# Patient Record
Sex: Male | Born: 1965 | Race: Black or African American | Hispanic: No | Marital: Married | Smoking: Current some day smoker
Health system: Southern US, Community
[De-identification: ages and names within clinical notes are randomized; demographics above are authoritative.]

## PROBLEM LIST (undated history)

## (undated) DIAGNOSIS — Z720 Tobacco use: Secondary | ICD-10-CM

## (undated) DIAGNOSIS — Z7901 Long term (current) use of anticoagulants: Secondary | ICD-10-CM

## (undated) DIAGNOSIS — I472 Ventricular tachycardia, unspecified: Secondary | ICD-10-CM

## (undated) DIAGNOSIS — Z95810 Presence of automatic (implantable) cardiac defibrillator: Secondary | ICD-10-CM

## (undated) DIAGNOSIS — F1010 Alcohol abuse, uncomplicated: Secondary | ICD-10-CM

## (undated) DIAGNOSIS — M109 Gout, unspecified: Secondary | ICD-10-CM

## (undated) DIAGNOSIS — I10 Essential (primary) hypertension: Secondary | ICD-10-CM

## (undated) DIAGNOSIS — I509 Heart failure, unspecified: Secondary | ICD-10-CM

## (undated) HISTORY — DX: Essential (primary) hypertension: I10

## (undated) HISTORY — DX: Ventricular tachycardia: I47.2

## (undated) HISTORY — DX: Ventricular tachycardia, unspecified: I47.20

## (undated) HISTORY — DX: Tobacco use: Z72.0

## (undated) HISTORY — DX: Presence of automatic (implantable) cardiac defibrillator: Z95.810

## (undated) HISTORY — DX: Alcohol abuse, uncomplicated: F10.10

---

## 1998-12-14 ENCOUNTER — Emergency Department (HOSPITAL_COMMUNITY): Admission: EM | Admit: 1998-12-14 | Discharge: 1998-12-14 | Payer: Self-pay | Admitting: Emergency Medicine

## 1998-12-26 ENCOUNTER — Emergency Department (HOSPITAL_COMMUNITY): Admission: EM | Admit: 1998-12-26 | Discharge: 1998-12-26 | Payer: Self-pay | Admitting: Emergency Medicine

## 2007-08-18 ENCOUNTER — Inpatient Hospital Stay (HOSPITAL_COMMUNITY): Admission: EM | Admit: 2007-08-18 | Discharge: 2007-08-28 | Payer: Self-pay | Admitting: Emergency Medicine

## 2007-08-18 ENCOUNTER — Ambulatory Visit: Payer: Self-pay | Admitting: Internal Medicine

## 2007-08-18 ENCOUNTER — Encounter (INDEPENDENT_AMBULATORY_CARE_PROVIDER_SITE_OTHER): Payer: Self-pay | Admitting: Emergency Medicine

## 2007-08-21 ENCOUNTER — Encounter: Payer: Self-pay | Admitting: Internal Medicine

## 2007-08-28 ENCOUNTER — Encounter: Payer: Self-pay | Admitting: Internal Medicine

## 2007-09-17 ENCOUNTER — Encounter: Payer: Self-pay | Admitting: Internal Medicine

## 2008-11-26 HISTORY — PX: CARDIAC DEFIBRILLATOR PLACEMENT: SHX171

## 2008-12-23 IMAGING — CT CT ANGIO CHEST
3 of 4 series · 19 of 29 positions shown · IV contrast (100 ML OMNI 300)
Comparison: none

CLINICAL DATA: Shortness of breath.  Elevated d-dimer.  
CT ANGIOGRAPHY OF CHEST:
TECHNIQUE: Multidetector CT imaging of the chest was performed during bolus injection of intravenous contrast.  Multiplanar CT angiographic image reconstructions were generated to evaluate the vascular anatomy.
Contrast:  100 cc Omnipaque 300

[Series 4: recon 2: pe · axial · 0.70mm/px · z∈[-230,-58]mm · 6 of 105 slices shown]
[im 18/105  lung]
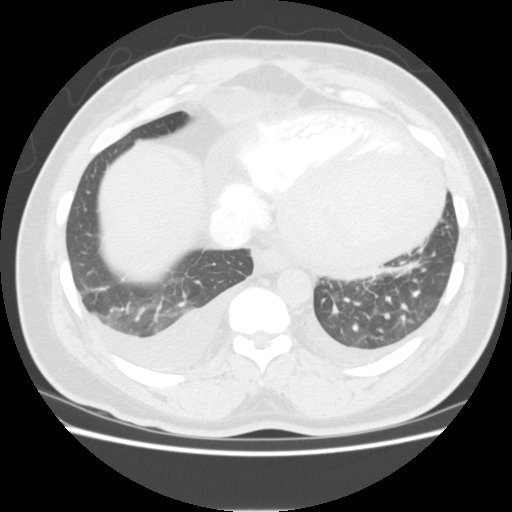
[im 35/105  mediastinal]
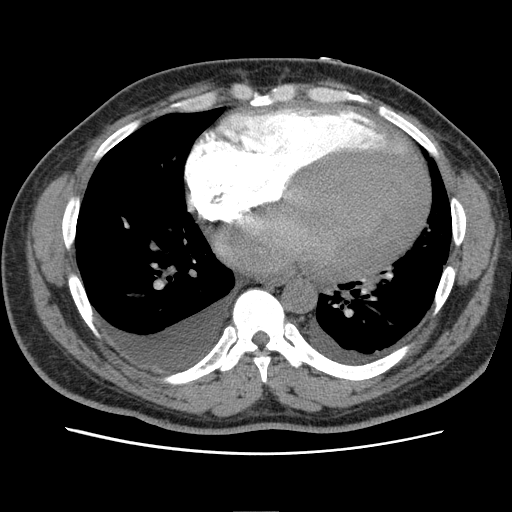
[im 53/105  lung]
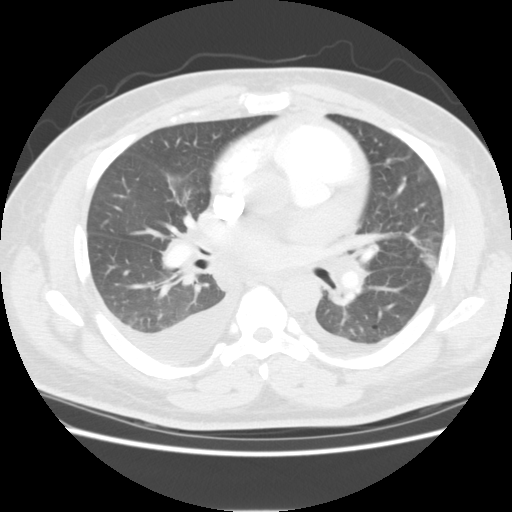
[im 54/105  mediastinal]
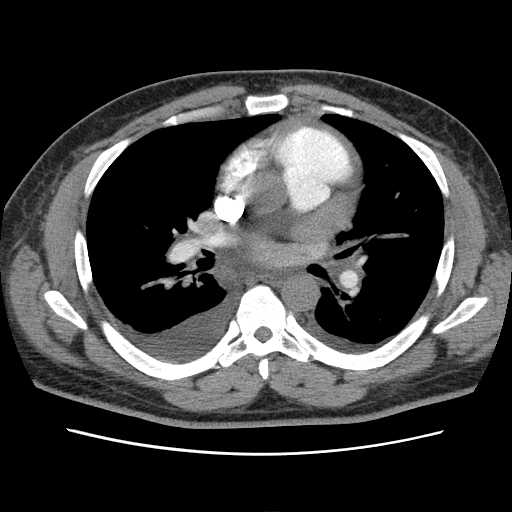
[im 70/105  lung]
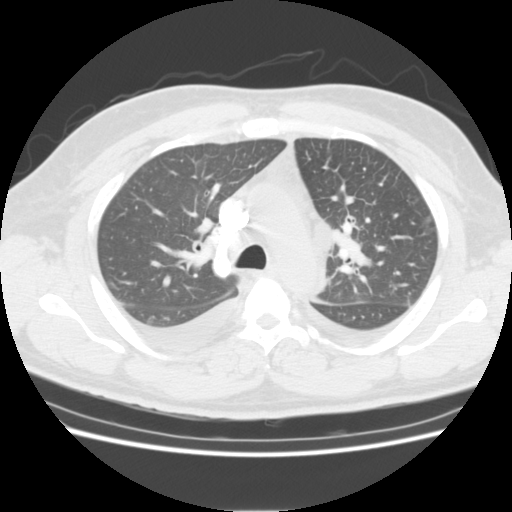
[im 87/105  mediastinal]
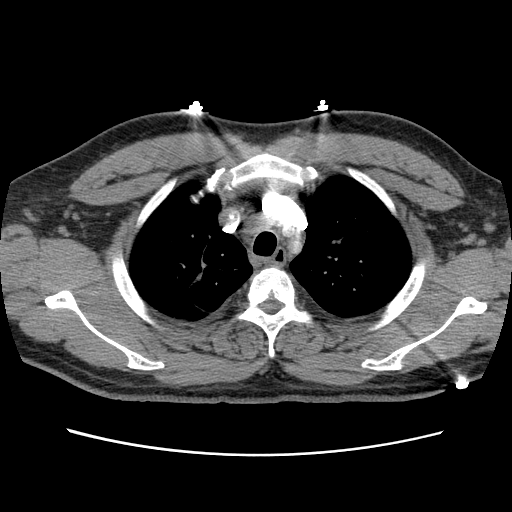

[Series 300: sag chest · sagittal · 0.66mm/px · 8 of 150 slices shown]
[im 15/150  lung]
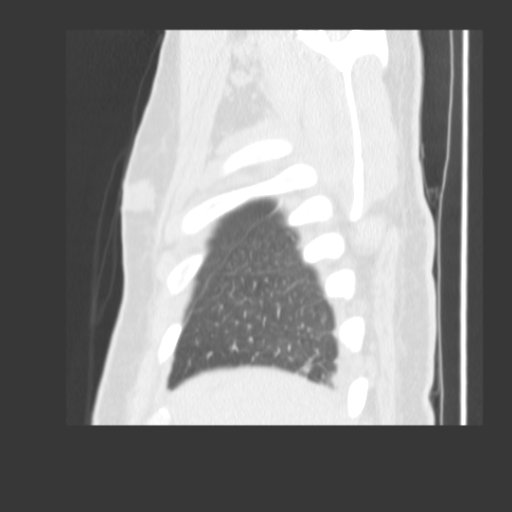
[im 30/150  lung]
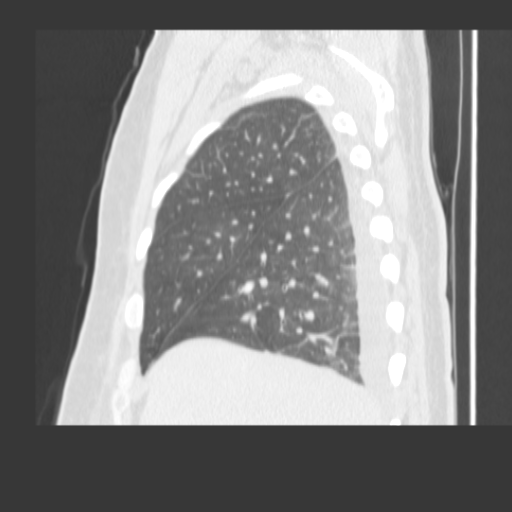
[im 45/150  lung]
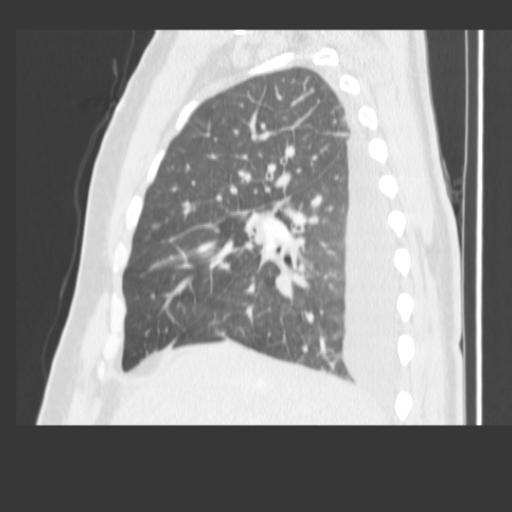
[im 60/150  lung]
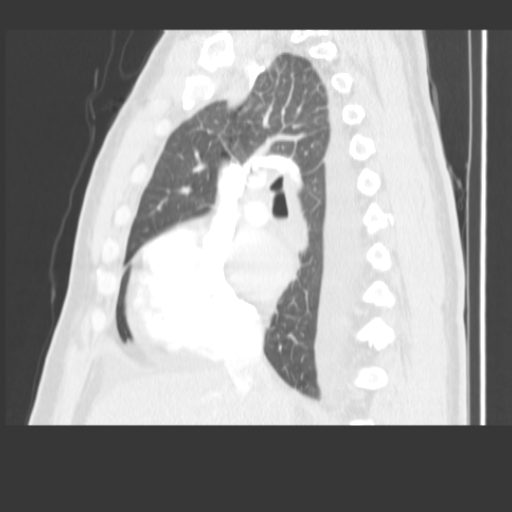
[im 90/150  lung]
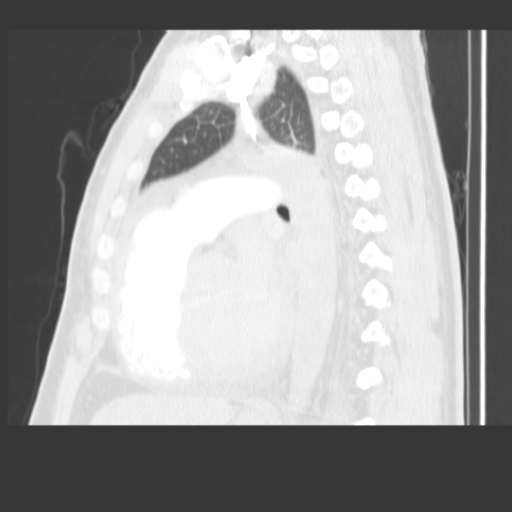
[im 105/150  lung]
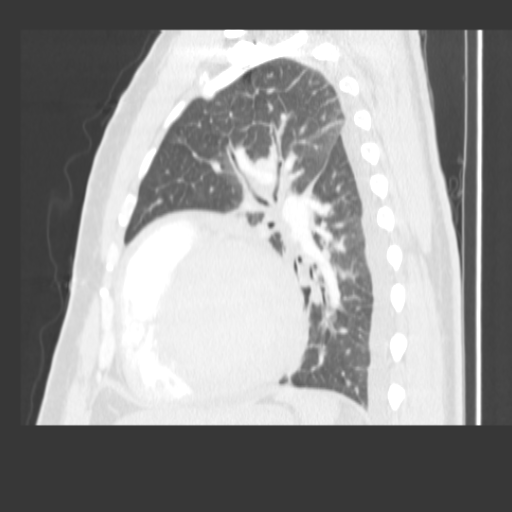
[im 120/150  lung]
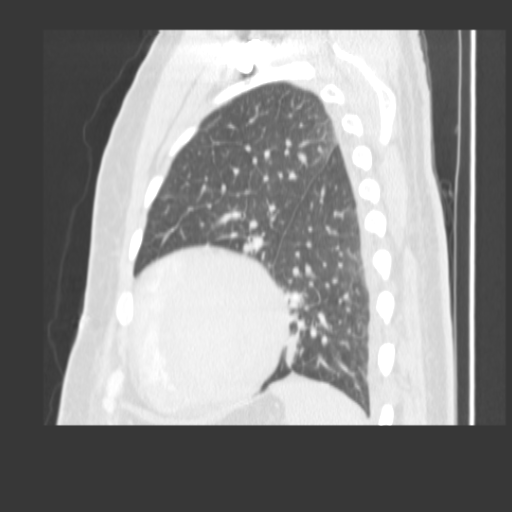
[im 135/150  lung]
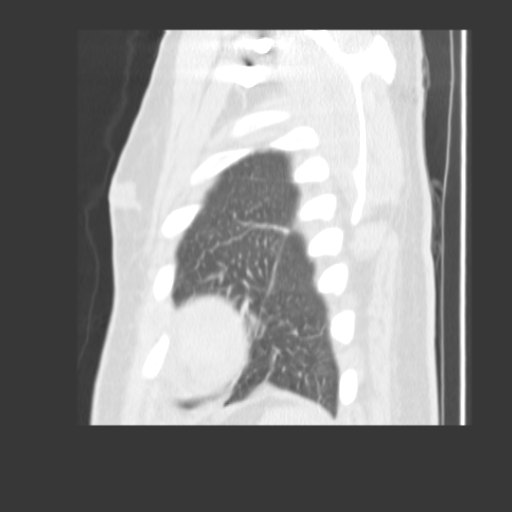

[Series 301: cor chestt · coronal · 0.66mm/px · 5 of 119 slices shown]
[im 15/119  lung]
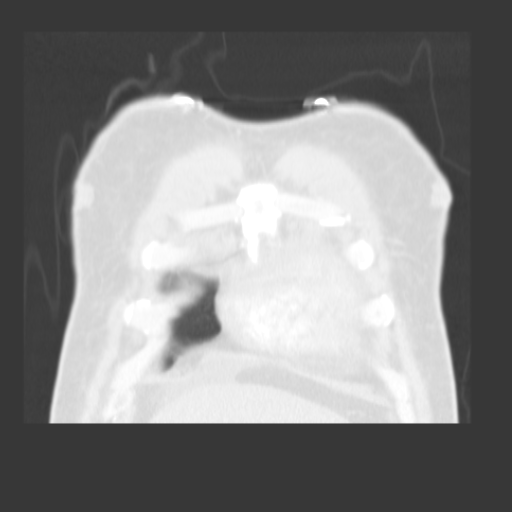
[im 30/119  lung]
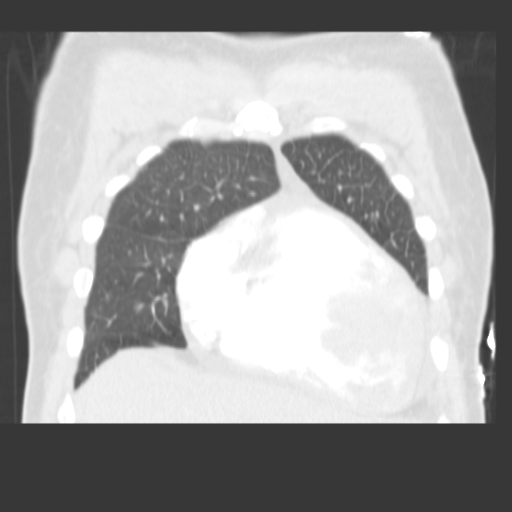
[im 45/119  lung]
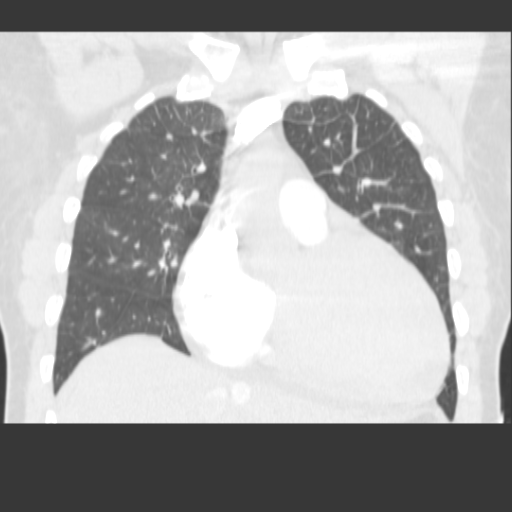
[im 60/119  lung]
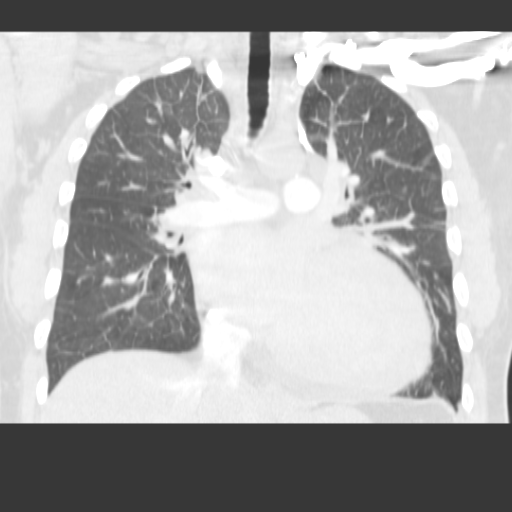
[im 74/119  lung]
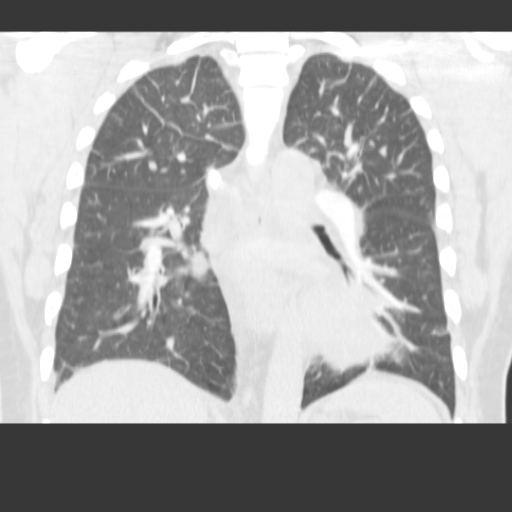

[19 of 29 positions shown; findings below may reference images not displayed]

FINDINGS: There is no CT evidence of pulmonary embolus.  The patient has small to moderate bilateral pleural effusions right greater than left.  No pericardial effusion is identified.  Marked cardiomegaly is noted.  There is gynecomastia.  The patient has some small mediastinal lymph nodes including a prevascular node on image 37 measuring 1.1 by approximately 2.0 cm.  There is also a precarinal lymph node on image 39 measuring 2.1 x 1.4 cm.  Right hilar lymph node measures 2.3 x 2.2 cm.  Lungs demonstrate patchy ground-glass attenuation with intralobular septal thickening consistent with interstitial pulmonary edema. No pulmonary nodule or mass is identified.  Infiltration of the subcutaneous fat diffusely is compatible with volume overload.  
Incidentally imaged upper abdomen demonstrates a small amount of perihepatic fluid.   Limited visualization of the upper abdomen is otherwise unremarkable.  Some contrast material is seen refluxing into the inferior vena cava suggesting elevated right heart pressures.  There is no focal bony abnormality.
IMPRESSION: 1.  Negative for pulmonary embolus.  
2.  Cardiomegaly, bilateral pleural effusions and pulmonary edema.  
3.  Small amount of perihepatic ascites cannot be definitively characterized.  
4.  Mediastinal and hilar lymph nodes are noted.   These may be reactive in nature but cannot be definitively characterized.  Question sarcoid or lymphoproliferative process.  
5.  Small amount of perihepatic ascites.  
6.  Gynecomastia.

## 2009-06-22 ENCOUNTER — Encounter: Payer: Self-pay | Admitting: Internal Medicine

## 2009-06-24 ENCOUNTER — Encounter: Payer: Self-pay | Admitting: Internal Medicine

## 2009-07-04 ENCOUNTER — Encounter: Payer: Self-pay | Admitting: Internal Medicine

## 2009-07-29 DIAGNOSIS — I10 Essential (primary) hypertension: Secondary | ICD-10-CM | POA: Insufficient documentation

## 2009-07-29 DIAGNOSIS — I472 Ventricular tachycardia: Secondary | ICD-10-CM

## 2009-07-29 DIAGNOSIS — F1021 Alcohol dependence, in remission: Secondary | ICD-10-CM | POA: Insufficient documentation

## 2009-07-29 DIAGNOSIS — I429 Cardiomyopathy, unspecified: Secondary | ICD-10-CM | POA: Insufficient documentation

## 2009-07-29 DIAGNOSIS — F17200 Nicotine dependence, unspecified, uncomplicated: Secondary | ICD-10-CM

## 2009-07-29 DIAGNOSIS — I509 Heart failure, unspecified: Secondary | ICD-10-CM | POA: Insufficient documentation

## 2009-07-29 DIAGNOSIS — I4891 Unspecified atrial fibrillation: Secondary | ICD-10-CM | POA: Insufficient documentation

## 2009-08-02 ENCOUNTER — Ambulatory Visit: Payer: Self-pay | Admitting: Internal Medicine

## 2009-08-11 ENCOUNTER — Ambulatory Visit: Payer: Self-pay | Admitting: Internal Medicine

## 2009-08-12 LAB — CONVERTED CEMR LAB
Basophils Absolute: 0.1 10*3/uL (ref 0.0–0.1)
Chloride: 108 meq/L (ref 96–112)
Creatinine, Ser: 1.2 mg/dL (ref 0.4–1.5)
Eosinophils Absolute: 0.2 10*3/uL (ref 0.0–0.7)
Glucose, Bld: 117 mg/dL — ABNORMAL HIGH (ref 70–99)
Hemoglobin: 14.3 g/dL (ref 13.0–17.0)
INR: 1.4 — ABNORMAL HIGH (ref 0.8–1.0)
Lymphocytes Relative: 29.6 % (ref 12.0–46.0)
Lymphs Abs: 1.8 10*3/uL (ref 0.7–4.0)
MCHC: 34.3 g/dL (ref 30.0–36.0)
Neutro Abs: 3.6 10*3/uL (ref 1.4–7.7)
Prothrombin Time: 14.7 s — ABNORMAL HIGH (ref 9.1–11.7)
RBC: 4.6 M/uL (ref 4.22–5.81)
RDW: 14.3 % (ref 11.5–14.6)
Sodium: 144 meq/L (ref 135–145)
WBC: 6.1 10*3/uL (ref 4.5–10.5)

## 2009-08-18 ENCOUNTER — Observation Stay (HOSPITAL_COMMUNITY): Admission: RE | Admit: 2009-08-18 | Discharge: 2009-08-19 | Payer: Self-pay | Admitting: Internal Medicine

## 2009-08-18 ENCOUNTER — Ambulatory Visit: Payer: Self-pay | Admitting: Internal Medicine

## 2009-08-19 ENCOUNTER — Encounter: Payer: Self-pay | Admitting: Internal Medicine

## 2009-08-25 ENCOUNTER — Encounter: Payer: Self-pay | Admitting: Internal Medicine

## 2009-08-31 ENCOUNTER — Ambulatory Visit: Payer: Self-pay

## 2009-08-31 ENCOUNTER — Ambulatory Visit: Payer: Self-pay | Admitting: Internal Medicine

## 2009-09-02 ENCOUNTER — Telehealth (INDEPENDENT_AMBULATORY_CARE_PROVIDER_SITE_OTHER): Payer: Self-pay | Admitting: *Deleted

## 2009-10-06 ENCOUNTER — Encounter (INDEPENDENT_AMBULATORY_CARE_PROVIDER_SITE_OTHER): Payer: Self-pay | Admitting: *Deleted

## 2009-10-11 ENCOUNTER — Encounter: Payer: Self-pay | Admitting: Internal Medicine

## 2009-11-15 ENCOUNTER — Inpatient Hospital Stay (HOSPITAL_COMMUNITY): Admission: EM | Admit: 2009-11-15 | Discharge: 2009-11-18 | Payer: Self-pay | Admitting: Emergency Medicine

## 2009-12-01 ENCOUNTER — Ambulatory Visit: Payer: Self-pay | Admitting: Internal Medicine

## 2009-12-01 ENCOUNTER — Encounter: Payer: Self-pay | Admitting: Internal Medicine

## 2009-12-13 ENCOUNTER — Telehealth: Payer: Self-pay | Admitting: Internal Medicine

## 2010-03-09 ENCOUNTER — Ambulatory Visit: Payer: Self-pay | Admitting: Internal Medicine

## 2010-07-11 ENCOUNTER — Inpatient Hospital Stay (HOSPITAL_COMMUNITY): Admission: EM | Admit: 2010-07-11 | Discharge: 2010-07-17 | Payer: Self-pay | Admitting: Emergency Medicine

## 2010-07-12 ENCOUNTER — Encounter (INDEPENDENT_AMBULATORY_CARE_PROVIDER_SITE_OTHER): Payer: Self-pay | Admitting: Cardiology

## 2010-07-17 ENCOUNTER — Encounter: Payer: Self-pay | Admitting: Internal Medicine

## 2010-07-21 ENCOUNTER — Encounter (INDEPENDENT_AMBULATORY_CARE_PROVIDER_SITE_OTHER): Payer: Self-pay | Admitting: *Deleted

## 2010-08-13 ENCOUNTER — Inpatient Hospital Stay (HOSPITAL_COMMUNITY): Admission: EM | Admit: 2010-08-13 | Discharge: 2010-08-18 | Payer: Self-pay | Admitting: Emergency Medicine

## 2010-09-14 ENCOUNTER — Ambulatory Visit: Payer: Self-pay | Admitting: Internal Medicine

## 2010-11-22 ENCOUNTER — Inpatient Hospital Stay (HOSPITAL_COMMUNITY)
Admission: EM | Admit: 2010-11-22 | Discharge: 2010-11-24 | Payer: Self-pay | Source: Home / Self Care | Attending: Cardiology | Admitting: Cardiology

## 2010-12-25 IMAGING — CR DG CHEST 2V
2 series · 2 of 2 positions shown · non-contrast
Comparison: 08/18/2007

CLINICAL DATA: Post ICD

CHEST - 2 VIEW

[w chest pa]
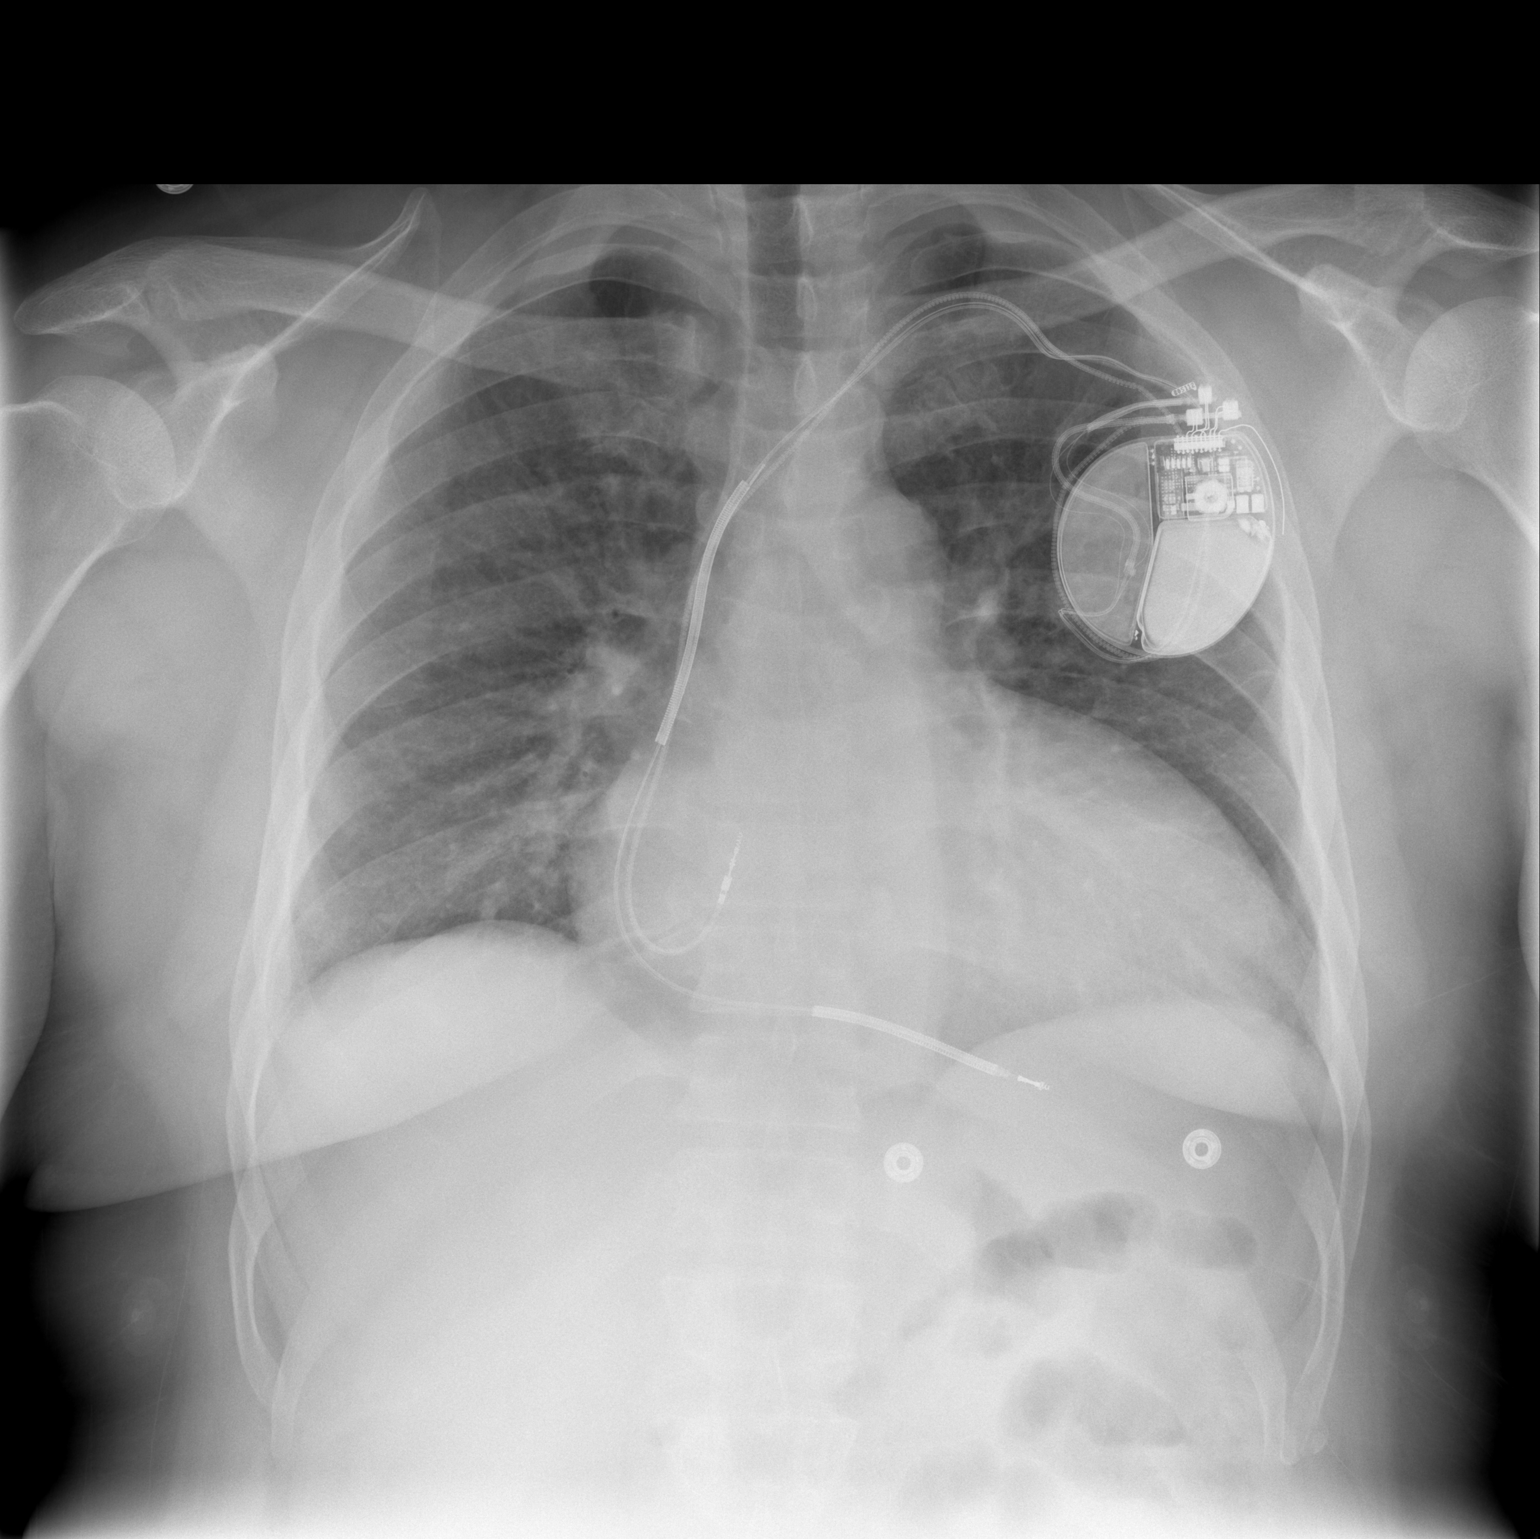

[w chest lat]
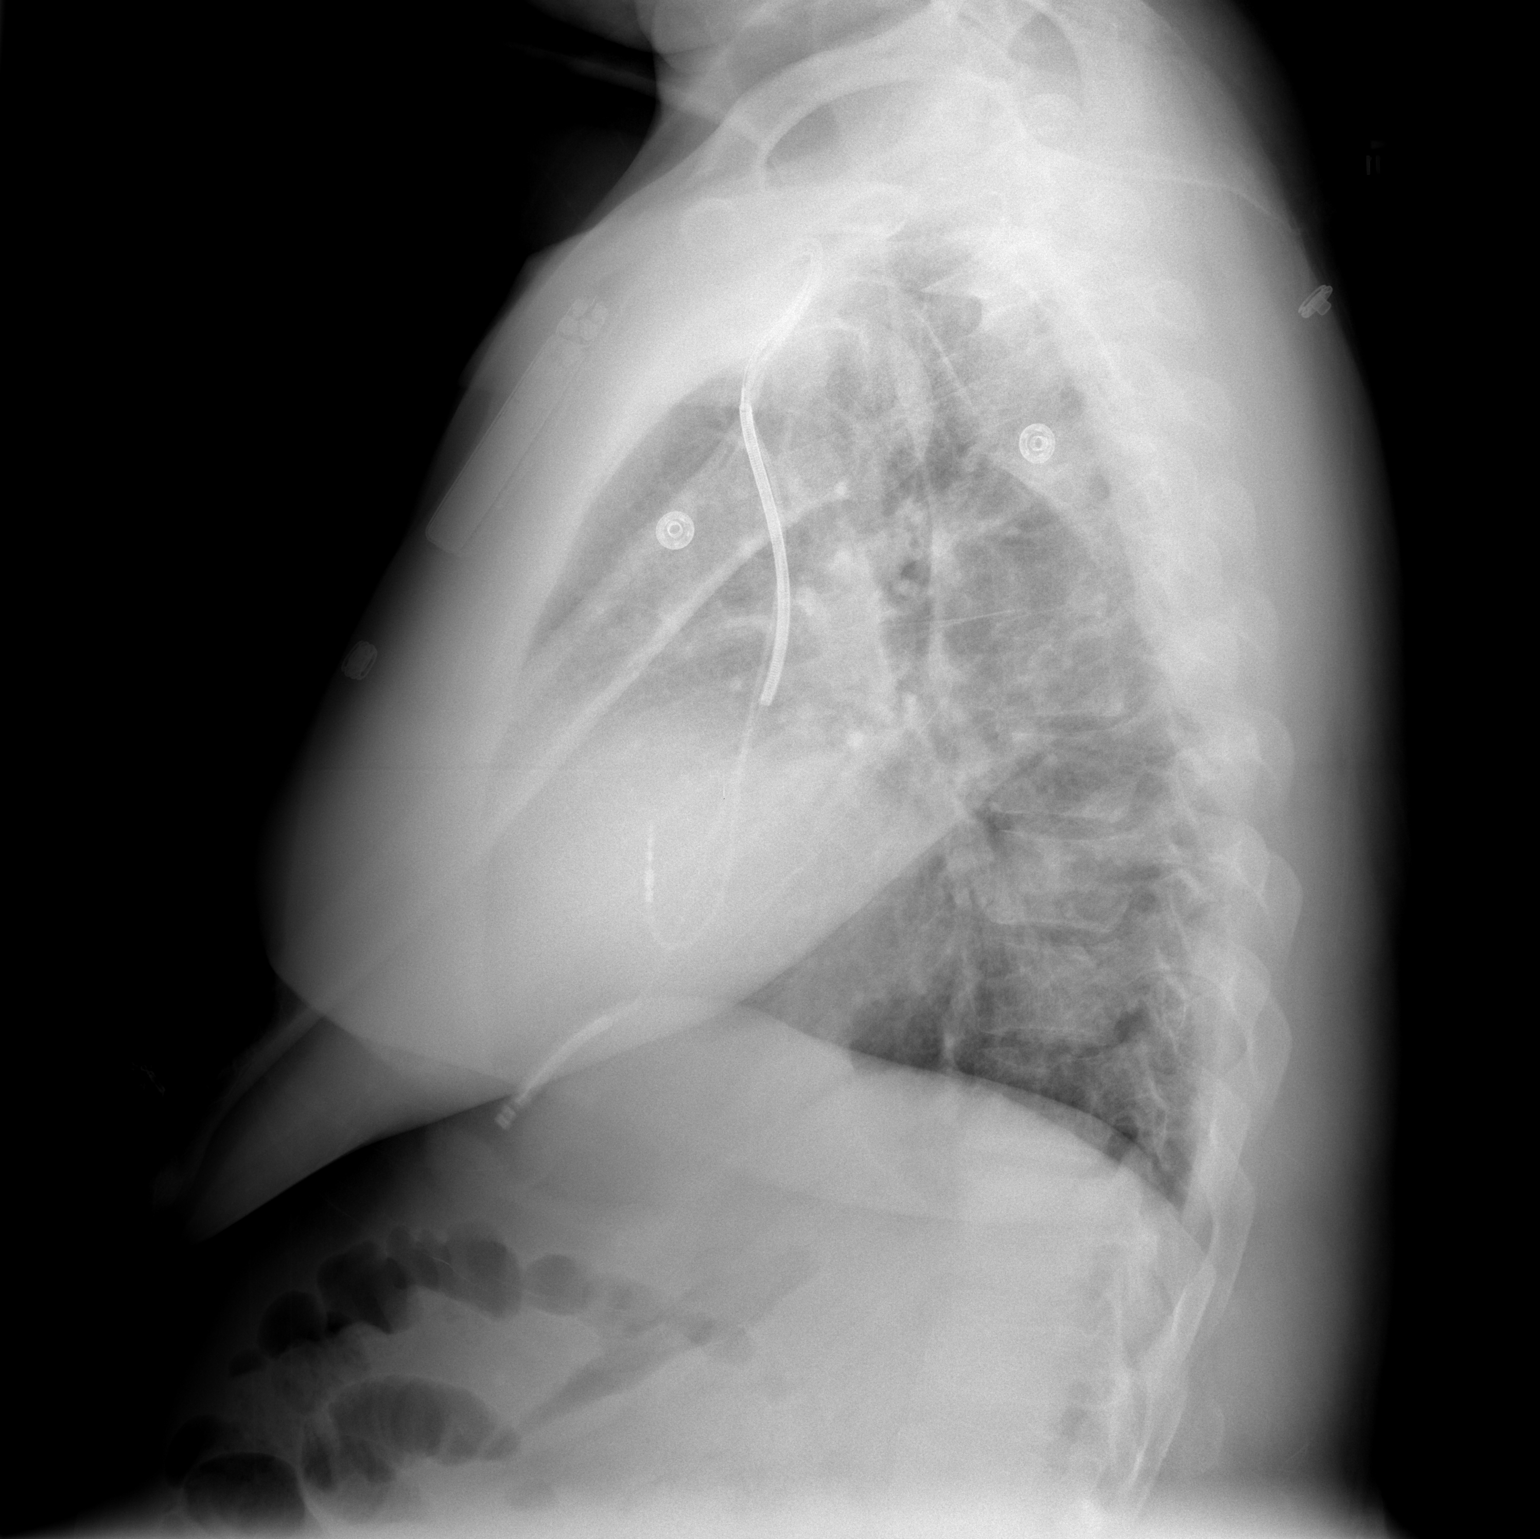

[2 of 2 positions shown; findings below may reference images not displayed]

FINDINGS: Cardiomegaly again noted.  There is a dual lead cardiac
pacemaker with left subclavian approach with leads in the right
atrium and right ventricle.  No diagnostic pneumothorax. No acute
infiltrate or edema.
IMPRESSION: No active disease.  Dual lead cardiac pacemaker in place.  No
diagnostic pneumothorax.

## 2010-12-26 NOTE — Letter (Signed)
Summary: [facility]   Imported By: Roderic Ovens 07/28/2010 15:58:16  _____________________________________________________________________  External Attachment:    Type:   Image     Comment:   External Document

## 2010-12-26 NOTE — Procedures (Signed)
Summary: [facility]   Allergies: No Known Drug Allergies   ICD Specifications Following MD:  Sherryl Manges, MD     ICD Vendor:  [facility] [employer]     ICD Model Number:  E1100     ICD Serial Number:  [id number] ICD DOI:  08/18/2009     ICD Implanting MD:  Sherryl Manges, MD  Lead 1:    Location: RA     DOI: 08/18/2009     Model #: [id number]     Serial #: [id number]     Status: active Lead 2:    Location: RV     DOI: 08/18/2009     Model #: [id number]     Serial #: [id number]     Status: active  Indications::  NICM   ICD Follow Up Remote Check?  No Charge Time:  8.4 seconds     Battery Est. Longevity:  8.5 years   ICD Device Measurements Atrium:  Amplitude: 6.0 mV, Impedance: 615 ohms, Threshold: 0.5 V at 0.4 msec Right Ventricle:  Amplitude: 25 mV, Impedance: 535 ohms, Threshold: 0.7 V at 0.4 msec Shock Impedance: 54 ohms   Episodes MS Episodes:  0     Percent Mode Switch:  0     Shock:  0     ATP:  1     Nonsustained:  15     Atrial Pacing:  0%     Ventricular Pacing:  0%  Brady Parameters Mode DDD     Lower Rate Limit:  40     Upper Rate Limit 120 PAV 250     Sensed AV Delay:  250  Tachy Zones VF:  200     VT:  170     Tech Comments:  No parameter changes.  Research visit.  Checked by [employer]. Altha Harm, LPN  March 09, 2010 4:12 PM

## 2010-12-26 NOTE — Letter (Signed)
**Note De-Identified  Obfuscation** Summary: Appointment - Missed  [facility], Main Office  [address]   [place], [place] [address]   Phone: [phone]  Fax: [phone]     July 21, 2010 MRN: [id number]   Parker Johnson [address] [place], [place]  [address]   Dear Parker Johnson,  Our records indicate you missed your appointment on  August 23rd, 2011, with Dr. Graciela Husbands. It is very important that we reach you to reschedule this appointment. We look forward to participating in your health care needs. Please contact us at the number listed above at your earliest convenience to reschedule this appointment.     Sincerely,    Glass blower/designer

## 2010-12-26 NOTE — Cardiovascular Report (Signed)
Summary: ICD Industry Check  ICD Industry Check   Imported By: Roderic Ovens 12/15/2009 15:39:15  _____________________________________________________________________  External Attachment:    Type:   Image     Comment:   External Document

## 2010-12-26 NOTE — Assessment & Plan Note (Signed)
Summary: rov/ gd   Visit Type:  rov Referring Provider:  Harwani  CC:  shortness of breath.  History of Present Illness: Parker Johnson is seen  in followup for ICD implantation  for  congestive heart failure and nonischemic cardiomyopathy.  He is part of the MADIT-R. IT trial     catheterization 2 years ago demonstrating no obstructive disease; recent echo cardiogram demonstrated ejection fraction of 15%. He has a narrow QRS also received a dual-chamber device without left ventricular resynchronization  The patient denies SOB, chest pain, edema or palpitations.  he was recently hospitalized for congestive heart failure his diuretic dose was adjusted.  He is on triple anticoagulant therapy for reasons that are not clear to me and not clear to the patient   Current Medications (verified): 1)  Coumadin 10 Mg Tabs (Warfarin Sodium) .... One Tab Once Taily 2)  Lisinopril 20 Mg Tabs (Lisinopril) .... One Tab Every Day 3)  Carvedilol 6.25 Mg Tabs (Carvedilol) .... One Tab Every 12 Hours 4)  Spironolactone 25 Mg Tabs (Spironolactone) .... One Tablet Two Times A Day 5)  Furosemide 40 Mg Tabs (Furosemide) .... Two Times A Day 6)  Plavix 75 Mg Tabs (Clopidogrel Bisulfate) .... Take One Tablet By Mouth Daily 7)  Aspirin Ec 325 Mg Tbec (Aspirin) .... Take One Tablet By Mouth Daily  Allergies (verified): No Known Drug Allergies  Past History:  Past Medical History: Last updated: 11/30/2009 VENTRICULAR TACHYCARDIA (ICD-427.1)  AICD PLACEMENT- [employer] TELIGEN 100 TOBACCO ABUSE (ICD-305.1) ALCOHOL ABUSE, HX OF (ICD-V11.3) CARDIOMYOPATHY, SECONDARY (ICD-425.9) HYPERTENSION, UNCONTROLLED (ICD-401.9)    Past Surgical History: Last updated: 11/30/2009 AICD Placement/2010-- [employer] TELIGEN 100  Family History: Last updated: 07/29/2009  His family history is notable for his father having a cardiomyopathy, on  a defibrillator. We do not know about his other siblings, including  two  brothers and a sister.      Social History: Last updated: 08/02/2009 His related medical history is notable for polysubstance abuse, with   heavy alcohol use even following his catheterization last summer, with a  significant downturn in frequency about 6 months ago.  There is also a  history of cocaine use that is not very recent.     Disabled  Single  did not finish the 12th grade but is interested in pursuing a GED  Vital Signs:  Patient profile:   45 year old male Height:      67 inches Weight:      193 pounds BMI:     30.34 Pulse rate:   93 / minute BP sitting:   102 / 79  (right arm) Cuff size:   large  Vitals Entered By: Caralee Ates CMA (September 14, 2010 3:24 PM)  Physical Exam  General:  The patient was alert and oriented in no acute distress. HEENT Normal.  Neck veins were flat, carotids were brisk.  Lungs were clear.  Heart sounds were regular without murmurs or gallops.  Abdomen was soft with active bowel sounds. There is no clubbing cyanosis or edema. Skin Warm and dry     ICD Specifications Following MD:  Sherryl Manges, MD     ICD Vendor:  [facility] [employer]     ICD Model Number:  E1100     ICD Serial Number:  [id number] ICD DOI:  08/18/2009     ICD Implanting MD:  Sherryl Manges, MD  Lead 1:    Location: RA     DOI: 08/18/2009  Model #: [id number]     Serial #: [id number]     Status: active Lead 2:    Location: RV     DOI: 08/18/2009     Model #: 2952     Serial #: [id number]     Status: active  Indications::  NICM   ICD Follow Up Battery Voltage:  BOL V     Charge Time:  8.5 seconds     Battery Est. Longevity:  8.5 YRS Underlying rhythm:  SR @ 90   ICD Device Measurements Atrium:  Amplitude: 4.7 mV, Impedance: 604 ohms, Threshold: 0.4 V at 0.4 msec Right Ventricle:  Amplitude: 25.0 mV, Impedance: 540 ohms, Threshold: 0.4 V at 0.4 msec Shock Impedance: 56 ohms   Episodes MS Episodes:  0     Shock:  0     ATP:  4     Nonsustained:  180     Atrial Pacing:   0     Ventricular Pacing:  0  Brady Parameters Mode DDD     Lower Rate Limit:  40     Upper Rate Limit 120 PAV 350     Sensed AV Delay:  350  Tachy Zones VF:  200     VT:  170     Next Cardiology Appt Due:  11/27/2010 Tech Comments:  MADIT RIT PT. CHECKED BY INDUSTRY.  4 EPISODES SUCCESSFULLY TERMINATED W/ATP THERAPY.  NORMAL DEVICE FUNCTION. NO CHANGES MADE. ROV IN 3 MTHS. Vella Kohler  September 14, 2010 3:23 PM  Impression & Recommendations:  Problem # 1:  IMPLANTATION OF DEFIBRILLATOR, BS TELIGEN 100 (ICD-V45.02) Device parameters and data were reviewed and no changes were made  Problem # 2:  VENTRICULAR TACHYCARDIA (ICD-427.1) the patient continues to have treated episodes of ventricular tachycardia successfully terminated with ATP His updated medication list for this problem includes:    Coumadin 10 Mg Tabs (Warfarin sodium) ..... One tab once taily    Lisinopril 20 Mg Tabs (Lisinopril) ..... One tab every day    Carvedilol 6.25 Mg Tabs (Carvedilol) ..... One tab every 12 hours    Plavix 75 Mg Tabs (Clopidogrel bisulfate) .Marland Kitchen... Take one tablet by mouth daily    Aspirin Ec 325 Mg Tbec (Aspirin) .Marland Kitchen... Take one tablet by mouth daily  Problem # 3:  PAROXYSMAL ATRIAL FIBRILLATION (ICD-427.31) the patient carries a diagnosis of atrial fibrillation which would explain The Coumadin. We don't have evidence of intercurrent episodes identified by his ICD His updated medication list for this problem includes:    Coumadin 10 Mg Tabs (Warfarin sodium) ..... One tab once taily    Carvedilol 6.25 Mg Tabs (Carvedilol) ..... One tab every 12 hours    Plavix 75 Mg Tabs (Clopidogrel bisulfate) .Marland Kitchen... Take one tablet by mouth daily    Aspirin Ec 325 Mg Tbec (Aspirin) .Marland Kitchen... Take one tablet by mouth daily  Problem # 4:  CARDIOMYOPATHY, SECONDARY (ICD-425.9) on appropriate therapy. The issue I understand is the aspirin and Plavix and Coumadin is understood in the context of the above His updated  medication list for this problem includes:    Coumadin 10 Mg Tabs (Warfarin sodium) ..... One tab once taily    Lisinopril 20 Mg Tabs (Lisinopril) ..... One tab every day    Carvedilol 6.25 Mg Tabs (Carvedilol) ..... One tab every 12 hours    Spironolactone 25 Mg Tabs (Spironolactone) ..... One tablet two times a day    Furosemide 40 Mg Tabs (Furosemide) .Marland Kitchen..Marland Kitchen Two times a  day    Plavix 75 Mg Tabs (Clopidogrel bisulfate) .Marland Kitchen... Take one tablet by mouth daily    Aspirin Ec 325 Mg Tbec (Aspirin) .Marland Kitchen... Take one tablet by mouth daily

## 2010-12-26 NOTE — Progress Notes (Signed)
Summary:  QUESTION ABOUT MEDICATIONS  Phone Note Call from Patient Call back at [phone]   Summary of Call: PT HAVE QUESTION ABOUT MEDICATION Initial call taken by: Judie Grieve,  December 13, 2009 1:31 PM  Follow-up for Phone Call        Pt wants Cialis or Viagra, pt aware Dr. Graciela Husbands is out of the office until tomorrow.  per pt calling back to get response on medication [phone] Lorne Skeens  December 14, 2009 2:12 PM  Follow-up by: Duncan Dull, RN, BSN,  December 13, 2009 2:11 PM  Additional Follow-up for Phone Call Additional follow up Details #1::        Per Dr. Graciela Husbands, he does not prescribe either of these and pt will need to s/w PCP. Additional Follow-up by: Duncan Dull, RN, BSN,  December 14, 2009 7:10 PM     Appended Document: **SK-Cialis/Viagra** QUESTION ABOUT MEDICATIONS Pt aware.

## 2010-12-26 NOTE — Assessment & Plan Note (Signed)
Summary: PC2/MB   Referring Provider:  Sharyn Lull  CC:  pc2/  .  History of Present Illness: Mr. Parker Johnson is seen  in followup for ICD implantation in a state of congestive heart failure and nonischemic cardiomyopathy.  He is part of the MADIT-R. IT trial     catheterization 2 years ago demonstrating no obstructive disease; recent echo cardiogram demonstrated ejection fraction of 15%. He has a narrow QRS also received a dual-chamber device without left ventricular resynchronization  The patient denies SOB, chest pain, edema or palpitations   Current Medications (verified): 1)  Coumadin 10 Mg Tabs (Warfarin Sodium) .... One Tab Once Taily 2)  Lisinopril 20 Mg Tabs (Lisinopril) .... One Tab Every Day 3)  Carvedilol 6.25 Mg Tabs (Carvedilol) .... One Tab Every 12 Hours 4)  Spironolactone 25 Mg Tabs (Spironolactone) .... One Tablet Two Times A Day 5)  Furosemide 40 Mg Tabs (Furosemide) .... One Tab Every Day  Allergies (verified): No Known Drug Allergies  Past History:  Past Medical History: Last updated: 11/30/2009 VENTRICULAR TACHYCARDIA (ICD-427.1)  AICD PLACEMENT- [employer] TELIGEN 100 TOBACCO ABUSE (ICD-305.1) ALCOHOL ABUSE, HX OF (ICD-V11.3) CARDIOMYOPATHY, SECONDARY (ICD-425.9) HYPERTENSION, UNCONTROLLED (ICD-401.9)    Past Surgical History: Last updated: 11/30/2009 AICD Placement/2010-- [employer] TELIGEN 100  Family History: Last updated: 07/29/2009  His family history is notable for his father having a cardiomyopathy, on  a defibrillator. We do not know about his other siblings, including two  brothers and a sister.      Social History: Last updated: 08/02/2009 His related medical history is notable for polysubstance abuse, with   heavy alcohol use even following his catheterization last summer, with a  significant downturn in frequency about 6 months ago.  There is also a  history of cocaine use that is not very recent.     Disabled  Single  did  not finish the 12th grade but is interested in pursuing a GED  Vital Signs:  Patient profile:   45 year old male Height:      67 inches Weight:      224 pounds BMI:     35.21 Pulse rate:   80 / minute Pulse rhythm:   regular BP sitting:   102 / 73  (right arm) Cuff size:   large  Vitals Entered By: Judithe Modest CMA (December 01, 2009 10:59 AM)  Physical Exam  General:  The patient was alert and oriented in no acute distress. HEENT Normal.  Neck veins were flat, carotids were brisk.  Lungs were clear.  Heart sounds were regular without murmurs or gallops.  Abdomen was soft with active bowel sounds. There is no clubbing cyanosis or edema. Skin Warm and dry     ICD Specifications Following MD:  Sherryl Manges, MD     ICD Vendor:  [facility] [employer]     ICD Model Number:  E1100     ICD Serial Number:  [id number] ICD DOI:  08/18/2009     ICD Implanting MD:  Sherryl Manges, MD  Lead 1:    Location: RA     DOI: 08/18/2009     Model #: [id number]     Serial #: [id number]     Status: active Lead 2:    Location: RV     DOI: 08/18/2009     Model #: 2440     Serial #: [id number]     Status: active  Indications::  NICM   Huston Foley Parameters Mode DDD  Lower Rate Limit:  40     Upper Rate Limit 120 PAV 250     Sensed AV Delay:  250  Tachy Zones VF:  200     VT:  170     Impression & Recommendations:  Problem # 1:  IMPLANTATION OF DEFIBRILLATOR, BS TELIGEN 100 (ICD-V45.02) s/p ICD implantation with enrollment in MADIT RIT;  there are repeated issues related to inappropriate atrial oversensing with mode switches and inappropriate therapy for sinus/atrial tachycardia.  We are reprogramming the device to avoid these issues  Problem # 2:  VENTRICULAR TACHYCARDIA (ICD-427.1) One nonsustained episode of  VT His updated medication list for this problem includes:    Coumadin 10 Mg Tabs (Warfarin sodium) ..... One tab once taily    Lisinopril 20 Mg Tabs (Lisinopril) ..... One tab every day    Carvedilol  6.25 Mg Tabs (Carvedilol) ..... One tab every 12 hours  Problem # 3:  PAROXYSMAL ATRIAL FIBRILLATION (ICD-427.31) NOne detected His updated medication list for this problem includes:    Coumadin 10 Mg Tabs (Warfarin sodium) ..... One tab once taily    Carvedilol 6.25 Mg Tabs (Carvedilol) ..... One tab every 12 hours  Problem # 4:  CARDIOMYOPATHY, SECONDARY (ICD-425.9) stable on excellent meds His updated medication list for this problem includes:    Coumadin 10 Mg Tabs (Warfarin sodium) ..... One tab once taily    Lisinopril 20 Mg Tabs (Lisinopril) ..... One tab every day    Carvedilol 6.25 Mg Tabs (Carvedilol) ..... One tab every 12 hours    Spironolactone 25 Mg Tabs (Spironolactone) ..... One tablet two times a day    Furosemide 40 Mg Tabs (Furosemide) ..... One tab every day  Patient Instructions: 1)  Your physician recommends that you schedule a follow-up appointment as determined by Research, Paula Compton

## 2011-01-03 NOTE — Discharge Summary (Signed)
  NAME:  Parker Johnson, MCISAAC NO.:  [id number]  MEDICAL RECORD NO.:  [id number]          PATIENT TYPE:  INP  LOCATION:  3709                         FACILITY:  [facility]  PHYSICIAN:  Osvaldo Shipper. Vincient Vanaman, M.D.DATE OF BIRTH:  1966-08-04  DATE OF ADMISSION:  11/22/2010 DATE OF DISCHARGE:  11/24/2010                              DISCHARGE SUMMARY   DISCHARGE DIAGNOSES: 1. Decompensated cardiomyopathy. 2. Congestive heart failure. 3. Acute bronchitis.  Mr. Parker Johnson is a 45 year old patient who was seen initially in the Emergency Department at the [facility] on November 22, 2010, with a complaint of shortness of breath.  The patient stated at that time that her problem had started approximately a week ago and had been gradually getting progressively worse.  He now has problems with increasing dyspnea and difficulty carrying out his activities of daily living.  It is of note that this patient has a history of congestive heart failure.  He has a history of hypertension and a history of coronary artery disease.  He has required an AICD implantation.  In the emergency department, the cardiac markers were negative.  The chest x-ray revealed stable cardiomegaly with chronic vascular congestion.  The electrolytes were within normal limits.  The BNP however was extremely elevated at 2986.  The patient was subsequently admitted for aggressive treatment of this particular problem.  The patient was admitted to the medical service.  He was placed on telemetry.  He was placed on fluid restrictions, strict intake and output.  The patient was treated with IV Lasix.  He was also seen by pharmacy for Lovenox protocol for prevention of DVT.  The dyspnea improved.  The patient was seen by pharmacy also for heart failure education program as well as the Art therapist.  On November 24, 2010, the dyspnea was improved.  The patient was clinically improved.  It was the opinion that  the patient had received maximum benefit from this hospitalization and could be discharged home.  MEDICATIONS AT DISCHARGE: 1. Cipro 250 mg twice daily. 2. Furosemide 40 mg 2 tablets twice daily. 3. Aspirin 81 mg daily. 4. Carvedilol 6.25 mg twice a day. 5. Coumadin 10 mg daily. 6. Lisinopril 20 mg daily. 7. Plavix 75 mg one daily. 8. Spironolactone 25 mg one daily.  The patient is to notify the physician immediately if any changes, problems, or concerns.     Ivery Quale, P.A.   ______________________________ Osvaldo Shipper. Marget Outten, M.D.    HB/MEDQ  D:  12/20/2010  T:  12/20/2010  Job:  [id number]  cc:   Osvaldo Shipper. Alexandru Moorer, M.D.  Electronically Signed by Thereasa Parkin. on 12/21/2010 08:00:43 AM Electronically Signed by Donia Guiles M.D. on 01/03/2011 07:24:39 PM

## 2011-01-15 ENCOUNTER — Encounter: Payer: Self-pay | Admitting: Internal Medicine

## 2011-01-15 ENCOUNTER — Other Ambulatory Visit: Payer: Self-pay | Admitting: Internal Medicine

## 2011-01-15 ENCOUNTER — Encounter (INDEPENDENT_AMBULATORY_CARE_PROVIDER_SITE_OTHER): Payer: Medicaid Other | Admitting: Internal Medicine

## 2011-01-15 ENCOUNTER — Encounter (INDEPENDENT_AMBULATORY_CARE_PROVIDER_SITE_OTHER): Payer: Medicaid Other

## 2011-01-15 DIAGNOSIS — I428 Other cardiomyopathies: Secondary | ICD-10-CM

## 2011-01-15 DIAGNOSIS — Z95810 Presence of automatic (implantable) cardiac defibrillator: Secondary | ICD-10-CM

## 2011-01-15 DIAGNOSIS — R0989 Other specified symptoms and signs involving the circulatory and respiratory systems: Secondary | ICD-10-CM

## 2011-01-15 DIAGNOSIS — I472 Ventricular tachycardia: Secondary | ICD-10-CM

## 2011-01-15 DIAGNOSIS — I4891 Unspecified atrial fibrillation: Secondary | ICD-10-CM

## 2011-01-15 DIAGNOSIS — I509 Heart failure, unspecified: Secondary | ICD-10-CM

## 2011-01-16 LAB — BASIC METABOLIC PANEL
Calcium: 9.2 mg/dL (ref 8.4–10.5)
GFR: 73.13 mL/min (ref >60.00)

## 2011-01-16 LAB — MAGNESIUM: Magnesium: 2 mg/dL (ref 1.5–2.5)

## 2011-01-23 NOTE — Assessment & Plan Note (Signed)
Summary: DF2/[employer]/PER PT CALL/LG   Referring Provider:  Sharyn Lull   History of Present Illness: Mr. Barnette is seen  in followup for ICD implantation  for  congestive heart failure and nonischemic cardiomyopathy.  He is part of the MADIT-R. IT trial     catheterization 2 years ago demonstrating no obstructive disease; recent echo cardiogram demonstrated ejection fraction of 15%. He has a narrow QRS also received a dual-chamber device without left ventricular resynchronization  He was recently hospitalized for congestive heart failure. His records were reviewed.  He is doing relatively well. He denies palpitations. His bleeding status is poor but stable.  Current Medications (verified): 1)  Coumadin 10 Mg Tabs (Warfarin Sodium) .... One Tab Once Daily 2)  Lisinopril 20 Mg Tabs (Lisinopril) .... One Tab Every Day 3)  Carvedilol 6.25 Mg Tabs (Carvedilol) .... One Tab Every 12 Hours 4)  Spironolactone 25 Mg Tabs (Spironolactone) .... One Tablet Two Times A Day 5)  Furosemide 40 Mg Tabs (Furosemide) .... Two Times A Day 6)  Plavix 75 Mg Tabs (Clopidogrel Bisulfate) .... Two Times A Day  Allergies (verified): No Known Drug Allergies  Past History:  Past Medical History: Last updated: 11/30/2009 VENTRICULAR TACHYCARDIA (ICD-427.1)  AICD PLACEMENT- [employer] TELIGEN 100 TOBACCO ABUSE (ICD-305.1) ALCOHOL ABUSE, HX OF (ICD-V11.3) CARDIOMYOPATHY, SECONDARY (ICD-425.9) HYPERTENSION, UNCONTROLLED (ICD-401.9)    Past Surgical History: Last updated: 11/30/2009 AICD Placement/2010-- [employer] TELIGEN 100  Family History: Last updated: 07/29/2009  His family history is notable for his father having a cardiomyopathy, on  a defibrillator. We do not know about his other siblings, including two  brothers and a sister.      Social History: Last updated: 08/02/2009 His related medical history is notable for polysubstance abuse, with   heavy alcohol use even  following his catheterization last summer, with a  significant downturn in frequency about 6 months ago.  There is also a  history of cocaine use that is not very recent.     Disabled  Single  did not finish the 12th grade but is interested in pursuing a GED  Vital Signs:  Patient profile:   45 year old male Height:      67 inches Weight:      186 pounds BMI:     29.24 Pulse rate:   100 / minute Pulse rhythm:   regular BP sitting:   100 / 78  (left arm) Cuff size:   regular  Vitals Entered By: Judithe Modest CMA (January 15, 2011 3:34 PM)  Physical Exam  General:  The patient was alert and oriented in no acute distress. HEENT Normal.  Neck veins were 7-8, carotids were brisk.  Lungs were clear.  Heart sounds were regular with 2/6 murmur and a displaced PMI Abdomen was soft with active bowel sounds. There is no clubbing cyanosis or edema. Skin Warm and dry     ICD Specifications Following MD:  Sherryl Manges, MD     ICD Vendor:  [facility] [employer]     ICD Model Number:  E1100     ICD Serial Number:  [id number] ICD DOI:  08/18/2009     ICD Implanting MD:  Sherryl Manges, MD  Lead 1:    Location: RA     DOI: 08/18/2009     Model #: [id number]     Serial #: [id number]     Status: active Lead 2:    Location: RV     DOI: 08/18/2009     Model #:  [id number]     Serial #: [id number]     Status: active  Indications::  NICM   Brady Parameters Mode DDD     Lower Rate Limit:  40     Upper Rate Limit 120 PAV 350     Sensed AV Delay:  350  Tachy Zones VF:  200     VT:  170     Impression & Recommendations:  Problem # 1:  VENTRICULAR TACHYCARDIA (ICD-427.1) The patient has had recurrent episodes of ventricular tachycardia; each is the terminated by ATP. There are 3 different morphologies ranging a cycle length from 330-250 ms We will check his metabolic profile as well as a BNP today; also his magnesium  Problem # 2:  IMPLANTATION OF DEFIBRILLATOR, BS TELIGEN 100 (ICD-V45.02) Device parameters and  data were reviewed and no changes were made;  he is part of the MADIT R. IT trial  Problem # 3:  PAROXYSMAL ATRIAL FIBRILLATION (ICD-427.31) he is on Coumadin. His aspirin has been discontinued. I've asked him to call Dr. Sharyn Lull is why he is on Plavix. Should probably be stopped The following medications were removed from the medication list:    Plavix 75 Mg Tabs (Clopidogrel bisulfate) .Marland Kitchen... Take one tablet by mouth daily    Aspirin Ec 325 Mg Tbec (Aspirin) .Marland Kitchen... Take one tablet by mouth daily His updated medication list for this problem includes:    Coumadin 10 Mg Tabs (Warfarin sodium) ..... One tab once daily    Carvedilol 12.5 Mg Tabs (Carvedilol) .Marland Kitchen... 1 two times a day    Plavix 75 Mg Tabs (Clopidogrel bisulfate) .Marland Kitchen... 1 once daily  Orders: TLB-Magnesium (Mg) (83735-MG)  The following medications were removed from the medication list:    Plavix 75 Mg Tabs (Clopidogrel bisulfate) .Marland Kitchen... Take one tablet by mouth daily    Aspirin Ec 325 Mg Tbec (Aspirin) .Marland Kitchen... Take one tablet by mouth daily His updated medication list for this problem includes:    Coumadin 10 Mg Tabs (Warfarin sodium) ..... One tab once daily    Carvedilol 6.25 Mg Tabs (Carvedilol) ..... One tab every 12 hours    Plavix 75 Mg Tabs (Clopidogrel bisulfate) .Marland Kitchen... 1 once daily  Problem # 4:  CARDIOMYOPATHY, SECONDARY (ICD-425.9)  on appropriate medications; we will decrease his spironolactone and double his carvedilol to help with  heart failure  The following medications were removed from the medication list:    Plavix 75 Mg Tabs (Clopidogrel bisulfate) .Marland Kitchen... Take one tablet by mouth daily    Aspirin Ec 325 Mg Tbec (Aspirin) .Marland Kitchen... Take one tablet by mouth daily His updated medication list for this problem includes:    Coumadin 10 Mg Tabs (Warfarin sodium) ..... One tab once daily    Lisinopril 20 Mg Tabs (Lisinopril) ..... One tab every day    Carvedilol 12.5 Mg Tabs (Carvedilol) .Marland Kitchen... 1 two times a day     Spironolactone 25 Mg Tabs (Spironolactone) .Marland Kitchen... 1 once daily    Furosemide 40 Mg Tabs (Furosemide) .Marland Kitchen..Marland Kitchen Two times a day    Plavix 75 Mg Tabs (Clopidogrel bisulfate) .Marland Kitchen... 1 once daily  Other Orders: TLB-BMP (Basic Metabolic Panel-BMET) (80048-METABOL) TLB-BNP (B-Natriuretic Peptide) (83880-BNPR)  Patient Instructions: 1)  Your physician recommends that you schedule a follow-up appointment in: 2 MONTHS WITH DR Graciela Husbands 2)  Your physician recommends that you return for lab work ZO:XWRU BNP MAG 428.0 427.31 3)  Your physician has recommended you make the following change in your medication: PLAVIX 75 MG 1  once daily  4)  SPIRONOLACTONE 25 MG 1 once daily  5)  CARVEDILOL 12.5 MG 1 two times a day  Prescriptions: CARVEDILOL 12.5 MG TABS (CARVEDILOL) 1 two times a day  #60 x 11   Entered by:   Scherrie Bateman, LPN   Authorized by:   Nathen May, MD, [facility]   Signed by:   Scherrie Bateman, LPN on 16/08/9603   Method used:   Electronically to        [facility] [phone]* (retail)       [address]       [place], [place]  [address]       Ph: [phone]       Fax: [phone]   RxID:   [id number]

## 2011-01-29 ENCOUNTER — Other Ambulatory Visit: Payer: Medicaid Other

## 2011-02-01 NOTE — Cardiovascular Report (Signed)
Summary: Office Visit   Office Visit   Imported By: Roderic Ovens 01/25/2011 16:09:12  _____________________________________________________________________  External Attachment:    Type:   Image     Comment:   External Document

## 2011-02-05 LAB — CARDIAC PANEL(CRET KIN+CKTOT+MB+TROPI)
CK, MB: 0.7 ng/mL (ref 0.3–4.0)
Relative Index: INVALID (ref 0.0–2.5)
Total CK: 72 U/L (ref 7–232)
Troponin I: 0.28 ng/mL — ABNORMAL HIGH (ref 0.00–0.06)
Troponin I: 0.28 ng/mL — ABNORMAL HIGH (ref 0.00–0.06)

## 2011-02-05 LAB — BASIC METABOLIC PANEL
CO2: 27 mEq/L (ref 19–32)
CO2: 29 mEq/L (ref 19–32)
CO2: 30 mEq/L (ref 19–32)
Calcium: 8.9 mg/dL (ref 8.4–10.5)
Calcium: 9 mg/dL (ref 8.4–10.5)
Calcium: 9 mg/dL (ref 8.4–10.5)
Chloride: 100 mEq/L (ref 96–112)
Chloride: 105 mEq/L (ref 96–112)
Creatinine, Ser: 1.25 mg/dL (ref 0.4–1.5)
Creatinine, Ser: 1.31 mg/dL (ref 0.4–1.5)
Creatinine, Ser: 1.37 mg/dL (ref 0.4–1.5)
GFR calc Af Amer: >60 mL/min (ref >60)
GFR calc Af Amer: >60 mL/min (ref >60)
GFR calc Af Amer: >60 mL/min (ref >60)
GFR calc non Af Amer: 56 mL/min — ABNORMAL LOW (ref >60)
GFR calc non Af Amer: 59 mL/min — ABNORMAL LOW (ref >60)
GFR calc non Af Amer: >60 mL/min (ref >60)
Glucose, Bld: 103 mg/dL — ABNORMAL HIGH (ref 70–99)
Glucose, Bld: 188 mg/dL — ABNORMAL HIGH (ref 70–99)
Potassium: 4.4 mEq/L (ref 3.5–5.1)
Sodium: 139 mEq/L (ref 135–145)
Sodium: 140 mEq/L (ref 135–145)
Sodium: 141 mEq/L (ref 135–145)
Sodium: 143 mEq/L (ref 135–145)

## 2011-02-05 LAB — POCT CARDIAC MARKERS
CKMB, poc: <1 ng/mL — ABNORMAL LOW (ref 1.0–8.0)
Troponin i, poc: <0.05 ng/mL (ref 0.00–0.09)

## 2011-02-05 LAB — CBC
HCT: 38.8 % — ABNORMAL LOW (ref 39.0–52.0)
HCT: 39.1 % (ref 39.0–52.0)
Hemoglobin: 12.6 g/dL — ABNORMAL LOW (ref 13.0–17.0)
MCH: 26.9 pg (ref 26.0–34.0)
MCHC: 32.5 g/dL (ref 30.0–36.0)
MCV: 82.9 fL (ref 78.0–100.0)
MCV: 83.5 fL (ref 78.0–100.0)
Platelets: 308 10*3/uL (ref 150–400)
RBC: 4.68 MIL/uL (ref 4.22–5.81)
WBC: 7.4 10*3/uL (ref 4.0–10.5)

## 2011-02-05 LAB — DIFFERENTIAL
Basophils Relative: 1 % (ref 0–1)
Eosinophils Relative: 1 % (ref 0–5)
Neutro Abs: 5.1 10*3/uL (ref 1.7–7.7)
Neutrophils Relative %: 68 % (ref 43–77)

## 2011-02-05 LAB — CK TOTAL AND CKMB (NOT AT ARMC)
Relative Index: 1 (ref 0.0–2.5)
Total CK: 105 U/L (ref 7–232)

## 2011-02-05 LAB — BRAIN NATRIURETIC PEPTIDE: Pro B Natriuretic peptide (BNP): 2986 pg/mL — ABNORMAL HIGH (ref 0.0–100.0)

## 2011-02-05 LAB — PROTIME-INR
INR: 1.75 — ABNORMAL HIGH (ref 0.00–1.49)
INR: 1.91 — ABNORMAL HIGH (ref 0.00–1.49)

## 2011-02-08 LAB — DIFFERENTIAL
Basophils Absolute: 0 10*3/uL (ref 0.0–0.1)
Basophils Relative: 1 % (ref 0–1)
Eosinophils Absolute: 0.1 10*3/uL (ref 0.0–0.7)
Monocytes Absolute: 0.4 10*3/uL (ref 0.1–1.0)
Monocytes Relative: 5 % (ref 3–12)
Neutro Abs: 4.3 10*3/uL (ref 1.7–7.7)
Neutrophils Relative %: 57 % (ref 43–77)

## 2011-02-08 LAB — CBC
HCT: 35.6 % — ABNORMAL LOW (ref 39.0–52.0)
HCT: 36.7 % — ABNORMAL LOW (ref 39.0–52.0)
Hemoglobin: 11.8 g/dL — ABNORMAL LOW (ref 13.0–17.0)
Hemoglobin: 12 g/dL — ABNORMAL LOW (ref 13.0–17.0)
Hemoglobin: 12.2 g/dL — ABNORMAL LOW (ref 13.0–17.0)
Hemoglobin: 13.1 g/dL (ref 13.0–17.0)
MCH: 26.5 pg (ref 26.0–34.0)
MCH: 27.5 pg (ref 26.0–34.0)
MCHC: 32.8 g/dL (ref 30.0–36.0)
MCHC: 33 g/dL (ref 30.0–36.0)
MCHC: 33.1 g/dL (ref 30.0–36.0)
MCV: 81 fL (ref 78.0–100.0)
MCV: 82.7 fL (ref 78.0–100.0)
MCV: 83.4 fL (ref 78.0–100.0)
Platelets: 282 10*3/uL (ref 150–400)
Platelets: 285 10*3/uL (ref 150–400)
Platelets: 287 10*3/uL (ref 150–400)
RBC: 4.22 MIL/uL (ref 4.22–5.81)
RBC: 4.4 MIL/uL (ref 4.22–5.81)
RBC: 4.53 MIL/uL (ref 4.22–5.81)
RBC: 4.94 MIL/uL (ref 4.22–5.81)
RDW: 17.7 % — ABNORMAL HIGH (ref 11.5–15.5)
RDW: 18.3 % — ABNORMAL HIGH (ref 11.5–15.5)
WBC: 6.5 10*3/uL (ref 4.0–10.5)
WBC: 6.7 10*3/uL (ref 4.0–10.5)
WBC: 7.3 10*3/uL (ref 4.0–10.5)

## 2011-02-08 LAB — URINALYSIS, ROUTINE W REFLEX MICROSCOPIC
Glucose, UA: NEGATIVE mg/dL
Leukocytes, UA: NEGATIVE
Protein, ur: 100 mg/dL — AB
Specific Gravity, Urine: 1.017 (ref 1.005–1.030)
pH: 5 (ref 5.0–8.0)

## 2011-02-08 LAB — HEPARIN LEVEL (UNFRACTIONATED)
Heparin Unfractionated: 0.1 IU/mL — ABNORMAL LOW (ref 0.30–0.70)
Heparin Unfractionated: 0.32 IU/mL (ref 0.30–0.70)
Heparin Unfractionated: 0.5 IU/mL (ref 0.30–0.70)
Heparin Unfractionated: 0.59 IU/mL (ref 0.30–0.70)

## 2011-02-08 LAB — BASIC METABOLIC PANEL
BUN: 14 mg/dL (ref 6–23)
BUN: 15 mg/dL (ref 6–23)
BUN: 15 mg/dL (ref 6–23)
CO2: 24 mEq/L (ref 19–32)
CO2: 25 mEq/L (ref 19–32)
Calcium: 8 mg/dL — ABNORMAL LOW (ref 8.4–10.5)
Calcium: 8.7 mg/dL (ref 8.4–10.5)
Chloride: 102 mEq/L (ref 96–112)
Chloride: 104 mEq/L (ref 96–112)
Chloride: 105 mEq/L (ref 96–112)
Creatinine, Ser: 1.21 mg/dL (ref 0.4–1.5)
Creatinine, Ser: 1.27 mg/dL (ref 0.4–1.5)
Creatinine, Ser: 1.39 mg/dL (ref 0.4–1.5)
GFR calc Af Amer: >60 mL/min (ref >60)
GFR calc Af Amer: >60 mL/min (ref >60)
GFR calc non Af Amer: 56 mL/min — ABNORMAL LOW (ref >60)
GFR calc non Af Amer: >60 mL/min (ref >60)
Glucose, Bld: 106 mg/dL — ABNORMAL HIGH (ref 70–99)
Glucose, Bld: 149 mg/dL — ABNORMAL HIGH (ref 70–99)
Glucose, Bld: 157 mg/dL — ABNORMAL HIGH (ref 70–99)
Potassium: 3.8 mEq/L (ref 3.5–5.1)
Sodium: 134 mEq/L — ABNORMAL LOW (ref 135–145)
Sodium: 138 mEq/L (ref 135–145)
Sodium: 140 mEq/L (ref 135–145)
Sodium: 140 mEq/L (ref 135–145)

## 2011-02-08 LAB — POCT I-STAT, CHEM 8
Calcium, Ion: 1.05 mmol/L — ABNORMAL LOW (ref 1.12–1.32)
Glucose, Bld: 105 mg/dL — ABNORMAL HIGH (ref 70–99)
HCT: 40 % (ref 39.0–52.0)
Hemoglobin: 13.6 g/dL (ref 13.0–17.0)

## 2011-02-08 LAB — PROTIME-INR
INR: 1.39 (ref 0.00–1.49)
INR: 1.62 — ABNORMAL HIGH (ref 0.00–1.49)
INR: 1.88 — ABNORMAL HIGH (ref 0.00–1.49)

## 2011-02-08 LAB — CARDIAC PANEL(CRET KIN+CKTOT+MB+TROPI)
Relative Index: INVALID (ref 0.0–2.5)
Total CK: 73 U/L (ref 7–232)
Troponin I: 0.11 ng/mL — ABNORMAL HIGH (ref 0.00–0.06)

## 2011-02-08 LAB — MAGNESIUM: Magnesium: 2 mg/dL (ref 1.5–2.5)

## 2011-02-08 LAB — URINE MICROSCOPIC-ADD ON

## 2011-02-08 LAB — BRAIN NATRIURETIC PEPTIDE
Pro B Natriuretic peptide (BNP): 1277 pg/mL — ABNORMAL HIGH (ref 0.0–100.0)
Pro B Natriuretic peptide (BNP): 1694 pg/mL — ABNORMAL HIGH (ref 0.0–100.0)
Pro B Natriuretic peptide (BNP): 2396 pg/mL — ABNORMAL HIGH (ref 0.0–100.0)
Pro B Natriuretic peptide (BNP): 3021 pg/mL — ABNORMAL HIGH (ref 0.0–100.0)
Pro B Natriuretic peptide (BNP): 784 pg/mL — ABNORMAL HIGH (ref 0.0–100.0)

## 2011-02-08 LAB — LIPID PANEL
Cholesterol: 111 mg/dL (ref 0–200)
HDL: 22 mg/dL — ABNORMAL LOW (ref >39)
LDL Cholesterol: 74 mg/dL (ref 0–99)

## 2011-02-08 LAB — CK TOTAL AND CKMB (NOT AT ARMC): CK, MB: 0.7 ng/mL (ref 0.3–4.0)

## 2011-02-08 LAB — POCT CARDIAC MARKERS

## 2011-02-09 LAB — CARDIAC PANEL(CRET KIN+CKTOT+MB+TROPI)
CK, MB: 0.8 ng/mL (ref 0.3–4.0)
CK, MB: 3.4 ng/mL (ref 0.3–4.0)
CK, MB: 45 ng/mL (ref 0.3–4.0)
CK, MB: 96.9 ng/mL (ref 0.3–4.0)
Relative Index: 2.6 — ABNORMAL HIGH (ref 0.0–2.5)
Relative Index: INVALID (ref 0.0–2.5)
Total CK: 129 U/L (ref 7–232)
Total CK: 573 U/L — ABNORMAL HIGH (ref 7–232)
Total CK: 745 U/L — ABNORMAL HIGH (ref 7–232)
Troponin I: 1.11 ng/mL (ref 0.00–0.06)
Troponin I: 21.91 ng/mL (ref 0.00–0.06)
Troponin I: 30.67 ng/mL (ref 0.00–0.06)
Troponin I: 5.91 ng/mL (ref 0.00–0.06)

## 2011-02-09 LAB — BASIC METABOLIC PANEL
BUN: 12 mg/dL (ref 6–23)
CO2: 25 mEq/L (ref 19–32)
CO2: 25 mEq/L (ref 19–32)
Chloride: 103 mEq/L (ref 96–112)
Chloride: 105 mEq/L (ref 96–112)
Chloride: 106 mEq/L (ref 96–112)
Chloride: 106 mEq/L (ref 96–112)
Creatinine, Ser: 1.11 mg/dL (ref 0.4–1.5)
Creatinine, Ser: 1.3 mg/dL (ref 0.4–1.5)
GFR calc Af Amer: >60 mL/min (ref >60)
GFR calc Af Amer: >60 mL/min (ref >60)
GFR calc Af Amer: >60 mL/min (ref >60)
Glucose, Bld: 112 mg/dL — ABNORMAL HIGH (ref 70–99)
Potassium: 4.1 mEq/L (ref 3.5–5.1)
Potassium: 4.1 mEq/L (ref 3.5–5.1)
Potassium: 4.5 mEq/L (ref 3.5–5.1)
Sodium: 136 mEq/L (ref 135–145)
Sodium: 139 mEq/L (ref 135–145)

## 2011-02-09 LAB — COMPREHENSIVE METABOLIC PANEL
ALT: 39 U/L (ref 0–53)
AST: 123 U/L — ABNORMAL HIGH (ref 0–37)
Albumin: 3 g/dL — ABNORMAL LOW (ref 3.5–5.2)
Calcium: 8.6 mg/dL (ref 8.4–10.5)
Creatinine, Ser: 1.25 mg/dL (ref 0.4–1.5)
GFR calc Af Amer: >60 mL/min (ref >60)
GFR calc non Af Amer: >60 mL/min (ref >60)
Sodium: 139 mEq/L (ref 135–145)
Total Protein: 6.4 g/dL (ref 6.0–8.3)

## 2011-02-09 LAB — CBC
HCT: 36.5 % — ABNORMAL LOW (ref 39.0–52.0)
HCT: 37.6 % — ABNORMAL LOW (ref 39.0–52.0)
HCT: 38.5 % — ABNORMAL LOW (ref 39.0–52.0)
HCT: 42.2 % (ref 39.0–52.0)
Hemoglobin: 12.1 g/dL — ABNORMAL LOW (ref 13.0–17.0)
Hemoglobin: 13.8 g/dL (ref 13.0–17.0)
MCH: 28.8 pg (ref 26.0–34.0)
MCH: 29 pg (ref 26.0–34.0)
MCH: 29 pg (ref 26.0–34.0)
MCHC: 32.6 g/dL (ref 30.0–36.0)
MCHC: 33 g/dL (ref 30.0–36.0)
MCHC: 33.2 g/dL (ref 30.0–36.0)
MCHC: 33.3 g/dL (ref 30.0–36.0)
MCV: 87.2 fL (ref 78.0–100.0)
MCV: 87.5 fL (ref 78.0–100.0)
MCV: 87.7 fL (ref 78.0–100.0)
MCV: 87.9 fL (ref 78.0–100.0)
MCV: 88.1 fL (ref 78.0–100.0)
MCV: 88.7 fL (ref 78.0–100.0)
Platelets: 216 10*3/uL (ref 150–400)
Platelets: 229 10*3/uL (ref 150–400)
Platelets: 238 10*3/uL (ref 150–400)
Platelets: 256 10*3/uL (ref 150–400)
Platelets: 283 10*3/uL (ref 150–400)
RBC: 4.17 MIL/uL — ABNORMAL LOW (ref 4.22–5.81)
RBC: 4.21 MIL/uL — ABNORMAL LOW (ref 4.22–5.81)
RBC: 4.27 MIL/uL (ref 4.22–5.81)
RBC: 4.38 MIL/uL (ref 4.22–5.81)
RBC: 4.76 MIL/uL (ref 4.22–5.81)
RDW: 17.5 % — ABNORMAL HIGH (ref 11.5–15.5)
RDW: 17.7 % — ABNORMAL HIGH (ref 11.5–15.5)
RDW: 17.8 % — ABNORMAL HIGH (ref 11.5–15.5)
RDW: 17.9 % — ABNORMAL HIGH (ref 11.5–15.5)
WBC: 6 10*3/uL (ref 4.0–10.5)
WBC: 6.4 10*3/uL (ref 4.0–10.5)
WBC: 7.2 10*3/uL (ref 4.0–10.5)
WBC: 7.4 10*3/uL (ref 4.0–10.5)
WBC: 8.5 10*3/uL (ref 4.0–10.5)

## 2011-02-09 LAB — DIFFERENTIAL
Basophils Absolute: 0 10*3/uL (ref 0.0–0.1)
Basophils Relative: 0 % (ref 0–1)
Lymphocytes Relative: 24 % (ref 12–46)
Monocytes Absolute: 0.7 10*3/uL (ref 0.1–1.0)
Neutro Abs: 5.7 10*3/uL (ref 1.7–7.7)
Neutrophils Relative %: 67 % (ref 43–77)

## 2011-02-09 LAB — POCT CARDIAC MARKERS
Myoglobin, poc: >500 ng/mL (ref 12–200)
Troponin i, poc: 9.75 ng/mL (ref 0.00–0.09)

## 2011-02-09 LAB — HEPARIN LEVEL (UNFRACTIONATED)
Heparin Unfractionated: 0.18 IU/mL — ABNORMAL LOW (ref 0.30–0.70)
Heparin Unfractionated: 0.54 IU/mL (ref 0.30–0.70)

## 2011-02-09 LAB — CK TOTAL AND CKMB (NOT AT ARMC)
CK, MB: 151.8 ng/mL (ref 0.3–4.0)
Relative Index: 19.4 — ABNORMAL HIGH (ref 0.0–2.5)
Total CK: 782 U/L — ABNORMAL HIGH (ref 7–232)

## 2011-02-09 LAB — BRAIN NATRIURETIC PEPTIDE: Pro B Natriuretic peptide (BNP): 1850 pg/mL — ABNORMAL HIGH (ref 0.0–100.0)

## 2011-02-09 LAB — PROTIME-INR
INR: 1.34 (ref 0.00–1.49)
INR: 1.39 (ref 0.00–1.49)
INR: 1.85 — ABNORMAL HIGH (ref 0.00–1.49)
INR: 2.03 — ABNORMAL HIGH (ref 0.00–1.49)
Prothrombin Time: 23.1 seconds — ABNORMAL HIGH (ref 11.6–15.2)

## 2011-02-09 LAB — MRSA PCR SCREENING: MRSA by PCR: NEGATIVE

## 2011-02-09 LAB — LIPID PANEL: HDL: 32 mg/dL — ABNORMAL LOW (ref >39)

## 2011-02-26 LAB — COMPREHENSIVE METABOLIC PANEL
Albumin: 3.3 g/dL — ABNORMAL LOW (ref 3.5–5.2)
BUN: 8 mg/dL (ref 6–23)
Calcium: 9 mg/dL (ref 8.4–10.5)
Creatinine, Ser: 1.46 mg/dL (ref 0.4–1.5)
Potassium: 4 mEq/L (ref 3.5–5.1)
Total Protein: 6.8 g/dL (ref 6.0–8.3)

## 2011-02-26 LAB — APTT: aPTT: 28 seconds (ref 24–37)

## 2011-02-26 LAB — CBC
HCT: 41.9 % (ref 39.0–52.0)
HCT: 42.7 % (ref 39.0–52.0)
HCT: 43.5 % (ref 39.0–52.0)
Hemoglobin: 14.6 g/dL (ref 13.0–17.0)
MCHC: 33 g/dL (ref 30.0–36.0)
MCHC: 33.2 g/dL (ref 30.0–36.0)
MCHC: 33.2 g/dL (ref 30.0–36.0)
MCV: 91.6 fL (ref 78.0–100.0)
MCV: 91.6 fL (ref 78.0–100.0)
Platelets: 229 10*3/uL (ref 150–400)
Platelets: 231 10*3/uL (ref 150–400)
Platelets: 231 10*3/uL (ref 150–400)
Platelets: 233 10*3/uL (ref 150–400)
Platelets: 233 10*3/uL (ref 150–400)
RDW: 14.2 % (ref 11.5–15.5)
RDW: 14.3 % (ref 11.5–15.5)
RDW: 14.4 % (ref 11.5–15.5)
RDW: 14.4 % (ref 11.5–15.5)
RDW: 14.7 % (ref 11.5–15.5)
WBC: 7.6 10*3/uL (ref 4.0–10.5)
WBC: 8.9 10*3/uL (ref 4.0–10.5)

## 2011-02-26 LAB — POCT I-STAT, CHEM 8
Glucose, Bld: 103 mg/dL — ABNORMAL HIGH (ref 70–99)
HCT: 45 % (ref 39.0–52.0)
Hemoglobin: 15.3 g/dL (ref 13.0–17.0)
Potassium: 4.6 mEq/L (ref 3.5–5.1)
Sodium: 142 mEq/L (ref 135–145)
TCO2: 28 mmol/L (ref 0–100)

## 2011-02-26 LAB — LIPID PANEL
Cholesterol: 188 mg/dL (ref 0–200)
HDL: 31 mg/dL — ABNORMAL LOW (ref >39)
LDL Cholesterol: 123 mg/dL — ABNORMAL HIGH (ref 0–99)
Total CHOL/HDL Ratio: 6.1 RATIO
Triglycerides: 169 mg/dL — ABNORMAL HIGH (ref <150)

## 2011-02-26 LAB — HEPARIN LEVEL (UNFRACTIONATED)
Heparin Unfractionated: 0.13 IU/mL — ABNORMAL LOW (ref 0.30–0.70)
Heparin Unfractionated: 0.14 IU/mL — ABNORMAL LOW (ref 0.30–0.70)
Heparin Unfractionated: 0.2 IU/mL — ABNORMAL LOW (ref 0.30–0.70)
Heparin Unfractionated: 0.34 IU/mL (ref 0.30–0.70)
Heparin Unfractionated: 0.36 IU/mL (ref 0.30–0.70)
Heparin Unfractionated: 0.44 IU/mL (ref 0.30–0.70)

## 2011-02-26 LAB — TROPONIN I
Troponin I: 0.36 ng/mL — ABNORMAL HIGH (ref 0.00–0.06)
Troponin I: 0.4 ng/mL — ABNORMAL HIGH (ref 0.00–0.06)

## 2011-02-26 LAB — CK TOTAL AND CKMB (NOT AT ARMC)
CK, MB: 0.8 ng/mL (ref 0.3–4.0)
CK, MB: 0.9 ng/mL (ref 0.3–4.0)
Total CK: 98 U/L (ref 7–232)

## 2011-02-26 LAB — BASIC METABOLIC PANEL
BUN: 12 mg/dL (ref 6–23)
BUN: 15 mg/dL (ref 6–23)
CO2: 27 mEq/L (ref 19–32)
Calcium: 9.3 mg/dL (ref 8.4–10.5)
Chloride: 102 mEq/L (ref 96–112)
Chloride: 102 mEq/L (ref 96–112)
Creatinine, Ser: 1.34 mg/dL (ref 0.4–1.5)
Creatinine, Ser: 1.41 mg/dL (ref 0.4–1.5)
GFR calc Af Amer: >60 mL/min (ref >60)
GFR calc Af Amer: >60 mL/min (ref >60)
GFR calc non Af Amer: 55 mL/min — ABNORMAL LOW (ref >60)
Glucose, Bld: 120 mg/dL — ABNORMAL HIGH (ref 70–99)
Potassium: 3.6 mEq/L (ref 3.5–5.1)

## 2011-02-26 LAB — CARDIAC PANEL(CRET KIN+CKTOT+MB+TROPI)
CK, MB: 1 ng/mL (ref 0.3–4.0)
Relative Index: INVALID (ref 0.0–2.5)
Total CK: 73 U/L (ref 7–232)
Troponin I: 0.18 ng/mL — ABNORMAL HIGH (ref 0.00–0.06)
Troponin I: 0.27 ng/mL — ABNORMAL HIGH (ref 0.00–0.06)

## 2011-02-26 LAB — POCT CARDIAC MARKERS: CKMB, poc: <1 ng/mL — ABNORMAL LOW (ref 1.0–8.0)

## 2011-02-26 LAB — BRAIN NATRIURETIC PEPTIDE
Pro B Natriuretic peptide (BNP): 1602 pg/mL — ABNORMAL HIGH (ref 0.0–100.0)
Pro B Natriuretic peptide (BNP): 628 pg/mL — ABNORMAL HIGH (ref 0.0–100.0)

## 2011-02-26 LAB — PROTIME-INR
INR: 1.35 (ref 0.00–1.49)
INR: 1.42 (ref 0.00–1.49)
Prothrombin Time: 16.7 seconds — ABNORMAL HIGH (ref 11.6–15.2)

## 2011-03-13 ENCOUNTER — Encounter: Payer: Self-pay | Admitting: Internal Medicine

## 2011-03-13 ENCOUNTER — Ambulatory Visit (INDEPENDENT_AMBULATORY_CARE_PROVIDER_SITE_OTHER): Payer: Medicaid Other | Admitting: Internal Medicine

## 2011-03-13 DIAGNOSIS — I4891 Unspecified atrial fibrillation: Secondary | ICD-10-CM

## 2011-03-13 DIAGNOSIS — I472 Ventricular tachycardia: Secondary | ICD-10-CM

## 2011-03-13 DIAGNOSIS — I429 Cardiomyopathy, unspecified: Secondary | ICD-10-CM

## 2011-03-13 DIAGNOSIS — Z95810 Presence of automatic (implantable) cardiac defibrillator: Secondary | ICD-10-CM | POA: Insufficient documentation

## 2011-03-13 NOTE — Assessment & Plan Note (Signed)
revurrrnet VT treated via ATP on his device

## 2011-03-13 NOTE — Assessment & Plan Note (Signed)
The patient had an episode of SVT. This was terminated with antitachycardia pacing

## 2011-03-13 NOTE — Assessment & Plan Note (Signed)
Continue current meds  Will discuss with Dr Sharyn Lull about betablcokers

## 2011-03-13 NOTE — Progress Notes (Signed)
  HPI  Parker Johnson is a 45 y.o. male seen  in followup for ICD implantation  for  congestive heart failure and nonischemic cardiomyopathy.  He is part of the MADIT- R IT trial     catheterization 2 years ago demonstrating no obstructive disease; recent echo cardiogram demonstrated ejection fraction of 15%. He has a narrow QRS also received a dual-chamber device without left ventricular resynchronization  The patient denies SOB, chest pain, edema or palpitations.  he was recently hospitalized for congestive heart failure his diuretic dose was adjusted.  He iwas on triple anticoagulant therapy but now is on coumadin  No past medical history on file.  No past surgical history on file.  Current Outpatient Prescriptions  Medication Sig Dispense Refill  . furosemide (LASIX) 40 MG tablet Take 40 mg by mouth 2 (two) times daily.        Marland Kitchen lisinopril (PRINIVIL,ZESTRIL) 20 MG tablet Take 20 mg by mouth daily.        Marland Kitchen spironolactone (ALDACTONE) 25 MG tablet Take 25 mg by mouth daily.        Marland Kitchen warfarin (COUMADIN) 10 MG tablet Take 10 mg by mouth daily. UAD          No Known Allergies  Review of Systems negative except from HPI and PMH  Physical Exam Well developed and well nourished in no acute distress HENT normal E scleral and icterus clear Neck Supple JVP flat; carotids brisk and full Clear to ausculation Regular rate and rhythm, no murmurs gallops or rub Soft with active bowel sounds No clubbing cyanosis and edema Alert and oriented, grossly normal motor and sensory function Skin Warm and Dry     Assessment and  Plan

## 2011-03-13 NOTE — Patient Instructions (Signed)
Your physician recommends that you schedule a follow-up appointment in: 3 MONTHS WITH DR. KLEIN  Your physician recommends that you continue on your current medications as directed. Please refer to the Current Medication list given to you today.   

## 2011-03-13 NOTE — Assessment & Plan Note (Signed)
The patient's device was interrogated.  The information was reviewed. No changes were made in the programming.    

## 2011-03-20 ENCOUNTER — Emergency Department (HOSPITAL_COMMUNITY)
Admission: EM | Admit: 2011-03-20 | Discharge: 2011-03-20 | Disposition: A | Discharge status: Home / Self Care | Payer: Medicaid Other | Attending: Emergency Medicine | Admitting: Emergency Medicine

## 2011-03-20 DIAGNOSIS — R1033 Periumbilical pain: Secondary | ICD-10-CM | POA: Insufficient documentation

## 2011-03-20 DIAGNOSIS — R05 Cough: Secondary | ICD-10-CM | POA: Insufficient documentation

## 2011-03-20 DIAGNOSIS — K429 Umbilical hernia without obstruction or gangrene: Secondary | ICD-10-CM | POA: Insufficient documentation

## 2011-03-20 DIAGNOSIS — I2510 Atherosclerotic heart disease of native coronary artery without angina pectoris: Secondary | ICD-10-CM | POA: Insufficient documentation

## 2011-03-20 DIAGNOSIS — R1909 Other intra-abdominal and pelvic swelling, mass and lump: Secondary | ICD-10-CM | POA: Insufficient documentation

## 2011-03-20 DIAGNOSIS — I10 Essential (primary) hypertension: Secondary | ICD-10-CM | POA: Insufficient documentation

## 2011-03-20 DIAGNOSIS — R059 Cough, unspecified: Secondary | ICD-10-CM | POA: Insufficient documentation

## 2011-03-20 DIAGNOSIS — Z79899 Other long term (current) drug therapy: Secondary | ICD-10-CM | POA: Insufficient documentation

## 2011-03-20 DIAGNOSIS — I509 Heart failure, unspecified: Secondary | ICD-10-CM | POA: Insufficient documentation

## 2011-03-28 NOTE — Discharge Summary (Signed)
  NAME:  Parker Johnson, Parker Johnson NO.:  [id number]  MEDICAL RECORD NO.:  [id number]           PATIENT TYPE:  LOCATION:                                 FACILITY:  PHYSICIAN:  Osvaldo Shipper. Mystic Labo, M.D.DATE OF BIRTH:  Jul 12, 1966  DATE OF ADMISSION:  08/13/2010 DATE OF DISCHARGE:  08/18/2010                              DISCHARGE SUMMARY   DISCHARGE DIAGNOSES: 1. Decompensated nonischemic cardiomyopathy. 2. Systolic heart failure. 3. Cardiac arrhythmia. 4. Substance abuse. 5. Tobacco abuse. 6. Atrial fibrillation.  Parker Johnson is a 45 year old patient who was seen initially at the Emergency Department of the [facility] with complaint of shortness of breath.  The patient gave history of being short of breath for several days, getting gradually worse.  Upon his arrival to the emergency department, his cardiac examination revealed a regular rate and rhythm.  There were rales diffusely throughout the lung fields.  Tachypnea was noted.  There was no significant swelling appreciated.  Cardiac markers were negative for acute event.  Complete blood count was nonacute.  INR on the PT was elevated at 1.65 with a prothrombin time at 19.7 seconds.  There was noted cardiomegaly with pulmonary vascular congestion present on the x-ray.  The patient was subsequently admitted with congestive heart failure.  The initial history and physical was completed by Dr. Rinaldo Cloud. The patient was seen by pharmacy for anticoagulation protocols.  The patient was also seen for heart failure consult by the pharmacy.  The patient continued to show improvement with the IV Lasix.  He had strict intake and output monitoring as well as daily weights.  On August 16, 2011, the patient had 7-beat run of asymptomatic V- tach.  Serial markers were evaluated.  The patient was gradually able to get around in the room and hall better.  His BMP is improved.  His films improved and on August 19, 2011, it was the opinion that the patient had received maximum benefit from this hospitalization and could be discharged home.  MEDICATIONS AT DISCHARGE: 1. NicoDerm CQ 21 mg per 24 hours daily. 2. Potassium 20 mEq by mouth twice daily. 3. Coumadin 2.5 mg daily except Tuesdays, Thursdays, and Saturdays at     which time 1.25 mg is to be taken. 4. Lasix 40 mg twice daily. 5. Aspirin 81 mg daily. 6. Carvedilol 6.25 mg every 12 hours. 7. Plavix 75 mg daily with meal. 8. Coumadin 10 mg on Monday, Wednesday, Friday, and Sunday. 9. Lisinopril 20 mg daily. 10.Spironolactone 25 mg daily.  The patient is to see Dr. Sharyn Lull in 1 week, sooner if any changes, problems, or concerns.     Ivery Quale, P.A.   ______________________________ Osvaldo Shipper. Amay Mijangos, M.D.    HB/MEDQ  D:  03/15/2011  T:  03/16/2011  Job:  [id number]  Electronically Signed by Ivery Quale P.A. on 03/16/2011 12:37:58 PM Electronically Signed by Donia Guiles M.D. on 03/28/2011 12:11:17 PM

## 2011-04-10 NOTE — Consult Note (Signed)
NAME:  Parker Johnson, Parker Johnson NO.:  [id number]   MEDICAL RECORD NO.:  [id number]          PATIENT TYPE:  INP   LOCATION:  2037                         FACILITY:  [facility]   PHYSICIAN:  Duke Salvia, MD, FACCDATE OF BIRTH:  09-03-1966   DATE OF CONSULTATION:  08/21/2007  DATE OF DISCHARGE:                                 CONSULTATION   Thank you very much for asking [place] to see Mr. Parker Johnson in consultation  for nonsustained ventricular tachycardia in the setting of a nonischemic  cardiomyopathy.   Parker Johnson is a 45 year old African American gentleman who was identified  as having a cardiomyopathy in [place] about a year ago following  catheterization.  He has a history of hypertension and congestive heart  failure.  He was treated medically, but he was taking his medications  infrequently and irregularly.   He presented to the hospital on this occasion because of early satiety,  abdominal swelling, and dyspnea on exertion.  He denies peripheral  edema.  He denies palpitations or syncope.   His family history is notable for his father having a cardiomyopathy, on  a defibrillator. We do not know about his other siblings, including two  brothers and a sister.   His related medical history is notable for polysubstance abuse, with  heavy alcohol use even following his catheterization last summer, with a  significant downturn in frequency about 6 months ago.  There is also a  history of cocaine use that is not very recent.   PAST MEDICAL HISTORY:  In addition to the above is noncontributory.   REVIEW OF SYSTEMS:  Is also noncontributory and is broadly negative, as  outlined on the intake sheet from Loura Pardon, P.A.   CURRENT MEDICATIONS:  1. Coreg and 6.25, recently increased 12.5.  2. Lisinopril 20.  3. Lanoxin 0.25 daily.  4. Aldactone 25.  5. Lasix 40.  6. He is currently on intravenous heparin.  7. He was on Coumadin previously for indications that are  unclear,      although the chart says possible apical clot or atrial      fibrillation.   FAMILY HISTORY:  As noted previously.   I should also note that he has no known drug allergies.   PHYSICAL EXAMINATION:  GENERAL:  He is a healthy-appearing 45 year old  African American male in no acute distress.  VITAL SIGNS:  His blood pressure is 110/78, pulse 69.  HEENT:  Demonstrates no icterus or xanthoma.  NECK:  The neck veins were 9-10 cm.  BACK:  Without kyphosis or scoliosis.  LUNGS:  Clear.  HEART:  Sounds were regular, without murmurs or gallops.  The PMI was  displaced.  ABDOMEN:  Soft, with active bowel sounds.  There was no hepatomegaly.  No midline pulsation was appreciated.  EXTREMITIES:  Femoral pulses were 2+.  Distal pulses were intact.  There  was no clubbing, cyanosis or edema.  NEUROLOGIC:  Grossly normal.  SKIN:  Warm and dry.   Electrocardiogram was notable for a narrow QRS, with intervals of  0.13/0.08/0.36.  The axis  was 80 degrees.  There was poor R-wave  progression and T-wave inversions.   LABORATORIES:  Notable for a sodium of 139, creatinine was 1.33.   IMPRESSION:  1. Nonischemic cardiomyopathy of greater than a year's duration.  2. Nonsustained ventricular tachycardia.  3. Class II-III congestive heart failure.  4. Hypertension.  5. Medical noncompliance and polysubstance use.  6. Narrow QRS.  7. Family history of cardiomyopathy.  8. Question apical thrombus.  9. Question atrial fibrillation.  10.Coumadin therapy for #8.   DISCUSSION:  Parker Johnson has a nonischemic cardiomyopathy that  apparently is of at least a year's duration, but unfortunately he has  not taken his medications regularly, and so whatever recovery there may  be is possibly yet to be determined.  I suspected will not be a  significant one.   In terms of risk reductions of sudden cardiac death and congestive heart  failure, nonsustained ventricular tachycardia increases the  risks of  both nonselectively.  Hence, aggressive medical therapy is a most  appropriate first as you are doing, and will defer that to Dr. Sharyn Johnson.   In the event that his heart muscle fails to recover with 2-3 months of  intensive medical therapy, I would recommend implantation of an ICD for  risk reduction of sudden cardiac death.   In addition, his father has a defibrillator for weak heart muscle, and  this raises the specter of a familial cardiomyopathy identified in  African Americans here in [name] originally.   Family screening with echocardiograms would be appropriate.   RECOMMENDATIONS:  Based on the above. I would therefore:  1. Pursue aggressive medical therapy as you are doing.  2. Reassess his ejection fraction in a couple of months.  In the event      that it remains depressed, we would be glad      to see him again for ICD implantation.  3. Family screen for cardiomyopathy.   Please let us know if there is anything else we can do in the interim.      Duke Salvia, MD, [facility]  Electronically Signed     SCK/MEDQ  D:  08/21/2007  T:  08/22/2007  Job:  [id number]   cc:   Eduardo Osier. Parker Johnson, M.D.

## 2011-04-10 NOTE — Discharge Summary (Signed)
NAME:  Parker Johnson, Parker Johnson NO.:  [id number]   MEDICAL RECORD NO.:  [id number]          PATIENT TYPE:  INP   LOCATION:  2037                         FACILITY:  [facility]   PHYSICIAN:  Eduardo Osier. Sharyn Lull, M.D. DATE OF BIRTH:  10-Jun-1966   DATE OF ADMISSION:  08/18/2007  DATE OF DISCHARGE:  08/28/2007                               DISCHARGE SUMMARY   ADMITTING DIAGNOSES:  1. Decompensated congestive heart failure secondary to noncompliance      to medication.  2, Uncontrolled hypertension.  1. Severe nonischemic dilated cardiomyopathy.  2. History of alcohol abuse.  3. Tobacco abuse.  4. History of paroxysmal atrial fibrillation.   FINAL DIAGNOSES:  1. Compensated congestive heart failure.  2. Hypertension.  3. Severe nonischemic dilated cardiomyopathy status post nonsustained      ventricular tachycardia.  4. History of paroxysmal atrial fibrillation in the past.  5. History of alcohol abuse in the past.  6. Tobacco abuse in the past.   DISCHARGE HOME MEDICATIONS:  1. Lisinopril 20 mg one tablet daily.  2. Coreg 25 mg one tablet every 12 hours.  3. Aldactone 25 mg one tablet daily.  4. Lasix 40 mg one tablet daily.  5. Digoxin 0.125 mg one tablet daily.  6. Coumadin 10 mg one tablet daily.   DIET:  Low salt, low cholesterol.   ACTIVITY:  Increase activity slowly as tolerated.  The patient has been  advised to monitor weight daily and restrict fluid to 1 liter in 24  hours.  CBC and PT/INR on Monday on September 01, 2007 and follow-up with  me on September 04, 2007.   CONDITION AT DISCHARGE:  Stable.   FOLLOWUP:  Follow up with Dr. Thomasene Lot. Ladona Ridgel in 2-4 weeks.   BRIEF HISTORY AND HOSPITAL COURSE:  Mr. Parker Johnson is a 45 year old black  male with past medical history significant for nonischemic dilated  cardiomyopathy, history of congestive heart failure, hypertension,  history of paroxysmal atrial fibrillation, history of alcohol abuse,  tobacco abuse.  Complains of  progressive increasing shortness of breath  associated with feeling weak and tired and fatigued for last one week.  Denies any chest pain, nausea, vomiting, diaphoresis.  Denies  palpitation, lightheadedness or syncope.  Denies PND, orthopnea or leg  swelling.  States lately had epigastric fullness so he stopped his  medications.  States had left catheterization in [place], [place], about 21 years ago and was noted to have no blockages and was  told to have big heart.   PAST MEDICAL HISTORY:  As above.   PAST SURGICAL HISTORY:  None except for left catheterization as above.   ALLERGIES:  No known drug allergies.   MEDICATION AT HOME:  1. Coreg 6.25 mg every 12 hours.  2. Lisinopril 20 mg one tablet daily.  3. Digoxin 0.25 mg tablet daily.  4. Aldactone 25 mg p.o. daily.  5. Coumadin 5 mg p.o. daily.   SOCIAL HISTORY:  Has one daughter.  Smoked one pack per day for 22  years.  Drank 1/2 gallon of gin 2-3 times per week for many  years and  now drinks 2-3 beers per week for which he is not going to start again  as the patient states he will not drink alcohol or smoke again.   FAMILY HISTORY:  Father is alive.  He has coronary artery disease and  had stenting in the past.  Mother in good health.  Two brothers and one  sister in good health.   PHYSICAL EXAMINATION:  GENERAL:  He is alert, awake, and oriented x3 in  no acute distress.  VITAL SIGNS:  Blood pressure 142/108, pulse 80 regular.  HEENT:  Conjunctivae was pink.  NECK:  Supple, positive JVD.  LUNGS:  He has bibasilar rales and decreased breath sounds at bases.  CARDIOVASCULAR:  Normal S1, S2.  There is soft systolic murmur and S3  gallop.  ABDOMEN:  Soft.  Bowel sounds were present, obese, nontender.  EXTREMITIES:  There is no clubbing, cyanosis or edema.   LABORATORY DATA:  Chest x-rays showed marked enlargement of the  cardiopulmonary silhouette .  Pericardial effusion could not be  excluded, pulmonary  vascular congestion.  His CT of the chest was  negative for pulmonary embolism.  He had cardiomegaly with bilateral  pleural effusion and pulmonary edema, small amount of perihepatic  ascites.   OTHER LABORATORIES:  2-D echo done on September 22 which showed LV was  moderately dilated with EF of 10-15%.  There was mild mitral  regurgitation, and left atrium was moderately dilated.  There was mild  to moderate pulmonary regurgitation, mild to moderate tricuspid  regurgitation.   Admission labs:  Potassium 4.2, BUN 13, creatinine 1.3.  PT was 15.9  with INR 1.2.  D-dimer was elevated 1.25. Spiral CT of chest was  negative for PE as stated above.  His cardiac enzymes were negative.  Digoxin level was 0.06.   BRIEF HOSPITAL COURSE:  The patient was admitted to telemetry unit.  MI  was ruled out by serial enzymes and EKG.  The patient was started on IV  Lasix and continued on his beta blockers and ACE inhibitors with good  diuresis.  Beta blockers are gradually increased to maximum dose of  Coreg 25 mg every 12 hours.  The patient had occasional episodes of  nonsustained VT and bigeminy.  The patient remained asymptomatic.  EP  consultation was obtained with Dr. Ladona Ridgel and Dr. Graciela Husbands.  Their  recommendation was to continue aggressive management for now  and repeat the 2-D echo in 2-3 months.  If the EF remains below 30%,  then will consider for ICD.  The patient has been advised to take his  medications regularly to which he agrees.  The patient agrees also to  refrain from alcohol.  The patient will be discharged home on above  medications and will be followed up in my office in one week.      Eduardo Osier. Sharyn Lull, M.D.  Electronically Signed     MNH/MEDQ  D:  08/28/2007  T:  08/29/2007  Job:  [id number]

## 2011-06-14 ENCOUNTER — Encounter: Payer: Self-pay | Admitting: Internal Medicine

## 2011-06-18 ENCOUNTER — Encounter: Payer: Medicaid Other | Admitting: Internal Medicine

## 2011-06-27 ENCOUNTER — Ambulatory Visit (INDEPENDENT_AMBULATORY_CARE_PROVIDER_SITE_OTHER): Payer: Medicaid Other | Admitting: Internal Medicine

## 2011-06-27 ENCOUNTER — Encounter: Payer: Self-pay | Admitting: Internal Medicine

## 2011-06-27 DIAGNOSIS — I4891 Unspecified atrial fibrillation: Secondary | ICD-10-CM

## 2011-06-27 DIAGNOSIS — Z95810 Presence of automatic (implantable) cardiac defibrillator: Secondary | ICD-10-CM

## 2011-06-27 DIAGNOSIS — I472 Ventricular tachycardia: Secondary | ICD-10-CM

## 2011-06-27 NOTE — Assessment & Plan Note (Signed)
He has had recurrent ventricular tachycardia treated with antitachycardia pacing. There seems to be in uptick in the frequency of these events recently. This may be related to his relatively decompensated heart failure. Will reassess him in 10-12 weeks.

## 2011-06-27 NOTE — Assessment & Plan Note (Signed)
I've asked him to discontinue his Plavix. He'll continue on warfarin

## 2011-06-27 NOTE — Assessment & Plan Note (Signed)
The patient is having worsening peripheral edema. Thankfully there has not been significant shortness of breath. I've asked him to increase his diuretics from 40 b.i.d. To 80 b.i.d. I've asked him also to follow up with Dr. Sharyn Lull early next week. He'll need his blood work checked at that time

## 2011-06-27 NOTE — Progress Notes (Signed)
  HPI  Parker Johnson is a 45 y.o. male Is seen in  followup for ICD implantation  for  congestive heart failure and nonischemic cardiomyopathy.  He is part of the MADIT-R. IT trial     catheterization 2 years ago demonstrating no obstructive disease; recent echo cardiogram demonstrated ejection fraction of 15%. He has a narrow QRS also received a dual-chamber device without left ventricular resynchronization  Hospitalized for heart failure earlier this year. He's had problems over recent weeks with peripheral edema. He has tried to deal with this by increasing his diuretics which has helped only modestly. He's had only modest worsening of his shortness of breath rather surprisingly.  He has had no palpitations. (See below)  Past Medical History  Diagnosis Date  . Ventricular tachycardia   . AICD (automatic cardioverter/defibrillator) present     [employer]  . Tobacco abuse   . Alcohol abuse     hx  . Cardiomyopathy     secondary  . HTN (hypertension)     uncontrolled    Past Surgical History  Procedure Date  . Cardiac defibrillator placement 2010    [employer] 100    Current Outpatient Prescriptions  Medication Sig Dispense Refill  . carvedilol (COREG) 12.5 MG tablet Take 12.5 mg by mouth 2 (two) times daily.        . clopidogrel (PLAVIX) 75 MG tablet Take 75 mg by mouth daily.        . furosemide (LASIX) 40 MG tablet Take 40 mg by mouth 2 (two) times daily.       Marland Kitchen lisinopril (PRINIVIL,ZESTRIL) 20 MG tablet Take 20 mg by mouth daily.        Marland Kitchen spironolactone (ALDACTONE) 25 MG tablet Take 25 mg by mouth daily.        Marland Kitchen warfarin (COUMADIN) 10 MG tablet Take 10 mg by mouth daily. UAD          No Known Allergies  Review of Systems negative except from HPI and PMH  Physical Exam Well developed and well nourished in no acute distress HENT normal E scleral and icterus clear Neck Supple JVP 8-9 cm; carotids brisk and full Clear to  ausculation Regular rate and rhythm, no murmurs gallops or rub Soft with active bowel sounds No clubbing cyanosis; 2+ edema Alert and oriented, grossly normal motor and sensory function Skin Warm and Dry    Assessment and  Plan

## 2011-06-27 NOTE — Assessment & Plan Note (Signed)
No significant intercurrent atrial fibrillation 

## 2011-06-27 NOTE — Assessment & Plan Note (Signed)
The patient's device was interrogated.  The information was reviewed. No changes were made in the programming.    

## 2011-06-27 NOTE — Patient Instructions (Signed)
Your physician has recommended you make the following change in your medication:  1) Stop Plavix  2) Increase furosemide to 40mg  two tablets by mouth twice daily until you followup with Dr. Sharyn Lull.  Your physician recommends that you schedule a follow-up appointment in: 3 months.

## 2011-07-16 ENCOUNTER — Inpatient Hospital Stay (HOSPITAL_COMMUNITY): Payer: Medicaid Other

## 2011-07-16 ENCOUNTER — Inpatient Hospital Stay (HOSPITAL_COMMUNITY)
Admission: EM | Admit: 2011-07-16 | Discharge: 2011-07-24 | DRG: 292 | Disposition: A | Discharge status: Home Health | Payer: Medicaid Other | Source: Ambulatory Visit | Attending: Cardiology | Admitting: Cardiology

## 2011-07-16 ENCOUNTER — Emergency Department (HOSPITAL_COMMUNITY): Payer: Medicaid Other

## 2011-07-16 DIAGNOSIS — I252 Old myocardial infarction: Secondary | ICD-10-CM

## 2011-07-16 DIAGNOSIS — I509 Heart failure, unspecified: Secondary | ICD-10-CM | POA: Diagnosis present

## 2011-07-16 DIAGNOSIS — I428 Other cardiomyopathies: Secondary | ICD-10-CM | POA: Diagnosis present

## 2011-07-16 DIAGNOSIS — Z95810 Presence of automatic (implantable) cardiac defibrillator: Secondary | ICD-10-CM

## 2011-07-16 DIAGNOSIS — I498 Other specified cardiac arrhythmias: Secondary | ICD-10-CM | POA: Diagnosis present

## 2011-07-16 DIAGNOSIS — I10 Essential (primary) hypertension: Secondary | ICD-10-CM | POA: Diagnosis present

## 2011-07-16 DIAGNOSIS — I2510 Atherosclerotic heart disease of native coronary artery without angina pectoris: Secondary | ICD-10-CM | POA: Diagnosis present

## 2011-07-16 DIAGNOSIS — I5023 Acute on chronic systolic (congestive) heart failure: Principal | ICD-10-CM | POA: Diagnosis present

## 2011-07-16 DIAGNOSIS — Z7901 Long term (current) use of anticoagulants: Secondary | ICD-10-CM

## 2011-07-16 DIAGNOSIS — F17200 Nicotine dependence, unspecified, uncomplicated: Secondary | ICD-10-CM | POA: Diagnosis present

## 2011-07-16 LAB — COMPREHENSIVE METABOLIC PANEL
ALT: 25 U/L (ref 0–53)
AST: 37 U/L (ref 0–37)
Albumin: 3.1 g/dL — ABNORMAL LOW (ref 3.5–5.2)
Alkaline Phosphatase: 94 U/L (ref 39–117)
Chloride: 97 mEq/L (ref 96–112)
Potassium: 3.1 mEq/L — ABNORMAL LOW (ref 3.5–5.1)
Sodium: 136 mEq/L (ref 135–145)
Total Bilirubin: 4.3 mg/dL — ABNORMAL HIGH (ref 0.3–1.2)

## 2011-07-16 LAB — DIFFERENTIAL
Basophils Absolute: 0 10*3/uL (ref 0.0–0.1)
Basophils Relative: 0 % (ref 0–1)
Eosinophils Absolute: 0 10*3/uL (ref 0.0–0.7)
Monocytes Relative: 11 % (ref 3–12)
Neutro Abs: 5.6 10*3/uL (ref 1.7–7.7)
Neutrophils Relative %: 78 % — ABNORMAL HIGH (ref 43–77)

## 2011-07-16 LAB — CBC
Hemoglobin: 12.8 g/dL — ABNORMAL LOW (ref 13.0–17.0)
MCH: 26.3 pg (ref 26.0–34.0)
Platelets: 226 10*3/uL (ref 150–400)
Platelets: 206 10*3/uL (ref 150–400)
RBC: 4.86 MIL/uL (ref 4.22–5.81)
RBC: 4.99 MIL/uL (ref 4.22–5.81)
RDW: 19.2 % — ABNORMAL HIGH (ref 11.5–15.5)
WBC: 7.2 10*3/uL (ref 4.0–10.5)
WBC: 7.1 10*3/uL (ref 4.0–10.5)

## 2011-07-16 LAB — BASIC METABOLIC PANEL
CO2: 26 mEq/L (ref 19–32)
Calcium: 8.9 mg/dL (ref 8.4–10.5)
Creatinine, Ser: 1.02 mg/dL (ref 0.50–1.35)
GFR calc non Af Amer: >60 mL/min (ref >60)
Glucose, Bld: 144 mg/dL — ABNORMAL HIGH (ref 70–99)
Sodium: 135 mEq/L (ref 135–145)

## 2011-07-16 LAB — URINALYSIS, ROUTINE W REFLEX MICROSCOPIC
Glucose, UA: NEGATIVE mg/dL
Ketones, ur: NEGATIVE mg/dL
Protein, ur: >300 mg/dL — AB
pH: 5.5 (ref 5.0–8.0)

## 2011-07-16 LAB — APTT: aPTT: 34 seconds (ref 24–37)

## 2011-07-16 LAB — CARDIAC PANEL(CRET KIN+CKTOT+MB+TROPI)
CK, MB: 1.4 ng/mL (ref 0.3–4.0)
Relative Index: 0.3 (ref 0.0–2.5)
Total CK: 492 U/L — ABNORMAL HIGH (ref 7–232)
Troponin I: <0.3 ng/mL (ref <0.30)

## 2011-07-16 LAB — PROTIME-INR
INR: 1.97 — ABNORMAL HIGH (ref 0.00–1.49)
Prothrombin Time: 22.8 seconds — ABNORMAL HIGH (ref 11.6–15.2)

## 2011-07-16 LAB — URINE MICROSCOPIC-ADD ON

## 2011-07-16 LAB — POCT I-STAT TROPONIN I

## 2011-07-16 LAB — CK TOTAL AND CKMB (NOT AT ARMC): CK, MB: 1.4 ng/mL (ref 0.3–4.0)

## 2011-07-16 LAB — PRO B NATRIURETIC PEPTIDE: Pro B Natriuretic peptide (BNP): 17951 pg/mL — ABNORMAL HIGH (ref 0–125)

## 2011-07-17 LAB — LIPID PANEL
Cholesterol: 98 mg/dL (ref 0–200)
Total CHOL/HDL Ratio: 5.2 RATIO
Triglycerides: 94 mg/dL (ref <150)
VLDL: 19 mg/dL (ref 0–40)

## 2011-07-17 LAB — PROTIME-INR: INR: 2.03 — ABNORMAL HIGH (ref 0.00–1.49)

## 2011-07-17 LAB — CBC
HCT: 36.1 % — ABNORMAL LOW (ref 39.0–52.0)
MCHC: 33.5 g/dL (ref 30.0–36.0)
RDW: 19.2 % — ABNORMAL HIGH (ref 11.5–15.5)

## 2011-07-17 LAB — BASIC METABOLIC PANEL
CO2: 28 mEq/L (ref 19–32)
Chloride: 100 mEq/L (ref 96–112)
Creatinine, Ser: 1.12 mg/dL (ref 0.50–1.35)

## 2011-07-17 LAB — CARDIAC PANEL(CRET KIN+CKTOT+MB+TROPI): Relative Index: 0.3 (ref 0.0–2.5)

## 2011-07-18 LAB — BASIC METABOLIC PANEL
BUN: 24 mg/dL — ABNORMAL HIGH (ref 6–23)
Calcium: 8.5 mg/dL (ref 8.4–10.5)
GFR calc non Af Amer: >60 mL/min (ref >60)
Glucose, Bld: 86 mg/dL (ref 70–99)
Potassium: 4.5 mEq/L (ref 3.5–5.1)

## 2011-07-19 LAB — CBC
HCT: 38 % — ABNORMAL LOW (ref 39.0–52.0)
Hemoglobin: 12.5 g/dL — ABNORMAL LOW (ref 13.0–17.0)
MCV: 79.7 fL (ref 78.0–100.0)
RDW: 19.9 % — ABNORMAL HIGH (ref 11.5–15.5)
WBC: 5.9 10*3/uL (ref 4.0–10.5)

## 2011-07-19 LAB — BASIC METABOLIC PANEL
BUN: 18 mg/dL (ref 6–23)
CO2: 30 mEq/L (ref 19–32)
GFR calc non Af Amer: >60 mL/min (ref >60)
Glucose, Bld: 105 mg/dL — ABNORMAL HIGH (ref 70–99)
Potassium: 5 mEq/L (ref 3.5–5.1)

## 2011-07-20 LAB — BASIC METABOLIC PANEL
BUN: 15 mg/dL (ref 6–23)
CO2: 29 mEq/L (ref 19–32)
Chloride: 97 mEq/L (ref 96–112)
Glucose, Bld: 87 mg/dL (ref 70–99)
Potassium: 4.1 mEq/L (ref 3.5–5.1)
Sodium: 133 mEq/L — ABNORMAL LOW (ref 135–145)

## 2011-07-20 LAB — CBC
HCT: 36.2 % — ABNORMAL LOW (ref 39.0–52.0)
Hemoglobin: 11.8 g/dL — ABNORMAL LOW (ref 13.0–17.0)
MCH: 25.7 pg — ABNORMAL LOW (ref 26.0–34.0)
MCHC: 32.6 g/dL (ref 30.0–36.0)
MCV: 78.9 fL (ref 78.0–100.0)

## 2011-07-20 LAB — MAGNESIUM: Magnesium: 1.9 mg/dL (ref 1.5–2.5)

## 2011-07-21 LAB — PROTIME-INR
INR: 3.59 — ABNORMAL HIGH (ref 0.00–1.49)
Prothrombin Time: 36.4 seconds — ABNORMAL HIGH (ref 11.6–15.2)

## 2011-07-21 LAB — BASIC METABOLIC PANEL
CO2: 32 mEq/L (ref 19–32)
Glucose, Bld: 107 mg/dL — ABNORMAL HIGH (ref 70–99)
Potassium: 4 mEq/L (ref 3.5–5.1)
Sodium: 134 mEq/L — ABNORMAL LOW (ref 135–145)

## 2011-07-21 LAB — PRO B NATRIURETIC PEPTIDE: Pro B Natriuretic peptide (BNP): 7371 pg/mL — ABNORMAL HIGH (ref 0–125)

## 2011-07-22 LAB — PROTIME-INR: INR: 3.48 — ABNORMAL HIGH (ref 0.00–1.49)

## 2011-07-23 ENCOUNTER — Inpatient Hospital Stay (HOSPITAL_COMMUNITY): Payer: Medicaid Other

## 2011-07-23 LAB — CULTURE, BLOOD (ROUTINE X 2)
Culture  Setup Time: 201208210121
Culture  Setup Time: 201208210122
Culture: NO GROWTH

## 2011-07-23 LAB — CBC
HCT: 41.8 % (ref 39.0–52.0)
MCHC: 34.9 g/dL (ref 30.0–36.0)
MCV: 78.3 fL (ref 78.0–100.0)
Platelets: 249 10*3/uL (ref 150–400)
RDW: 20 % — ABNORMAL HIGH (ref 11.5–15.5)
WBC: 4.9 10*3/uL (ref 4.0–10.5)

## 2011-07-23 LAB — BASIC METABOLIC PANEL
BUN: 13 mg/dL (ref 6–23)
CO2: 35 mEq/L — ABNORMAL HIGH (ref 19–32)
Calcium: 9.1 mg/dL (ref 8.4–10.5)
Chloride: 99 mEq/L (ref 96–112)
Creatinine, Ser: 0.89 mg/dL (ref 0.50–1.35)

## 2011-07-23 LAB — PROTIME-INR: INR: 2.96 — ABNORMAL HIGH (ref 0.00–1.49)

## 2011-07-23 LAB — PRO B NATRIURETIC PEPTIDE: Pro B Natriuretic peptide (BNP): 7080 pg/mL — ABNORMAL HIGH (ref 0–125)

## 2011-07-24 LAB — PROTIME-INR: Prothrombin Time: 29.4 seconds — ABNORMAL HIGH (ref 11.6–15.2)

## 2011-08-08 NOTE — Discharge Summary (Signed)
NAME:  Parker Johnson, TRUSS NO.:  [id number]  MEDICAL RECORD NO.:  [id number]  LOCATION:  3731                         FACILITY:  [facility]  PHYSICIAN:  Eduardo Osier. Sharyn Lull, M.D. 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  DISCHARGE DIAGNOSES:  Compensated systolic heart failure, severe nonischemic dilated cardiomyopathy status post Bi-V ICD in the past, history of cardiogenic myocardial infarction in the past, hypertension, history of nonsustained ventricular tachycardia and paroxysmal atrial fibrillation in the past, history of EtOH, tobacco, and polysubstance abuse in the past, status post bronchitis.  DISCHARGE HOME MEDICATIONS: 1. Enteric-coated aspirin 81 mg one tablet daily. 2. Carvedilol 3.125 mg one tablet twice daily. 3. Digoxin 0.25 mg one tablet daily. 4. Lisinopril 5 mg one tablet twice daily. 5. Spironolactone 25 mg one tablet twice daily. 6. Warfarin 5 mg one tablet daily. 7. Furosemide 40 mg one tablet twice daily as before.  DIET:  Low-salt, low cholesterol.  The patient has been advised to monitor weight and blood pressure daily.  Heart failure instructions have been given.  The patient has been advised if he gains 3 pounds overnight or 5 pounds in 1 week or develops shortness of breath with coughing or swelling in the hand or feet or if he is using extra pillows at night, should call my office immediately.  Follow up with me in 1 week.  CONDITION AT  DISCHARGE:  Stable.  BRIEF HISTORY AND HOSPITAL COURSE:  The patient also has been advised to restrict the fluid to 1 liter per 24 hours.  The patient was discussed at length during this hospital stay and in the past regarding cardiac transplant.  The patient states he is not ready yet, but will discuss with family.  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  The patient denies any chest pain, nausea, vomiting diaphoresis.  The patient does give history of PND, orthopnea and progressive leg swelling, also complains of chills and low-grade fever. Denies any palpitation, lightheadedness or syncope.  Denies any noncompliance to medication or diet.  Denies any complaints.  PAST MEDICAL HISTORY:  As above.  PAST SURGICAL HISTORY:  Had Bi-V ICD in September 2010.  ALLERGIES:  No known drug allergies.  MEDICATION AT HOME: 1. He was on  Coreg 12.5 mg p.o. half tablet p.o. b.i.d. 2. Lisinopril 20 mg p.o. daily. 3. Lasix 40 mg p.o. b.i.d. 4. Aldactone 25 mg p.o. daily. 5. Plavix 75 mg p.o. daily which was stopped. 6. Warfarin 10 mg daily.  SOCIAL HISTORY:  He is single, has one child.  Smoked one-pack per day for 20 years.  Used to drink hard liquor.  Smoked marijuana in the past. 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  FAMILY HISTORY:  Positive for coronary artery disease.  PHYSICAL  EXAMINATION:  GENERAL:  He is alert, awake, and oriented x3 in mild respiratory distress. VITAL SIGNS:  Blood pressure was 115/91, pulse was 119, T-max was 99.5, O2 sats were 99% on 2 liters. NECK:  Supple.  Positive JVD. LUNGS:  She has decreased breath sounds at bases with bibasilar rales. CARDIOVASCULAR:  S1 and S2 was soft.  There was soft systolic murmur and S3 gallop. ABDOMEN:  Soft.  Bowel sounds were present, nontender. EXTREMITIES:  There is no clubbing, cyanosis.  There was 3+ edema.  LABS:  His hemoglobin was 12.8, hematocrit 38, white count of 7.2.  His PT was 22.8.  INR of 1.97.  Sodium was 135, potassium 3.4, BUN 16, creatinine 1.02, glucose was 144.  His pro BNP was 17,951.  Two sets of cardiac enzymes were negative.  His magnesium was 1.9.  Repeat pro BNP was 4890, which is trending down to the floor.  His PT/INR today is 29.4 with INR of 2.73.  BRIEF HOSPITAL COURSE:  The patient was admitted to telemetry unit.  MI was ruled out by serial enzymes and EKG.  The patient initially was started on IV Lasix and p.o. Aldactone with not significant diuresis. The patient was started on dobutamine initially with 2 mcg and then gradually increased to 5 mg and then to 8 mcg/kg/minute with good diuresis with improvement in his symptoms and resolution of his leg swelling.  The patient's admission weight was 86.9 kg.  Today his weight is 76.5 kg.  The patient lost approximately 23 pounds during the hospital stay.  The patient has been ambulating in hallway without any problems.  The patient had brief episodes of nonsustained VTs, remained asymptomatic. The patient did not have any ICD shocks during the hospital stay.  The patient will be discharged home on above medications and will be followed up in my office in 1 week.     Eduardo Osier. Sharyn Lull, M.D.     [facility]  D:  07/24/2011  T:  07/24/2011  Job:  [id number]  Electronically Signed by Rinaldo Cloud M.D. on 08/08/2011 09:40:12  PM

## 2011-09-06 LAB — BASIC METABOLIC PANEL
CO2: 28
Calcium: 8.5
Chloride: 102
Chloride: 108
Chloride: 99
Creatinine, Ser: 1.37
Creatinine, Ser: 1.33
GFR calc Af Amer: >60
GFR calc Af Amer: >60
GFR calc Af Amer: >60
GFR calc Af Amer: >60
GFR calc non Af Amer: 52 — ABNORMAL LOW
Potassium: 4.7
Potassium: 3.6
Potassium: 4.4
Sodium: 138
Sodium: 139
Sodium: 140

## 2011-09-06 LAB — APTT: aPTT: 26

## 2011-09-06 LAB — CBC
HCT: 49.9
HCT: 43.1
HCT: 44
HCT: 44.9
Hemoglobin: 15.5
Hemoglobin: 14
Hemoglobin: 14.9
Hemoglobin: 15.8
Hemoglobin: 15.9
Hemoglobin: 16.1
MCHC: 32.9
MCHC: 33
MCHC: 33.2
MCV: 88.9
MCV: 89.1
MCV: 88.3
MCV: 88.9
MCV: 88.9
MCV: 89.6
MCV: 89.8
Platelets: 234
Platelets: 236
Platelets: 262
RBC: 5.3
RBC: 5.61
RBC: 4.7
RBC: 4.76
RBC: 5.05
RBC: 5.36
RBC: 5.36
RBC: 5.4
RBC: 5.46
RDW: 14.9 — ABNORMAL HIGH
RDW: 14.7 — ABNORMAL HIGH
RDW: 15.1 — ABNORMAL HIGH
WBC: 6.9
WBC: 6.6
WBC: 6.9
WBC: 7.5
WBC: 7.6
WBC: 7.8
WBC: 7.9
WBC: 7.9
WBC: 8.1

## 2011-09-06 LAB — DIFFERENTIAL
Basophils Absolute: 0
Lymphocytes Relative: 44
Lymphs Abs: 2.9
Neutro Abs: 3

## 2011-09-06 LAB — PROTIME-INR
INR: 1.9 — ABNORMAL HIGH
INR: 2.1 — ABNORMAL HIGH
INR: 1.2
INR: 1.2
INR: 1.3
INR: 1.4
Prothrombin Time: 14.3
Prothrombin Time: 14.8
Prothrombin Time: 15.9 — ABNORMAL HIGH
Prothrombin Time: 16.6 — ABNORMAL HIGH
Prothrombin Time: 17.8 — ABNORMAL HIGH
Prothrombin Time: 19.1 — ABNORMAL HIGH

## 2011-09-06 LAB — I-STAT 8, (EC8 V) (CONVERTED LAB)
Acid-base deficit: 3 — ABNORMAL HIGH
BUN: 13
Chloride: 107
Glucose, Bld: 93
Potassium: 4.2
pCO2, Ven: 38.4 — ABNORMAL LOW
pH, Ven: 7.368 — ABNORMAL HIGH

## 2011-09-06 LAB — CK TOTAL AND CKMB (NOT AT ARMC)
CK, MB: 1.3
Relative Index: INVALID

## 2011-09-06 LAB — D-DIMER, QUANTITATIVE: D-Dimer, Quant: 1.25 — ABNORMAL HIGH

## 2011-09-06 LAB — LIPASE, BLOOD: Lipase: 19

## 2011-09-06 LAB — TROPONIN I: Troponin I: 0.14 — ABNORMAL HIGH

## 2011-09-06 LAB — HEPARIN LEVEL (UNFRACTIONATED)
Heparin Unfractionated: 0.53
Heparin Unfractionated: 0.38
Heparin Unfractionated: 0.41
Heparin Unfractionated: 0.63
Heparin Unfractionated: 0.66
Heparin Unfractionated: 0.76 — ABNORMAL HIGH
Heparin Unfractionated: 0.91 — ABNORMAL HIGH
Heparin Unfractionated: 0.96 — ABNORMAL HIGH

## 2011-09-06 LAB — COMPREHENSIVE METABOLIC PANEL
Albumin: 3 — ABNORMAL LOW
BUN: 11
Calcium: 8.4
Chloride: 104
Creatinine, Ser: 1.29
GFR calc non Af Amer: >60
Total Bilirubin: 1.9 — ABNORMAL HIGH

## 2011-09-06 LAB — POCT I-STAT CREATININE
Creatinine, Ser: 1.3
Operator id: 151321

## 2011-09-06 LAB — DIGOXIN LEVEL: Digoxin Level: 0.6 — ABNORMAL LOW

## 2011-09-06 LAB — AMYLASE: Amylase: 50

## 2011-09-06 LAB — MAGNESIUM: Magnesium: 2

## 2011-09-06 LAB — POCT CARDIAC MARKERS: Operator id: 151321

## 2011-09-06 LAB — TSH: TSH: 3.681

## 2011-09-06 LAB — LIPID PANEL: VLDL: 20

## 2011-09-06 LAB — B-NATRIURETIC PEPTIDE (CONVERTED LAB)
Pro B Natriuretic peptide (BNP): 2618 — ABNORMAL HIGH
Pro B Natriuretic peptide (BNP): 2623 — ABNORMAL HIGH

## 2011-09-27 ENCOUNTER — Encounter: Payer: Medicaid Other | Admitting: *Deleted

## 2011-10-05 ENCOUNTER — Encounter: Payer: Medicaid Other | Admitting: Internal Medicine

## 2011-12-07 ENCOUNTER — Other Ambulatory Visit: Payer: Self-pay | Admitting: Cardiology

## 2012-04-21 ENCOUNTER — Other Ambulatory Visit: Payer: Self-pay | Admitting: Cardiology

## 2012-05-06 ENCOUNTER — Ambulatory Visit (INDEPENDENT_AMBULATORY_CARE_PROVIDER_SITE_OTHER): Payer: Medicaid Other | Admitting: Internal Medicine

## 2012-05-06 ENCOUNTER — Encounter: Payer: Self-pay | Admitting: Internal Medicine

## 2012-05-06 VITALS — BP 118/77 | HR 55 | Ht 67.0 in | Wt 189.4 lb

## 2012-05-06 DIAGNOSIS — Z95810 Presence of automatic (implantable) cardiac defibrillator: Secondary | ICD-10-CM

## 2012-05-06 DIAGNOSIS — F17200 Nicotine dependence, unspecified, uncomplicated: Secondary | ICD-10-CM

## 2012-05-06 DIAGNOSIS — I4891 Unspecified atrial fibrillation: Secondary | ICD-10-CM

## 2012-05-06 DIAGNOSIS — I472 Ventricular tachycardia: Secondary | ICD-10-CM

## 2012-05-06 LAB — ICD DEVICE OBSERVATION
AL IMPEDENCE ICD: 632 Ohm
AL THRESHOLD: 0.6 V
ATRIAL PACING ICD: 1 pct
BAMS-0002: 8 ms
BAMS-0003: 70 {beats}/min
DEV-0020ICD: NEGATIVE
DEVICE MODEL ICD: 143406
RV LEAD AMPLITUDE: 12 mv
RV LEAD THRESHOLD: 0.5 V
TZAT-0001FASTVT: 2
TZAT-0001SLOWVT: 1
TZAT-0001SLOWVT: 2
TZAT-0004FASTVT: 10
TZAT-0004FASTVT: 8
TZAT-0004SLOWVT: 4
TZAT-0005FASTVT: 81 pct
TZAT-0005SLOWVT: 81 pct
TZAT-0013FASTVT: 4
TZAT-0018SLOWVT: NEGATIVE
TZAT-0019SLOWVT: 5 V
TZAT-0019SLOWVT: 5 V
TZAT-0020FASTVT: 1 ms
TZAT-0020SLOWVT: 1 ms
TZON-0004FASTVT: 10
TZON-0005FASTVT: 1
TZON-0005SLOWVT: 1
TZST-0001FASTVT: 3
TZST-0001FASTVT: 4
TZST-0001FASTVT: 6
TZST-0001SLOWVT: 5
TZST-0001SLOWVT: 7
TZST-0003FASTVT: 41 J
TZST-0003FASTVT: 41 J
TZST-0003FASTVT: 41 J
TZST-0003SLOWVT: 41 J
TZST-0003SLOWVT: 41 J
TZST-0003SLOWVT: 41 J
VENTRICULAR PACING ICD: 1 pct

## 2012-05-06 NOTE — Assessment & Plan Note (Signed)
The patient's device was interrogated.  The information was reviewed. No changes were made in the programming.    

## 2012-05-06 NOTE — Assessment & Plan Note (Signed)
No intercurrent Ventricular tachycardia  

## 2012-05-06 NOTE — Assessment & Plan Note (Signed)
No fib noted but single chmaber device

## 2012-05-06 NOTE — Patient Instructions (Signed)
Your physician wants you to follow-up in: 1 year with Dr. Klein. You will receive a reminder letter in the mail two months in advance. If you don't receive a letter, please call our office to schedule the follow-up appointment.  Your physician recommends that you continue on your current medications as directed. Please refer to the Current Medication list given to you today.  

## 2012-05-06 NOTE — Assessment & Plan Note (Signed)
staill smoking

## 2012-05-06 NOTE — Progress Notes (Signed)
  HPI  Parker Johnson is a 46 y.o. male  Is seen in followup for ICD implantation for congestive heart failure and nonischemic cardiomyopathy. He is part of the MADIT-R. IT trial    He has a narrow QRS also received a dual-chamber device without left ventricular resynchronization   He has done relatively well. He has no complaints of chest pain or shortness of breath and he is doing some work in the yard  .  Past Medical History  Diagnosis Date  . Ventricular tachycardia   . AICD (automatic cardioverter/defibrillator) present     [employer]  . Tobacco abuse   . Alcohol abuse     hx  . Cardiomyopathy     secondary  . HTN (hypertension)     uncontrolled    Past Surgical History  Procedure Date  . Cardiac defibrillator placement 2010    [employer]    Current Outpatient Prescriptions  Medication Sig Dispense Refill  . carvedilol (COREG) 12.5 MG tablet Take 12.5 mg by mouth 2 (two) times daily.        Marland Kitchen DIGOXIN PO Take by mouth daily.      . furosemide (LASIX) 40 MG tablet Take two tablets by mouth twice daily      . lisinopril (PRINIVIL,ZESTRIL) 20 MG tablet Take 20 mg by mouth daily.        Marland Kitchen spironolactone (ALDACTONE) 25 MG tablet Take 25 mg by mouth daily.        Marland Kitchen warfarin (COUMADIN) 10 MG tablet Take 10 mg by mouth daily. UAD          No Known Allergies  Review of Systems negative except from HPI and PMH  Physical Exam BP 118/77  Pulse 55  Ht 5\' 7"  (1.702 m)  Wt 189 lb 6.4 oz (85.911 kg)  BMI 29.66 kg/m2 Well developed and well nourished in no acute distress HENT normal E scleral and icterus clear Neck Supple JVP flat; carotids brisk and full Clear to ausculation Regular rate and rhythm, no murmurs gallops or rub Soft with active bowel sounds No clubbing cyanosis none Edema Alert and oriented, grossly normal motor and sensory function Skin Warm and Dry    Assessment and  Plan

## 2012-06-03 ENCOUNTER — Inpatient Hospital Stay (HOSPITAL_COMMUNITY)
Admission: EM | Admit: 2012-06-03 | Discharge: 2012-06-07 | DRG: 292 | Disposition: A | Discharge status: Home / Self Care | Payer: Medicaid Other | Attending: Cardiology | Admitting: Cardiology

## 2012-06-03 ENCOUNTER — Encounter (HOSPITAL_COMMUNITY): Payer: Self-pay

## 2012-06-03 ENCOUNTER — Emergency Department (HOSPITAL_COMMUNITY): Payer: Medicaid Other

## 2012-06-03 DIAGNOSIS — I5021 Acute systolic (congestive) heart failure: Principal | ICD-10-CM

## 2012-06-03 DIAGNOSIS — I252 Old myocardial infarction: Secondary | ICD-10-CM

## 2012-06-03 DIAGNOSIS — Z9119 Patient's noncompliance with other medical treatment and regimen: Secondary | ICD-10-CM

## 2012-06-03 DIAGNOSIS — Z87891 Personal history of nicotine dependence: Secondary | ICD-10-CM

## 2012-06-03 DIAGNOSIS — I4891 Unspecified atrial fibrillation: Secondary | ICD-10-CM | POA: Diagnosis present

## 2012-06-03 DIAGNOSIS — I428 Other cardiomyopathies: Secondary | ICD-10-CM | POA: Diagnosis present

## 2012-06-03 DIAGNOSIS — I10 Essential (primary) hypertension: Secondary | ICD-10-CM | POA: Diagnosis present

## 2012-06-03 DIAGNOSIS — I509 Heart failure, unspecified: Secondary | ICD-10-CM | POA: Diagnosis present

## 2012-06-03 DIAGNOSIS — F1010 Alcohol abuse, uncomplicated: Secondary | ICD-10-CM | POA: Diagnosis present

## 2012-06-03 DIAGNOSIS — Z91199 Patient's noncompliance with other medical treatment and regimen due to unspecified reason: Secondary | ICD-10-CM

## 2012-06-03 DIAGNOSIS — Z95810 Presence of automatic (implantable) cardiac defibrillator: Secondary | ICD-10-CM

## 2012-06-03 HISTORY — DX: Heart failure, unspecified: I50.9

## 2012-06-03 LAB — COMPREHENSIVE METABOLIC PANEL
ALT: 13 U/L (ref 0–53)
AST: 18 U/L (ref 0–37)
Alkaline Phosphatase: 92 U/L (ref 39–117)
CO2: 29 mEq/L (ref 19–32)
Calcium: 9.8 mg/dL (ref 8.4–10.5)
GFR calc non Af Amer: >90 mL/min (ref >90)
Potassium: 4 mEq/L (ref 3.5–5.1)
Sodium: 138 mEq/L (ref 135–145)

## 2012-06-03 LAB — CARDIAC PANEL(CRET KIN+CKTOT+MB+TROPI)
CK, MB: 2.3 ng/mL (ref 0.3–4.0)
Total CK: 126 U/L (ref 7–232)
Total CK: 136 U/L (ref 7–232)
Troponin I: <0.3 ng/mL (ref <0.30)
Troponin I: <0.3 ng/mL (ref <0.30)

## 2012-06-03 LAB — DIFFERENTIAL
Basophils Absolute: 0.1 10*3/uL (ref 0.0–0.1)
Basophils Absolute: 0.1 10*3/uL (ref 0.0–0.1)
Basophils Relative: 1 % (ref 0–1)
Eosinophils Relative: 14 % — ABNORMAL HIGH (ref 0–5)
Lymphocytes Relative: 27 % (ref 12–46)
Lymphs Abs: 1.8 10*3/uL (ref 0.7–4.0)
Monocytes Absolute: 0.4 10*3/uL (ref 0.1–1.0)
Neutro Abs: 2.9 10*3/uL (ref 1.7–7.7)
Neutro Abs: 3.2 10*3/uL (ref 1.7–7.7)
Neutrophils Relative %: 49 % (ref 43–77)

## 2012-06-03 LAB — BASIC METABOLIC PANEL
BUN: 12 mg/dL (ref 6–23)
Creatinine, Ser: 1.05 mg/dL (ref 0.50–1.35)
GFR calc Af Amer: >90 mL/min (ref >90)
GFR calc non Af Amer: 84 mL/min — ABNORMAL LOW (ref >90)

## 2012-06-03 LAB — CBC
HCT: 47.1 % (ref 39.0–52.0)
MCHC: 32.3 g/dL (ref 30.0–36.0)
MCV: 95.1 fL (ref 78.0–100.0)
Platelets: 157 10*3/uL (ref 150–400)
RBC: 5.06 MIL/uL (ref 4.22–5.81)
RDW: 14.7 % (ref 11.5–15.5)
RDW: 14.7 % (ref 11.5–15.5)
WBC: 6.6 10*3/uL (ref 4.0–10.5)

## 2012-06-03 LAB — MAGNESIUM: Magnesium: 1.8 mg/dL (ref 1.5–2.5)

## 2012-06-03 LAB — HEPARIN LEVEL (UNFRACTIONATED): Heparin Unfractionated: >2 IU/mL — ABNORMAL HIGH (ref 0.30–0.70)

## 2012-06-03 LAB — PROTIME-INR: INR: 1.29 (ref 0.00–1.49)

## 2012-06-03 MED ORDER — ALBUTEROL SULFATE (5 MG/ML) 0.5% IN NEBU
5.0000 mg | INHALATION_SOLUTION | Freq: Once | RESPIRATORY_TRACT | Status: AC
  Administered 2012-06-03: 5 mg via RESPIRATORY_TRACT
  Filled 2012-06-03 (×2): qty 0.5

## 2012-06-03 MED ORDER — HEPARIN SODIUM (PORCINE) 5000 UNIT/ML IJ SOLN: 5000.0000 [IU] | Freq: Three times a day (TID) | INTRAMUSCULAR | Status: DC

## 2012-06-03 MED ORDER — FUROSEMIDE 10 MG/ML IJ SOLN
40.0000 mg | Freq: Once | INTRAMUSCULAR | Status: AC
  Administered 2012-06-03: 40 mg via INTRAVENOUS
  Filled 2012-06-03: qty 4

## 2012-06-03 MED ORDER — DIGOXIN 125 MCG PO TABS
125.0000 ug | ORAL_TABLET | Freq: Every day | ORAL | Status: DC
  Administered 2012-06-03 – 2012-06-07 (×5): 125 ug via ORAL
  Filled 2012-06-03 (×6): qty 1

## 2012-06-03 MED ORDER — SODIUM CHLORIDE 0.9 % IV SOLN: 250.0000 mL | INTRAVENOUS | Status: DC | PRN

## 2012-06-03 MED ORDER — LISINOPRIL 5 MG PO TABS
5.0000 mg | ORAL_TABLET | Freq: Every day | ORAL | Status: DC
  Administered 2012-06-03 – 2012-06-07 (×5): 5 mg via ORAL
  Filled 2012-06-03 (×5): qty 1

## 2012-06-03 MED ORDER — WARFARIN - PHARMACIST DOSING INPATIENT: Freq: Every day | Status: DC

## 2012-06-03 MED ORDER — WARFARIN SODIUM 2.5 MG PO TABS
12.5000 mg | ORAL_TABLET | Freq: Once | ORAL | Status: AC
  Administered 2012-06-03: 12.5 mg via ORAL
  Filled 2012-06-03: qty 1

## 2012-06-03 MED ORDER — HEPARIN (PORCINE) IN NACL 100-0.45 UNIT/ML-% IJ SOLN
1250.0000 [IU]/h | INTRAMUSCULAR | Status: DC
  Filled 2012-06-03 (×2): qty 250

## 2012-06-03 MED ORDER — PNEUMOCOCCAL VAC POLYVALENT 25 MCG/0.5ML IJ INJ
0.5000 mL | INJECTION | Freq: Once | INTRAMUSCULAR | Status: AC
  Administered 2012-06-04: 0.5 mL via INTRAMUSCULAR
  Filled 2012-06-03 (×2): qty 0.5

## 2012-06-03 MED ORDER — SODIUM CHLORIDE 0.9 % IJ SOLN: 3.0000 mL | INTRAMUSCULAR | Status: DC | PRN

## 2012-06-03 MED ORDER — ONDANSETRON HCL 4 MG/2ML IJ SOLN: 4.0000 mg | Freq: Four times a day (QID) | INTRAMUSCULAR | Status: DC | PRN

## 2012-06-03 MED ORDER — SPIRONOLACTONE 25 MG PO TABS
25.0000 mg | ORAL_TABLET | Freq: Every day | ORAL | Status: DC
  Administered 2012-06-03 – 2012-06-07 (×5): 25 mg via ORAL
  Filled 2012-06-03 (×6): qty 1

## 2012-06-03 MED ORDER — CARVEDILOL 3.125 MG PO TABS
3.1250 mg | ORAL_TABLET | Freq: Two times a day (BID) | ORAL | Status: DC
  Administered 2012-06-03 – 2012-06-07 (×8): 3.125 mg via ORAL
  Filled 2012-06-03 (×10): qty 1

## 2012-06-03 MED ORDER — ASPIRIN EC 81 MG PO TBEC
81.0000 mg | DELAYED_RELEASE_TABLET | Freq: Every day | ORAL | Status: DC
  Administered 2012-06-03 – 2012-06-07 (×5): 81 mg via ORAL
  Filled 2012-06-03 (×6): qty 1

## 2012-06-03 MED ORDER — LEVALBUTEROL HCL 1.25 MG/0.5ML IN NEBU
1.2500 mg | INHALATION_SOLUTION | Freq: Four times a day (QID) | RESPIRATORY_TRACT | Status: DC | PRN
  Filled 2012-06-03 (×2): qty 0.5

## 2012-06-03 MED ORDER — HEPARIN (PORCINE) IN NACL 100-0.45 UNIT/ML-% IJ SOLN
1250.0000 [IU]/h | INTRAMUSCULAR | Status: DC
  Administered 2012-06-03: 1250 [IU]/h via INTRAVENOUS
  Filled 2012-06-03 (×2): qty 250

## 2012-06-03 MED ORDER — ACETAMINOPHEN 325 MG PO TABS
650.0000 mg | ORAL_TABLET | ORAL | Status: DC | PRN
  Administered 2012-06-06 – 2012-06-07 (×2): 650 mg via ORAL
  Filled 2012-06-03 (×2): qty 2

## 2012-06-03 MED ORDER — FUROSEMIDE 10 MG/ML IJ SOLN
40.0000 mg | Freq: Two times a day (BID) | INTRAMUSCULAR | Status: DC
  Administered 2012-06-03 – 2012-06-05 (×4): 40 mg via INTRAVENOUS
  Filled 2012-06-03 (×6): qty 4

## 2012-06-03 MED ORDER — SODIUM CHLORIDE 0.9 % IJ SOLN
3.0000 mL | Freq: Two times a day (BID) | INTRAMUSCULAR | Status: DC
  Administered 2012-06-03 – 2012-06-06 (×3): 3 mL via INTRAVENOUS

## 2012-06-03 MED ORDER — ALBUTEROL SULFATE (5 MG/ML) 0.5% IN NEBU
INHALATION_SOLUTION | RESPIRATORY_TRACT | Status: AC
  Filled 2012-06-03: qty 1

## 2012-06-03 MED ORDER — ALBUTEROL SULFATE (5 MG/ML) 0.5% IN NEBU
5.0000 mg | INHALATION_SOLUTION | Freq: Once | RESPIRATORY_TRACT | Status: AC
  Administered 2012-06-03: 5 mg via RESPIRATORY_TRACT

## 2012-06-03 NOTE — Progress Notes (Signed)
ANTICOAGULATION CONSULT NOTE - Initial Consult  Pharmacy Consult for heparin and coumadin Indication: hx of cardiogenic non-Q--wave MI and hx of afib  No Known Allergies  Patient Measurements: Weight: 189 lb 6 oz (85.9 kg) Heparin Dosing Weight: 83.6kg   Vital Signs: Temp: 98 F (36.7 C) (07/09 1033) Temp src: Oral (07/09 1033) BP: 139/94 mmHg (07/09 1124) Pulse Rate: 85  (07/09 1124)  Labs:  Basename 06/03/12 0457 06/03/12 0456  HGB 15.2 --  HCT 47.1 --  PLT 170 --  APTT -- --  LABPROT 16.9* --  INR 1.35 --  HEPARINUNFRC -- --  CREATININE 1.05 --  CKTOTAL -- 122  CKMB -- 2.3  TROPONINI -- <0.30    The CrCl is unknown because both a height and weight (above a minimum accepted value) are required for this calculation.   Medical History: Past Medical History  Diagnosis Date  . Ventricular tachycardia   . AICD (automatic cardioverter/defibrillator) present     [employer]  . Tobacco abuse   . Alcohol abuse     hx  . Cardiomyopathy     secondary  . HTN (hypertension)     uncontrolled  . CHF (congestive heart failure)     Medications:  Scheduled:    . albuterol  5 mg Nebulization Once  . albuterol  5 mg Nebulization Once  . furosemide  40 mg Intravenous Once   Infusions:    Assessment: 46 yo male with hx of cardiogenic non-Q-wave MI and hx of afib will be continuing on coumadin while bridging with heparin.  Home coumadin dose was 10mg  po qday (last taken on 07/08). INR today was 1.35.  CBC ok  Goal of Therapy:  Heparin level 0.3-0.7 units/ml Monitor platelets by anticoagulation protocol: Yes INR 2-3   Plan:  1) Coumadin 12.5mg  po x1 tonight 2) Daily PT/INR 3) Start heparin at 1250 units/hr. No bolus. 4) Check a heparin level 6 hours after drip is started. 5) Daily heparin level and CBC  Emelyn Roen, Tsz-Yin 06/03/2012,12:36 PM

## 2012-06-03 NOTE — H&P (Signed)
Parker Johnson is an 46 y.o. male.   Chief Complaint: Progressive increasing shortness of breath HPI: Patient is 46 year old male with past medical history significant for multiple medical problems i.e. 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 fib, history of alcohol abuse polysubstance abuse and tobacco abuse in the past, history of recurrent decompensated systolic heart failure, EF approximately 15% came to the ER complaining of progressive increasing shortness of breath for last 2-3 days and last night her he could not sleep he did patient also complains of cough and cold but denies any fever or chills denies any sore throat. Patient does give history of PND orthopnea but denies any leg swelling. Denies chest pain nausea vomiting diaphoresis. Denies palpitation lightheadedness or syncope patient states he has been eating salty food for last few days. Patient was noted in acute pulmonary edema requiring BiPAP and IV Lasix with improvement in his symptoms. Patient states he recently had intubation of his ICD and denies any ICD discharges.  Past Medical History  Diagnosis Date  . Ventricular tachycardia   . AICD (automatic cardioverter/defibrillator) present     [employer]  . Tobacco abuse   . Alcohol abuse     hx  . Cardiomyopathy     secondary  . HTN (hypertension)     uncontrolled  . CHF (congestive heart failure)     Past Surgical History  Procedure Date  . Cardiac defibrillator placement 2010    [employer] 100    Family History  Problem Relation Age of Onset  . Cardiomyopathy Father     on a defib   Social History:  reports that he has been smoking Cigarettes.  He has a 7.5 pack-year smoking history. He has never used smokeless tobacco. He reports that he drinks alcohol. He reports that he uses illicit drugs.  Allergies: No  Known Allergies   (Not in a hospital admission)  Results for orders placed during the hospital encounter of 06/03/12 (from the past 48 hour(s))  PRO B NATRIURETIC PEPTIDE     Status: Abnormal   Collection Time   06/03/12  4:56 AM      Component Value Range Comment   Pro B Natriuretic peptide (BNP) 6857.0 (*) 0 - 125 pg/mL   CARDIAC PANEL(CRET KIN+CKTOT+MB+TROPI)     Status: Normal   Collection Time   06/03/12  4:56 AM      Component Value Range Comment   Total CK 122  7 - 232 U/L    CK, MB 2.3  0.3 - 4.0 ng/mL    Troponin I <0.30  <0.30 ng/mL    Relative Index 1.9  0.0 - 2.5   BASIC METABOLIC PANEL     Status: Abnormal   Collection Time   06/03/12  4:57 AM      Component Value Range Comment   Sodium 138  135 - 145 mEq/L    Potassium 4.0  3.5 - 5.1 mEq/L    Chloride 101  96 - 112 mEq/L    CO2 25  19 - 32 mEq/L    Glucose, Bld 127 (*) 70 - 99 mg/dL    BUN 12  6 - 23 mg/dL    Creatinine, Ser 1.61  0.50 - 1.35 mg/dL    Calcium 8.9  8.4 - 09.6 mg/dL    GFR calc non Af Amer 84 (*) >90 mL/min    GFR calc Af  Amer >90  >90 mL/min   CBC     Status: Normal   Collection Time   06/03/12  4:57 AM      Component Value Range Comment   WBC 6.4  4.0 - 10.5 K/uL    RBC 4.95  4.22 - 5.81 MIL/uL    Hemoglobin 15.2  13.0 - 17.0 g/dL    HCT 82.9  56.2 - 13.0 %    MCV 95.2  78.0 - 100.0 fL    MCH 30.7  26.0 - 34.0 pg    MCHC 32.3  30.0 - 36.0 g/dL    RDW 86.5  78.4 - 69.6 %    Platelets 170  150 - 400 K/uL   DIFFERENTIAL     Status: Abnormal   Collection Time   06/03/12  4:57 AM      Component Value Range Comment   Neutrophils Relative 45  43 - 77 %    Neutro Abs 2.9  1.7 - 7.7 K/uL    Lymphocytes Relative 30  12 - 46 %    Lymphs Abs 1.9  0.7 - 4.0 K/uL    Monocytes Relative 6  3 - 12 %    Monocytes Absolute 0.4  0.1 - 1.0 K/uL    Eosinophils Relative 18 (*) 0 - 5 %    Eosinophils Absolute 1.2 (*) 0.0 - 0.7 K/uL    Basophils Relative 1  0 - 1 %    Basophils Absolute 0.1  0.0 - 0.1 K/uL     PROTIME-INR     Status: Abnormal   Collection Time   06/03/12  4:57 AM      Component Value Range Comment   Prothrombin Time 16.9 (*) 11.6 - 15.2 seconds    INR 1.35  0.00 - 1.49    Dg Chest 2 View  06/03/2012  *RADIOLOGY REPORT*  Clinical Data: Chest pain, shortness of breath and cough.  CHEST - 2 VIEW  Comparison: Chest radiograph performed 07/23/2011  Findings: The lungs are well-aerated.  Vascular congestion is noted, with mildly increased interstitial markings, raising question for minimal interstitial edema.  There is no evidence of pleural effusion or pneumothorax.  The heart is enlarged; a pacemaker/AICD is noted at the left chest wall, with leads ending at the right atrium and right ventricle. No acute osseous abnormalities are seen.  IMPRESSION: Vascular congestion and cardiomegaly, with mildly increased interstitial markings, raising question for minimal interstitial edema.  Original Report Authenticated By: Tonia Ghent, M.D.    Review of Systems  Constitutional: Negative for fever and chills.  HENT: Negative for hearing loss.   Respiratory: Positive for cough and shortness of breath. Negative for hemoptysis.   Cardiovascular: Positive for orthopnea and PND. Negative for chest pain, palpitations, claudication and leg swelling.  Gastrointestinal: Negative for heartburn, nausea, vomiting and abdominal pain.  Genitourinary: Negative for dysuria.  Neurological: Negative for dizziness and headaches.    Blood pressure 124/78, pulse 76, temperature 98.1 F (36.7 C), temperature source Oral, resp. rate 26, SpO2 96.00%. Physical Exam  Constitutional: He is oriented to person, place, and time. He appears well-developed and well-nourished.  HENT:  Head: Atraumatic.  Eyes: Conjunctivae and EOM are normal. Left eye exhibits discharge. No scleral icterus.  Neck: Neck supple. JVD present. No thyromegaly present.  Cardiovascular:       Regular rate and rhythm S1 and S2 soft there is soft  systolic murmur and S3 gallop  Respiratory:       Decreased breath  sounds at bases with bibasilar Rales  GI: Soft. Bowel sounds are normal. He exhibits no distension. There is no tenderness. There is no rebound.  Musculoskeletal: He exhibits no edema and no tenderness.  Lymphadenopathy:    He has no cervical adenopathy.  Neurological: He is alert and oriented to person, place, and time.     Assessment/Plan Acute systolic congestive heart failure secondary to noncompliance to medication Severe nonischemic dilated cardiomyopathy History of cardiogenic non-Q-wave myocardial infarction Hypertension History of nonsustained VT History of paroxysmal A. fib History of polysubstance abuse History of alcohol abuse History of tobacco abuse Plan As per orders  Alya Smaltz N 06/03/2012, 9:32 AM

## 2012-06-03 NOTE — ED Notes (Signed)
Meal tray ordered 

## 2012-06-03 NOTE — ED Notes (Signed)
Called [phone] to give report. "RN will call back".

## 2012-06-03 NOTE — ED Notes (Signed)
Called [phone] to give report 

## 2012-06-03 NOTE — ED Notes (Signed)
Resp in with pt °

## 2012-06-03 NOTE — ED Notes (Signed)
Family at bedside. 

## 2012-06-03 NOTE — ED Notes (Addendum)
PT reports having a cold; hx of CHF; feels like it is in lungs and chest; feels SOB, coughing.

## 2012-06-03 NOTE — ED Notes (Signed)
Patient transferred via w/c to 3709 at baseline. Cardiac monitor on.

## 2012-06-03 NOTE — ED Provider Notes (Signed)
History     CSN: [id number]  Arrival date & time 06/03/12  4098   First MD Initiated Contact with Patient 06/03/12 0444      Chief Complaint  Patient presents with  . Shortness of Breath    (Consider location/radiation/quality/duration/timing/severity/associated sxs/prior treatment) HPI 46 yo male presents to the ER with complaint of shortness of breath.  Pt with h/o nonischemic cardiomyopathy and  CHF. Patient reports he's had 2 days of a cold with increased congestion, cough chills and feeling fatigued. Patient woke up this morning acutely short of breath. Patient denies missing any doses of his medications. He admits to not sticking closely to his low salt diet. He denies any alcohol use. He denies any chest pain. No prior history of COPD or asthma. No prior intubations for CHF. Past Medical History  Diagnosis Date  . Ventricular tachycardia   . AICD (automatic cardioverter/defibrillator) present     [employer] 100  . Tobacco abuse   . Alcohol abuse     hx  . Cardiomyopathy     secondary  . HTN (hypertension)     uncontrolled    Past Surgical History  Procedure Date  . Cardiac defibrillator placement 2010    [employer] 100    Family History  Problem Relation Age of Onset  . Cardiomyopathy Father     on a defib    History  Substance Use Topics  . Smoking status: Current Everyday Smoker -- 0.3 packs/day for 25 years    Types: Cigarettes  . Smokeless tobacco: Never Used  . Alcohol Use: Yes     rarely      Review of Systems  All other systems reviewed and are negative.    Allergies  Review of patient's allergies indicates no known allergies.  Home Medications   Current Outpatient Rx  Name Route Sig Dispense Refill  . CARVEDILOL 12.5 MG PO TABS Oral Take 12.5 mg by mouth 2 (two) times daily.      Marland Kitchen DIGOXIN 0.125 MG PO TABS Oral Take 125 mcg by mouth daily.    . FUROSEMIDE 40 MG PO TABS  Take two tablets by mouth twice  daily    . LISINOPRIL 20 MG PO TABS Oral Take 20 mg by mouth daily.      Marland Kitchen SPIRONOLACTONE 25 MG PO TABS Oral Take 25 mg by mouth daily.      . WARFARIN SODIUM 10 MG PO TABS Oral Take 10 mg by mouth daily.       BP 155/96  Pulse 87  Temp 98.1 F (36.7 C) (Oral)  Resp 31  SpO2 98%  Physical Exam  Nursing note and vitals reviewed. Constitutional: He is oriented to person, place, and time. He appears well-developed and well-nourished. He appears distressed (short of breath, coughing unable to speak more than 3 words at a time).  HENT:  Head: Normocephalic and atraumatic.  Nose: Nose normal.  Mouth/Throat: Oropharynx is clear and moist.  Eyes: Conjunctivae and EOM are normal. Pupils are equal, round, and reactive to light.  Neck: Normal range of motion. Neck supple. No JVD present. No tracheal deviation present. No thyromegaly present.  Cardiovascular: Normal rate, regular rhythm, normal heart sounds and intact distal pulses.  Exam reveals no gallop and no friction rub.   No murmur heard. Pulmonary/Chest: No stridor. He is in respiratory distress. He has wheezes. He has rales. He exhibits no tenderness.  Abdominal: Soft. Bowel sounds are normal. He exhibits no distension  and no mass. There is no tenderness. There is no rebound and no guarding.  Musculoskeletal: Normal range of motion. He exhibits edema (trace edema to legs bilaterally). He exhibits no tenderness.  Lymphadenopathy:    He has no cervical adenopathy.  Neurological: He is oriented to person, place, and time. He exhibits normal muscle tone. Coordination normal.  Skin: Skin is dry. No rash noted. No erythema. No pallor.  Psychiatric: He has a normal mood and affect. His behavior is normal. Judgment and thought content normal.    ED Course  Procedures (including critical care time)  Labs Reviewed  PRO B NATRIURETIC PEPTIDE - Abnormal; Notable for the following:    Pro B Natriuretic peptide (BNP) 6857.0 (*)     All other  components within normal limits  BASIC METABOLIC PANEL - Abnormal; Notable for the following:    Glucose, Bld 127 (*)     GFR calc non Af Amer 84 (*)     All other components within normal limits  DIFFERENTIAL - Abnormal; Notable for the following:    Eosinophils Relative 18 (*)     Eosinophils Absolute 1.2 (*)     All other components within normal limits  CBC  CARDIAC PANEL(CRET KIN+CKTOT+MB+TROPI)   Dg Chest 2 View  06/03/2012  *RADIOLOGY REPORT*  Clinical Data: Chest pain, shortness of breath and cough.  CHEST - 2 VIEW  Comparison: Chest radiograph performed 07/23/2011  Findings: The lungs are well-aerated.  Vascular congestion is noted, with mildly increased interstitial markings, raising question for minimal interstitial edema.  There is no evidence of pleural effusion or pneumothorax.  The heart is enlarged; a pacemaker/AICD is noted at the left chest wall, with leads ending at the right atrium and right ventricle. No acute osseous abnormalities are seen.  IMPRESSION: Vascular congestion and cardiomegaly, with mildly increased interstitial markings, raising question for minimal interstitial edema.  Original Report Authenticated By: Tonia Ghent, M.D.    Date: 06/03/2012  Rate: 96  Rhythm: normal sinus rhythm  QRS Axis: right  Intervals: normal  ST/T Wave abnormalities: normal  Conduction Disutrbances:none  Narrative Interpretation: biatrial enlargement  Old EKG Reviewed: unchanged  CRITICAL CARE Performed by: Olivia Mackie   Total critical care time: 75  Critical care time was exclusive of separately billable procedures and treating other patients.  Critical care was necessary to treat or prevent imminent or life-threatening deterioration.  Critical care was time spent personally by me on the following activities: development of treatment plan with patient and/or surrogate as well as nursing, discussions with consultants, evaluation of patient's response to treatment,  examination of patient, obtaining history from patient or surrogate, ordering and performing treatments and interventions, ordering and review of laboratory studies, ordering and review of radiographic studies, pulse oximetry and re-evaluation of patient's condition.  1. CHF exacerbation       MDM  46 year old male with acute CHF exacerbation. Patient with extreme dyspnea requiring BiPAP. Patient with almost immediate improvement after being placed on BiPAP. He has had good diuresis after IV Lasix. Discuss case with his cardiologist, Dr. Sharyn Lull. Dr. Sharyn Lull requests trial of BiPAP to see if he can be discharged home to followup in the clinic with Lasix increase in the meantime.        Olivia Mackie, MD 06/03/12 [phone]

## 2012-06-03 NOTE — ED Notes (Signed)
Pt transported to xray 

## 2012-06-03 NOTE — ED Notes (Signed)
Received care of patient and report. Patient resting in bed. Assessment shows no change in baseline. Expiratory wheezes noted bilaterally. RT called.

## 2012-06-03 NOTE — ED Notes (Signed)
Pt to be taken off of BiPap to assess his response; RT paged to take pt off of BiPap.

## 2012-06-04 LAB — HEPARIN LEVEL (UNFRACTIONATED): Heparin Unfractionated: >2 IU/mL — ABNORMAL HIGH (ref 0.30–0.70)

## 2012-06-04 LAB — PROTIME-INR
INR: 1.34 (ref 0.00–1.49)
Prothrombin Time: 16.8 seconds — ABNORMAL HIGH (ref 11.6–15.2)

## 2012-06-04 LAB — CARDIAC PANEL(CRET KIN+CKTOT+MB+TROPI)
Relative Index: 1.8 (ref 0.0–2.5)
Total CK: 107 U/L (ref 7–232)
Troponin I: <0.3 ng/mL (ref <0.30)

## 2012-06-04 LAB — BASIC METABOLIC PANEL
Calcium: 9.2 mg/dL (ref 8.4–10.5)
Creatinine, Ser: 1.06 mg/dL (ref 0.50–1.35)
GFR calc Af Amer: >90 mL/min (ref >90)
GFR calc non Af Amer: 83 mL/min — ABNORMAL LOW (ref >90)
Sodium: 141 mEq/L (ref 135–145)

## 2012-06-04 LAB — CBC
Platelets: 157 10*3/uL (ref 150–400)
RDW: 14.6 % (ref 11.5–15.5)
WBC: 7.9 10*3/uL (ref 4.0–10.5)

## 2012-06-04 LAB — MAGNESIUM: Magnesium: 1.8 mg/dL (ref 1.5–2.5)

## 2012-06-04 MED ORDER — WARFARIN SODIUM 2.5 MG PO TABS
12.5000 mg | ORAL_TABLET | Freq: Once | ORAL | Status: AC
  Administered 2012-06-04: 12.5 mg via ORAL
  Filled 2012-06-04: qty 1

## 2012-06-04 MED ORDER — HEPARIN (PORCINE) IN NACL 100-0.45 UNIT/ML-% IJ SOLN
1650.0000 [IU]/h | INTRAMUSCULAR | Status: DC
  Administered 2012-06-05: 1650 [IU]/h via INTRAVENOUS
  Filled 2012-06-04 (×3): qty 250

## 2012-06-04 MED ORDER — HEPARIN (PORCINE) IN NACL 100-0.45 UNIT/ML-% IJ SOLN
1050.0000 [IU]/h | INTRAMUSCULAR | Status: DC
  Administered 2012-06-04: 1050 [IU]/h via INTRAVENOUS
  Administered 2012-06-04: 900 [IU]/h via INTRAVENOUS
  Filled 2012-06-04 (×2): qty 250

## 2012-06-04 NOTE — Progress Notes (Signed)
ANTICOAGULATION CONSULT NOTE - Follow Up Consult  Pharmacy Consult for heparin Indication: h/o Afib/NQWMI  Labs:  Basename 06/03/12 2002 06/03/12 1951 06/03/12 1314 06/03/12 1313 06/03/12 0457 06/03/12 0456  HGB -- -- 16.1 -- 15.2 --  HCT -- -- 48.1 -- 47.1 --  PLT -- -- 157 -- 170 --  APTT -- -- 24 -- -- --  LABPROT -- -- 16.3* -- 16.9* --  INR -- -- 1.29 -- 1.35 --  HEPARINUNFRC >2.00* >2.00* -- -- -- --  CREATININE -- -- 0.98 -- 1.05 --  CKTOTAL 136 -- -- 126 -- 122  CKMB 1.9 -- -- 2.3 -- 2.3  TROPONINI <0.30 -- -- <0.30 -- <0.30    Assessment: 46yo male supratherapeutic on heparin with initial dosing for subtherapeutic INR; of note, initial lab that reported was drawn from same arm as heparin was infusing, next lab was drawn appropriately per RN and remains >2.  Goal of Therapy:  Heparin level 0.3-0.7 units/ml   Plan:  Will hold heparin gtt x2hr then resume heparin at rate 4 units/kg/hr less, 900 units/hr, and check level 6hr after resumed.  Colleen Can PharmD BCPS 06/04/2012,12:50 AM

## 2012-06-04 NOTE — Progress Notes (Signed)
UR Completed Cloris Flippo Graves-Bigelow, RN,BSN [phone]  

## 2012-06-04 NOTE — Progress Notes (Signed)
ANTICOAGULATION CONSULT NOTE - Follow Up Consult  Pharmacy Consult for Heparin and warfarin Indication: hx of AFib/NQWMI  No Known Allergies  Patient Measurements: Height: 5\' 7"  (170.2 cm) Weight: 191 lb 12.8 oz (87 kg) IBW/kg (Calculated) : 66.1  Heparin Dosing Weight: 83.6kg  Vital Signs: Temp: 97.8 F (36.6 C) (07/10 0500) BP: 121/74 mmHg (07/10 0835) Pulse Rate: 74  (07/10 0835)  Labs:  Basename 06/04/12 1025 06/04/12 0500 06/04/12 0458 06/03/12 2002 06/03/12 1951 06/03/12 1314 06/03/12 1313 06/03/12 0457  HGB -- 16.0 -- -- -- 16.1 -- --  HCT -- 47.8 -- -- -- 48.1 -- 47.1  PLT -- 157 -- -- -- 157 -- 170  APTT -- -- -- -- -- 24 -- --  LABPROT 16.8* -- -- -- -- 16.3* -- 16.9*  INR 1.34 -- -- -- -- 1.29 -- 1.35  HEPARINUNFRC <0.10* -- -- >2.00* >2.00* -- -- --  CREATININE -- 1.06 -- -- -- 0.98 -- 1.05  CKTOTAL -- -- 107 136 -- -- 126 --  CKMB -- -- 1.9 1.9 -- -- 2.3 --  TROPONINI -- -- <0.30 <0.30 -- -- <0.30 --    Estimated Creatinine Clearance: 92.7 ml/min (by C-G formula based on Cr of 1.06).   Medications:  Scheduled:    . aspirin EC  81 mg Oral Daily  . carvedilol  3.125 mg Oral BID WC  . digoxin  125 mcg Oral Daily  . furosemide  40 mg Intravenous BID  . lisinopril  5 mg Oral Daily  . pneumococcal 23 valent vaccine  0.5 mL Intramuscular Once  . sodium chloride  3 mL Intravenous Q12H  . spironolactone  25 mg Oral Daily  . warfarin  12.5 mg Oral ONCE-1800  . Warfarin - Pharmacist Dosing Inpatient   Does not apply q1800  . DISCONTD: heparin  5,000 Units Subcutaneous Q8H    Assessment: 45 YOM started on heparin and warfarin for hx of AFib and non-Q wave MI. Initial dose of 1250units/hr gave supra-therapeutic level. Heparin was then held for 2 hours and restarted at a lower rate of 900units/hour. Heparin level then came back at <0.1. Confirmed with RN that heparin had been running appropriately and there were no issues with the line. INR was 1.3 after 12.5mg   dose of warfarin last night. CBC ok.  Goal of Therapy:  INR 2-3 Heparin level 0.3-0.7 units/ml Monitor platelets by anticoagulation protocol: Yes   Plan:  Increase heparin dose to 1050units/hr. Check heparin level in 6 hours Daily heparin level and CBC Warfarin 12.5mg  X1 tonight Daily PT/INR  Galya Dunnigan D. Aniketh Huberty, PharmD Clinical Pharmacist Pager: [phone] Phone: [phone] 06/04/2012 1:13 PM

## 2012-06-04 NOTE — Progress Notes (Signed)
Subjective:  Patient denies any chest pain states breathing has improved  Objective:  Vital Signs in the last 24 hours: Temp:  [97.8 F (36.6 C)-98.9 F (37.2 C)] 97.8 F (36.6 C) (07/10 0500) Pulse Rate:  [74-116] 74  (07/10 0835) Resp:  [20] 20  (07/10 0500) BP: (121-145)/(74-90) 121/74 mmHg (07/10 0835) SpO2:  [92 %-96 %] 96 % (07/10 0500) Weight:  [86.864 kg (191 lb 8 oz)-87 kg (191 lb 12.8 oz)] 87 kg (191 lb 12.8 oz) (07/10 0500)  Intake/Output from previous day: 07/09 0701 - 07/10 0700 In: -  Out: 2300 [Urine:2300] Intake/Output from this shift: Total I/O In: -  Out: 1000 [Urine:1000]  Physical Exam: Neck: no adenopathy, no carotid bruit, no JVD and supple, symmetrical, trachea midline Lungs: Decreased breath sound at bases with bibasilar Rales Heart: regular rate and rhythm, S1, S2 normal and Soft systolic murmur and S3 gallop noted Abdomen: soft, non-tender; bowel sounds normal; no masses,  no organomegaly Extremities: extremities normal, atraumatic, no cyanosis or edema  Lab Results:  Basename 06/04/12 0500 06/03/12 1314  WBC 7.9 6.6  HGB 16.0 16.1  PLT 157 157    Basename 06/04/12 0500 06/03/12 1314  NA 141 138  K 3.8 4.0  CL 99 99  CO2 30 29  GLUCOSE 105* 109*  BUN 13 12  CREATININE 1.06 0.98    Basename 06/04/12 0458 06/03/12 2002  TROPONINI <0.30 <0.30   Hepatic Function Panel  Basename 06/03/12 1314  PROT 7.8  ALBUMIN 3.5  AST 18  ALT 13  ALKPHOS 92  BILITOT 1.5*  BILIDIR --  IBILI --   No results found for this basename: CHOL in the last 72 hours No results found for this basename: PROTIME in the last 72 hours  Imaging: Imaging results have been reviewed and Dg Chest 2 View  06/03/2012  *RADIOLOGY REPORT*  Clinical Data: Chest pain, shortness of breath and cough.  CHEST - 2 VIEW  Comparison: Chest radiograph performed 07/23/2011  Findings: The lungs are well-aerated.  Vascular congestion is noted, with mildly increased interstitial  markings, raising question for minimal interstitial edema.  There is no evidence of pleural effusion or pneumothorax.  The heart is enlarged; a pacemaker/AICD is noted at the left chest wall, with leads ending at the right atrium and right ventricle. No acute osseous abnormalities are seen.  IMPRESSION: Vascular congestion and cardiomegaly, with mildly increased interstitial markings, raising question for minimal interstitial edema.  Original Report Authenticated By: Tonia Ghent, M.D.    Cardiac Studies:  Assessment/Plan:  Resolving Acute systolic congestive heart failure secondary to noncompliance to medication  Severe nonischemic dilated cardiomyopathy  History of cardiogenic non-Q-wave myocardial infarction  Hypertension  History of nonsustained VT  History of paroxysmal A. fib  History of polysubstance abuse  History of alcohol abuse  History of tobacco abuse Plan Continue present management Check labs in a.m.  LOS: 1 day    Marixa Mellott N 06/04/2012, 12:02 PM

## 2012-06-04 NOTE — Progress Notes (Addendum)
ANTICOAGULATION CONSULT NOTE - Follow Up Consult  Pharmacy Consult for Heparin and warfarin Indication: hx of AFib/NQWMI  No Known Allergies  Patient Measurements: Height: 5\' 7"  (170.2 cm) Weight: 191 lb 12.8 oz (87 kg) IBW/kg (Calculated) : 66.1  Heparin Dosing Weight: 83.6kg  Vital Signs: Temp: 98 F (36.7 C) (07/10 1400) BP: 133/89 mmHg (07/10 1727) Pulse Rate: 75  (07/10 1727)  Labs:  Basename 06/04/12 1949 06/04/12 1025 06/04/12 0500 06/04/12 0458 06/03/12 2002 06/03/12 1314 06/03/12 1313 06/03/12 0457  HGB -- -- 16.0 -- -- 16.1 -- --  HCT -- -- 47.8 -- -- 48.1 -- 47.1  PLT -- -- 157 -- -- 157 -- 170  APTT -- -- -- -- -- 24 -- --  LABPROT -- 16.8* -- -- -- 16.3* -- 16.9*  INR -- 1.34 -- -- -- 1.29 -- 1.35  HEPARINUNFRC <0.10* <0.10* -- -- >2.00* -- -- --  CREATININE -- -- 1.06 -- -- 0.98 -- 1.05  CKTOTAL -- -- -- 107 136 -- 126 --  CKMB -- -- -- 1.9 1.9 -- 2.3 --  TROPONINI -- -- -- <0.30 <0.30 -- <0.30 --    Estimated Creatinine Clearance: 92.7 ml/min (by C-G formula based on Cr of 1.06).   Medications:  Scheduled:     . aspirin EC  81 mg Oral Daily  . carvedilol  3.125 mg Oral BID WC  . digoxin  125 mcg Oral Daily  . furosemide  40 mg Intravenous BID  . lisinopril  5 mg Oral Daily  . pneumococcal 23 valent vaccine  0.5 mL Intramuscular Once  . sodium chloride  3 mL Intravenous Q12H  . spironolactone  25 mg Oral Daily  . warfarin  12.5 mg Oral ONCE-1800  . Warfarin - Pharmacist Dosing Inpatient   Does not apply q1800    Assessment: 45 YOM started on heparin and warfarin for hx of AFib and non-Q wave MI. Initial dose of 1250units/hr gave supra-therapeutic level. Heparin was then held for 2 hours and restarted at a lower rate of 900units/hour. Heparin level then came back at <0.1. Confirmed with RN that heparin had been running appropriately and there were no issues with the line.  Repeat level still < 0.1 despite earlier rate increase.  No bleeding noted  per chart notes.  Per RN, no issues with IV site or infusion today.  Goal of Therapy:  INR 2-3 Heparin level 0.3-0.7 units/ml Monitor platelets by anticoagulation protocol: Yes   Plan:  1. Increase IV heparin to 1250 units/hr. 2. Check heparin level in 6 hrs. 3. Continue daily CBC.  Reece Leader, Pharm D 06/04/2012 9:14 PM

## 2012-06-04 NOTE — Progress Notes (Signed)
I have reviewed the information, assessment, and plan by Brigid Re, PharmD and agreed with the plan.

## 2012-06-04 NOTE — Care Management Note (Signed)
    Page 1 of 1   06/04/2012     10:22:58 AM   CARE MANAGEMENT NOTE 06/04/2012  Patient:  Parker Johnson, Parker Johnson   Account Number:  [id number]  Date Initiated:  06/04/2012  Documentation initiated by:  GRAVES-BIGELOW,Shayley Medlin  Subjective/Objective Assessment:   Pt admitted with Shortness of Breath. Pt has family support. Pt states he has a scale at home and weighs daily. He stats he has had a HHRN in the past but will not need one at this time. He states that he tries to monitor NA/fluid  intake.     Action/Plan:   Pt has zone tool. Pt states he is ble to afford meds and takes them appropriate. He is not  homebound at this time. No further needs for CM at this time.   Anticipated DC Date:  06/06/2012   Anticipated DC Plan:  HOME/SELF CARE      DC Planning Services  CM consult      Choice offered to / List presented to:             Status of service:  Completed, signed off Medicare Important Message given?   (If response is "NO", the following Medicare IM given date fields will be blank) Date Medicare IM given:   Date Additional Medicare IM given:    Discharge Disposition:  HOME/SELF CARE  Per UR Regulation:  Reviewed for med. necessity/level of care/duration of stay  If discussed at Long Length of Stay Meetings, dates discussed:    Comments:

## 2012-06-04 NOTE — Progress Notes (Signed)
Pt had 7 bts of v tach x 2 since this AM. Both times asymptomatic. Dr. Sharyn Lull aware. New order received for Mg level. Ancil Linsey Rn

## 2012-06-05 LAB — BASIC METABOLIC PANEL
BUN: 16 mg/dL (ref 6–23)
Creatinine, Ser: 1.01 mg/dL (ref 0.50–1.35)
GFR calc Af Amer: >90 mL/min (ref >90)
GFR calc non Af Amer: 88 mL/min — ABNORMAL LOW (ref >90)
Potassium: 4.2 mEq/L (ref 3.5–5.1)

## 2012-06-05 LAB — HEPARIN LEVEL (UNFRACTIONATED): Heparin Unfractionated: 0.17 IU/mL — ABNORMAL LOW (ref 0.30–0.70)

## 2012-06-05 LAB — PROTIME-INR: Prothrombin Time: 17.8 seconds — ABNORMAL HIGH (ref 11.6–15.2)

## 2012-06-05 LAB — PRO B NATRIURETIC PEPTIDE: Pro B Natriuretic peptide (BNP): 5969 pg/mL — ABNORMAL HIGH (ref 0–125)

## 2012-06-05 LAB — CBC
MCHC: 35.2 g/dL (ref 30.0–36.0)
RDW: 14.5 % (ref 11.5–15.5)

## 2012-06-05 MED ORDER — WARFARIN SODIUM 7.5 MG PO TABS
15.0000 mg | ORAL_TABLET | Freq: Once | ORAL | Status: AC
  Administered 2012-06-05: 15 mg via ORAL
  Filled 2012-06-05: qty 2

## 2012-06-05 MED ORDER — HEPARIN BOLUS VIA INFUSION
1000.0000 [IU] | Freq: Once | INTRAVENOUS | Status: AC
  Administered 2012-06-05: 1000 [IU] via INTRAVENOUS
  Filled 2012-06-05: qty 1000

## 2012-06-05 MED ORDER — FUROSEMIDE 10 MG/ML IJ SOLN
40.0000 mg | Freq: Three times a day (TID) | INTRAMUSCULAR | Status: DC
  Administered 2012-06-05 – 2012-06-07 (×6): 40 mg via INTRAVENOUS
  Filled 2012-06-05 (×9): qty 4

## 2012-06-05 MED ORDER — HEPARIN (PORCINE) IN NACL 100-0.45 UNIT/ML-% IJ SOLN
1600.0000 [IU]/h | INTRAMUSCULAR | Status: DC
  Administered 2012-06-05 – 2012-06-07 (×2): 1600 [IU]/h via INTRAVENOUS
  Filled 2012-06-05 (×4): qty 250

## 2012-06-05 NOTE — Progress Notes (Signed)
Subjective:  Patient denies any chest pain states breathing is improved Objective:  Vital Signs in the last 24 hours: Temp:  [98 F (36.7 C)-98.5 F (36.9 C)] 98.4 F (36.9 C) (07/11 0545) Pulse Rate:  [75-93] 81  (07/11 0545) Resp:  [17-20] 17  (07/11 0545) BP: (121-133)/(76-89) 124/84 mmHg (07/11 0545) SpO2:  [94 %-96 %] 94 % (07/11 0545) Weight:  [85.5 kg (188 lb 7.9 oz)] 85.5 kg (188 lb 7.9 oz) (07/11 0545)  Intake/Output from previous day: 07/10 0701 - 07/11 0700 In: 358 [I.V.:358] Out: 2900 [Urine:2900] Intake/Output from this shift: Total I/O In: -  Out: 700 [Urine:700]  Physical Exam: Neck: no adenopathy, no carotid bruit and supple, symmetrical, trachea midline Lungs: Decreased breath sound at bases with bibasilar rales Heart: regular rate and rhythm, S1, S2 normal and Soft systolic murmur and S3 gallop noted Abdomen: soft, non-tender; bowel sounds normal; no masses,  no organomegaly Extremities: extremities normal, atraumatic, no cyanosis or edema  Lab Results:  Basename 06/05/12 0256 06/04/12 0500  WBC 10.1 7.9  HGB 17.4* 16.0  PLT 186 157    Basename 06/05/12 0256 06/04/12 0500  NA 135 141  K 4.2 3.8  CL 93* 99  CO2 30 30  GLUCOSE 124* 105*  BUN 16 13  CREATININE 1.01 1.06    Basename 06/04/12 0458 06/03/12 2002  TROPONINI <0.30 <0.30   Hepatic Function Panel  Basename 06/03/12 1314  PROT 7.8  ALBUMIN 3.5  AST 18  ALT 13  ALKPHOS 92  BILITOT 1.5*  BILIDIR --  IBILI --   No results found for this basename: CHOL in the last 72 hours No results found for this basename: PROTIME in the last 72 hours  Imaging: Imaging results have been reviewed and No results found.  Cardiac Studies:  Assessment/Plan:  Resolving Acute systolic congestive heart failure secondary to noncompliance to medication  Severe nonischemic dilated cardiomyopathy  History of cardiogenic non-Q-wave myocardial infarction  Hypertension  History of nonsustained VT    History of paroxysmal A. fib  History of polysubstance abuse  History of alcohol abuse  History of tobacco abuse  Plan Increase Lasix per orders Check labs in a.m.  LOS: 2 days    Nyrie Sigal N 06/05/2012, 11:14 AM

## 2012-06-05 NOTE — Progress Notes (Signed)
ANTICOAGULATION CONSULT NOTE - Follow Up Consult  Pharmacy Consult for Heparin Indication: hx of AFib/NQWMI  No Known Allergies  Patient Measurements: Heparin Dosing Weight: 83.6kg  Labs:  Basename 06/05/12 2016 06/05/12 1015 06/05/12 0256 06/04/12 1025 06/04/12 0500 06/04/12 0458 06/03/12 2002 06/03/12 1314 06/03/12 1313  HGB -- -- 17.4* -- 16.0 -- -- -- --  HCT -- -- 49.4 -- 47.8 -- -- 48.1 --  PLT -- -- 186 -- 157 -- -- 157 --  APTT -- -- -- -- -- -- -- 24 --  LABPROT -- -- 17.8* 16.8* -- -- -- 16.3* --  INR -- -- 1.44 1.34 -- -- -- 1.29 --  HEPARINUNFRC 0.74* 0.24* 0.17* -- -- -- -- -- --  CREATININE -- -- 1.01 -- 1.06 -- -- 0.98 --  CKTOTAL -- -- -- -- -- 107 136 -- 126  CKMB -- -- -- -- -- 1.9 1.9 -- 2.3  TROPONINI -- -- -- -- -- <0.30 <0.30 -- <0.30    Estimated Creatinine Clearance: 96.5 ml/min (by C-G formula based on Cr of 1.01).  Assessment: 46 yo male admitted 06/03/12 with increased SOB lasting 2-3 days. Hx of HF, afib, and non-Q wave MI.  Currently bridging with heparin and Coumadin. Suspected lab error in initial heparin level >2.  Held heparin and level <0.1. Repeat was sub-therapeutic and dose increased to 1650 units/hr. Now heparin level is 0.74- just slightly supra-therapeutic. No bleeding noted per RN.   Goal of Therapy:  Heparin level 0.3 to 0.7   Plan:  1. Decrease to 1600 units/hr.  2. Follow-up AM level.   Link Snuffer, PharmD, BCPS Clinical Pharmacist [phone] 06/05/2012 8:54 PM

## 2012-06-05 NOTE — Progress Notes (Signed)
ANTICOAGULATION CONSULT NOTE - Follow Up Consult  Pharmacy Consult for heparin Indication: h/o Afib/NQWMI  Labs:  Basename 06/05/12 0256 06/04/12 1949 06/04/12 1025 06/04/12 0500 06/04/12 0458 06/03/12 2002 06/03/12 1314 06/03/12 1313 06/03/12 0457  HGB 17.4* -- -- 16.0 -- -- -- -- --  HCT 49.4 -- -- 47.8 -- -- 48.1 -- --  PLT 186 -- -- 157 -- -- 157 -- --  APTT -- -- -- -- -- -- 24 -- --  LABPROT 17.8* -- 16.8* -- -- -- 16.3* -- --  INR 1.44 -- 1.34 -- -- -- 1.29 -- --  HEPARINUNFRC 0.17* <0.10* <0.10* -- -- -- -- -- --  CREATININE -- -- -- 1.06 -- -- 0.98 -- 1.05  CKTOTAL -- -- -- -- 107 136 -- 126 --  CKMB -- -- -- -- 1.9 1.9 -- 2.3 --  TROPONINI -- -- -- -- <0.30 <0.30 -- <0.30 --    Assessment: 46yo male remains subtherapeutic on heparin after rates increases; initial two heparin levels of >2 were likely inaccurate.  Goal of Therapy:  Heparin level 0.3-0.7 units/ml   Plan:  Will increase heparin gtt by 3 units/kg/hr to 1500 units/hr and check level in 6hr.  Colleen Can PharmD BCPS 06/05/2012,3:58 AM

## 2012-06-05 NOTE — Progress Notes (Signed)
ANTICOAGULATION CONSULT NOTE - Follow Up Consult  Pharmacy Consult for Heparin and warfarin Indication: hx of AFib/NQWMI  No Known Allergies  Patient Measurements: Height: 5\' 7"  (170.2 cm) Weight: 188 lb 7.9 oz (85.5 kg) IBW/kg (Calculated) : 66.1  Heparin Dosing Weight: 83.6kg  Vital Signs: Temp: 98.4 F (36.9 C) (07/11 0545) Temp src: Oral (07/11 0545) BP: 124/84 mmHg (07/11 0545) Pulse Rate: 81  (07/11 0545)  Labs:  Basename 06/05/12 1015 06/05/12 0256 06/04/12 1949 06/04/12 1025 06/04/12 0500 06/04/12 0458 06/03/12 2002 06/03/12 1314 06/03/12 1313  HGB -- 17.4* -- -- 16.0 -- -- -- --  HCT -- 49.4 -- -- 47.8 -- -- 48.1 --  PLT -- 186 -- -- 157 -- -- 157 --  APTT -- -- -- -- -- -- -- 24 --  LABPROT -- 17.8* -- 16.8* -- -- -- 16.3* --  INR -- 1.44 -- 1.34 -- -- -- 1.29 --  HEPARINUNFRC 0.24* 0.17* <0.10* -- -- -- -- -- --  CREATININE -- 1.01 -- -- 1.06 -- -- 0.98 --  CKTOTAL -- -- -- -- -- 107 136 -- 126  CKMB -- -- -- -- -- 1.9 1.9 -- 2.3  TROPONINI -- -- -- -- -- <0.30 <0.30 -- <0.30    Estimated Creatinine Clearance: 96.5 ml/min (by C-G formula based on Cr of 1.01).   Medications:  Scheduled:     . aspirin EC  81 mg Oral Daily  . carvedilol  3.125 mg Oral BID WC  . digoxin  125 mcg Oral Daily  . furosemide  40 mg Intravenous Q8H  . lisinopril  5 mg Oral Daily  . pneumococcal 23 valent vaccine  0.5 mL Intramuscular Once  . sodium chloride  3 mL Intravenous Q12H  . spironolactone  25 mg Oral Daily  . warfarin  12.5 mg Oral ONCE-1800  . Warfarin - Pharmacist Dosing Inpatient   Does not apply q1800  . DISCONTD: furosemide  40 mg Intravenous BID    Assessment: 46 yo male admitted 06/03/12 with increased SOB lasting 2-3 days. Hx of HF, afib, and non-Q wave MI. States has had been eating salty food before admission. On Coumadin PTA, subtherapeutic on admission. Currently bridging with heparin. INR slowly increasing. Suspected lab error in initial heparin level >2.   Held heparin and level <0.1. Heparin level 6 hours after adjustment to 1500 units/hr, still subtherapeutic.    Goal of Therapy:  INR 2-3 Heparin level 0.3-0.7 units/ml Monitor platelets by anticoagulation protocol: Yes   Plan:  1. Bolus heparin 1000 units. 2. Increase IV heparin to 1650 units/hr. 3. Check heparin level in 6 hrs. 4. Continue daily heparin level, INR,CBC.    Doris Cheadle, PharmD Clinical Pharmacist Pager: [phone] Phone: [phone] 06/05/2012 1:22 PM

## 2012-06-06 LAB — CBC
MCH: 31.8 pg (ref 26.0–34.0)
MCV: 92.1 fL (ref 78.0–100.0)
Platelets: 169 10*3/uL (ref 150–400)
RDW: 14.5 % (ref 11.5–15.5)

## 2012-06-06 LAB — BASIC METABOLIC PANEL
BUN: 13 mg/dL (ref 6–23)
CO2: 28 mEq/L (ref 19–32)
Calcium: 9.1 mg/dL (ref 8.4–10.5)
Creatinine, Ser: 0.96 mg/dL (ref 0.50–1.35)
Glucose, Bld: 108 mg/dL — ABNORMAL HIGH (ref 70–99)

## 2012-06-06 LAB — PRO B NATRIURETIC PEPTIDE: Pro B Natriuretic peptide (BNP): 2750 pg/mL — ABNORMAL HIGH (ref 0–125)

## 2012-06-06 MED ORDER — WARFARIN SODIUM 2.5 MG PO TABS
12.5000 mg | ORAL_TABLET | Freq: Once | ORAL | Status: AC
  Administered 2012-06-06: 12.5 mg via ORAL
  Filled 2012-06-06: qty 1

## 2012-06-06 NOTE — Progress Notes (Signed)
ANTICOAGULATION CONSULT NOTE - Follow Up Consult  Pharmacy Consult for Heparin and Warfarin Indication: hx of AFib/NQWMI  No Known Allergies  Patient Measurements: Height: 5\' 7"  (170.2 cm) Weight: 188 lb 7.9 oz (85.5 kg) IBW/kg (Calculated) : 66.1  Heparin Dosing Weight: 83.6kg  Vital Signs: Temp: 97.9 F (36.6 C) (07/12 0500) BP: 119/80 mmHg (07/12 0500) Pulse Rate: 72  (07/12 0500)  Labs:  Basename 06/06/12 0600 06/05/12 2016 06/05/12 1015 06/05/12 0256 06/04/12 1025 06/04/12 0500 06/04/12 0458 06/03/12 2002 06/03/12 1314 06/03/12 1313  HGB 17.4* -- -- 17.4* -- -- -- -- -- --  HCT 50.4 -- -- 49.4 -- 47.8 -- -- -- --  PLT 169 -- -- 186 -- 157 -- -- -- --  APTT -- -- -- -- -- -- -- -- 24 --  LABPROT 21.3* -- -- 17.8* 16.8* -- -- -- -- --  INR 1.81* -- -- 1.44 1.34 -- -- -- -- --  HEPARINUNFRC 0.61 0.74* 0.24* -- -- -- -- -- -- --  CREATININE 0.96 -- -- 1.01 -- 1.06 -- -- -- --  CKTOTAL -- -- -- -- -- -- 107 136 -- 126  CKMB -- -- -- -- -- -- 1.9 1.9 -- 2.3  TROPONINI -- -- -- -- -- -- <0.30 <0.30 -- <0.30    Estimated Creatinine Clearance: 101.6 ml/min (by C-G formula based on Cr of 0.96).   Medications:  Scheduled:     . aspirin EC  81 mg Oral Daily  . carvedilol  3.125 mg Oral BID WC  . digoxin  125 mcg Oral Daily  . furosemide  40 mg Intravenous Q8H  . heparin  1,000 Units Intravenous Once  . lisinopril  5 mg Oral Daily  . sodium chloride  3 mL Intravenous Q12H  . spironolactone  25 mg Oral Daily  . warfarin  15 mg Oral ONCE-1800  . Warfarin - Pharmacist Dosing Inpatient   Does not apply q1800  . DISCONTD: furosemide  40 mg Intravenous BID    Assessment: 46 yo male admitted 06/03/12 with increased SOB lasting 2-3 days. Hx of HF, afib, and non-Q wave MI. States has had been eating salty food before admission.   Anticoag: On Coumadin PTA (home 10mg /d) with INR 1.29, subtherapeutic on admission. Currently bridging with heparin. Heparin level 0.61 in goal. INR  up to 1.81. CBC stable.  Cardiovascular- NICM, cardiogenic NQWMI, HTN, nonsustained VTach, paroxysmal AFib, decompensated systolic HF. EF ~15%. Does have ICD. No statin??  Meds: carvediolol, digoxin, ASA 81mg , furosemide, lisinopril and sprionolactone.  Endocrinology-none  Gastrointestinal / Nutrition- none  Neurology- none  Nephrology- SCr 0.96; CrCl > 101mL/min  Pulmonary- acute pulmonary edema upon admission; on lasix with improvement of symptoms.  Hematology / Oncology- none  PTA Medication Issues- none  Best Practices- heparin and warfarin   Goal of Therapy:  INR 2-3 Heparin level 0.3-0.7 units/ml Monitor platelets by anticoagulation protocol: Yes   Plan:  Continue heparin at 1600 units/hr Coumadin 12.5mg  po x 1 tonight.   Fender Herder S. Merilynn Finland, PharmD, [facility] Clinical Staff Pharmacist Pager [phone]  06/06/2012 8:29 AM

## 2012-06-06 NOTE — Progress Notes (Signed)
Subjective:  Patient denies any chest pain states breathing has improved. Tolerating Coumadin without problems INR still remains subtherapeutic Objective:  Vital Signs in the last 24 hours: Temp:  [97.5 F (36.4 C)-98.1 F (36.7 C)] 97.5 F (36.4 C) (07/12 1430) Pulse Rate:  [72-80] 75  (07/12 1712) Resp:  [17-18] 18  (07/12 1430) BP: (106-126)/(74-80) 126/78 mmHg (07/12 1712) SpO2:  [97 %-98 %] 98 % (07/12 1430)  Intake/Output from previous day: 07/11 0701 - 07/12 0700 In: 120 [P.O.:120] Out: 2050 [Urine:2050] Intake/Output from this shift: Total I/O In: 240 [P.O.:240] Out: 1425 [Urine:1425]  Physical Exam: Neck: no adenopathy, no carotid bruit, no JVD and supple, symmetrical, trachea midline Lungs: Decreased breath sound at bases with bibasilar Rales Heart: regular rate and rhythm, S1, S2 normal and Soft systolic murmur and S3 gallop noted Abdomen: soft, non-tender; bowel sounds normal; no masses,  no organomegaly Extremities: extremities normal, atraumatic, no cyanosis or edema  Lab Results:  Basename 06/06/12 0600 06/05/12 0256  WBC 8.9 10.1  HGB 17.4* 17.4*  PLT 169 186    Basename 06/06/12 0600 06/05/12 0256  NA 136 135  K 3.9 4.2  CL 96 93*  CO2 28 30  GLUCOSE 108* 124*  BUN 13 16  CREATININE 0.96 1.01    Basename 06/04/12 0458 06/03/12 2002  TROPONINI <0.30 <0.30   Hepatic Function Panel No results found for this basename: PROT,ALBUMIN,AST,ALT,ALKPHOS,BILITOT,BILIDIR,IBILI in the last 72 hours No results found for this basename: CHOL in the last 72 hours No results found for this basename: PROTIME in the last 72 hours  Imaging: Imaging results have been reviewed and No results found.  Cardiac Studies:  Assessment/Plan:  Resolving Acute systolic congestive heart failure secondary to noncompliance to medication  Severe nonischemic dilated cardiomyopathy  History of cardiogenic non-Q-wave myocardial infarction  Hypertension  History of  nonsustained VT  History of paroxysmal A. fib  History of polysubstance abuse  History of alcohol abuse  History of tobacco abuse  Plan Check labs in a.m. Check chest x-ray in a.m.  LOS: 3 days    Karcyn Menn N 06/06/2012, 5:22 PM

## 2012-06-07 ENCOUNTER — Inpatient Hospital Stay (HOSPITAL_COMMUNITY): Payer: Medicaid Other

## 2012-06-07 LAB — CBC
HCT: 52.1 % — ABNORMAL HIGH (ref 39.0–52.0)
Hemoglobin: 18 g/dL — ABNORMAL HIGH (ref 13.0–17.0)
MCHC: 34.5 g/dL (ref 30.0–36.0)

## 2012-06-07 LAB — BASIC METABOLIC PANEL
BUN: 19 mg/dL (ref 6–23)
Chloride: 94 mEq/L — ABNORMAL LOW (ref 96–112)
GFR calc Af Amer: >90 mL/min (ref >90)
Potassium: 4 mEq/L (ref 3.5–5.1)

## 2012-06-07 LAB — PROTIME-INR: INR: 2.36 — ABNORMAL HIGH (ref 0.00–1.49)

## 2012-06-07 MED ORDER — WARFARIN SODIUM 10 MG PO TABS
10.0000 mg | ORAL_TABLET | Freq: Once | ORAL | Status: DC
  Filled 2012-06-07: qty 1

## 2012-06-07 MED ORDER — ASPIRIN 81 MG PO TBEC: 81.0000 mg | DELAYED_RELEASE_TABLET | Freq: Every day | ORAL | Status: DC

## 2012-06-07 NOTE — Discharge Summary (Signed)
NAME:  Parker Johnson, Parker Johnson NO.:  [id number]  MEDICAL RECORD NO.:  [id number]  LOCATION:  3709                         FACILITY:  [facility]  PHYSICIAN:  Eduardo Osier. Sharyn Lull, M.D. DATE OF BIRTH:  1966/08/21  DATE OF ADMISSION:  06/03/2012 DATE OF DISCHARGE:  06/07/2012                              DISCHARGE SUMMARY   ADMITTING DIAGNOSES: 1. Acute systolic congestive heart failure secondary to noncompliance     to medication. 2. Severe nonischemic dilated cardiomyopathy. 3. History of cardiogenic non-Q-wave myocardial infarction. 4. Hypertension. 5. History of nonsustained ventricular tachycardia. 6. History of paroxysmal atrial fibrillation. 7. History of polysubstance abuse in the past. 8. History of alcohol abuse in the past. 9. History of tobacco abuse.  DISCHARGE DIAGNOSES: 1. Compensated systolic heart failure. 2. Severe nonischemic dilated cardiomyopathy. 3. Status post nonsustained ventricular tachycardia, asymptomatic. 4. History of cardiogenic non-Q-wave myocardial infarction in the     past. 5. Hypertension. 6. History of paroxysmal atrial fibrillation in the past. 7. History of polysubstance abuse. 8. History of alcohol abuse. 9. History of tobacco abuse.  DISCHARGE HOME MEDICATIONS: 1. Enteric-coated aspirin 81 mg 1 tablet daily. 2. Carvedilol 12.5 mg 1 tablet twice daily. 3. Digoxin 0.125 mg 1 tablet daily. 4. Furosemide 40 mg two tablets twice daily. 5. Lisinopril 20 mg daily. 6. Spironolactone 25 mg daily. 7. Warfarin 10 mg daily.  DIET:  Low-salt, low-cholesterol.  The patient has been advised to restrict his fluid to 1 liter per 24 hours and monitor weight daily.  Heart failure instructions have been given.  The patient has been advised to check PT/INR in 1 week.  The patient also has been advised to take his medication regularly.  Follow up with me in 1 week and EP as scheduled.  CONDITION AT DISCHARGE:  Stable.  BRIEF HISTORY AND  HOSPITAL COURSE:  Parker Johnson is 46 year old male with past medical history is significant for multiple medical problems, i.e., severe nonischemic dilated cardiomyopathy, history of cardiogenic non-Q- wave myocardial infarction, hypertension, history of nonsustained VT, history of paroxysmal AFib, history of alcohol abuse, polysubstance abuse and tobacco abuse in the past, history of recurrent compensated systolic heart failure, EF approximately 15%.  He came to the ER complaining of progressive increasing shortness of breath for the last 2- 3 days.  Last night, he could not sleep.  The patient also complaining of cough and could, but denies any fever.  Also, denies any sore throat. The patient does give history of PND, orthopnea, but denies any leg swelling.  Denies any chest pain, nausea, vomiting, diaphoresis.  Denies palpitation, lightheadedness or syncope.  The patient states he has been eating salty food for the last few days and has missed Coumadin.  The patient was noted to be in acute pulmonary edema, requiring BiPAP and IV Lasix with improvement in his symptoms.  The patient states he recently had interrogation of his ICD and denies any ICD discharges.  PAST MEDICAL HISTORY:  As above.  PAST SURGICAL HISTORY:  He had ICD placed in 2010.  The patient denies any drugs, tobacco, or alcohol abuse recently.  PHYSICAL EXAMINATION:  VITAL SIGNS:  His blood pressure was 124/78,  pulse was 76.  He was afebrile. HEENT:  Conjunctiva was pink. NECK:  Supple.  Positive JVD. LUNGS:  He had decreased breath sound at bases with bibasilar rales. CARDIOVASCULAR:  Regular rate and rhythm.  S1 and S2 were soft.  There was soft systolic murmur and S3 gallop. ABDOMEN:  Soft.  Bowel sounds were present.  Nontender. EXTREMITIES:  There was no clubbing, cyanosis, or edema.  LABORATORY DATA:  His sodium was 138, potassium 4.0, BUN 12, creatinine 1.05.  His three sets of cardiac enzymes were normal.   His proBNP was 6857, repeat proBNP on June 04, 2012 was 5969.  Yesterday, proBNP was 2750, today is 1904, which is trending down.  His labs today; sodium is 136, potassium 4.0, BUN 19, creatinine 1.06.  His hemoglobin is 18, hematocrit 52.1, white count of 8.0.  His PT/INR at the admission was 16.9, INR of 1.35.  Today, PT is 26.2, INR 2.36.  His admission chest x-ray showed vascular congestion with cardiomegaly with mild increased interstitial marking, question of interstitial edema.  Repeat chest x-ray done today showed stable cardiomegaly with prominent lung markings, no evidence of frank pulmonary edema.  His EKG showed normal sinus rhythm with left ventricular hypertrophy, poor R-wave progression in V1 through V4.  BRIEF HOSPITAL COURSE:  The patient was admitted to telemetry unit, was started on IV Lasix and continued on his usual medications and also was started on IV heparin and Coumadin per pharmacy protocol.  The patient did not have any episodes of chest pain during the hospital stay, had occasional brief episodes of few beats of nonsustained VT.  The patient did not have any ICD discharges.  The patient remained asymptomatic.  His INR today is in therapeutic range, his proBNP is trending down.  The patient is eager to go home and will be discharge home on above medications and will be followed up in my office in 1 week.     Eduardo Osier. Sharyn Lull, M.D.     [facility]  D:  06/07/2012  T:  06/07/2012  Job:  [id number]

## 2012-06-07 NOTE — Progress Notes (Signed)
ANTICOAGULATION CONSULT NOTE - Follow Up Consult  Pharmacy Consult for Heparin and Warfarin Indication: hx of AFib/NQWMI  No Known Allergies  Patient Measurements: Height: 5\' 7"  (170.2 cm) Weight: 188 lb 7.9 oz (85.5 kg) IBW/kg (Calculated) : 66.1  Heparin Dosing Weight: 83.6kg  Vital Signs: Temp: 97.5 F (36.4 C) (07/13 0500) Temp src: Oral (07/13 0500) BP: 115/81 mmHg (07/13 0500) Pulse Rate: 71  (07/13 0500)  Labs:  Basename 06/07/12 0645 06/06/12 0600 06/05/12 2016 06/05/12 0256  HGB 18.0* 17.4* -- --  HCT 52.1* 50.4 -- 49.4  PLT 200 169 -- 186  APTT -- -- -- --  LABPROT 26.2* 21.3* -- 17.8*  INR 2.36* 1.81* -- 1.44  HEPARINUNFRC 0.92* 0.61 0.74* --  CREATININE 1.06 0.96 -- 1.01  CKTOTAL -- -- -- --  CKMB -- -- -- --  TROPONINI -- -- -- --    Estimated Creatinine Clearance: 92 ml/min (by C-G formula based on Cr of 1.06).   Medications:  Scheduled:     . aspirin EC  81 mg Oral Daily  . carvedilol  3.125 mg Oral BID WC  . digoxin  125 mcg Oral Daily  . furosemide  40 mg Intravenous Q8H  . lisinopril  5 mg Oral Daily  . sodium chloride  3 mL Intravenous Q12H  . spironolactone  25 mg Oral Daily  . warfarin  12.5 mg Oral ONCE-1800  . Warfarin - Pharmacist Dosing Inpatient   Does not apply q1800    Assessment: 46 yo male admitted 06/03/12 with increased SOB lasting 2-3 days. Hx of HF, afib, and non-Q wave MI. States has had been eating salty food before admission.   Anticoag: On Coumadin PTA (home 10mg /d) with INR 1.29, subtherapeutic on admission. Currently bridging with heparin. CBC stable. Heparin level 0.92, supratherapeutic today. INR up to 2.36, at goal today. D/C heparin today. Resume home dose warfarin, 10 mg x1 tonight.   Cardiovascular- NICM, cardiogenic NQWMI, HTN, nonsustained VTach, paroxysmal AFib, decompensated systolic HF. EF ~15%. Does have ICD. No statin??   16 beat run v-tach overnight. Meds: carvediolol, digoxin, ASA 81mg , furosemide,  lisinopril and sprionolactone.  Endocrinology- No hx. CBGs <130.   Gastrointestinal / Nutrition- HH diet.  Neurology- none  Nephrology- SCr 1.06; CrCl > 69mL/min  Pulmonary- acute pulmonary edema upon admission; on lasix with improvement of symptoms.  Hematology / Oncology- none  PTA Medication Issues- none  Best Practices- warfarin   Goal of Therapy:  INR goal 2-3   Plan:  Discontinue heparin.  Coumadin 10 mg po x 1 tonight.  Doris Cheadle, PharmD Clinical Pharmacist Pager: [phone] Phone: [phone] 06/07/2012 8:34 AM

## 2012-06-07 NOTE — Discharge Summary (Signed)
  Discharge summary dictated on July 13 dictation number is [phone]

## 2012-06-07 NOTE — Progress Notes (Signed)
Pt with increasing frequency of ectopy most recent being 16 beat run v-tach.  Pt with AICD. BP 118/70 HR 94 Pt denies any CP, SOB, palpitations.  Pt states "I feel fine." MD notified.  Order received.  Will continue to monitor patient.

## 2012-08-07 ENCOUNTER — Encounter: Payer: Medicaid Other | Admitting: *Deleted

## 2012-08-13 ENCOUNTER — Encounter: Payer: Self-pay | Admitting: *Deleted

## 2012-12-04 ENCOUNTER — Encounter: Payer: Self-pay | Admitting: *Deleted

## 2013-01-23 ENCOUNTER — Encounter: Payer: Self-pay | Admitting: Internal Medicine

## 2013-01-23 ENCOUNTER — Ambulatory Visit (INDEPENDENT_AMBULATORY_CARE_PROVIDER_SITE_OTHER): Payer: Medicaid Other | Admitting: Cardiology

## 2013-01-23 DIAGNOSIS — I2589 Other forms of chronic ischemic heart disease: Secondary | ICD-10-CM

## 2013-01-23 DIAGNOSIS — I255 Ischemic cardiomyopathy: Secondary | ICD-10-CM

## 2013-01-23 DIAGNOSIS — Z95810 Presence of automatic (implantable) cardiac defibrillator: Secondary | ICD-10-CM

## 2013-01-23 DIAGNOSIS — I472 Ventricular tachycardia: Secondary | ICD-10-CM

## 2013-01-23 DIAGNOSIS — I5022 Chronic systolic (congestive) heart failure: Secondary | ICD-10-CM

## 2013-01-23 LAB — ICD DEVICE OBSERVATION
AL THRESHOLD: 0.6 V
ATRIAL PACING ICD: 0 pct
BAMS-0002: 8 ms
DEV-0020ICD: NEGATIVE
DEVICE MODEL ICD: 143406
HV IMPEDENCE: 56 Ohm
PACEART VT: 0
RV LEAD AMPLITUDE: 24.6 mv
TZAT-0001FASTVT: 2
TZAT-0001SLOWVT: 2
TZAT-0004SLOWVT: 4
TZAT-0005SLOWVT: 81 pct
TZAT-0012FASTVT: 220 ms
TZAT-0013FASTVT: 4
TZAT-0013FASTVT: 4
TZAT-0013SLOWVT: 4
TZAT-0018FASTVT: NEGATIVE
TZAT-0018SLOWVT: NEGATIVE
TZAT-0018SLOWVT: NEGATIVE
TZAT-0019FASTVT: 5 V
TZAT-0019SLOWVT: 5 V
TZAT-0019SLOWVT: 5 V
TZAT-0020FASTVT: 1 ms
TZAT-0020FASTVT: 1 ms
TZAT-0020SLOWVT: 1 ms
TZON-0003FASTVT: 300 ms
TZON-0003SLOWVT: 353 ms
TZON-0004FASTVT: 10
TZON-0004SLOWVT: 20
TZON-0005FASTVT: 1
TZON-0005SLOWVT: 1
TZST-0001FASTVT: 3
TZST-0001FASTVT: 5
TZST-0001FASTVT: 7
TZST-0001SLOWVT: 4
TZST-0001SLOWVT: 7
TZST-0003FASTVT: 41 J
TZST-0003FASTVT: 41 J
TZST-0003FASTVT: 41 J
TZST-0003SLOWVT: 41 J
VENTRICULAR PACING ICD: 0 pct

## 2013-01-23 NOTE — Progress Notes (Signed)
ICD check/device clinic visit. See Pace[employer] report. 

## 2013-01-23 NOTE — Patient Instructions (Addendum)
Your physician recommends that you schedule a follow-up appointment in: late May or early June with Dr Graciela Husbands

## 2013-01-25 ENCOUNTER — Emergency Department (HOSPITAL_COMMUNITY)
Admission: EM | Admit: 2013-01-25 | Discharge: 2013-01-25 | Disposition: A | Discharge status: Home / Self Care | Payer: Medicaid Other | Source: Home / Self Care | Attending: Emergency Medicine | Admitting: Emergency Medicine

## 2013-01-25 ENCOUNTER — Encounter (HOSPITAL_COMMUNITY): Payer: Self-pay | Admitting: *Deleted

## 2013-01-25 DIAGNOSIS — M109 Gout, unspecified: Secondary | ICD-10-CM

## 2013-01-25 MED ORDER — OXYCODONE-ACETAMINOPHEN 5-325 MG PO TABS: 1.0000 | ORAL_TABLET | ORAL | Status: DC | PRN

## 2013-01-25 MED ORDER — COLCHICINE 0.6 MG PO TABS: 0.6000 mg | ORAL_TABLET | Freq: Two times a day (BID) | ORAL | Status: DC

## 2013-01-25 MED ORDER — PREDNISONE 50 MG PO TABS: ORAL_TABLET | ORAL | Status: DC

## 2013-01-25 NOTE — ED Notes (Signed)
Woke up last Sunday with L foot pain. Could not get his shoe on due to swelling.  Taking Aleve with some relief.  Pain, swelling and discoloration to L great toe joint.

## 2013-01-25 NOTE — ED Provider Notes (Signed)
History     CSN: [id number]  Arrival date & time 01/25/13  1601   First MD Initiated Contact with Patient 01/25/13 1733      Chief Complaint  Patient presents with  . Foot Pain    HPI: Patient is a 47 y.o. male presenting with lower extremity pain. The history is provided by the patient.  Foot Pain This is a new problem. The current episode started more than 2 days ago. The problem occurs constantly. The problem has been gradually worsening. The symptoms are aggravated by walking. Nothing relieves the symptoms.  Pt reports onset of pain and swelling to the (L) great toe 1 week ago. He has been unable to put the foot in any shoe other than his house slipper. Over the last 2 days he has noted persistent swelling and redness to the area and worsening pain. It is pain ful to berar weight. Pt admits he had a similar episode several yrs ago that resolved after a day or two. Denies injury or fever.   Past Medical History  Diagnosis Date  . Ventricular tachycardia   . AICD (automatic cardioverter/defibrillator) present     [employer]  . Tobacco abuse   . Alcohol abuse     hx  . Cardiomyopathy     secondary  . HTN (hypertension)     uncontrolled  . CHF (congestive heart failure)     Past Surgical History  Procedure Laterality Date  . Cardiac defibrillator placement  2010    [employer]    Family History  Problem Relation Age of Onset  . Cardiomyopathy Father     on a defib  . Diabetes Father     History  Substance Use Topics  . Smoking status: Current Every Day Smoker -- 0.50 packs/day for 30 years    Types: Cigarettes  . Smokeless tobacco: Never Used     Comment: I already have information on quitting"  . Alcohol Use: 0.6 oz/week    1 Cans of beer per week      Review of Systems  All other systems reviewed and are negative.    Allergies  Review of patient's allergies indicates no known allergies.  Home Medications    Current Outpatient Rx  Name  Route  Sig  Dispense  Refill  . carvedilol (COREG) 6.25 MG tablet   Oral   Take 6.25 mg by mouth 2 (two) times daily with a meal.         . digoxin (LANOXIN) 0.25 MG tablet   Oral   Take 0.25 mg by mouth daily.         . furosemide (LASIX) 40 MG tablet   Oral   Take 80 mg by mouth 2 (two) times daily as needed.          Marland Kitchen lisinopril (PRINIVIL,ZESTRIL) 20 MG tablet   Oral   Take 20 mg by mouth daily.           Marland Kitchen spironolactone (ALDACTONE) 25 MG tablet   Oral   Take 25 mg by mouth daily.           Marland Kitchen warfarin (COUMADIN) 10 MG tablet   Oral   Take 10 mg by mouth as directed.            There were no vitals taken for this visit.  Physical Exam  Nursing note and vitals reviewed. Constitutional: He is oriented to person, place, and time. He appears well-developed  and well-nourished.  HENT:  Head: Normocephalic and atraumatic.  Eyes: Conjunctivae are normal.  Neck: Neck supple.  Cardiovascular: Normal rate.   Pulmonary/Chest: Effort normal.  Musculoskeletal:       Feet:  Swelling, erythema and warmth and tenderness to touch to (L) proximal metatarsal joint. No open wound  Neurological: He is alert and oriented to person, place, and time.  Skin: Skin is warm and dry.  Psychiatric: He has a normal mood and affect.    ED Course  Procedures (including critical care time)  Labs Reviewed - No data to display No results found.   No diagnosis found.    MDM  1 week h/o painful, red swollen (L) great toe (at the proximal metatarsal joint). Similar episode several yrs ago that seemed to resolve after 1 to 2 days. Denies injury. Exam findings c/w acute gout. Pt on chronic Coumadin. Discussed with Dr Artis Flock. Will treat w/ prednisone taper, colchicine and short course of med for pain. Pt is to f/u w/ his PCP if not improving over the next several days.         Leanne Chang, NP 01/25/13 1900

## 2013-01-26 NOTE — ED Provider Notes (Signed)
Medical screening examination/treatment/procedure(s) were performed by resident physician or non-physician practitioner and as supervising physician I was immediately available for consultation/collaboration.   KINDL,Demtrius DOUGLAS MD.   Julias D Kindl, MD 01/26/13 1638 

## 2013-03-29 ENCOUNTER — Inpatient Hospital Stay (HOSPITAL_COMMUNITY)
Admission: EM | Admit: 2013-03-29 | Discharge: 2013-04-01 | DRG: 292 | Disposition: A | Discharge status: Home / Self Care | Payer: Medicaid Other | Attending: Cardiology | Admitting: Cardiology

## 2013-03-29 ENCOUNTER — Emergency Department (HOSPITAL_COMMUNITY): Payer: Medicaid Other

## 2013-03-29 ENCOUNTER — Encounter (HOSPITAL_COMMUNITY): Payer: Self-pay | Admitting: Nurse Practitioner

## 2013-03-29 DIAGNOSIS — I4729 Other ventricular tachycardia: Secondary | ICD-10-CM | POA: Diagnosis present

## 2013-03-29 DIAGNOSIS — N179 Acute kidney failure, unspecified: Secondary | ICD-10-CM | POA: Diagnosis present

## 2013-03-29 DIAGNOSIS — I10 Essential (primary) hypertension: Secondary | ICD-10-CM | POA: Diagnosis present

## 2013-03-29 DIAGNOSIS — Z9119 Patient's noncompliance with other medical treatment and regimen: Secondary | ICD-10-CM

## 2013-03-29 DIAGNOSIS — I472 Ventricular tachycardia, unspecified: Secondary | ICD-10-CM | POA: Diagnosis present

## 2013-03-29 DIAGNOSIS — Z91199 Patient's noncompliance with other medical treatment and regimen due to unspecified reason: Secondary | ICD-10-CM

## 2013-03-29 DIAGNOSIS — M109 Gout, unspecified: Secondary | ICD-10-CM | POA: Diagnosis present

## 2013-03-29 DIAGNOSIS — I5021 Acute systolic (congestive) heart failure: Principal | ICD-10-CM | POA: Diagnosis present

## 2013-03-29 DIAGNOSIS — I252 Old myocardial infarction: Secondary | ICD-10-CM

## 2013-03-29 DIAGNOSIS — I4891 Unspecified atrial fibrillation: Secondary | ICD-10-CM | POA: Diagnosis present

## 2013-03-29 DIAGNOSIS — I509 Heart failure, unspecified: Secondary | ICD-10-CM | POA: Diagnosis present

## 2013-03-29 DIAGNOSIS — Z95810 Presence of automatic (implantable) cardiac defibrillator: Secondary | ICD-10-CM

## 2013-03-29 DIAGNOSIS — F17200 Nicotine dependence, unspecified, uncomplicated: Secondary | ICD-10-CM | POA: Diagnosis present

## 2013-03-29 DIAGNOSIS — I428 Other cardiomyopathies: Secondary | ICD-10-CM | POA: Diagnosis present

## 2013-03-29 HISTORY — DX: Gout, unspecified: M10.9

## 2013-03-29 LAB — PROTIME-INR: INR: 1.56 — ABNORMAL HIGH (ref 0.00–1.49)

## 2013-03-29 LAB — MAGNESIUM: Magnesium: 1.8 mg/dL (ref 1.5–2.5)

## 2013-03-29 LAB — CBC WITH DIFFERENTIAL/PLATELET
Eosinophils Absolute: 0.3 10*3/uL (ref 0.0–0.7)
Eosinophils Relative: 6 % — ABNORMAL HIGH (ref 0–5)
Hemoglobin: 14.5 g/dL (ref 13.0–17.0)
Lymphs Abs: 1.8 10*3/uL (ref 0.7–4.0)
MCH: 31.9 pg (ref 26.0–34.0)
MCV: 91 fL (ref 78.0–100.0)
Monocytes Absolute: 0.4 10*3/uL (ref 0.1–1.0)
Monocytes Relative: 7 % (ref 3–12)
Platelets: 188 10*3/uL (ref 150–400)
RBC: 4.55 MIL/uL (ref 4.22–5.81)

## 2013-03-29 LAB — CBC
HCT: 43.6 % (ref 39.0–52.0)
Hemoglobin: 15.4 g/dL (ref 13.0–17.0)
MCH: 32.6 pg (ref 26.0–34.0)
MCHC: 35.3 g/dL (ref 30.0–36.0)
MCV: 92.2 fL (ref 78.0–100.0)

## 2013-03-29 LAB — APTT: aPTT: 29 seconds (ref 24–37)

## 2013-03-29 LAB — COMPREHENSIVE METABOLIC PANEL
BUN: 21 mg/dL (ref 6–23)
Calcium: 9.1 mg/dL (ref 8.4–10.5)
Creatinine, Ser: 1.14 mg/dL (ref 0.50–1.35)
GFR calc Af Amer: 88 mL/min — ABNORMAL LOW (ref >90)
Glucose, Bld: 169 mg/dL — ABNORMAL HIGH (ref 70–99)
Total Protein: 6.8 g/dL (ref 6.0–8.3)

## 2013-03-29 LAB — HEPARIN LEVEL (UNFRACTIONATED): Heparin Unfractionated: 0.37 IU/mL (ref 0.30–0.70)

## 2013-03-29 LAB — TROPONIN I
Troponin I: <0.3 ng/mL (ref <0.30)
Troponin I: <0.3 ng/mL (ref <0.30)

## 2013-03-29 LAB — BASIC METABOLIC PANEL
BUN: 22 mg/dL (ref 6–23)
Calcium: 9 mg/dL (ref 8.4–10.5)
GFR calc non Af Amer: 55 mL/min — ABNORMAL LOW (ref >90)
Glucose, Bld: 113 mg/dL — ABNORMAL HIGH (ref 70–99)

## 2013-03-29 LAB — POCT I-STAT TROPONIN I

## 2013-03-29 LAB — DIGOXIN LEVEL: Digoxin Level: 0.3 ng/mL — ABNORMAL LOW (ref 0.8–2.0)

## 2013-03-29 MED ORDER — HEPARIN BOLUS VIA INFUSION
4000.0000 [IU] | Freq: Once | INTRAVENOUS | Status: AC
  Administered 2013-03-29: 4000 [IU] via INTRAVENOUS
  Filled 2013-03-29: qty 4000

## 2013-03-29 MED ORDER — HEPARIN (PORCINE) IN NACL 100-0.45 UNIT/ML-% IJ SOLN
1700.0000 [IU]/h | INTRAMUSCULAR | Status: DC
  Administered 2013-03-29 (×2): 1300 [IU]/h via INTRAVENOUS
  Administered 2013-03-30 (×2): 1700 [IU]/h via INTRAVENOUS
  Filled 2013-03-29 (×5): qty 250

## 2013-03-29 MED ORDER — WARFARIN - PHARMACIST DOSING INPATIENT: Freq: Every day | Status: DC

## 2013-03-29 MED ORDER — MILRINONE IN DEXTROSE 20 MG/100ML IV SOLN
0.2500 ug/kg/min | INTRAVENOUS | Status: DC
  Administered 2013-03-29 – 2013-03-30 (×3): 0.25 ug/kg/min via INTRAVENOUS
  Filled 2013-03-29 (×4): qty 100

## 2013-03-29 MED ORDER — MILRINONE IN DEXTROSE 20 MG/100ML IV SOLN
0.2500 ug/kg/min | INTRAVENOUS | Status: DC
  Filled 2013-03-29: qty 100

## 2013-03-29 MED ORDER — SODIUM CHLORIDE 0.9 % IV SOLN: 250.0000 mL | INTRAVENOUS | Status: DC | PRN

## 2013-03-29 MED ORDER — ACETAMINOPHEN 325 MG PO TABS: 650.0000 mg | ORAL_TABLET | ORAL | Status: DC | PRN

## 2013-03-29 MED ORDER — DIGOXIN 250 MCG PO TABS
0.2500 mg | ORAL_TABLET | Freq: Every day | ORAL | Status: DC
  Administered 2013-03-29 – 2013-04-01 (×4): 0.25 mg via ORAL
  Filled 2013-03-29 (×4): qty 1

## 2013-03-29 MED ORDER — WARFARIN SODIUM 7.5 MG PO TABS
15.0000 mg | ORAL_TABLET | Freq: Once | ORAL | Status: AC
  Administered 2013-03-29: 15 mg via ORAL
  Filled 2013-03-29: qty 2

## 2013-03-29 MED ORDER — FUROSEMIDE 10 MG/ML IJ SOLN
40.0000 mg | Freq: Two times a day (BID) | INTRAMUSCULAR | Status: DC
  Administered 2013-03-29 – 2013-04-01 (×7): 40 mg via INTRAVENOUS
  Filled 2013-03-29 (×8): qty 4

## 2013-03-29 MED ORDER — ASPIRIN EC 81 MG PO TBEC
81.0000 mg | DELAYED_RELEASE_TABLET | Freq: Every day | ORAL | Status: DC
  Administered 2013-03-29 – 2013-04-01 (×4): 81 mg via ORAL
  Filled 2013-03-29 (×4): qty 1

## 2013-03-29 MED ORDER — LISINOPRIL 5 MG PO TABS
5.0000 mg | ORAL_TABLET | Freq: Every day | ORAL | Status: DC
  Administered 2013-03-29 – 2013-04-01 (×4): 5 mg via ORAL
  Filled 2013-03-29 (×4): qty 1

## 2013-03-29 MED ORDER — ONDANSETRON HCL 4 MG/2ML IJ SOLN: 4.0000 mg | Freq: Four times a day (QID) | INTRAMUSCULAR | Status: DC | PRN

## 2013-03-29 MED ORDER — SODIUM CHLORIDE 0.9 % IJ SOLN
3.0000 mL | Freq: Two times a day (BID) | INTRAMUSCULAR | Status: DC
  Administered 2013-03-30 – 2013-04-01 (×5): 3 mL via INTRAVENOUS

## 2013-03-29 MED ORDER — FUROSEMIDE 10 MG/ML IJ SOLN
40.0000 mg | Freq: Once | INTRAMUSCULAR | Status: AC
  Administered 2013-03-29: 40 mg via INTRAVENOUS
  Filled 2013-03-29: qty 4

## 2013-03-29 MED ORDER — SODIUM CHLORIDE 0.9 % IJ SOLN: 3.0000 mL | INTRAMUSCULAR | Status: DC | PRN

## 2013-03-29 MED ORDER — CARVEDILOL 6.25 MG PO TABS
6.2500 mg | ORAL_TABLET | Freq: Two times a day (BID) | ORAL | Status: DC
  Administered 2013-03-29 – 2013-04-01 (×6): 6.25 mg via ORAL
  Filled 2013-03-29 (×9): qty 1

## 2013-03-29 NOTE — Progress Notes (Signed)
ANTICOAGULATION CONSULT NOTE - Initial Consult  Pharmacy Consult for Heparin and Coumadin, Milrinone Indication: atrial fibrillation and acute decompensated hearf failure  No Known Allergies  Patient Measurements: Height: 5\' 7"  (170.2 cm) Weight: 194 lb (87.998 kg) (scale b) IBW/kg (Calculated) : 66.1 Heparin Dosing Weight: 88 kg  Vital Signs: Temp: 97.5 F (36.4 C) (05/04 0625) Temp src: Oral (05/04 0625) BP: 121/86 mmHg (05/04 0625) Pulse Rate: 75 (05/04 0625)  Labs:  Recent Labs  03/29/13 0031 03/29/13 0300 03/29/13 0930  HGB 15.4  --  14.5  HCT 43.6  --  41.4  PLT 214  --  188  APTT  --   --  29  LABPROT  --  18.1* 18.2*  INR  --  1.55* 1.56*  CREATININE 1.48*  --  1.14  TROPONINI  --   --  <0.30    Estimated Creatinine Clearance: 85.8 ml/min (by C-G formula based on Cr of 1.14).   Medical History: Past Medical History  Diagnosis Date  . Ventricular tachycardia   . AICD (automatic cardioverter/defibrillator) present     [employer]  . Tobacco abuse   . Alcohol abuse     hx  . Cardiomyopathy     secondary  . HTN (hypertension)     uncontrolled  . CHF (congestive heart failure)   . Gout    Assessment:   Admitted overnight with progressive shortness of breath, acute decompensated systolic heart failure.  Has ICD. Rx asked to assist with Milrinone initiation as well as anticoagulation.  INR is subtherapeutic.  Takes Coumadin 10 mg daily, but missed dose on 5/3.  He relates last INR check about 6 weeks ago, dose had been consistent.    Goal of Therapy:  INR 2-3 Heparin level 0.3-0.7 units/ml Monitor platelets by anticoagulation protocol: Yes   Plan:   Heparin 4000 units IV bolus, then 1300 units/hr drip.  Heparin level ~6 hrs after drip begins.  Coumadin 15 mg x 1 today.    Daily heparin level, PT/INR and CBC.    Milrinone 0.25 mcg/kg/minute.  No bolus.   Standard monitoring per Milrinone order set. Vitals q2h x 3, then q4hrs if  stable. RN to notify MD for new arrhythmias or if SBP <80. Dosing adjustments per MD.     Daily bmet. Daily weights.    Dennie Fetters, RPh Pager: [phone] 03/29/2013,10:37 AM

## 2013-03-29 NOTE — ED Provider Notes (Signed)
History     CSN: [id number]  Arrival date & time 03/29/13  0024   First MD Initiated Contact with Patient 03/29/13 [phone]      Chief Complaint  Patient presents with  . Shortness of Breath    (Consider location/radiation/quality/duration/timing/severity/associated sxs/prior treatment) HPI Complains of shortness of breath gradual onset 2 days ago worse with exertion improved during still he admits to noncompliance with diet. He denies noncompliance with medications. He denies chest pain he does admit to slight right shoulder pain radiating to his right arm over the past 2 days. No pain at present . No other associated symptoms. Symptoms feel like congestive heart failure he's had in the past Past Medical History  Diagnosis Date  . Ventricular tachycardia   . AICD (automatic cardioverter/defibrillator) present     [employer]  . Tobacco abuse   . Alcohol abuse     hx  . Cardiomyopathy     secondary  . HTN (hypertension)     uncontrolled  . CHF (congestive heart failure)     Past Surgical History  Procedure Laterality Date  . Cardiac defibrillator placement  2010    [employer]    Family History  Problem Relation Age of Onset  . Cardiomyopathy Father     on a defib  . Diabetes Father     History  Substance Use Topics  . Smoking status: Current Every Day Smoker -- 0.50 packs/day for 30 years    Types: Cigarettes  . Smokeless tobacco: Never Used     Comment: I already have information on quitting"  . Alcohol Use: 0.6 oz/week    1 Cans of beer per week      Review of Systems  Constitutional: Negative.   HENT: Negative.   Respiratory: Positive for shortness of breath.   Cardiovascular: Negative.  Negative for chest pain.  Gastrointestinal: Negative.   Musculoskeletal: Positive for arthralgias.       Right shoulder pain radiating to right arm  Skin: Negative.   Neurological: Negative.   Psychiatric/Behavioral: Negative.      Allergies  Review of patient's allergies indicates no known allergies.  Home Medications   Current Outpatient Rx  Name  Route  Sig  Dispense  Refill  . carvedilol (COREG) 6.25 MG tablet   Oral   Take 6.25 mg by mouth 2 (two) times daily with a meal.         . colchicine 0.6 MG tablet   Oral   Take 1 tablet (0.6 mg total) by mouth 2 (two) times daily.   14 tablet   0   . digoxin (LANOXIN) 0.25 MG tablet   Oral   Take 0.25 mg by mouth daily.         . furosemide (LASIX) 40 MG tablet   Oral   Take 80 mg by mouth 2 (two) times daily as needed.          Marland Kitchen lisinopril (PRINIVIL,ZESTRIL) 20 MG tablet   Oral   Take 20 mg by mouth daily.           Marland Kitchen oxyCODONE-acetaminophen (PERCOCET/ROXICET) 5-325 MG per tablet   Oral   Take 1 tablet by mouth every 4 (four) hours as needed for pain.   10 tablet   0   . predniSONE (DELTASONE) 50 MG tablet      Take 50 mg daily x 2 then 1/2 tablet on days 3 and 4   4 tablet  0   . spironolactone (ALDACTONE) 25 MG tablet   Oral   Take 25 mg by mouth daily.           Marland Kitchen warfarin (COUMADIN) 10 MG tablet   Oral   Take 10 mg by mouth as directed.            BP 102/84  Pulse 80  Temp(Src) 97.6 F (36.4 C) (Oral)  Resp 20  SpO2 100%  Physical Exam  Nursing note and vitals reviewed. Constitutional: He appears well-developed and well-nourished. No distress.  HENT:  Head: Normocephalic and atraumatic.  Eyes: Conjunctivae are normal. Pupils are equal, round, and reactive to light.  Neck: Neck supple. JVD present. No tracheal deviation present. No thyromegaly present.  Cardiovascular: Normal rate and regular rhythm.   No murmur heard. Pulmonary/Chest: Effort normal.  Mild rales at bases no respiratory  Abdominal: Soft. Bowel sounds are normal. He exhibits no distension. There is no tenderness.  Musculoskeletal: Normal range of motion. He exhibits no edema and no tenderness.  Neurological: He is alert. Coordination  normal.  Skin: Skin is warm and dry. No rash noted.  Psychiatric: He has a normal mood and affect.    ED Course  Procedures (including critical care time)  Labs Reviewed  CBC - Abnormal; Notable for the following:    RDW 15.9 (*)    All other components within normal limits  BASIC METABOLIC PANEL - Abnormal; Notable for the following:    Glucose, Bld 113 (*)    Creatinine, Ser 1.48 (*)    GFR calc non Af Amer 55 (*)    GFR calc Af Amer 64 (*)    All other components within normal limits  PRO B NATRIURETIC PEPTIDE - Abnormal; Notable for the following:    Pro B Natriuretic peptide (BNP) 5779.0 (*)    All other components within normal limits  POCT I-STAT TROPONIN I   Dg Chest 2 View  03/29/2013  *RADIOLOGY REPORT*  Clinical Data: Shortness of breath  CHEST - 2 VIEW  Comparison: 06/07/2012  Findings: Cardiomegaly.  Central vascular congestion.  Mild perihilar increased interstitial markings.  No confluent airspace opacity.  No pleural effusion or pneumothorax.  Left chest wall battery pack with unchanged lead tip positions.  No acute osseous finding.  IMPRESSION: Cardiomegaly with central vascular congestion.  Increased interstitial markings are similar to prior.  No overt edema or focal consolidation.   Original Report Authenticated By: Jearld Lesch, M.D.      No diagnosis found.    Date: 03/29/2013  Rate: 80  Rhythm: Ectopic atrial rhythm  QRS Axis: right  Intervals: normal  ST/T Wave abnormalities: Poor progression, PVCs  Conduction Disutrbances:none  Narrative Interpretation:   Old EKG Reviewed: PVCsPrevious tracing otherwise unchanged from 06/03/2012 interpreted by me Results for orders placed during the hospital encounter of 03/29/13  CBC      Result Value Range   WBC 6.2  4.0 - 10.5 K/uL   RBC 4.73  4.22 - 5.81 MIL/uL   Hemoglobin 15.4  13.0 - 17.0 g/dL   HCT 16.1  09.6 - 04.5 %   MCV 92.2  78.0 - 100.0 fL   MCH 32.6  26.0 - 34.0 pg   MCHC 35.3  30.0 - 36.0  g/dL   RDW 40.9 (*) 81.1 - 91.4 %   Platelets 214  150 - 400 K/uL  BASIC METABOLIC PANEL      Result Value Range   Sodium 139  135 -  145 mEq/L   Potassium 4.4  3.5 - 5.1 mEq/L   Chloride 99  96 - 112 mEq/L   CO2 29  19 - 32 mEq/L   Glucose, Bld 113 (*) 70 - 99 mg/dL   BUN 22  6 - 23 mg/dL   Creatinine, Ser 1.61 (*) 0.50 - 1.35 mg/dL   Calcium 9.0  8.4 - 09.6 mg/dL   GFR calc non Af Amer 55 (*) >90 mL/min   GFR calc Af Amer 64 (*) >90 mL/min  PRO B NATRIURETIC PEPTIDE      Result Value Range   Pro B Natriuretic peptide (BNP) 5779.0 (*) 0 - 125 pg/mL  PROTIME-INR      Result Value Range   Prothrombin Time 18.1 (*) 11.6 - 15.2 seconds   INR 1.55 (*) 0.00 - 1.49  DIGOXIN LEVEL      Result Value Range   Digoxin Level 0.3 (*) 0.8 - 2.0 ng/mL  POCT I-STAT TROPONIN I      Result Value Range   Troponin i, poc 0.02  0.00 - 0.08 ng/mL   Comment 3            Dg Chest 2 View  03/29/2013  *RADIOLOGY REPORT*  Clinical Data: Shortness of breath  CHEST - 2 VIEW  Comparison: 06/07/2012  Findings: Cardiomegaly.  Central vascular congestion.  Mild perihilar increased interstitial markings.  No confluent airspace opacity.  No pleural effusion or pneumothorax.  Left chest wall battery pack with unchanged lead tip positions.  No acute osseous finding.  IMPRESSION: Cardiomegaly with central vascular congestion.  Increased interstitial markings are similar to prior.  No overt edema or focal consolidation.   Original Report Authenticated By: Jearld Lesch, M.D.   Chest xray viewed by me  At 4:45 AM patient's breathing is improved after treatment with intravenous Lasix. He has produced approximately 450 mL of urine after treatment with Lasix MDM  Spoke with Dr.Harwani Plan 23 hour observation, telemetry Diagnosis #1 congestive heart failure 2 medication noncompliance 3 renal insufficiency        Doug Sou, MD 03/29/13 0454

## 2013-03-29 NOTE — Progress Notes (Signed)
UR Completed.  Parker Johnson Jane [phone] 03/29/2013  

## 2013-03-29 NOTE — Progress Notes (Signed)
Pt. Admitted from ED.  Pt. Alert and oriented.  VSS.  Oriented to room and explained how to use the call bell.  Dr. Sharyn Lull paged to let know pt. In room.

## 2013-03-29 NOTE — Progress Notes (Signed)
ANTICOAGULATION CONSULT NOTE -   Pharmacy Consult for Heparin and Coumadin,  Indication: afib  No Known Allergies  Patient Measurements: Height: 5\' 7"  (170.2 cm) Weight: 194 lb (87.998 kg) (scale b) IBW/kg (Calculated) : 66.1 Heparin Dosing Weight: 88 kg  Vital Signs: Temp: 97.8 F (36.6 C) (05/04 1430) Temp src: Oral (05/04 1430) BP: 108/78 mmHg (05/04 1430) Pulse Rate: 76 (05/04 1430)  Labs:  Recent Labs  03/29/13 0031 03/29/13 0300 03/29/13 0930 03/29/13 1530 03/29/13 1715  HGB 15.4  --  14.5  --   --   HCT 43.6  --  41.4  --   --   PLT 214  --  188  --   --   APTT  --   --  29  --   --   LABPROT  --  18.1* 18.2*  --   --   INR  --  1.55* 1.56*  --   --   HEPARINUNFRC  --   --   --   --  0.37  CREATININE 1.48*  --  1.14  --   --   TROPONINI  --   --  <0.30 <0.30  --     Estimated Creatinine Clearance: 85.8 ml/min (by C-G formula based on Cr of 1.14).   Assessment: 47 yo male here with SOB shortness and noted Acute decompensated systolic heart failure .  Patient on coumadin for afib with INR below goal and patient started on heparin. The initial heparin level is 0.37  Goal of Therapy:  INR 2-3 Heparin level 0.3-0.7 units/ml Monitor platelets by anticoagulation protocol: Yes   Plan:   -No heparin changes needed -Heparin level in am with CBC  Harland German, Pharm D 03/29/2013 5:51 PM

## 2013-03-29 NOTE — ED Notes (Signed)
Pt states SOB, Fullness feeling in epigastric region and right shoulder and arm ache, weakness. Denies N/V, diaphoresis.

## 2013-03-29 NOTE — H&P (Signed)
Parker Johnson is an 47 y.o. male.   Chief Complaint: Progressive increasing shortness of breath HPI: Patient is 47 year old male with past medical history significant for multiple medical problems i.e. 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 States has gained approximately 27 pounds in last 1 year. Denies any chest pain nausea vomiting. Denies palpitation lightheadedness or syncope patient does give history of PND orthopnea and minimal leg swelling. Denies any cough fever chills. Denies any noncompliance to medication.  Past Medical History  Diagnosis Date  . Ventricular tachycardia   . AICD (automatic cardioverter/defibrillator) present     [employer]  . Tobacco abuse   . Alcohol abuse     hx  . Cardiomyopathy     secondary  . HTN (hypertension)     uncontrolled  . CHF (congestive heart failure)   . Gout     Past Surgical History  Procedure Laterality Date  . Cardiac defibrillator placement  2010    [employer] [facility]    Family History  Problem Relation Age of Onset  . Cardiomyopathy Father     on a defib  . Diabetes Father    Social History:  reports that he has been smoking Cigarettes.  He has a 15 pack-year smoking history. He has never used smokeless tobacco. He reports that he drinks about 0.6 ounces of alcohol per week. He reports that he does not use illicit drugs.  Allergies: No  Known Allergies  Medications Prior to Admission  Medication Sig Dispense Refill  . carvedilol (COREG) 6.25 MG tablet Take 6.25 mg by mouth 2 (two) times daily with a meal.      . digoxin (LANOXIN) 0.25 MG tablet Take 0.25 mg by mouth daily.      . furosemide (LASIX) 40 MG tablet Take 120 mg by mouth 2 (two) times daily as needed (for fluid retention).       Marland Kitchen lisinopril (PRINIVIL,ZESTRIL) 20 MG tablet Take 20 mg by mouth daily.        Marland Kitchen spironolactone (ALDACTONE) 25 MG tablet Take 25 mg by mouth daily.        Marland Kitchen warfarin (COUMADIN) 10 MG tablet Take 10 mg by mouth every evening.         Results for orders placed during the hospital encounter of 03/29/13 (from the past 48 hour(s))  CBC     Status: Abnormal   Collection Time    03/29/13 12:31 AM      Result Value Range   WBC 6.2  4.0 - 10.5 K/uL   RBC 4.73  4.22 - 5.81 MIL/uL   Hemoglobin 15.4  13.0 - 17.0 g/dL   HCT 91.4  78.2 - 95.6 %   MCV 92.2  78.0 - 100.0 fL   MCH 32.6  26.0 - 34.0 pg   MCHC 35.3  30.0 - 36.0 g/dL   RDW 21.3 (*) 08.6 - 57.8 %   Platelets 214  150 - 400 K/uL  BASIC METABOLIC PANEL  Status: Abnormal   Collection Time    03/29/13 12:31 AM      Result Value Range   Sodium 139  135 - 145 mEq/L   Potassium 4.4  3.5 - 5.1 mEq/L   Chloride 99  96 - 112 mEq/L   CO2 29  19 - 32 mEq/L   Glucose, Bld 113 (*) 70 - 99 mg/dL   BUN 22  6 - 23 mg/dL   Creatinine, Ser 1.61 (*) 0.50 - 1.35 mg/dL   Calcium 9.0  8.4 - 09.6 mg/dL   GFR calc non Af Amer 55 (*) >90 mL/min   GFR calc Af Amer 64 (*) >90 mL/min   Comment:            The eGFR has been calculated     using the CKD EPI equation.     This calculation has not been     validated in all clinical     situations.     eGFR's persistently     <90 mL/min signify     possible Chronic Kidney Disease.  PRO B NATRIURETIC PEPTIDE     Status: Abnormal   Collection Time    03/29/13 12:31 AM      Result Value Range   Pro B Natriuretic peptide (BNP) 5779.0 (*) 0 - 125  pg/mL  POCT I-STAT TROPONIN I     Status: None   Collection Time    03/29/13 12:55 AM      Result Value Range   Troponin i, poc 0.02  0.00 - 0.08 ng/mL   Comment 3            Comment: Due to the release kinetics of cTnI,     a negative result within the first hours     of the onset of symptoms does not rule out     myocardial infarction with certainty.     If myocardial infarction is still suspected,     repeat the test at appropriate intervals.  PROTIME-INR     Status: Abnormal   Collection Time    03/29/13  3:00 AM      Result Value Range   Prothrombin Time 18.1 (*) 11.6 - 15.2 seconds   INR 1.55 (*) 0.00 - 1.49  DIGOXIN LEVEL     Status: Abnormal   Collection Time    03/29/13  3:00 AM      Result Value Range   Digoxin Level 0.3 (*) 0.8 - 2.0 ng/mL   Dg Chest 2 View  03/29/2013  *RADIOLOGY REPORT*  Clinical Data: Shortness of breath  CHEST - 2 VIEW  Comparison: 06/07/2012  Findings: Cardiomegaly.  Central vascular congestion.  Mild perihilar increased interstitial markings.  No confluent airspace opacity.  No pleural effusion or pneumothorax.  Left chest wall battery pack with unchanged lead tip positions.  No acute osseous finding.  IMPRESSION: Cardiomegaly with central vascular congestion.  Increased interstitial markings are similar to prior.  No overt edema or focal consolidation.   Original Report Authenticated By: Jearld Lesch, M.D.     Review of Systems  Constitutional: Negative for fever and chills.  Eyes: Negative for blurred vision and double vision.  Respiratory: Positive for shortness of breath. Negative for cough, hemoptysis and sputum production.   Cardiovascular: Negative for chest pain, palpitations and orthopnea.  Gastrointestinal: Negative for nausea, vomiting and abdominal pain.  Neurological: Negative for dizziness and headaches.    Blood pressure 121/86, pulse 75, temperature 97.5 F (36.4 C),  temperature source Oral, resp. rate 18, height 5\' 7"  (1.702  m), weight 87.998 kg (194 lb), SpO2 97.00%. Physical Exam  Constitutional: He is oriented to person, place, and time.  HENT:  Head: Normocephalic and atraumatic.  Eyes: Conjunctivae are normal.  Neck: Normal range of motion. Neck supple. JVD present. No tracheal deviation present. No thyromegaly present.  Cardiovascular: Normal rate and regular rhythm.  Exam reveals no friction rub.   Murmur (Soft systolic murmur and S3 gallop noted) heard. Respiratory:  Bibasilar Rales noted  GI: Soft. Bowel sounds are normal. He exhibits no distension. There is no tenderness. There is no rebound.  Musculoskeletal:  No clubbing cyanosis trace edema noted  Neurological: He is alert and oriented to person, place, and time.     Assessment/Plan Acute decompensated systolic heart failure secondary to noncompliance to diet Severe nonischemic dilated cardiac myopathy  History of cardiogenic non-Q-wave myocardial infarction in the past Hypertension History of nonsustained VT History of paroxysmal A. fib History of alcohol abuse History of polysubstance abuse History of tobacco abuse Acute kidney  injury probably secondary to cardiorenal syndrome Plan As per orders  [facility] N 03/29/2013, 8:35 AM

## 2013-03-30 LAB — BASIC METABOLIC PANEL
GFR calc non Af Amer: 72 mL/min — ABNORMAL LOW (ref >90)
Glucose, Bld: 130 mg/dL — ABNORMAL HIGH (ref 70–99)
Potassium: 3.8 mEq/L (ref 3.5–5.1)
Sodium: 139 mEq/L (ref 135–145)

## 2013-03-30 LAB — PROTIME-INR
INR: 1.76 — ABNORMAL HIGH (ref 0.00–1.49)
Prothrombin Time: 19.9 seconds — ABNORMAL HIGH (ref 11.6–15.2)

## 2013-03-30 LAB — HEPARIN LEVEL (UNFRACTIONATED)
Heparin Unfractionated: 0.18 IU/mL — ABNORMAL LOW (ref 0.30–0.70)
Heparin Unfractionated: 0.24 IU/mL — ABNORMAL LOW (ref 0.30–0.70)
Heparin Unfractionated: 0.62 IU/mL (ref 0.30–0.70)

## 2013-03-30 LAB — CBC
MCV: 91.5 fL (ref 78.0–100.0)
Platelets: 222 10*3/uL (ref 150–400)
RBC: 4.68 MIL/uL (ref 4.22–5.81)
WBC: 6.1 10*3/uL (ref 4.0–10.5)

## 2013-03-30 MED ORDER — WARFARIN SODIUM 10 MG PO TABS
10.0000 mg | ORAL_TABLET | Freq: Once | ORAL | Status: AC
  Administered 2013-03-30: 10 mg via ORAL
  Filled 2013-03-30: qty 1

## 2013-03-30 NOTE — Progress Notes (Signed)
ANTICOAGULATION CONSULT NOTE - Pharmacy Consult for Heparin  Indication: afib  No Known Allergies  Patient Measurements: Height: 5\' 7"  (170.2 cm) Weight: 196 lb 1.6 oz (88.95 kg) IBW/kg (Calculated) : 66.1 Heparin Dosing Weight: 88 kg  Vital Signs: Temp: 98.2 F (36.8 C) (05/05 2049) Temp src: Oral (05/05 2049) BP: 127/84 mmHg (05/05 2049) Pulse Rate: 72 (05/05 2049)  Labs:  Recent Labs  03/29/13 0031 03/29/13 0300 03/29/13 0930 03/29/13 1530  03/29/13 2042 03/30/13 0559 03/30/13 1359 03/30/13 2252  HGB 15.4  --  14.5  --   --   --  14.8  --   --   HCT 43.6  --  41.4  --   --   --  42.8  --   --   PLT 214  --  188  --   --   --  222  --   --   APTT  --   --  29  --   --   --   --   --   --   LABPROT  --  18.1* 18.2*  --   --   --  19.9*  --   --   INR  --  1.55* 1.56*  --   --   --  1.76*  --   --   HEPARINUNFRC  --   --   --   --   < >  --  0.18* 0.24* 0.62  CREATININE 1.48*  --  1.14  --   --   --  1.19  --   --   TROPONINI  --   --  <0.30 <0.30  --  <0.30  --   --   --   < > = values in this interval not displayed.  Estimated Creatinine Clearance: 82.6 ml/min (by C-G formula based on Cr of 1.19).  Assessment: 47 yo male admitted with Afib for heparin Goal of Therapy:  Heparin level 0.3-0.7 Monitor platelets by anticoagulation protocol: Yes   Plan:   Continue Heparin at current rate  Follow-up am labs.  Geannie Risen, PharmD, BCPS  03/30/2013 11:50 PM

## 2013-03-30 NOTE — Progress Notes (Signed)
Pt had beats of bigeminy,  Asymptomatic.  BP 117/65, P80.  Dr Sharyn Lull informed at [phone].  No new order.  Will continue to monitor.  Parker Johnson, Charity fundraiser.

## 2013-03-30 NOTE — Progress Notes (Signed)
ANTICOAGULATION CONSULT NOTE -   Pharmacy Consult for Heparin and Coumadin,  Indication: h/o afib  No Known Allergies  Patient Measurements: Height: 5\' 7"  (170.2 cm) Weight: 196 lb 1.6 oz (88.95 kg) IBW/kg (Calculated) : 66.1 Heparin Dosing Weight: 88 kg  Vital Signs: Temp: 97.8 F (36.6 C) (05/05 0555) Temp src: Oral (05/05 0555) BP: 108/74 mmHg (05/05 0555) Pulse Rate: 63 (05/05 0555)  Labs:  Recent Labs  03/29/13 0031 03/29/13 0300 03/29/13 0930 03/29/13 1530 03/29/13 1715 03/29/13 2042 03/30/13 0559  HGB 15.4  --  14.5  --   --   --  14.8  HCT 43.6  --  41.4  --   --   --  42.8  PLT 214  --  188  --   --   --  222  APTT  --   --  29  --   --   --   --   LABPROT  --  18.1* 18.2*  --   --   --  19.9*  INR  --  1.55* 1.56*  --   --   --  1.76*  HEPARINUNFRC  --   --   --   --  0.37  --  0.18*  CREATININE 1.48*  --  1.14  --   --   --   --   TROPONINI  --   --  <0.30 <0.30  --  <0.30  --     Estimated Creatinine Clearance: 86.2 ml/min (by C-G formula based on Cr of 1.14).   Assessment: 47 yo male admitted with CHF, h/o afib, for anticoagulation  Goal of Therapy:  INR 2-3 Heparin level 0.3-0.7 units/ml Monitor platelets by anticoagulation protocol: Yes   Plan:   Increase Heparin 1500 units/hr Check heparin level in 8 hours. Coumadin 10 mg today  Geannie Risen, PharmD, BCPS  03/30/2013 6:48 AM

## 2013-03-30 NOTE — Plan of Care (Signed)
Problem: Consults Goal: Tobacco Cessation referral if indicated Outcome: Progressing Pt oriented about smoking cessation and handout given.

## 2013-03-30 NOTE — Progress Notes (Signed)
Subjective:  Patient denies any chest pain states breathing is improved. Tolerating milrinone. Renal function improved Objective:  Vital Signs in the last 24 hours: Temp:  [97.5 F (36.4 C)-97.8 F (36.6 C)] 97.8 F (36.6 C) (05/05 0555) Pulse Rate:  [63-80] 71 (05/05 0950) Resp:  [20] 20 (05/05 0555) BP: (99-109)/(54-78) 99/54 mmHg (05/05 0950) SpO2:  [95 %-97 %] 97 % (05/05 0555) Weight:  [88.95 kg (196 lb 1.6 oz)] 88.95 kg (196 lb 1.6 oz) (05/05 0555)  Intake/Output from previous day: 05/04 0701 - 05/05 0700 In: 720 [P.O.:720] Out: 2975 [Urine:2975] Intake/Output from this shift: Total I/O In: 360 [P.O.:360] Out: -   Physical Exam: Neck: no adenopathy, no carotid bruit, no JVD and supple, symmetrical, trachea midline Lungs: Decreased breath sound at bases Heart: regular rate and rhythm, S1, S2 normal and Soft systolic murmur and S3 gallop noted Abdomen: soft, non-tender; bowel sounds normal; no masses,  no organomegaly Extremities: extremities normal, atraumatic, no cyanosis or edema  Lab Results:  Recent Labs  03/29/13 0930 03/30/13 0559  WBC 5.9 6.1  HGB 14.5 14.8  PLT 188 222    Recent Labs  03/29/13 0930 03/30/13 0559  NA 139 139  K 3.6 3.8  CL 101 100  CO2 30 29  GLUCOSE 169* 130*  BUN 21 20  CREATININE 1.14 1.19    Recent Labs  03/29/13 1530 03/29/13 2042  TROPONINI <0.30 <0.30   Hepatic Function Panel  Recent Labs  03/29/13 0930  PROT 6.8  ALBUMIN 3.0*  AST 41*  ALT 49  ALKPHOS 101  BILITOT 1.2   No results found for this basename: CHOL,  in the last 72 hours No results found for this basename: PROTIME,  in the last 72 hours  Imaging: Imaging results have been reviewed and Dg Chest 2 View  03/29/2013  *RADIOLOGY REPORT*  Clinical Data: Shortness of breath  CHEST - 2 VIEW  Comparison: 06/07/2012  Findings: Cardiomegaly.  Central vascular congestion.  Mild perihilar increased interstitial markings.  No confluent airspace opacity.  No  pleural effusion or pneumothorax.  Left chest wall battery pack with unchanged lead tip positions.  No acute osseous finding.  IMPRESSION: Cardiomegaly with central vascular congestion.  Increased interstitial markings are similar to prior.  No overt edema or focal consolidation.   Original Report Authenticated By: Jearld Lesch, M.D.     Cardiac Studies:  Assessment/Plan:  Acute decompensated systolic heart failure secondary to noncompliance to diet  Severe nonischemic dilated cardiac myopathy  History of cardiogenic non-Q-wave myocardial infarction in the past  Hypertension  History of nonsustained VT  History of paroxysmal A. fib  History of alcohol abuse  History of polysubstance abuse  History of tobacco abuse  Acute kidney injury probably secondary to cardiorenal syndrome improved Plan Continue present management Check labs in a.m.  LOS: 1 day    Parker Johnson N 03/30/2013, 11:58 AM

## 2013-03-30 NOTE — Progress Notes (Signed)
ANTICOAGULATION CONSULT NOTE -  Follow-up  Pharmacy Consult for Heparin  Indication: afib  No Known Allergies  Patient Measurements: Height: 5\' 7"  (170.2 cm) Weight: 196 lb 1.6 oz (88.95 kg) IBW/kg (Calculated) : 66.1 Heparin Dosing Weight: 88 kg  Vital Signs: Temp: 97.4 F (36.3 C) (05/05 1427) Temp src: Oral (05/05 1427) BP: 117/65 mmHg (05/05 1427) Pulse Rate: 80 (05/05 1427)  Labs:  Recent Labs  03/29/13 0031 03/29/13 0300 03/29/13 0930 03/29/13 1530 03/29/13 1715 03/29/13 2042 03/30/13 0559 03/30/13 1359  HGB 15.4  --  14.5  --   --   --  14.8  --   HCT 43.6  --  41.4  --   --   --  42.8  --   PLT 214  --  188  --   --   --  222  --   APTT  --   --  29  --   --   --   --   --   LABPROT  --  18.1* 18.2*  --   --   --  19.9*  --   INR  --  1.55* 1.56*  --   --   --  1.76*  --   HEPARINUNFRC  --   --   --   --  0.37  --  0.18* 0.24*  CREATININE 1.48*  --  1.14  --   --   --  1.19  --   TROPONINI  --   --  <0.30 <0.30  --  <0.30  --   --     Estimated Creatinine Clearance: 82.6 ml/min (by C-G formula based on Cr of 1.19).  Assessment: 47 yo male admitted with SOB shortness and acute decompensated systolic heart failure. Pt continues on IV heparin until INR is therapeutic on coumadin. Heparin level remains low at 0.24. No bleeding noted, CBC is stable.   Goal of Therapy:  Heparin level 0.3-0.7 Monitor platelets by anticoagulation protocol: Yes   Plan:   1. Increase heparin gtt to 1700 units/hr 2. Check an 8 hour heparin level 3. Give coumadin tonight as ordered 4. Continue daily INR, heparin level and CBC  Lysle Pearl, PharmD, BCPS Pager # [phone] 03/30/2013 3:01 PM

## 2013-03-31 LAB — PROTIME-INR
INR: 2.33 — ABNORMAL HIGH (ref 0.00–1.49)
Prothrombin Time: 24.5 seconds — ABNORMAL HIGH (ref 11.6–15.2)

## 2013-03-31 LAB — BASIC METABOLIC PANEL
BUN: 15 mg/dL (ref 6–23)
Calcium: 8.9 mg/dL (ref 8.4–10.5)
Chloride: 101 mEq/L (ref 96–112)
Creatinine, Ser: 1.24 mg/dL (ref 0.50–1.35)
GFR calc Af Amer: 79 mL/min — ABNORMAL LOW (ref >90)
GFR calc non Af Amer: 68 mL/min — ABNORMAL LOW (ref >90)

## 2013-03-31 LAB — HEPARIN LEVEL (UNFRACTIONATED): Heparin Unfractionated: 0.58 IU/mL (ref 0.30–0.70)

## 2013-03-31 LAB — CBC
MCH: 31.9 pg (ref 26.0–34.0)
MCHC: 35.8 g/dL (ref 30.0–36.0)
Platelets: 212 10*3/uL (ref 150–400)
RDW: 15.6 % — ABNORMAL HIGH (ref 11.5–15.5)

## 2013-03-31 MED ORDER — MILRINONE IN DEXTROSE 20 MG/100ML IV SOLN
0.1250 ug/kg/min | INTRAVENOUS | Status: DC
  Administered 2013-03-31: 0.125 ug/kg/min via INTRAVENOUS
  Filled 2013-03-31: qty 100

## 2013-03-31 MED ORDER — ALLOPURINOL 100 MG PO TABS
100.0000 mg | ORAL_TABLET | Freq: Every day | ORAL | Status: DC
  Administered 2013-03-31 – 2013-04-01 (×2): 100 mg via ORAL
  Filled 2013-03-31 (×2): qty 1

## 2013-03-31 MED ORDER — COLCHICINE 0.6 MG PO TABS
0.6000 mg | ORAL_TABLET | Freq: Every day | ORAL | Status: DC
  Administered 2013-03-31 – 2013-04-01 (×2): 0.6 mg via ORAL
  Filled 2013-03-31 (×2): qty 1

## 2013-03-31 MED ORDER — WARFARIN SODIUM 10 MG PO TABS
10.0000 mg | ORAL_TABLET | Freq: Once | ORAL | Status: AC
  Administered 2013-03-31: 10 mg via ORAL
  Filled 2013-03-31: qty 1

## 2013-03-31 NOTE — Progress Notes (Signed)
Subjective:  Patient denies any chest pain states breathing has improved. Denies any palpitations. Complains of ankle pain. Had multiple episodes of nonsustained VT on the monitor asymptomatic while on milrinone which is being weaned off  Objective:  Vital Signs in the last 24 hours: Temp:  [97.4 F (36.3 C)-98.2 F (36.8 C)] 97.4 F (36.3 C) (05/06 0519) Pulse Rate:  [71-83] 83 (05/06 0519) Resp:  [20] 20 (05/06 0519) BP: (99-127)/(54-84) 119/72 mmHg (05/06 0519) SpO2:  [98 %-99 %] 98 % (05/06 0519) Weight:  [88.089 kg (194 lb 3.2 oz)] 88.089 kg (194 lb 3.2 oz) (05/06 0519)  Intake/Output from previous day: 05/05 0701 - 05/06 0700 In: 2333.3 [P.O.:1320; I.V.:1013.3] Out: 4075 [Urine:4075] Intake/Output from this shift: Total I/O In: 240 [P.O.:240] Out: -   Physical Exam: Neck: no adenopathy, no carotid bruit, no JVD and supple, symmetrical, trachea midline Lungs: Decreased breath sound at bases air entry improved Heart: regular rate and rhythm, S1, S2 normal and Soft systolic murmur and S3 gallop noted Abdomen: soft, non-tender; bowel sounds normal; no masses,  no organomegaly Extremities: extremities normal, atraumatic, no cyanosis or edema  Lab Results:  Recent Labs  03/30/13 0559 03/31/13 0505  WBC 6.1 7.2  HGB 14.8 14.6  PLT 222 212    Recent Labs  03/30/13 0559 03/31/13 0505  NA 139 141  K 3.8 3.7  CL 100 101  CO2 29 30  GLUCOSE 130* 114*  BUN 20 15  CREATININE 1.19 1.24    Recent Labs  03/29/13 1530 03/29/13 2042  TROPONINI <0.30 <0.30   Hepatic Function Panel  Recent Labs  03/29/13 0930  PROT 6.8  ALBUMIN 3.0*  AST 41*  ALT 49  ALKPHOS 101  BILITOT 1.2   No results found for this basename: CHOL,  in the last 72 hours No results found for this basename: PROTIME,  in the last 72 hours  Imaging: Imaging results have been reviewed and No results found.  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 fib  History of alcohol abuse  History of polysubstance abuse  History of tobacco abuse  Acute kidney injury probably secondary to cardiorenal syndrome improved  Probable gouty arthritis Plan Wean off milrinone DC heparin Check labs in a.m. Restart colchicine and allopurinol  LOS: 2 days    Parker Johnson 03/31/2013, 8:59 AM

## 2013-03-31 NOTE — Progress Notes (Signed)
ANTICOAGULATION CONSULT NOTE -  Follow-up  Pharmacy Consult for Heparin + coumadin   Indication: afib  No Known Allergies  Patient Measurements: Height: 5\' 7"  (170.2 cm) Weight: 194 lb 3.2 oz (88.089 kg) IBW/kg (Calculated) : 66.1 Heparin Dosing Weight: 88 kg  Vital Signs: Temp: 97.4 F (36.3 C) (05/06 0519) Temp src: Oral (05/06 0519) BP: 119/72 mmHg (05/06 0519) Pulse Rate: 83 (05/06 0519)  Labs:  Recent Labs  03/29/13 0930 03/29/13 1530  03/29/13 2042 03/30/13 0559 03/30/13 1359 03/30/13 2252 03/31/13 0505  HGB 14.5  --   --   --  14.8  --   --  14.6  HCT 41.4  --   --   --  42.8  --   --  40.8  PLT 188  --   --   --  222  --   --  212  APTT 29  --   --   --   --   --   --   --   LABPROT 18.2*  --   --   --  19.9*  --   --  24.5*  INR 1.56*  --   --   --  1.76*  --   --  2.33*  HEPARINUNFRC  --   --   < >  --  0.18* 0.24* 0.62 0.58  CREATININE 1.14  --   --   --  1.19  --   --  1.24  TROPONINI <0.30 <0.30  --  <0.30  --   --   --   --   < > = values in this interval not displayed.  Estimated Creatinine Clearance: 78.9 ml/min (by C-G formula based on Cr of 1.24).  Assessment: 47 yo male admitted with SOB shortness and acute decompensated systolic heart failure. Pt continues on IV heparin and coumadin. Heparin level is remains therapeutic at 0.58. INR is also therapeutic at 2.33. No bleeding noted, CBC is stable.   Goal of Therapy:  Heparin level 0.3-0.7 INR 2-3 Monitor platelets by anticoagulation protocol: Yes   Plan:   1. Continue heparin gtt at 1700 units/hr - Consider DC since INR is therapeutic 2. Coumadin 10mg  PO x 1 tonight 3. F/u AM INR, heparin level and CBC  Lysle Pearl, PharmD, BCPS Pager # [phone] 03/31/2013 8:24 AM

## 2013-04-01 LAB — BASIC METABOLIC PANEL
BUN: 15 mg/dL (ref 6–23)
Creatinine, Ser: 1.01 mg/dL (ref 0.50–1.35)
GFR calc Af Amer: >90 mL/min (ref >90)
GFR calc non Af Amer: 87 mL/min — ABNORMAL LOW (ref >90)
Glucose, Bld: 108 mg/dL — ABNORMAL HIGH (ref 70–99)

## 2013-04-01 LAB — PROTIME-INR
INR: 2.15 — ABNORMAL HIGH (ref 0.00–1.49)
Prothrombin Time: 23.1 seconds — ABNORMAL HIGH (ref 11.6–15.2)

## 2013-04-01 LAB — CBC
MCH: 32 pg (ref 26.0–34.0)
MCHC: 35.8 g/dL (ref 30.0–36.0)
RDW: 15.9 % — ABNORMAL HIGH (ref 11.5–15.5)

## 2013-04-01 LAB — URIC ACID: Uric Acid, Serum: 9.9 mg/dL — ABNORMAL HIGH (ref 4.0–7.8)

## 2013-04-01 MED ORDER — ALLOPURINOL 100 MG PO TABS: 100.0000 mg | ORAL_TABLET | Freq: Every day | ORAL | Status: DC

## 2013-04-01 MED ORDER — FUROSEMIDE 40 MG PO TABS: 40.0000 mg | ORAL_TABLET | Freq: Two times a day (BID) | ORAL | Status: DC

## 2013-04-01 MED ORDER — WARFARIN SODIUM 7.5 MG PO TABS
15.0000 mg | ORAL_TABLET | Freq: Once | ORAL | Status: DC
  Filled 2013-04-01: qty 2

## 2013-04-01 NOTE — Discharge Summary (Signed)
  Discharge summary dictated on 04/01/2013 dictation number is [phone]

## 2013-04-01 NOTE — Progress Notes (Signed)
Patient had 8 beats VTACH. Patient asymtpomatic. RN will continue to monitor. Louretta Parma, RN

## 2013-04-01 NOTE — Progress Notes (Signed)
04/01/13 1145 In to completed Heart Failure Home Health Screen.  Pt. states he obtains his medications from [place] at [facility].  Pt. states he weighs himself most everyday.  Encouraged pt. to weigh everyday.  Pt. did not want to have home health services at this time.  Pt. to dc home today. Tera Mater, RN, BSN NCM [phone]

## 2013-04-01 NOTE — Progress Notes (Signed)
ANTICOAGULATION CONSULT NOTE -  Follow-up  Pharmacy Consult for coumadin   Indication: afib  No Known Allergies  Patient Measurements: Height: 5\' 7"  (170.2 cm) Weight: 193 lb 8 oz (87.771 kg) (SCALE b) IBW/kg (Calculated) : 66.1  Vital Signs: Temp: 97.9 F (36.6 C) (05/07 0523) Temp src: Oral (05/07 0523) BP: 105/73 mmHg (05/07 0523) Pulse Rate: 73 (05/07 0523)  Labs:  Recent Labs  03/29/13 0930 03/29/13 1530  03/29/13 2042 03/30/13 0559 03/30/13 1359 03/30/13 2252 03/31/13 0505 04/01/13 0515  HGB 14.5  --   --   --  14.8  --   --  14.6 15.5  HCT 41.4  --   --   --  42.8  --   --  40.8 43.3  PLT 188  --   --   --  222  --   --  212 218  APTT 29  --   --   --   --   --   --   --   --   LABPROT 18.2*  --   --   --  19.9*  --   --  24.5* 23.1*  INR 1.56*  --   --   --  1.76*  --   --  2.33* 2.15*  HEPARINUNFRC  --   --   < >  --  0.18* 0.24* 0.62 0.58  --   CREATININE 1.14  --   --   --  1.19  --   --  1.24 1.01  TROPONINI <0.30 <0.30  --  <0.30  --   --   --   --   --   < > = values in this interval not displayed.  Estimated Creatinine Clearance: 96.7 ml/min (by C-G formula based on Cr of 1.01).  Assessment: 47 yo male admitted with SOB shortness and acute decompensated systolic heart failure. Pt continues coumadin for afib. INR remains therapeutic at 2.15 but did drop slightly. No bleeding noted, CBC is stable.   Goal of Therapy:  INR 2-3   Plan:   1. Coumadin 15mg  PO x 1 tonight - may need a new dose of alternating 10mg  and 15mg  at discharge since pt admitted subtherapeutic on 10mg  daily 2. F/u AM INR  Lysle Pearl, PharmD, BCPS Pager # [phone] 04/01/2013 8:59 AM

## 2013-04-02 NOTE — Discharge Summary (Signed)
NAME:  Parker Johnson, FERG NO.:  [id number]  MEDICAL RECORD NO.:  [id number]  LOCATION:  4736                         FACILITY:  [facility]  PHYSICIAN:  Eduardo Osier. Sharyn Lull, M.D. DATE OF BIRTH:  02-07-1966  DATE OF ADMISSION:  03/29/2013 DATE OF DISCHARGE:  04/01/2013                              DISCHARGE SUMMARY   ADMITTING DIAGNOSES: 1. Acute decompensated systolic heart failure secondary to     noncompliance to diet. 2. Severe nonischemic dilated cardiomyopathy. 3. Status post implantable cardioverter-defibrillator. 4. History of cardiogenic non-Q-wave myocardial infarction in the     past. 5. Hypertension. 6. History of nonsustained ventricular tachycardia. 7. History of paroxysmal atrial fibrillation. 8. History of polysubstance abuse in the past. 9. History of tobacco abuse in the past. 10.Acute kidney injury secondary to cardiorenal syndrome.  FINAL DIAGNOSES: 1. Compensated systolic heart failure. 2. Severe nonischemic dilated cardiomyopathy status post implantable     cardioverter-defibrillator. 3. History of cardiogenic non-Q-wave myocardial infarction in the     past. 4. Hypertension. 5. History of nonsustained ventricular tachycardia, asymptomatic. 6. History of paroxysmal atrial fibrillation in the past. 7. History of tobacco abuse. 8. History of alcohol abuse. 9. History of polysubstance abuse in the past. 10.Status post acute kidney injury, improved. 11.Gouty arthritis.  DISCHARGE HOME MEDICATIONS: 1. Allopurinol 100 mg 1 tablet daily. 2. Lasix 40 mg 1 tablet twice daily as before. 3. Carvedilol 6.25 mg twice daily. 4. Digoxin 0.25 mg 1 tablet daily. 5. Lisinopril 20 mg 1 tablet daily. 6. Aldactone 25 mg 1 tablet daily. 7. Warfarin 10 mg 1 tablet daily.  DIET:  Low salt, low cholesterol.  ACTIVITY:  Increase activity slowly as tolerated.  The patient has been advised to restrict fluid to 1 L per 24 hours and monitor weight daily. Heart  failure instructions have been given.  Follow up with me in 1 week.  CONDITION ON DISCHARGE:  Stable.  BRIEF HISTORY AND HOSPITAL COURSE:  Mr. Parker Johnson is a 47 year old male with past medical history significant for multiple medical problems i.e., severe nonischemic dilated cardiomyopathy, history of cardiogenic non-Q- wave myocardial infarction in the past, hypertension, history of nonsustained ventricular tachycardia in the past, history of paroxysmal AFib in the past, history of alcohol abuse, tobacco abuse, history of polysubstance abuse in the past, history of recurrent congestive heart failure secondary to depressed LV systolic function, EF of approximately 15%.  He came to the ER, complaining of progressive increasing shortness of breath for the last 2 to 3 days.  He states lately, he has been eating bacon and sausage and had gained a few pounds.  States has gained approximately 27 pounds in the last 1 year.  Denies any chest pain, nausea, vomiting.  Denies palpitation, lightheadedness, or syncope.  The patient does give history of PND, orthopnea, and minimal leg swelling. Denies any cough, fever, chills.  Denies any noncompliance to medication.  PAST MEDICAL HISTORY:  As above.  PAST SURGICAL HISTORY:  He had ICD placed in 2010.  PHYSICAL EXAMINATION:  GENERAL:  He was alert, awake, oriented. VITAL SIGNS:  Blood pressure was 126/86, pulse was 75.  He was afebrile. HEENT:  Conjunctivae pink. NECK:  Supple.  Positive JVD.  No bruit. LUNGS:  He has decreased breath sounds at bases with bibasilar rales. CARDIOVASCULAR:  S1, S2 was normal.  There was soft systolic murmur and S3 gallop. ABDOMEN:  Soft, obese, bowel sounds were present.  Nontender. EXTREMITIES:  There is no clubbing, cyanosis.  There was trace edema.  LABORATORY DATA:  Sodium was 139, potassium 4.4, BUN 22, creatinine 1.48.  His BNP was 5779, repeat BNP 4154, repeat BNP yesterday was 2400, today BNP is 2669.   Sodium is 137, potassium 4.1, BUN is 15.  Creatinine is back to baseline, 1.01.  Three sets of troponin I were negative. Hemoglobin today is 15.5, hematocrit 43.3, white count of 5.1.  His admission PT/INR was 18.1, INR of 1.55, today PT is 23.1, INR 2.15.  BRIEF HOSPITAL COURSE:  The patient was admitted to telemetry unit.  MI was ruled out by serial enzymes and EKG.  The patient was started on IV milrinone and IV Lasix with good diuresis.  The patient had multiple episodes of nonsustained few beats of ventricular tachycardia while on milrinone which was slowly weaned off.  The patient is off milrinone for the last 24 hours.  The patient did not receive any ICD shocks during the hospital stay.  The patient has been ambulating in room without any problems.  His leg swelling has completely resolved and his breathing has improved.  The patient has been counseled extensively regarding diet, lifestyle modification, and monitoring his weight and fluid restriction, to which he agrees.  The patient will be discharged home and will be followed up in my office in 1 week and by EP as scheduled.     Eduardo Osier. Sharyn Lull, M.D.    [facility]  D:  04/01/2013  T:  04/02/2013  Job:  [id number]

## 2013-04-14 ENCOUNTER — Ambulatory Visit (INDEPENDENT_AMBULATORY_CARE_PROVIDER_SITE_OTHER): Payer: Medicaid Other | Admitting: Internal Medicine

## 2013-04-14 ENCOUNTER — Encounter: Payer: Self-pay | Admitting: Internal Medicine

## 2013-04-14 VITALS — BP 122/78 | HR 70 | Ht 67.0 in | Wt 189.6 lb

## 2013-04-14 DIAGNOSIS — I429 Cardiomyopathy, unspecified: Secondary | ICD-10-CM

## 2013-04-14 DIAGNOSIS — I472 Ventricular tachycardia: Secondary | ICD-10-CM

## 2013-04-14 DIAGNOSIS — Z95810 Presence of automatic (implantable) cardiac defibrillator: Secondary | ICD-10-CM

## 2013-04-14 DIAGNOSIS — I4891 Unspecified atrial fibrillation: Secondary | ICD-10-CM

## 2013-04-14 LAB — ICD DEVICE OBSERVATION
AL THRESHOLD: 0.8 V
ATRIAL PACING ICD: 1 pct
CHARGE TIME: 8.9 s
DEV-0020ICD: NEGATIVE
RV LEAD AMPLITUDE: 25 mv
TZAT-0001FASTVT: 1
TZAT-0001FASTVT: 2
TZAT-0001SLOWVT: 1
TZAT-0001SLOWVT: 2
TZAT-0004FASTVT: 10
TZAT-0004SLOWVT: 4
TZAT-0013SLOWVT: 4
TZAT-0018SLOWVT: NEGATIVE
TZAT-0018SLOWVT: NEGATIVE
TZAT-0019SLOWVT: 5 V
TZAT-0019SLOWVT: 5 V
TZAT-0020FASTVT: 1 ms
TZAT-0020SLOWVT: 1 ms
TZAT-0020SLOWVT: 1 ms
TZON-0003SLOWVT: 353 ms
TZON-0004FASTVT: 10
TZON-0004SLOWVT: 20
TZON-0005FASTVT: 1
TZON-0005SLOWVT: 1
TZST-0001FASTVT: 3
TZST-0001FASTVT: 5
TZST-0001FASTVT: 6
TZST-0001SLOWVT: 3
TZST-0001SLOWVT: 5
TZST-0001SLOWVT: 7
TZST-0003FASTVT: 41 J
TZST-0003FASTVT: 41 J
TZST-0003SLOWVT: 41 J
TZST-0003SLOWVT: 41 J
VENTRICULAR PACING ICD: 1 pct

## 2013-04-14 NOTE — Assessment & Plan Note (Signed)
Stable contnue current meds \

## 2013-04-14 NOTE — Patient Instructions (Signed)
Remote monitoring is used to monitor your Pacemaker of ICD from home. This monitoring reduces the number of office visits required to check your device to one time per year. It allows [place] to keep an eye on the functioning of your device to ensure it is working properly. You are scheduled for a device check from home on 07/16/13. You may send your transmission at any time that day. If you have a wireless device, the transmission will be sent automatically. After your physician reviews your transmission, you will receive a postcard with your next transmission date.  Your physician wants you to follow-up in: 1 year with Dr. Graciela Husbands. You will receive a reminder letter in the mail two months in advance. If you don't receive a letter, please call our office to schedule the follow-up appointment.  Your physician recommends that you continue on your current medications as directed. Please refer to the Current Medication list given to you today.

## 2013-04-14 NOTE — Assessment & Plan Note (Signed)
The patient's device was interrogated.  The information was reviewed. No changes were made in the programming.    

## 2013-04-14 NOTE — Assessment & Plan Note (Signed)
No intercurrent Ventricular tachycardia  

## 2013-04-14 NOTE — Progress Notes (Signed)
Patient Care Team: Robynn Pane, MD as PCP - General (Internal Medicine)   HPI  Parker Johnson is a 47 y.o. male Is seen in followup for ICD implantation for congestive heart failure and nonischemic cardiomyopathy. He is part of the [employer]AD[employer]. IT trial  He has a narrow QRS also received a dual-chamber device without left ventricular resynchronization  He has done relatively well. He has no complaints of chest pain or shortness of breath and he is doing some work in the yard   Past Medical History  Diagnosis Date  . Ventricular tachycardia   . AICD (automatic cardioverter/defibrillator) present     [employer]  . Tobacco abuse   . Alcohol abuse     hx  . Cardiomyopathy     secondary  . HTN (hypertension)     uncontrolled  . CHF (congestive heart failure)   . Gout     Past Surgical History  Procedure Laterality Date  . Cardiac defibrillator placement  2010    [employer] 100    Current Outpatient Prescriptions  Medication Sig Dispense Refill  . allopurinol (ZYLOPRIM) 100 MG tablet Take 1 tablet (100 mg total) by mouth daily.  30 tablet  3  . carvedilol (COREG) 6.25 MG tablet Take 6.25 mg by mouth 2 (two) times daily with a meal.      . digoxin (LANOXIN) 0.25 MG tablet Take 0.25 mg by mouth daily.      . furosemide (LASIX) 40 MG tablet Take 1 tablet (40 mg total) by mouth 2 (two) times daily.  60 tablet  3  . lisinopril (PRINIVIL,ZESTRIL) 20 MG tablet Take 20 mg by mouth daily.        Marland Kitchen spironolactone (ALDACTONE) 25 MG tablet Take 25 mg by mouth daily.        Marland Kitchen warfarin (COUMADIN) 10 MG tablet Take 10 mg by mouth every evening.        No current facility-administered medications for this visit.    No Known Allergies  Review of Systems negative except from HPI and PMH  Physical Exam BP 122/78  Pulse 70  Ht 5\' 7"  (1.702 m)  Wt 189 lb 9.6 oz (86.002 kg)  BMI 29.69 kg/m2 Well developed and nourished in no acute distress HENT  normal Neck supple with JVP-flat Clear Regular rate and rhythm, no murmurs or gallops Abd-soft with active BS No Clubbing cyanosis edema Skin-warm and dry A & Oriented  Grossly normal sensory and motor function     Assessment and  Plan

## 2013-06-04 ENCOUNTER — Encounter (HOSPITAL_COMMUNITY): Payer: Self-pay | Admitting: Anesthesiology

## 2013-06-04 ENCOUNTER — Encounter (HOSPITAL_COMMUNITY)
Admission: EM | Disposition: A | Discharge status: Home / Self Care | Payer: Self-pay | Source: Home / Self Care | Attending: Surgery

## 2013-06-04 ENCOUNTER — Encounter (HOSPITAL_COMMUNITY): Payer: Self-pay | Admitting: Emergency Medicine

## 2013-06-04 ENCOUNTER — Emergency Department (HOSPITAL_COMMUNITY): Payer: Medicaid Other | Admitting: Anesthesiology

## 2013-06-04 ENCOUNTER — Inpatient Hospital Stay (HOSPITAL_COMMUNITY)
Admission: EM | Admit: 2013-06-04 | Discharge: 2013-06-06 | DRG: 253 | Disposition: A | Discharge status: Home / Self Care | Payer: Medicaid Other | Attending: Surgery | Admitting: Surgery

## 2013-06-04 DIAGNOSIS — I428 Other cardiomyopathies: Secondary | ICD-10-CM | POA: Diagnosis present

## 2013-06-04 DIAGNOSIS — I742 Embolism and thrombosis of arteries of the upper extremities: Secondary | ICD-10-CM

## 2013-06-04 DIAGNOSIS — I10 Essential (primary) hypertension: Secondary | ICD-10-CM | POA: Diagnosis present

## 2013-06-04 DIAGNOSIS — I4729 Other ventricular tachycardia: Secondary | ICD-10-CM | POA: Diagnosis present

## 2013-06-04 DIAGNOSIS — M79609 Pain in unspecified limb: Secondary | ICD-10-CM

## 2013-06-04 DIAGNOSIS — R109 Unspecified abdominal pain: Secondary | ICD-10-CM

## 2013-06-04 DIAGNOSIS — I2589 Other forms of chronic ischemic heart disease: Secondary | ICD-10-CM | POA: Diagnosis present

## 2013-06-04 DIAGNOSIS — I4891 Unspecified atrial fibrillation: Secondary | ICD-10-CM | POA: Diagnosis present

## 2013-06-04 DIAGNOSIS — Z95810 Presence of automatic (implantable) cardiac defibrillator: Secondary | ICD-10-CM

## 2013-06-04 DIAGNOSIS — Z79899 Other long term (current) drug therapy: Secondary | ICD-10-CM

## 2013-06-04 DIAGNOSIS — I472 Ventricular tachycardia, unspecified: Secondary | ICD-10-CM | POA: Diagnosis present

## 2013-06-04 DIAGNOSIS — M62221 Nontraumatic ischemic infarction of muscle, right upper arm: Secondary | ICD-10-CM

## 2013-06-04 DIAGNOSIS — Z7901 Long term (current) use of anticoagulants: Secondary | ICD-10-CM

## 2013-06-04 DIAGNOSIS — I748 Embolism and thrombosis of other arteries: Principal | ICD-10-CM | POA: Diagnosis present

## 2013-06-04 DIAGNOSIS — M109 Gout, unspecified: Secondary | ICD-10-CM | POA: Diagnosis present

## 2013-06-04 DIAGNOSIS — I509 Heart failure, unspecified: Secondary | ICD-10-CM | POA: Diagnosis present

## 2013-06-04 DIAGNOSIS — F17200 Nicotine dependence, unspecified, uncomplicated: Secondary | ICD-10-CM | POA: Diagnosis present

## 2013-06-04 HISTORY — PX: EMBOLECTOMY: SHX44

## 2013-06-04 LAB — CBC WITH DIFFERENTIAL/PLATELET
Basophils Relative: 0 % (ref 0–1)
Eosinophils Absolute: 0.2 10*3/uL (ref 0.0–0.7)
Eosinophils Relative: 4 % (ref 0–5)
Hemoglobin: 15 g/dL (ref 13.0–17.0)
Lymphs Abs: 1.3 10*3/uL (ref 0.7–4.0)
MCH: 31.6 pg (ref 26.0–34.0)
MCHC: 34.5 g/dL (ref 30.0–36.0)
MCV: 91.8 fL (ref 78.0–100.0)
Monocytes Absolute: 0.4 10*3/uL (ref 0.1–1.0)
Monocytes Relative: 8 % (ref 3–12)
Neutrophils Relative %: 65 % (ref 43–77)
RBC: 4.74 MIL/uL (ref 4.22–5.81)

## 2013-06-04 LAB — BASIC METABOLIC PANEL
BUN: 11 mg/dL (ref 6–23)
CO2: 27 mEq/L (ref 19–32)
Calcium: 9.6 mg/dL (ref 8.4–10.5)
GFR calc non Af Amer: 74 mL/min — ABNORMAL LOW (ref >90)
Glucose, Bld: 139 mg/dL — ABNORMAL HIGH (ref 70–99)
Potassium: 3.8 mEq/L (ref 3.5–5.1)
Sodium: 139 mEq/L (ref 135–145)

## 2013-06-04 LAB — MRSA PCR SCREENING: MRSA by PCR: NEGATIVE

## 2013-06-04 SURGERY — EMBOLECTOMY
Anesthesia: General | Site: Arm Upper | Laterality: Right | Wound class: Clean

## 2013-06-04 MED ORDER — OXYCODONE-ACETAMINOPHEN 5-325 MG PO TABS
1.0000 | ORAL_TABLET | ORAL | Status: DC | PRN
  Administered 2013-06-04 – 2013-06-05 (×2): 1 via ORAL
  Administered 2013-06-05 (×2): 2 via ORAL
  Administered 2013-06-05 (×2): 1 via ORAL
  Administered 2013-06-06: 2 via ORAL
  Administered 2013-06-06: 1 via ORAL
  Filled 2013-06-04 (×2): qty 2
  Filled 2013-06-04 (×4): qty 1
  Filled 2013-06-04: qty 2
  Filled 2013-06-04: qty 1

## 2013-06-04 MED ORDER — CARVEDILOL 6.25 MG PO TABS
6.2500 mg | ORAL_TABLET | Freq: Two times a day (BID) | ORAL | Status: DC
  Administered 2013-06-05 – 2013-06-06 (×2): 6.25 mg via ORAL
  Filled 2013-06-04 (×4): qty 1

## 2013-06-04 MED ORDER — EPHEDRINE SULFATE 50 MG/ML IJ SOLN
INTRAMUSCULAR | Status: DC | PRN
  Administered 2013-06-04 (×3): 10 mg via INTRAVENOUS

## 2013-06-04 MED ORDER — LIDOCAINE HCL (CARDIAC) 20 MG/ML IV SOLN
INTRAVENOUS | Status: DC | PRN
  Administered 2013-06-04: 30 mg via INTRAVENOUS

## 2013-06-04 MED ORDER — ROCURONIUM BROMIDE 100 MG/10ML IV SOLN
INTRAVENOUS | Status: DC | PRN
  Administered 2013-06-04: 30 mg via INTRAVENOUS

## 2013-06-04 MED ORDER — POTASSIUM CHLORIDE CRYS ER 20 MEQ PO TBCR: 20.0000 meq | EXTENDED_RELEASE_TABLET | Freq: Every day | ORAL | Status: DC | PRN

## 2013-06-04 MED ORDER — SODIUM CHLORIDE 0.9 % IR SOLN
Status: DC | PRN
  Administered 2013-06-04: 16:00:00

## 2013-06-04 MED ORDER — HYDROMORPHONE HCL PF 1 MG/ML IJ SOLN
INTRAMUSCULAR | Status: AC
  Administered 2013-06-04: 0.5 mg via INTRAVENOUS
  Filled 2013-06-04: qty 1

## 2013-06-04 MED ORDER — MORPHINE SULFATE 2 MG/ML IJ SOLN: 2.0000 mg | INTRAMUSCULAR | Status: DC | PRN

## 2013-06-04 MED ORDER — HYDROMORPHONE HCL PF 1 MG/ML IJ SOLN
0.2500 mg | INTRAMUSCULAR | Status: DC | PRN
  Administered 2013-06-04: 0.5 mg via INTRAVENOUS

## 2013-06-04 MED ORDER — PROPOFOL 10 MG/ML IV BOLUS
INTRAVENOUS | Status: DC | PRN
  Administered 2013-06-04: 70 mg via INTRAVENOUS

## 2013-06-04 MED ORDER — SPIRONOLACTONE 25 MG PO TABS
25.0000 mg | ORAL_TABLET | Freq: Every day | ORAL | Status: DC
  Administered 2013-06-05 – 2013-06-06 (×2): 25 mg via ORAL
  Filled 2013-06-04 (×2): qty 1

## 2013-06-04 MED ORDER — ACETAMINOPHEN 650 MG RE SUPP: 325.0000 mg | RECTAL | Status: DC | PRN

## 2013-06-04 MED ORDER — PHENYLEPHRINE HCL 10 MG/ML IJ SOLN
INTRAMUSCULAR | Status: DC | PRN
  Administered 2013-06-04 (×3): 80 ug via INTRAVENOUS

## 2013-06-04 MED ORDER — ALLOPURINOL 100 MG PO TABS
100.0000 mg | ORAL_TABLET | Freq: Every day | ORAL | Status: DC
  Administered 2013-06-05 – 2013-06-06 (×2): 100 mg via ORAL
  Filled 2013-06-04 (×2): qty 1

## 2013-06-04 MED ORDER — DIGOXIN 250 MCG PO TABS
0.2500 mg | ORAL_TABLET | Freq: Every day | ORAL | Status: DC
Start: 2013-06-05 — End: 2013-06-06
  Administered 2013-06-05 – 2013-06-06 (×2): 0.25 mg via ORAL
  Filled 2013-06-04 (×2): qty 1

## 2013-06-04 MED ORDER — LACTATED RINGERS IV SOLN
INTRAVENOUS | Status: DC | PRN
  Administered 2013-06-04: 16:00:00 via INTRAVENOUS

## 2013-06-04 MED ORDER — METOPROLOL TARTRATE 1 MG/ML IV SOLN: 2.0000 mg | INTRAVENOUS | Status: DC | PRN

## 2013-06-04 MED ORDER — LABETALOL HCL 5 MG/ML IV SOLN: 10.0000 mg | INTRAVENOUS | Status: DC | PRN

## 2013-06-04 MED ORDER — SODIUM CHLORIDE 0.9 % IV SOLN: 500.0000 mL | Freq: Once | INTRAVENOUS | Status: AC | PRN

## 2013-06-04 MED ORDER — DOCUSATE SODIUM 100 MG PO CAPS
100.0000 mg | ORAL_CAPSULE | Freq: Every day | ORAL | Status: DC
  Administered 2013-06-05 – 2013-06-06 (×2): 100 mg via ORAL
  Filled 2013-06-04 (×2): qty 1

## 2013-06-04 MED ORDER — HYDROMORPHONE HCL PF 1 MG/ML IJ SOLN: 1.0000 mg | Freq: Once | INTRAMUSCULAR | Status: DC

## 2013-06-04 MED ORDER — LISINOPRIL 20 MG PO TABS
20.0000 mg | ORAL_TABLET | Freq: Every day | ORAL | Status: DC
  Administered 2013-06-05 – 2013-06-06 (×2): 20 mg via ORAL
  Filled 2013-06-04 (×2): qty 1

## 2013-06-04 MED ORDER — HYDRALAZINE HCL 20 MG/ML IJ SOLN: 10.0000 mg | INTRAMUSCULAR | Status: DC | PRN

## 2013-06-04 MED ORDER — ONDANSETRON HCL 4 MG/2ML IJ SOLN: 4.0000 mg | Freq: Once | INTRAMUSCULAR | Status: DC | PRN

## 2013-06-04 MED ORDER — GUAIFENESIN-DM 100-10 MG/5ML PO SYRP: 15.0000 mL | ORAL_SOLUTION | ORAL | Status: DC | PRN

## 2013-06-04 MED ORDER — DEXTROSE-NACL 5-0.45 % IV SOLN
INTRAVENOUS | Status: DC
  Administered 2013-06-04 – 2013-06-05 (×2): via INTRAVENOUS

## 2013-06-04 MED ORDER — HEPARIN (PORCINE) IN NACL 100-0.45 UNIT/ML-% IJ SOLN
1450.0000 [IU]/h | INTRAMUSCULAR | Status: DC
  Administered 2013-06-04: 1250 [IU]/h via INTRAVENOUS
  Administered 2013-06-05: 1450 [IU]/h via INTRAVENOUS
  Filled 2013-06-04 (×3): qty 250

## 2013-06-04 MED ORDER — 0.9 % SODIUM CHLORIDE (POUR BTL) OPTIME
TOPICAL | Status: DC | PRN
  Administered 2013-06-04: 1000 mL

## 2013-06-04 MED ORDER — DEXTROSE 5 % IV SOLN
1.5000 g | INTRAVENOUS | Status: AC
  Administered 2013-06-04: 1.5 g via INTRAVENOUS
  Filled 2013-06-04: qty 1.5

## 2013-06-04 MED ORDER — ACETAMINOPHEN 325 MG PO TABS: 325.0000 mg | ORAL_TABLET | ORAL | Status: DC | PRN

## 2013-06-04 MED ORDER — HEPARIN SODIUM (PORCINE) 1000 UNIT/ML IJ SOLN
INTRAMUSCULAR | Status: DC | PRN
  Administered 2013-06-04: 1000 [IU] via INTRAVENOUS
  Administered 2013-06-04: 6000 [IU] via INTRAVENOUS

## 2013-06-04 MED ORDER — FUROSEMIDE 40 MG PO TABS
40.0000 mg | ORAL_TABLET | Freq: Two times a day (BID) | ORAL | Status: DC
  Administered 2013-06-05 – 2013-06-06 (×3): 40 mg via ORAL
  Filled 2013-06-04 (×5): qty 1

## 2013-06-04 MED ORDER — MIDAZOLAM HCL 5 MG/5ML IJ SOLN
INTRAMUSCULAR | Status: DC | PRN
  Administered 2013-06-04: 2 mg via INTRAVENOUS

## 2013-06-04 MED ORDER — ONDANSETRON HCL 4 MG/2ML IJ SOLN: 4.0000 mg | Freq: Four times a day (QID) | INTRAMUSCULAR | Status: DC | PRN

## 2013-06-04 MED ORDER — PHENOL 1.4 % MT LIQD: 1.0000 | OROMUCOSAL | Status: DC | PRN

## 2013-06-04 MED ORDER — FENTANYL CITRATE 0.05 MG/ML IJ SOLN
INTRAMUSCULAR | Status: DC | PRN
  Administered 2013-06-04: 50 ug via INTRAVENOUS

## 2013-06-04 MED ORDER — DEXTROSE 5 % IV SOLN
1.5000 g | Freq: Two times a day (BID) | INTRAVENOUS | Status: AC
  Administered 2013-06-05 (×2): 1.5 g via INTRAVENOUS
  Filled 2013-06-04 (×2): qty 1.5

## 2013-06-04 SURGICAL SUPPLY — 69 items
BANDAGE ELASTIC 4 VELCRO ST LF (GAUZE/BANDAGES/DRESSINGS) IMPLANT
BANDAGE ESMARK 6X9 LF (GAUZE/BANDAGES/DRESSINGS) IMPLANT
BNDG ESMARK 6X9 LF (GAUZE/BANDAGES/DRESSINGS)
CANISTER SUCTION 2500CC (MISCELLANEOUS) ×2 IMPLANT
CATH EMB 2FR 60CM (CATHETERS) ×2 IMPLANT
CATH EMB 3FR 80CM (CATHETERS) ×2 IMPLANT
CATH EMB 4FR 80CM (CATHETERS) IMPLANT
CATH EMB 5FR 80CM (CATHETERS) IMPLANT
CLIP TI MEDIUM 24 (CLIP) ×2 IMPLANT
CLIP TI WIDE RED SMALL 24 (CLIP) ×2 IMPLANT
CLOTH BEACON ORANGE TIMEOUT ST (SAFETY) ×2 IMPLANT
COVER SURGICAL LIGHT HANDLE (MISCELLANEOUS) ×2 IMPLANT
COVER TRANSDUCER ULTRASND GEL (DRAPE) ×2 IMPLANT
CUFF TOURNIQUET SINGLE 24IN (TOURNIQUET CUFF) IMPLANT
CUFF TOURNIQUET SINGLE 34IN LL (TOURNIQUET CUFF) IMPLANT
CUFF TOURNIQUET SINGLE 44IN (TOURNIQUET CUFF) IMPLANT
DERMABOND ADVANCED (GAUZE/BANDAGES/DRESSINGS) ×1
DERMABOND ADVANCED .7 DNX12 (GAUZE/BANDAGES/DRESSINGS) ×1 IMPLANT
DRAIN CHANNEL 15F RND FF W/TCR (WOUND CARE) IMPLANT
DRAPE WARM FLUID 44X44 (DRAPE) IMPLANT
DRAPE X-RAY CASS 24X20 (DRAPES) IMPLANT
DRSG COVADERM 4X10 (GAUZE/BANDAGES/DRESSINGS) IMPLANT
DRSG COVADERM 4X8 (GAUZE/BANDAGES/DRESSINGS) IMPLANT
ELECT REM PT RETURN 9FT ADLT (ELECTROSURGICAL) ×2
ELECTRODE REM PT RTRN 9FT ADLT (ELECTROSURGICAL) ×1 IMPLANT
EVACUATOR SILICONE 100CC (DRAIN) IMPLANT
GLOVE BIO SURGEON STRL SZ 6.5 (GLOVE) ×2 IMPLANT
GLOVE BIO SURGEON STRL SZ7.5 (GLOVE) ×2 IMPLANT
GLOVE BIOGEL PI IND STRL 6.5 (GLOVE) ×1 IMPLANT
GLOVE BIOGEL PI IND STRL 7.0 (GLOVE) ×1 IMPLANT
GLOVE BIOGEL PI IND STRL 7.5 (GLOVE) ×2 IMPLANT
GLOVE BIOGEL PI INDICATOR 6.5 (GLOVE) ×1
GLOVE BIOGEL PI INDICATOR 7.0 (GLOVE) ×1
GLOVE BIOGEL PI INDICATOR 7.5 (GLOVE) ×2
GLOVE SS BIOGEL STRL SZ 7 (GLOVE) ×1 IMPLANT
GLOVE SUPERSENSE BIOGEL SZ 7 (GLOVE) ×1
GLOVE SURG SS PI 7.0 STRL IVOR (GLOVE) ×2 IMPLANT
GLOVE SURG SS PI 7.5 STRL IVOR (GLOVE) ×2 IMPLANT
GOWN PREVENTION PLUS XXLARGE (GOWN DISPOSABLE) ×2 IMPLANT
GOWN STRL NON-REIN LRG LVL3 (GOWN DISPOSABLE) ×8 IMPLANT
HEMOSTAT SNOW SURGICEL 2X4 (HEMOSTASIS) IMPLANT
KIT BASIN OR (CUSTOM PROCEDURE TRAY) ×2 IMPLANT
KIT ROOM TURNOVER OR (KITS) ×2 IMPLANT
NS IRRIG 1000ML POUR BTL (IV SOLUTION) ×4 IMPLANT
PACK PERIPHERAL VASCULAR (CUSTOM PROCEDURE TRAY) ×2 IMPLANT
PAD ARMBOARD 7.5X6 YLW CONV (MISCELLANEOUS) ×4 IMPLANT
PADDING CAST COTTON 6X4 STRL (CAST SUPPLIES) IMPLANT
SET COLLECT BLD 21X3/4 12 (NEEDLE) IMPLANT
STAPLER VISISTAT 35W (STAPLE) IMPLANT
STOPCOCK 4 WAY LG BORE MALE ST (IV SETS) IMPLANT
SUT ETHILON 3 0 PS 1 (SUTURE) IMPLANT
SUT PROLENE 5 0 C 1 24 (SUTURE) IMPLANT
SUT PROLENE 6 0 BV (SUTURE) ×8 IMPLANT
SUT PROLENE 7 0 BV 1 (SUTURE) IMPLANT
SUT SILK 3 0 (SUTURE)
SUT SILK 3-0 18XBRD TIE 12 (SUTURE) IMPLANT
SUT VIC AB 2-0 CT1 27 (SUTURE) ×1
SUT VIC AB 2-0 CT1 TAPERPNT 27 (SUTURE) ×1 IMPLANT
SUT VIC AB 3-0 SH 27 (SUTURE) ×1
SUT VIC AB 3-0 SH 27X BRD (SUTURE) ×1 IMPLANT
SUT VICRYL 4-0 PS2 18IN ABS (SUTURE) ×2 IMPLANT
SYR 3ML LL SCALE MARK (SYRINGE) ×2 IMPLANT
SYR TB 1ML LUER SLIP (SYRINGE) ×2 IMPLANT
TOWEL OR 17X24 6PK STRL BLUE (TOWEL DISPOSABLE) ×4 IMPLANT
TOWEL OR 17X26 10 PK STRL BLUE (TOWEL DISPOSABLE) ×4 IMPLANT
TRAY FOLEY CATH 14FRSI W/METER (CATHETERS) IMPLANT
TUBING EXTENTION W/L.L. (IV SETS) IMPLANT
UNDERPAD 30X30 INCONTINENT (UNDERPADS AND DIAPERS) ×2 IMPLANT
WATER STERILE IRR 1000ML POUR (IV SOLUTION) ×2 IMPLANT

## 2013-06-04 NOTE — Progress Notes (Signed)
ANTICOAGULATION CONSULT NOTE - Initial Consult  Pharmacy Consult for Heparin Indication: atrial fibrillation and s/p embolectomy, right arm  No Known Allergies  Patient Measurements: Height: 5\' 7"  (170.2 cm) Weight: 195 lb 15.8 oz (88.9 kg) IBW/kg (Calculated) : 66.1 Heparin Dosing Weight: 88.9 kg  Vital Signs: Temp: 97.4 F (36.3 C) (07/10 2100) Temp src: Oral (07/10 2100) BP: 126/93 mmHg (07/10 2100) Pulse Rate: 100 (07/10 2100)  Labs:  Recent Labs  06/04/13 1253  HGB 15.0  HCT 43.5  PLT 259  LABPROT 20.4*  INR 1.80*  CREATININE 1.16    Estimated Creatinine Clearance: 84.6 ml/min (by C-G formula based on Cr of 1.16).   Medical History: Past Medical History  Diagnosis Date  . Ventricular tachycardia   . AICD (automatic cardioverter/defibrillator) present     [employer] 100  . Tobacco abuse   . Alcohol abuse     hx  . Cardiomyopathy     secondary  . HTN (hypertension)     uncontrolled  . CHF (congestive heart failure)   . Gout    Assessment:  47 yr old man, s/p urgent embolectomy for ischemic right arm.  Thrombus noted to extend from brachial artery into the radial and ulnar arteries.  Out of OR ~6pm. Received 7000 units IV heparin during procedure.  He reports taking Coumadin 10 mg on MWF and Sunday.  Last dose taken 7/9.  INR 1.8 today.  Coumadin to resume on 7/11.  Surgical site closed. No bleeding noted.  Goal of Therapy:  INR 2-3 Heparin level 0.3-0.7 units/ml Monitor platelets by anticoagulation protocol: Yes   Plan:   Will begin heparin drip at 1250 units/hr (~ 14 units/kg/hr).  Heparin level with am labs should be ~6-8 hrs after drip begins.     Daily heparin level, PT/INR and CBC.   Coumadin to begin 7/11.  Dennie Fetters, RPh Pager: [phone] 06/04/2013,10:09 PM

## 2013-06-04 NOTE — Progress Notes (Signed)
Chaplain Note: Met pt's mother in hallway. She looked exhausted and I engaged her in conversation.  She told me about pt's catheterization and her hopes that he will go home tomorrow.  I engaged in active and reflective listening as she talked about her long day, her son and her husband.  Offered words of encouragement and prayed with her.  Charlsie Quest  [phone]

## 2013-06-04 NOTE — Anesthesia Procedure Notes (Signed)
Procedure Name: Intubation Date/Time: 06/04/2013 4:26 PM Performed by: Charm Barges, Orlando Thalmann R Pre-anesthesia Checklist: Emergency Drugs available, Patient identified, Suction available, Patient being monitored and Timeout performed Patient Re-evaluated:Patient Re-evaluated prior to inductionOxygen Delivery Method: Circle system utilized Preoxygenation: Pre-oxygenation with 100% oxygen Intubation Type: IV induction Ventilation: Mask ventilation without difficulty Laryngoscope Size: Mac and 3 Grade View: Grade II Tube type: Oral Tube size: 7.0 mm Number of attempts: 1 Airway Equipment and Method: Stylet Placement Confirmation: ETT inserted through vocal cords under direct vision,  breath sounds checked- equal and bilateral and positive ETCO2 Secured at: 23 cm Tube secured with: Tape Dental Injury: Teeth and Oropharynx as per pre-operative assessment

## 2013-06-04 NOTE — ED Notes (Signed)
Pt brought to ED by EMS with complains of rt arm pain with numbness.Denies any chest pain ,SOB or headache.GCC 15

## 2013-06-04 NOTE — ED Provider Notes (Signed)
History    CSN: [id number] Arrival date & time 06/04/13  1227  First MD Initiated Contact with Patient 06/04/13 1232     Chief Complaint  Patient presents with  . Extremity Pain   (Consider location/radiation/quality/duration/timing/severity/associated sxs/prior Treatment) HPI This 47 year old male woke up this morning at 8:00 with mild right arm pain which is progressively worsened over the last 5 hours and he has also developed gradual onset gradually worsening now moderate weakness and numbness to his entire right arm. There is no trauma. He has a history of ischemic cardiomyopathy, congestive heart failure, ventricular tachycardia, atrial fibrillation, and is on Coumadin and has an ICD, he has no chest pain palpitation shortness of breath lightheadedness abdominal pain vomiting recent illnesses or other concerns. Past Medical History  Diagnosis Date  . Ventricular tachycardia   . AICD (automatic cardioverter/defibrillator) present     [employer] 100  . Tobacco abuse   . Alcohol abuse     hx  . Cardiomyopathy     secondary  . HTN (hypertension)     uncontrolled  . CHF (congestive heart failure)   . Gout    Past Surgical History  Procedure Laterality Date  . Cardiac defibrillator placement  2010    [employer] 100  . Embolectomy Right 06/04/2013    Procedure: Upper Extremity Thrombectomy;  Surgeon: Nada Libman, MD;  Location: [facility] OR;  Service: Vascular;  Laterality: Right;   Family History  Problem Relation Age of Onset  . Cardiomyopathy Father     on a defib  . Diabetes Father    History  Substance Use Topics  . Smoking status: Current Every Day Smoker -- 0.50 packs/day for 30 years    Types: Cigarettes  . Smokeless tobacco: Never Used     Comment: I already have information on quitting"  . Alcohol Use: 0.6 oz/week    1 Cans of beer per week    Review of Systems 10 Systems reviewed and are negative for acute change except as  noted in the HPI. Allergies  Review of patient's allergies indicates no known allergies.  Home Medications   Current Outpatient Rx  Name  Route  Sig  Dispense  Refill  . allopurinol (ZYLOPRIM) 100 MG tablet   Oral   Take 1 tablet (100 mg total) by mouth daily.   30 tablet   3   . carvedilol (COREG) 6.25 MG tablet   Oral   Take 6.25 mg by mouth 2 (two) times daily with a meal.         . digoxin (LANOXIN) 0.25 MG tablet   Oral   Take 0.25 mg by mouth daily.         . furosemide (LASIX) 40 MG tablet   Oral   Take 1 tablet (40 mg total) by mouth 2 (two) times daily.   60 tablet   3   . lisinopril (PRINIVIL,ZESTRIL) 20 MG tablet   Oral   Take 20 mg by mouth daily.           Marland Kitchen spironolactone (ALDACTONE) 25 MG tablet   Oral   Take 25 mg by mouth daily.           Marland Kitchen warfarin (COUMADIN) 10 MG tablet   Oral   Take 10 mg by mouth every other day.          . oxyCODONE-acetaminophen (PERCOCET/ROXICET) 5-325 MG per tablet   Oral   Take 1-2 tablets by mouth every  4 (four) hours as needed for pain.   30 tablet   0    BP 104/70  Pulse 52  Temp(Src) 97.4 F (36.3 C) (Oral)  Resp 18  Ht 5\' 7"  (1.702 m)  Wt 195 lb 15.8 oz (88.9 kg)  BMI 30.69 kg/m2  SpO2 94% Physical Exam  Nursing note and vitals reviewed. Constitutional:  Awake, alert, nontoxic appearance.  HENT:  Head: Atraumatic.  Eyes: Right eye exhibits no discharge. Left eye exhibits no discharge.  Neck: Neck supple.  Cardiovascular: Normal rate and regular rhythm.   No murmur heard. Pulmonary/Chest: Effort normal and breath sounds normal. No respiratory distress. He has no wheezes. He has no rales. He exhibits no tenderness.  Abdominal: Soft. Bowel sounds are normal. He exhibits no distension. There is no tenderness. There is no rebound and no guarding.  Musculoskeletal: He exhibits tenderness. He exhibits no edema.  nontender left arm and legs. Right arm is cool to touch with absent capillary refill  right hand absent radial pulse decreased light touch entire right arm moderate weakness entire right arm suggestive of acutely ischemic limb.  Neurological: He is alert.  Mental status and motor strength appears baseline for patient and situation.  Skin: No rash noted.  Psychiatric: He has a normal mood and affect.    ED Course  Procedures (including critical care time) ECG: sinus rhythm, ventricular rate 78, right axis deviationno significant change noted compared with [id number]  D/w Vasc Surg will see Pt in ED. Vasc Surg aware INR pending. 1257 Await OR. 1520 Patient / Family / Caregiver informed of clinical course, understand medical decision-making process, and agree with plan. Labs Reviewed  PROTIME-INR - Abnormal; Notable for the following:    Prothrombin Time 20.4 (*)    INR 1.80 (*)    All other components within normal limits  BASIC METABOLIC PANEL - Abnormal; Notable for the following:    Glucose, Bld 139 (*)    GFR calc non Af Amer 74 (*)    GFR calc Af Amer 86 (*)    All other components within normal limits  CBC WITH DIFFERENTIAL - Abnormal; Notable for the following:    RDW 16.7 (*)    All other components within normal limits  CBC - Abnormal; Notable for the following:    RDW 17.0 (*)    All other components within normal limits  BASIC METABOLIC PANEL - Abnormal; Notable for the following:    Glucose, Bld 172 (*)    GFR calc non Af Amer 83 (*)    All other components within normal limits  HEPARIN LEVEL (UNFRACTIONATED) - Abnormal; Notable for the following:    Heparin Unfractionated 0.14 (*)    All other components within normal limits  PROTIME-INR - Abnormal; Notable for the following:    Prothrombin Time 23.0 (*)    INR 2.11 (*)    All other components within normal limits  CBC - Abnormal; Notable for the following:    RDW 17.0 (*)    All other components within normal limits  PROTIME-INR - Abnormal; Notable for the following:    Prothrombin Time 27.1 (*)     INR 2.62 (*)    All other components within normal limits  MRSA PCR SCREENING  DIGOXIN LEVEL  HEPARIN LEVEL (UNFRACTIONATED)  HEPARIN LEVEL (UNFRACTIONATED)   No results found. 1. Embolism and thrombosis of brachial artery     MDM  The patient appears reasonably stabilized for admission considering the current resources, flow, and  capabilities available in the ED at this time, and I doubt any other [facility] requiring further screening and/or treatment in the ED prior to admission.  Hurman Horn, MD 06/06/13 2015

## 2013-06-04 NOTE — Op Note (Addendum)
[facility] [place]  Patient name: Parker Johnson MRN: [id number] DOB: [birth year] Sex: male  06/04/2013 Pre-operative Diagnosis: Ischemic right arm Post-operative diagnosis:  Same Surgeon:  Jorge Ny Assistants:  Narda Amber Procedure:   Thrombectomy of right brachial, radial, and ulnar artery with primary closure of the brachial artery Anesthesia:  Gen. Blood Loss:  See anesthesia record Specimens:  Thrombus from brachial artery  Findings:  Thrombus extending from the brachial artery into the radial and ulnar artery which was completely evacuated  Indications:  The patient began having symptoms early this morning consistent of pain and numbness. He came to the emergency department and was diagnosed with an ischemic right extremity. He was taken urgently to the operating room. He is chronically on Coumadin for cardiomyopathy. His INR was 1.8.  Procedure:  The patient was identified in the holding area and taken to [facility] OR ROOM 12  The patient was then placed supine on the table. general anesthesia was administered.  The patient was prepped and draped in the usual sterile fashion.  A time out was called and antibiotics were administered.  Ultrasound was used to identify the brachial artery and its bifurcation. This was identified just distal to the antecubital crease. A longitudinal incision was made at this level. Cautery was used to divide the subcutaneous tissue down to the fascia which was then opened with cautery. I exposed the brachial artery, radial artery, and ulnar artery. Several large veins had to be ligated for adequate exposure. The patient was fully heparinized. After the heparin circulated a transverse arteriotomy was made with a #11 blade. Acute thrombus was identified within the brachial artery. I passed a #3 Fogarty proximally without resistance. I performed thrombectomy and after evacuating clot, excellent inflow was established. 1 negative passes with  the Fogarty catheter was performed. I then selected a #2 Fogarty catheter and individually passed the down the radial and ulnar arteries, evacuating thrombus with each pass until no thrombus was evacuated. There was good backbleeding. I then closed the arteriotomy with 2 running 6-0 Prolene sutures. Doppler was used to interrogate the radial and ulnar artery. I was not satisfied with the signal that was acquired. I used ultrasound and map the radial artery. There appeared to be residual thrombus. I therefore elected to open the arteriotomy again and pass a #2 Fogarty catheter again down the radial and ulnar artery. I confirmed that the catheter was going across the wrist. Some thrombus was evacuated. Again there was good backbleeding. I nature there was good inflow. I closed the arteriotomy again with running 6-0 Prolene. There was a brisk Doppler signal in the radial and ulnar artery after this maneuver. I did not reverse the heparin. Once hemostasis was achieved, the fascia was reapproximated with 3-0 Vicryl. The subcutaneous tissue was closed with 3-0 Vicryl the skin was closed with 4-0 Vicryl. Dermabond was placed on the skin. There were no complications.   Disposition:  To PACU in stable condition.   Juleen China, M.D. [facility] [place] Office: [phone] Pager:  [phone]

## 2013-06-04 NOTE — Anesthesia Postprocedure Evaluation (Signed)
  Anesthesia Post-op Note  Patient: Parker Johnson  Procedure(s) Performed: Procedure(s): Upper Extremity Thrombectomy (Right)  Patient Location: PACU  Anesthesia Type:General  Level of Consciousness: awake, alert  and oriented  Airway and Oxygen Therapy: Patient Spontanous Breathing and Patient connected to nasal cannula oxygen  Post-op Pain: mild  Post-op Assessment: Post-op Vital signs reviewed, Patient's Cardiovascular Status Stable, Respiratory Function Stable, Patent Airway and Pain level controlled  Post-op Vital Signs: stable  Complications: No apparent anesthesia complications

## 2013-06-04 NOTE — Progress Notes (Signed)
VASCULAR LAB PRELIMINARY  PRELIMINARY  PRELIMINARY  PRELIMINARY  Right upper extremity arterial duplex completed.    Preliminary report:  There is thrombus noted in the axillary and in the brachial arteries of the right upper extremity.  Adel Burch, RVT 06/04/2013, 4:24 PM

## 2013-06-04 NOTE — H&P (Signed)
[facility] [place]      History and Physical  Patient name: Parker Johnson MRN: [id number] DOB: 30-May-1966 Sex: male   Reason for Admission:  Chief Complaint  Patient presents with  . Extremity Pain    HISTORY OF PRESENT ILLNESS: This is a 47 year old gentleman who awoke this morning around 8:30 and began experiencing numbness in his right arm. He went on to the barbershop and it got worse and he began having pain. This prompted him to come to the hospital. He states that it is cold and non-and hurting. It has been this way since 8:30 this morning. There are no relieving factors. The pain is constant.  The patient has a history of congestive heart failure. He has a defibrillator in place. He has been on anticoagulation for his heart failure. He is medically managed for hypertension.  Past Medical History  Diagnosis Date  . Ventricular tachycardia   . AICD (automatic cardioverter/defibrillator) present     [employer]  . Tobacco abuse   . Alcohol abuse     hx  . Cardiomyopathy     secondary  . HTN (hypertension)     uncontrolled  . CHF (congestive heart failure)   . Gout     Past Surgical History  Procedure Laterality Date  . Cardiac defibrillator placement  2010    [employer] 100    History   Social History  . Marital Status: Married    Spouse Name: N/A    Number of Children: N/A  . Years of Education: N/A   Occupational History  . Not on file.   Social History Main Topics  . Smoking status: Current Every Day Smoker -- 0.50 packs/day for 30 years    Types: Cigarettes  . Smokeless tobacco: Never Used     Comment: I already have information on quitting"  . Alcohol Use: 0.6 oz/week    1 Cans of beer per week  . Drug Use: No     Comment: history of cocaine use that is not very recent (1-2 yrs)  . Sexually Active: Yes    Birth Control/ Protection: None   Other Topics Concern  . Not on file   Social  History Narrative   Disabled. Did not finish the 12th grade but is interested in pursuing a GED.     Family History  Problem Relation Age of Onset  . Cardiomyopathy Father     on a defib  . Diabetes Father     Allergies as of 06/04/2013  . (No Known Allergies)    No current facility-administered medications on file prior to encounter.   Current Outpatient Prescriptions on File Prior to Encounter  Medication Sig Dispense Refill  . allopurinol (ZYLOPRIM) 100 MG tablet Take 1 tablet (100 mg total) by mouth daily.  30 tablet  3  . carvedilol (COREG) 6.25 MG tablet Take 6.25 mg by mouth 2 (two) times daily with a meal.      . digoxin (LANOXIN) 0.25 MG tablet Take 0.25 mg by mouth daily.      . furosemide (LASIX) 40 MG tablet Take 1 tablet (40 mg total) by mouth 2 (two) times daily.  60 tablet  3  . lisinopril (PRINIVIL,ZESTRIL) 20 MG tablet Take 20 mg by mouth daily.        Marland Kitchen spironolactone (ALDACTONE) 25 MG tablet Take 25 mg by mouth daily.        Marland Kitchen warfarin (COUMADIN) 10 MG  tablet Take 10 mg by mouth every other day.          REVIEW OF SYSTEMS: All systems negative except as mentioned in the history of present illness  PHYSICAL EXAMINATION: General: The patient appears their stated age.  Vital signs are BP 124/95  Pulse 79  Temp(Src) 97.7 F (36.5 C) (Oral)  Resp 22  SpO2 100% HEENT:  No gross abnormalities Pulmonary: Respirations are non-labored Musculoskeletal: There are no major deformities.   Neurologic: No focal weakness or paresthesias are detected, Skin: There are no ulcer or rashes noted. Psychiatric: The patient has normal affect. Cardiovascular: There is a regular rate and rhythm without significant murmur appreciated. I cannot palpate any pulses in the right arm  Diagnostic Studies: Duplex shows axillary and brachial artery occlusion thrombus   Assessment:  Ischemic right arm Plan: The patient was taken emergently to the operating room for thrombectomy of  his right arm. This is a limb threatening situation.     Jorge Ny, M.D. [facility] [place] Office: [phone] Pager:  [phone]

## 2013-06-04 NOTE — Anesthesia Preprocedure Evaluation (Addendum)
Anesthesia Evaluation  Patient identified by MRN, date of birth, ID band Patient awake    Reviewed: Allergy & Precautions, H&P , NPO status , Patient's Chart, lab work & pertinent test results, reviewed documented beta blocker date and time   Airway Mallampati: II TM Distance: >3 FB Neck ROM: Full    Dental  (+) Teeth Intact and Dental Advisory Given   Pulmonary  breath sounds clear to auscultation        Cardiovascular hypertension, Pt. on medications and Pt. on home beta blockers +CHF + dysrhythmias Ventricular Tachycardia + Cardiac Defibrillator Rhythm:Regular Rate:Normal     Neuro/Psych    GI/Hepatic   Endo/Other    Renal/GU      Musculoskeletal   Abdominal   Peds  Hematology   Anesthesia Other Findings   Reproductive/Obstetrics                          Anesthesia Physical Anesthesia Plan  ASA: III  Anesthesia Plan: General   Post-op Pain Management:    Induction: Intravenous  Airway Management Planned: Oral ETT  Additional Equipment:   Intra-op Plan:   Post-operative Plan: Extubation in OR  Informed Consent: I have reviewed the patients History and Physical, chart, labs and discussed the procedure including the risks, benefits and alternatives for the proposed anesthesia with the patient or authorized representative who has indicated his/her understanding and acceptance.   Dental advisory given  Plan Discussed with: CRNA, Anesthesiologist and Surgeon  Anesthesia Plan Comments: (Acute ischemia Right . Arm Nonischemic cardiomyopathy with AICD placed 08/18/09 has never discharged, h/o CHFwell controlled On coumadin for cardiomyopathy htn Smoker   Plan GA with art line, magnet over AICD )       Anesthesia Quick Evaluation

## 2013-06-04 NOTE — Progress Notes (Signed)
Report given to David RN.

## 2013-06-04 NOTE — Transfer of Care (Signed)
Immediate Anesthesia Transfer of Care Note  Patient: Parker Johnson  Procedure(s) Performed: Procedure(s): Upper Extremity Thrombectomy (Right)  Patient Location: PACU  Anesthesia Type:General  Level of Consciousness: awake, alert  and oriented  Airway & Oxygen Therapy: Patient Spontanous Breathing and Patient connected to nasal cannula oxygen  Post-op Assessment: Report given to PACU RN, Post -op Vital signs reviewed and stable and Patient moving all extremities  Post vital signs: Reviewed and stable  Complications: No apparent anesthesia complications

## 2013-06-05 ENCOUNTER — Encounter (HOSPITAL_COMMUNITY): Payer: Self-pay | Admitting: *Deleted

## 2013-06-05 LAB — BASIC METABOLIC PANEL
Calcium: 8.5 mg/dL (ref 8.4–10.5)
GFR calc Af Amer: >90 mL/min (ref >90)
GFR calc non Af Amer: 83 mL/min — ABNORMAL LOW (ref >90)
Sodium: 136 mEq/L (ref 135–145)

## 2013-06-05 LAB — CBC
MCH: 31.7 pg (ref 26.0–34.0)
MCHC: 33.9 g/dL (ref 30.0–36.0)
Platelets: 212 10*3/uL (ref 150–400)

## 2013-06-05 LAB — HEPARIN LEVEL (UNFRACTIONATED): Heparin Unfractionated: 0.14 IU/mL — ABNORMAL LOW (ref 0.30–0.70)

## 2013-06-05 MED ORDER — WARFARIN SODIUM 10 MG PO TABS
10.0000 mg | ORAL_TABLET | ORAL | Status: AC
  Administered 2013-06-05: 10 mg via ORAL
  Filled 2013-06-05: qty 1

## 2013-06-05 MED ORDER — WARFARIN - PHARMACIST DOSING INPATIENT: Freq: Every day | Status: DC

## 2013-06-05 MED ORDER — WARFARIN SODIUM 10 MG PO TABS
10.0000 mg | ORAL_TABLET | Freq: Once | ORAL | Status: AC
  Administered 2013-06-05: 10 mg via ORAL
  Filled 2013-06-05: qty 1

## 2013-06-05 NOTE — Evaluation (Signed)
Physical Therapy One Time Evaluation Patient Details Name: Parker Johnson MRN: [id number] DOB: [birth year] Today's Date: 06/05/2013 Time: 4098-1191 PT Time Calculation (min): 17 min  PT Assessment / Plan / Recommendation History of Present Illness  Patient is a 47 y/o male admitted with right brachial/radioulnar thrombus now s/p thrombectomy.  PMH positive for CHF with implanted defibrillator.  Clinical Impression  Patient ambulating independent no devices.  Did educate on activity level after d/c and need for smoking cessation.  No further PT needs identified.    PT Assessment  Patent does not need any further PT services    Follow Up Recommendations  No PT follow up                   Precautions / Restrictions Precautions Precautions: None   Pertinent Vitals/Pain 3/10 right UE; HR 80, SpO2 93-87 on room air      Mobility  Bed Mobility Bed Mobility: Supine to Sit;Sit to Supine Supine to Sit: 7: Independent Sit to Supine: 7: Independent Transfers Transfers: Sit to Stand;Stand to Sit Sit to Stand: From bed;7: Independent Stand to Sit: 7: Independent;To bed Ambulation/Gait Ambulation/Gait Assistance: 7: Independent Ambulation Distance (Feet): 300 Feet Assistive device: None Gait Pattern: Within Functional Limits Stairs: Yes Stairs Assistance: 6: Modified independent (Device/Increase time) Stair Management Technique: One rail Right;Forwards Number of Stairs: 3        PT Goals(Current goals can be found in the care plan section) Acute Rehab PT Goals PT Goal Formulation: No goals set, d/c therapy  Visit Information  Last PT Received On: 06/05/13 Assistance Needed: +1 History of Present Illness: Patient is a 47 y/o male admitted with right brachial/radioulnar thrombus now s/p thrombectomy.  PMH positive for CHF with implanted defibrillator.       Prior Functioning  Home Living Family/patient expects to be discharged to:: Private residence Living  Arrangements: Spouse/significant other Available Help at Discharge: Family Type of Home: House Home Access: Stairs to enter Secretary/administrator of Steps: 5-6 Home Layout: One level Home Equipment: None Prior Function Level of Independence: Independent Communication Communication: No difficulties Dominant Hand: Right    Cognition  Cognition Arousal/Alertness: Awake/alert Behavior During Therapy: WFL for tasks assessed/performed Overall Cognitive Status: Within Functional Limits for tasks assessed    Extremity/Trunk Assessment Upper Extremity Assessment Upper Extremity Assessment: Defer to OT evaluation Lower Extremity Assessment Lower Extremity Assessment: Overall WFL for tasks assessed Cervical / Trunk Assessment Cervical / Trunk Assessment: Normal   Balance Balance Balance Assessed: Yes Static Standing Balance Static Standing - Balance Support: No upper extremity supported Static Standing - Level of Assistance: 7: Independent  End of Session PT - End of Session Equipment Utilized During Treatment: Gait belt Activity Tolerance: Patient tolerated treatment well Patient left: in bed;with call bell/phone within reach  GP     [facility] 06/05/2013, 9:41 AM Sheran Lawless, PT [phone] 06/05/2013

## 2013-06-05 NOTE — Progress Notes (Signed)
ANTICOAGULATION CONSULT NOTE - Pharmacy Consult for Heparin Indication: atrial fibrillation and s/p embolectomy, right arm  No Known Allergies  Patient Measurements: Height: 5\' 7"  (170.2 cm) Weight: 195 lb 15.8 oz (88.9 kg) IBW/kg (Calculated) : 66.1 Heparin Dosing Weight: 88.9 kg  Vital Signs: Temp: 98 F (36.7 C) (07/11 1818) Temp src: Oral (07/11 1818) BP: 113/84 mmHg (07/11 1219) Pulse Rate: 74 (07/11 1219)  Labs:  Recent Labs  06/04/13 1253 06/05/13 0430 06/05/13 1741  HGB 15.0 14.4  --   HCT 43.5 42.5  --   PLT 259 212  --   LABPROT 20.4* 23.0*  --   INR 1.80* 2.11*  --   HEPARINUNFRC  --  0.14* 0.39  CREATININE 1.16 1.05  --     Estimated Creatinine Clearance: 93.5 ml/min (by C-G formula based on Cr of 1.05).   Assessment: 47 yr old male s/p urgent embolectomy 7/10, h/o Afib, transitioning continues on heparin bridge until INR ~ 2.5 per vascular team.  Heparin is therapeutic on 1450 units/hr.    Goal of Therapy:  INR 2-3 Heparin level 0.3-0.7 units/ml Monitor platelets by anticoagulation protocol: Yes   Plan:  Continue Heparin at 1450 units/hr Daily heparin level, CBC, and INR   [employer], Pharm.D., BCPS Clinical Pharmacist Pager [phone] 06/05/2013 7:25 PM

## 2013-06-05 NOTE — Progress Notes (Signed)
Pt admitted into room 3316. VSS. Incision to right forearm is CDI with minimal bruising. Right radial and ulnar pulses are palpable. CCMD and [facility] notified about transfer. Dr. Myra Gianotti is also at the bedside speaking with the pt and his family. Will continue to monitor closely.

## 2013-06-05 NOTE — Progress Notes (Signed)
Pt has had a 7 beat run of v tach and a 9 beat run of v tach. He does not c/o pain. VSS and his ICD did not fire. Dr. Myra Gianotti notified. No new orders received. Will continue to monitor.

## 2013-06-05 NOTE — Progress Notes (Signed)
ANTICOAGULATION CONSULT NOTE - Pharmacy Consult for  heparin Indication: atrial fibrillation and s/p embolectomy, right arm  No Known Allergies  Patient Measurements: Height: 5\' 7"  (170.2 cm) Weight: 195 lb 15.8 oz (88.9 kg) IBW/kg (Calculated) : 66.1 Heparin Dosing Weight: 88.9 kg  Vital Signs: Temp: 97.2 F (36.2 C) (07/11 1219) Temp src: Oral (07/11 1219) BP: 113/84 mmHg (07/11 1219) Pulse Rate: 74 (07/11 1219)  Labs:  Recent Labs  06/04/13 1253 06/05/13 0430  HGB 15.0 14.4  HCT 43.5 42.5  PLT 259 212  LABPROT 20.4* 23.0*  INR 1.80* 2.11*  HEPARINUNFRC  --  0.14*  CREATININE 1.16 1.05    Estimated Creatinine Clearance: 93.5 ml/min (by C-G formula based on Cr of 1.05).   Assessment:  47 yr old male s/p urgent embolectomy, h/o Afib, transitioning from heparin to coumadin. INR was 2.1 this morning, Vascular would like to see INR closer to 2.5 before stopping heparin bridge therapy. No bleeding issues noted, cbc fairly stable POD#1. Repeat heparin level scheduled for this afternoon.  Goal of Therapy:  INR 2-3 Heparin level 0.3-0.7 units/ml Monitor platelets by anticoagulation protocol: Yes   Plan:  Heparin 1450 units/hr Check heparin level this afternoon Warfarin 10mg  tonight Daily HL/INR  [name] PharmD., BCPS Clinical Pharmacist Pager [phone] 06/05/2013 3:57 PM

## 2013-06-05 NOTE — Progress Notes (Signed)
ANTICOAGULATION CONSULT NOTE - Pharmacy Consult for  heparin Indication: atrial fibrillation and s/p embolectomy, right arm  No Known Allergies  Patient Measurements: Height: 5\' 7"  (170.2 cm) Weight: 195 lb 15.8 oz (88.9 kg) IBW/kg (Calculated) : 66.1 Heparin Dosing Weight: 88.9 kg  Vital Signs: Temp: 97.9 F (36.6 C) (07/11 0410) Temp src: Oral (07/11 0410) BP: 123/69 mmHg (07/11 0410) Pulse Rate: 63 (07/11 0447)  Labs:  Recent Labs  06/04/13 1253 06/05/13 0430  HGB 15.0 14.4  HCT 43.5 42.5  PLT 259 212  LABPROT 20.4* 23.0*  INR 1.80* 2.11*  HEPARINUNFRC  --  0.14*  CREATININE 1.16  --     Estimated Creatinine Clearance: 84.6 ml/min (by C-G formula based on Cr of 1.16).   Assessment:  47 yr old male s/p urgent embolectomy, h/o Afib, for heparin  Goal of Therapy:  INR 2-3 Heparin level 0.3-0.7 units/ml Monitor platelets by anticoagulation protocol: Yes   Plan:  Increase Heparin 1450 units/hr Check heparin level in 8 hours.  Geannie Risen, PharmD, BCPS   06/05/2013 5:14 AM .

## 2013-06-05 NOTE — Progress Notes (Signed)
[facility] [place]  Subjective  - POD #1, status post right brachial, radial, and ulnar embolectomy  The patient reports that his preoperative symptoms of pain and numbness have resolved. His only complaint is that of discomfort around his incision.   Physical Exam:  Palpable right radial pulse. The forearm compartments are very soft. Numbness and pain are resolved.       Assessment/Plan:  POD #1  The patient will continue with IV heparin until his Coumadin levels are therapeutic. I like to see him at least with an INR of 2.5.  I will contact Dr. Sharyn Lull while he is in the hospital.  Myra Gianotti IV, V. WELLS 06/05/2013 9:50 AM --  Ceasar Mons Vitals:   06/05/13 0910  BP:   Pulse: 80  Temp:   Resp:     Intake/Output Summary (Last 24 hours) at 06/05/13 0950 Last data filed at 06/05/13 0834  Gross per 24 hour  Intake 2034.5 ml  Output    500 ml  Net 1534.5 ml     Laboratory CBC    Component Value Date/Time   WBC 6.0 06/05/2013 0430   HGB 14.4 06/05/2013 0430   HCT 42.5 06/05/2013 0430   PLT 212 06/05/2013 0430    BMET    Component Value Date/Time   NA 136 06/05/2013 0430   K 3.8 06/05/2013 0430   CL 101 06/05/2013 0430   CO2 30 06/05/2013 0430   GLUCOSE 172* 06/05/2013 0430   BUN 12 06/05/2013 0430   CREATININE 1.05 06/05/2013 0430   CALCIUM 8.5 06/05/2013 0430   GFRNONAA 83* 06/05/2013 0430   GFRAA >90 06/05/2013 0430    COAG Lab Results  Component Value Date   INR 2.11* 06/05/2013   INR 1.80* 06/04/2013   INR 2.15* 04/01/2013   No results found for this basename: PTT    Antibiotics Anti-infectives   Start     Dose/Rate Route Frequency Ordered Stop   06/05/13 0400  cefUROXime (ZINACEF) 1.5 g in dextrose 5 % 50 mL IVPB     1.5 g 100 mL/hr over 30 Minutes Intravenous Every 12 hours 06/04/13 2100 06/06/13 0359   06/04/13 1615  cefUROXime (ZINACEF) 1.5 g in dextrose 5 % 50 mL IVPB     1.5 g 100 mL/hr over 30 Minutes Intravenous To Surgery  06/04/13 1604 06/04/13 1615       V. Charlena Cross, M.D. [facility] [place] Office: [phone] Pager:  [phone]

## 2013-06-05 NOTE — Progress Notes (Signed)
Utilization review completed.  

## 2013-06-05 NOTE — Clinical Documentation Improvement (Signed)
THIS DOCUMENT IS NOT A PERMANENT PART OF THE MEDICAL RECORD  Please update your documentation with the medical record to reflect your response to this query. If you need help knowing how to do this please call [phone].  06/05/13  To [facility]  In a better effort to capture your patient's severity of illness, reflect appropriate length of stay and utilization of resources, a review of the patient medical record has revealed the following indicators.    Based on your clinical judgment, please clarify and document in a progress note and/or discharge summary the clinical condition associated with the following supporting information:  In responding to this query please exercise your independent judgment.  The fact that a query is asked, does not imply that any particular answer is desired or expected.  Medicare rules require specification as to whether an inpatient diagnosis was present at the time of admission.   Patient has history of  "systolic chf" and continues on Lasix during this admission.  Please clarify if the following diagnosis,           Chronic systolic chf was:       _ Present at the time of admission  _ NOT present at the time of inpatient admission and it developed during the inpatient stay  _ Unable to clinically determine whether the condition was present on admission.  _  Documentation insufficient to determine if condition was present at the time of inpatient admission  You may use possible, probable, or suspect with inpatient documentation. possible, probable, suspected diagnoses MUST be documented at the time of discharge  Reviewed: h/o chf dcoumented on d/c summary.per orders Clinton Gallant   Thank You,  Leonette Most Amica Harron  Clinical Documentation Specialist: [phone] Health Information Management [place]

## 2013-06-05 NOTE — Progress Notes (Signed)
ANTICOAGULATION CONSULT NOTE - follow up Consult  Pharmacy Consult for coumadin and heparin Indication: atrial fibrillation and s/p embolectomy, right arm  No Known Allergies  Patient Measurements: Height: 5\' 7"  (170.2 cm) Weight: 195 lb 15.8 oz (88.9 kg) IBW/kg (Calculated) : 66.1 Heparin Dosing Weight: 88.9 kg  Vital Signs: Temp: 97.4 F (36.3 C) (07/10 2100) Temp src: Oral (07/10 2100) BP: 126/93 mmHg (07/10 2100) Pulse Rate: 100 (07/10 2100)  Labs:  Recent Labs  06/04/13 1253  HGB 15.0  HCT 43.5  PLT 259  LABPROT 20.4*  INR 1.80*  CREATININE 1.16    Estimated Creatinine Clearance: 84.6 ml/min (by C-G formula based on Cr of 1.16).   Assessment:  47 yr old man, s/p urgent embolectomy for ischemic right arm.  Thrombus noted to extend from brachial artery into the radial and ulnar arteries.  Out of OR ~6pm. Received 7000 units IV heparin during procedure.  He reports taking Coumadin 10 mg on MWF and Sunday.  Last dose taken 7/9.  INR 1.8 today.  Coumadin to resume now.  Surgical site closed. No bleeding noted.  Goal of Therapy:  INR 2-3 Heparin level 0.3-0.7 units/ml Monitor platelets by anticoagulation protocol: Yes   Plan:   continue heparin drip at 1250 units/hr (~ 14 units/kg/hr).  Heparin level with am labs should be ~6-8 hrs after drip begins.     Daily heparin level, PT/INR and CBC.   Coumadin 10 mg now Herby Abraham, Pharm.D. [phone] 06/05/2013 12:30 AM .

## 2013-06-05 NOTE — Evaluation (Signed)
Occupational Therapy Evaluation Patient Details Name: Parker Johnson MRN: [id number] DOB: [birth year] Today's Date: 06/05/2013 Time: 8295-6213 OT Time Calculation (min): 12 min  OT Assessment / Plan / Recommendation History of present illness Patient is a 47 y/o male admitted with right brachial/radioulnar thrombus now s/p thrombectomy.  PMH positive for CHF with implanted defibrillator.   Clinical Impression   Pt c/o pain RUE but is still able to perform ADLs and functional mobility at Independent/mod Independent level.  No further acute OT needs. Will sign off.    OT Assessment  Patient does not need any further OT services    Follow Up Recommendations  No OT follow up    Barriers to Discharge      Equipment Recommendations  None recommended by OT    Recommendations for Other Services    Frequency       Precautions / Restrictions     Pertinent Vitals/Pain See vitals    ADL  Eating/Feeding: Performed;Independent Where Assessed - Eating/Feeding: Edge of bed Grooming: Performed;Wash/dry face;Wash/dry hands Where Assessed - Grooming: Unsupported standing Upper Body Bathing: Simulated;Independent Where Assessed - Upper Body Bathing: Unsupported sitting Lower Body Bathing: Simulated;Independent Where Assessed - Lower Body Bathing: Unsupported sitting Upper Body Dressing: Simulated;Modified independent Where Assessed - Upper Body Dressing: Unsupported sitting Lower Body Dressing: Performed;Modified independent Where Assessed - Lower Body Dressing: Unsupported sit to stand Toilet Transfer: Simulated;Independent Toilet Transfer Method: Sit to Barista:  (bed) Transfers/Ambulation Related to ADLs: independent ADL Comments: Slighlty increased time for dressing tasks due to RUE pain but WFL.  Pt reaching for cup with RUE for drink.  Pt performed grooming tasks at sink with bil UE.     OT Diagnosis:    OT Problem List:   OT Treatment Interventions:      OT Goals(Current goals can be found in the care plan section)    Visit Information  Last OT Received On: 06/05/13 Assistance Needed: +1 History of Present Illness: Patient is a 47 y/o male admitted with right brachial/radioulnar thrombus now s/p thrombectomy.  PMH positive for CHF with implanted defibrillator.       Prior Functioning     Home Living Family/patient expects to be discharged to:: Private residence Living Arrangements: Spouse/significant other Available Help at Discharge: Family Type of Home: House Home Access: Stairs to enter Secretary/administrator of Steps: 5-6 Home Layout: One level Home Equipment: None Prior Function Level of Independence: Independent Communication Communication: No difficulties Dominant Hand: Right         Vision/Perception     Cognition  Cognition Arousal/Alertness: Awake/alert Behavior During Therapy: WFL for tasks assessed/performed Overall Cognitive Status: Within Functional Limits for tasks assessed    Extremity/Trunk Assessment Upper Extremity Assessment Upper Extremity Assessment: RUE deficits/detail RUE Deficits / Details: AROM WFL.  C/o pain with movement but tolerable. RUE: Unable to fully assess due to pain     Mobility Bed Mobility Bed Mobility: Supine to Sit;Sitting - Scoot to Edge of Bed;Sit to Supine Supine to Sit: 7: Independent Sitting - Scoot to Edge of Bed: 7: Independent Sit to Supine: 7: Independent Transfers Transfers: Sit to Stand;Stand to Sit Sit to Stand: 7: Independent;From bed Stand to Sit: 7: Independent;To bed     Exercise Other Exercises Other Exercises: Educated pt on performing AROM several times daily wihle in hospital and continue using RUE during ADLs.   Balance     End of Session OT - End of Session Activity Tolerance: Patient tolerated treatment well  Patient left: in bed;with call bell/phone within reach  GO    06/05/2013 Cipriano Mile OTR/L Pager [phone] Office  [phone]  Cipriano Mile 06/05/2013, 2:11 PM

## 2013-06-06 LAB — CBC
HCT: 43.5 % (ref 39.0–52.0)
Hemoglobin: 14.4 g/dL (ref 13.0–17.0)
MCH: 31.2 pg (ref 26.0–34.0)
MCHC: 33.1 g/dL (ref 30.0–36.0)
MCV: 94.4 fL (ref 78.0–100.0)
RBC: 4.61 MIL/uL (ref 4.22–5.81)

## 2013-06-06 LAB — HEPARIN LEVEL (UNFRACTIONATED): Heparin Unfractionated: 0.47 IU/mL (ref 0.30–0.70)

## 2013-06-06 MED ORDER — OXYCODONE-ACETAMINOPHEN 5-325 MG PO TABS: 1.0000 | ORAL_TABLET | ORAL | Status: DC | PRN

## 2013-06-06 NOTE — Progress Notes (Signed)
Per Sena Slate to DC pt home. Discharge instructions given to pt -- verbalized understanding and taught back follow-up instructions, wound care, activity levels, home meds, s/s of complications and when to call MD/EMS. Renette Butters, Viona Gilmore

## 2013-06-06 NOTE — Progress Notes (Signed)
Discussed with Dr. Darrick Penna pt's bradycardia and short runs of V-tach--ok to give cardiac meds as long as pt is asymptomatic (which he is). Renette Butters, Viona Gilmore

## 2013-06-06 NOTE — Progress Notes (Signed)
ANTICOAGULATION CONSULT NOTE - Follow Up Consult  Pharmacy Consult for heparin>>warfarin Indication: atrial fibrillation and s/p embolectomy, right arm  No Known Allergies  Patient Measurements: Height: 5\' 7"  (170.2 cm) Weight: 195 lb 15.8 oz (88.9 kg) IBW/kg (Calculated) : 66.1 Heparin Dosing Weight: 88kg  Vital Signs: Temp: 97.4 F (36.3 C) (07/12 0835) Temp src: Oral (07/12 0835) BP: 104/70 mmHg (07/12 0835) Pulse Rate: 52 (07/12 0835)  Labs:  Recent Labs  06/04/13 1253 06/05/13 0430 06/05/13 1741 06/06/13 0915  HGB 15.0 14.4  --  14.4  HCT 43.5 42.5  --  43.5  PLT 259 212  --  207  LABPROT 20.4* 23.0*  --  27.1*  INR 1.80* 2.11*  --  2.62*  HEPARINUNFRC  --  0.14* 0.39 0.47  CREATININE 1.16 1.05  --   --     Estimated Creatinine Clearance: 93.5 ml/min (by C-G formula based on Cr of 1.05).  Assessment: 47 yr old male s/p urgent embolectomy 7/10, h/o Afib. Heparin level at goal this morning and INR is now at target (2.6). No bleeding issues noted or mentioned by patient this morning. Did verify with patient that he takes wafarin 10mg  m/w/f/sun and discussed that his INR was therapeutic and we would resume his home dose of warfarin at discharge.  Goal of Therapy:  INR 2-3 Monitor platelets by anticoagulation protocol: Yes   Plan:  Will stop heparin this morning prior to d/c Resume home dose of warfarin   [name] PharmD., BCPS Clinical Pharmacist Pager [phone] 06/06/2013 10:33 AM

## 2013-06-06 NOTE — Discharge Summary (Signed)
[facility] Discharge Summary   Patient ID:  Parker Johnson MRN: [id number] DOB/AGE: [birth year] 47 y.o.  Admit date: 06/04/2013 Discharge date: 06/06/2013 Date of Surgery: 06/04/2013 Surgeon: Surgeon(s): Nada Libman, MD  Admission Diagnosis: RT ARM PAIN NUMBNESS ischemic hand  Discharge Diagnoses:  RT ARM PAIN NUMBNESS ischemic hand  Secondary Diagnoses: Past Medical History  Diagnosis Date  . Ventricular tachycardia   . AICD (automatic cardioverter/defibrillator) present     [employer] 100  . Tobacco abuse   . Alcohol abuse     hx  . Cardiomyopathy     secondary  . HTN (hypertension)     uncontrolled  . CHF (congestive heart failure)   . Gout     Procedure(s): Upper Extremity Thrombectomy  Discharged Condition: good  HPI:  This is a 47 year old gentleman who awoke this morning around 8:30 and began experiencing numbness in his right arm. He went on to the barbershop and it got worse and he began having pain. This prompted him to come to the hospital. He states that it is cold and numb with increasing pain. It has been this way since 8:30 this morning. There are no relieving factors. The pain is constant.   The patient has a history of congestive heart failure. He has a defibrillator in place and on chronic coumadin therapy. He has been on anticoagulation for his heart failure. He is medically managed for hypertension.    He was taken to the OR 06/04/2013 by Dr. Myra Gianotti for Thrombectomy of right brachial, radial, and ulnar artery with primary closure of the brachial artery.  He has been on coumadin with heparin IV until his INR is stable at 2.5-3.0.  He has Palpable right radial pulse. The forearm compartments are very soft. Numbness and pain are resolved.       Hospital Course:  Parker Johnson is a 47 y.o. male is S/P Right Procedure(s): Upper Extremity Thrombectomy Extubated: POD # 0 Physical exam: Right arm incision healing 1+  radial pulse  Sensation intact and equal to the left upper extremity. Post-op wounds healing well Pt. Ambulating, voiding and taking PO diet without difficulty. Pt pain controlled with PO pain meds. Labs as below Complications:none  Consults:     Significant Diagnostic Studies: CBC Lab Results  Component Value Date   WBC 6.0 06/05/2013   HGB 14.4 06/05/2013   HCT 42.5 06/05/2013   MCV 93.6 06/05/2013   PLT 212 06/05/2013    BMET    Component Value Date/Time   NA 136 06/05/2013 0430   K 3.8 06/05/2013 0430   CL 101 06/05/2013 0430   CO2 30 06/05/2013 0430   GLUCOSE 172* 06/05/2013 0430   BUN 12 06/05/2013 0430   CREATININE 1.05 06/05/2013 0430   CALCIUM 8.5 06/05/2013 0430   GFRNONAA 83* 06/05/2013 0430   GFRAA >90 06/05/2013 0430   COAG Lab Results  Component Value Date   INR 2.11* 06/05/2013   INR 1.80* 06/04/2013   INR 2.15* 04/01/2013     Disposition:  Discharge to :Home Discharge Orders   Future Appointments Provider Department Dept Phone   07/23/2013 8:00 AM [facility] [place]) [phone]   Future Orders Complete By Expires     Call MD for:  redness, tenderness, or signs of infection (pain, swelling, bleeding, redness, odor or green/yellow discharge around incision site)  As directed     Call MD for:  severe or increased pain, loss or  decreased feeling  in affected limb(s)  As directed     Call MD for:  temperature >100.5  As directed     Driving Restrictions  As directed     Comments:      No driving for 2 days    Increase activity slowly  As directed     Comments:      Walk with assistance use walker or cane as needed    Lifting restrictions  As directed     Comments:      No lifting for 6 weeks    May shower   As directed     Resume previous diet  As directed         Medication List         allopurinol 100 MG tablet  Commonly known as:  ZYLOPRIM  Take 1 tablet (100 mg total) by mouth daily.     carvedilol  6.25 MG tablet  Commonly known as:  COREG  Take 6.25 mg by mouth 2 (two) times daily with a meal.     digoxin 0.25 MG tablet  Commonly known as:  LANOXIN  Take 0.25 mg by mouth daily.     furosemide 40 MG tablet  Commonly known as:  LASIX  Take 1 tablet (40 mg total) by mouth 2 (two) times daily.     lisinopril 20 MG tablet  Commonly known as:  PRINIVIL,ZESTRIL  Take 20 mg by mouth daily.     oxyCODONE-acetaminophen 5-325 MG per tablet  Commonly known as:  PERCOCET/ROXICET  Take 1-2 tablets by mouth every 4 (four) hours as needed for pain.     spironolactone 25 MG tablet  Commonly known as:  ALDACTONE  Take 25 mg by mouth daily.     warfarin 10 MG tablet  Commonly known as:  COUMADIN  Take 10 mg by mouth every other day.       Verbal and written Discharge instructions given to the patient. Wound care per Discharge AVS He will continue his normal coumadin dose of 10mg  daily and call Dr. Sharyn Lull Monday to get his INR checked.  He will see Dr. Myra Gianotti in 2 weeks.    SignedMosetta Pigeon 06/06/2013, 9:20 AM

## 2013-06-06 NOTE — Progress Notes (Addendum)
[facility] [place]  Subjective  - Right hand feels better   Objective 125/96 59 97.6 F (36.4 C) (Oral) 19 98%  Intake/Output Summary (Last 24 hours) at 06/06/13 0801 Last data filed at 06/06/13 0300  Gross per 24 hour  Intake   1076 ml  Output   1101 ml  Net    -25 ml   Right arm incision healing 1+ radial pulse  Assessment/Planning: S/p embolectomy right hand well perfused.  Waiting INR>2.5  Parker Johnson 06/06/2013 8:01 AM --  Laboratory Lab Results:  Recent Labs  06/04/13 1253 06/05/13 0430  WBC 5.8 6.0  HGB 15.0 14.4  HCT 43.5 42.5  PLT 259 212   BMET  Recent Labs  06/04/13 1253 06/05/13 0430  NA 139 136  K 3.8 3.8  CL 101 101  CO2 27 30  GLUCOSE 139* 172*  BUN 11 12  CREATININE 1.16 1.05  CALCIUM 9.6 8.5    COAG Lab Results  Component Value Date   INR 2.11* 06/05/2013   INR 1.80* 06/04/2013   INR 2.15* 04/01/2013   No results found for this basename: PTT    Addendum: INR 2.6 will d/c home today  Fabienne Bruns, MD [facility] [place] Office: [phone] Pager: [phone]

## 2013-06-07 NOTE — Discharge Summary (Signed)
I agree with the above  Wells Brabham 

## 2013-06-09 ENCOUNTER — Telehealth: Payer: Self-pay | Admitting: Surgery

## 2013-06-09 NOTE — Telephone Encounter (Signed)
Message copied by Jena Gauss on Tue Jun 09, 2013 11:32 AM ------      Message from: Melene Plan      Created: Mon Jun 08, 2013 10:49 AM                   ----- Message -----         From: Lars Mage, PA-C         Sent: 06/06/2013   9:18 AM           To: Melene Plan, RN            Right upper extremity Brachial artery emboli/thrombosis.  F/U with Dr. Myra Gianotti in 2 weeks He is on coumadin and will call his cardiologist Mon. For INR check. ------

## 2013-06-26 ENCOUNTER — Encounter: Payer: Self-pay | Admitting: Surgery

## 2013-06-29 ENCOUNTER — Ambulatory Visit: Payer: Medicaid Other | Admitting: Surgery

## 2013-07-23 ENCOUNTER — Ambulatory Visit (INDEPENDENT_AMBULATORY_CARE_PROVIDER_SITE_OTHER): Payer: Medicaid Other | Admitting: *Deleted

## 2013-07-23 DIAGNOSIS — I472 Ventricular tachycardia, unspecified: Secondary | ICD-10-CM

## 2013-07-23 DIAGNOSIS — I4729 Other ventricular tachycardia: Secondary | ICD-10-CM

## 2013-07-23 DIAGNOSIS — Z95810 Presence of automatic (implantable) cardiac defibrillator: Secondary | ICD-10-CM

## 2013-07-24 ENCOUNTER — Telehealth: Payer: Self-pay | Admitting: *Deleted

## 2013-07-24 NOTE — Telephone Encounter (Signed)
I called pt inquiring if he received his wireless adapter for home monitoring.  Mr. Tesar said he did receive it but did not attach it because he was temporarily living with his mother. He has recently moved out and states he will set up his monitor by tomorrow (07/25/13).   Transmission was scheduled on 07/23/13.

## 2013-08-14 LAB — REMOTE ICD DEVICE
AL AMPLITUDE: 3.1 mv
ATRIAL PACING ICD: 0 pct
DEV-0020ICD: NEGATIVE
DEVICE MODEL ICD: 143406
RV LEAD AMPLITUDE: 20.5 mv
RV LEAD IMPEDENCE ICD: 499 Ohm

## 2013-09-12 ENCOUNTER — Inpatient Hospital Stay (HOSPITAL_COMMUNITY)
Admission: EM | Admit: 2013-09-12 | Discharge: 2013-09-17 | DRG: 292 | Disposition: A | Discharge status: Home / Self Care | Payer: Medicaid Other | Attending: Cardiology | Admitting: Cardiology

## 2013-09-12 ENCOUNTER — Emergency Department (HOSPITAL_COMMUNITY): Payer: Medicaid Other

## 2013-09-12 ENCOUNTER — Encounter (HOSPITAL_COMMUNITY): Payer: Self-pay | Admitting: Emergency Medicine

## 2013-09-12 DIAGNOSIS — Z7901 Long term (current) use of anticoagulants: Secondary | ICD-10-CM

## 2013-09-12 DIAGNOSIS — I509 Heart failure, unspecified: Secondary | ICD-10-CM | POA: Diagnosis present

## 2013-09-12 DIAGNOSIS — I4729 Other ventricular tachycardia: Secondary | ICD-10-CM | POA: Diagnosis not present

## 2013-09-12 DIAGNOSIS — R0600 Dyspnea, unspecified: Secondary | ICD-10-CM

## 2013-09-12 DIAGNOSIS — I428 Other cardiomyopathies: Secondary | ICD-10-CM | POA: Diagnosis present

## 2013-09-12 DIAGNOSIS — Z79899 Other long term (current) drug therapy: Secondary | ICD-10-CM

## 2013-09-12 DIAGNOSIS — I252 Old myocardial infarction: Secondary | ICD-10-CM

## 2013-09-12 DIAGNOSIS — Z95810 Presence of automatic (implantable) cardiac defibrillator: Secondary | ICD-10-CM

## 2013-09-12 DIAGNOSIS — R040 Epistaxis: Secondary | ICD-10-CM | POA: Diagnosis not present

## 2013-09-12 DIAGNOSIS — I4891 Unspecified atrial fibrillation: Secondary | ICD-10-CM | POA: Diagnosis present

## 2013-09-12 DIAGNOSIS — I5023 Acute on chronic systolic (congestive) heart failure: Principal | ICD-10-CM | POA: Diagnosis present

## 2013-09-12 DIAGNOSIS — M109 Gout, unspecified: Secondary | ICD-10-CM | POA: Diagnosis present

## 2013-09-12 DIAGNOSIS — I10 Essential (primary) hypertension: Secondary | ICD-10-CM | POA: Diagnosis present

## 2013-09-12 DIAGNOSIS — I472 Ventricular tachycardia, unspecified: Secondary | ICD-10-CM | POA: Diagnosis not present

## 2013-09-12 DIAGNOSIS — F17200 Nicotine dependence, unspecified, uncomplicated: Secondary | ICD-10-CM | POA: Diagnosis present

## 2013-09-12 LAB — CBC
Hemoglobin: 15 g/dL (ref 13.0–17.0)
MCH: 31.4 pg (ref 26.0–34.0)
Platelets: 161 10*3/uL (ref 150–400)
RBC: 4.77 MIL/uL (ref 4.22–5.81)
WBC: 6.1 10*3/uL (ref 4.0–10.5)

## 2013-09-12 LAB — CBC WITH DIFFERENTIAL/PLATELET
Eosinophils Absolute: 0 10*3/uL (ref 0.0–0.7)
Eosinophils Relative: 0 % (ref 0–5)
Lymphocytes Relative: 14 % (ref 12–46)
Lymphs Abs: 0.9 10*3/uL (ref 0.7–4.0)
MCH: 31.7 pg (ref 26.0–34.0)
MCV: 88.7 fL (ref 78.0–100.0)
Monocytes Absolute: 0.5 10*3/uL (ref 0.1–1.0)
Monocytes Relative: 8 % (ref 3–12)
Platelets: 163 10*3/uL (ref 150–400)
RBC: 4.51 MIL/uL (ref 4.22–5.81)

## 2013-09-12 LAB — COMPREHENSIVE METABOLIC PANEL
ALT: 20 U/L (ref 0–53)
AST: 30 U/L (ref 0–37)
Albumin: 3.1 g/dL — ABNORMAL LOW (ref 3.5–5.2)
Alkaline Phosphatase: 122 U/L — ABNORMAL HIGH (ref 39–117)
Calcium: 8.5 mg/dL (ref 8.4–10.5)
Chloride: 97 mEq/L (ref 96–112)
Creatinine, Ser: 1.14 mg/dL (ref 0.50–1.35)
GFR calc Af Amer: 88 mL/min — ABNORMAL LOW (ref >90)
GFR calc non Af Amer: 76 mL/min — ABNORMAL LOW (ref >90)
Potassium: 4.5 mEq/L (ref 3.5–5.1)
Sodium: 136 mEq/L (ref 135–145)
Total Bilirubin: 1.7 mg/dL — ABNORMAL HIGH (ref 0.3–1.2)

## 2013-09-12 LAB — URINALYSIS, ROUTINE W REFLEX MICROSCOPIC
Bilirubin Urine: NEGATIVE
Glucose, UA: NEGATIVE mg/dL
Hgb urine dipstick: NEGATIVE
Protein, ur: NEGATIVE mg/dL
Urobilinogen, UA: 1 mg/dL (ref 0.0–1.0)
pH: 5.5 (ref 5.0–8.0)

## 2013-09-12 LAB — POCT I-STAT TROPONIN I
Troponin i, poc: 0.04 ng/mL (ref 0.00–0.08)
Troponin i, poc: 0.03 ng/mL (ref 0.00–0.08)

## 2013-09-12 LAB — PROTIME-INR: INR: 1.4 (ref 0.00–1.49)

## 2013-09-12 LAB — PRO B NATRIURETIC PEPTIDE: Pro B Natriuretic peptide (BNP): 7972 pg/mL — ABNORMAL HIGH (ref 0–125)

## 2013-09-12 MED ORDER — POTASSIUM CHLORIDE CRYS ER 20 MEQ PO TBCR
20.0000 meq | EXTENDED_RELEASE_TABLET | Freq: Two times a day (BID) | ORAL | Status: DC
  Administered 2013-09-13 – 2013-09-17 (×9): 20 meq via ORAL
  Filled 2013-09-12 (×11): qty 1

## 2013-09-12 MED ORDER — ONDANSETRON HCL 4 MG/2ML IJ SOLN: 4.0000 mg | Freq: Four times a day (QID) | INTRAMUSCULAR | Status: DC | PRN

## 2013-09-12 MED ORDER — HEPARIN BOLUS VIA INFUSION
2200.0000 [IU] | Freq: Once | INTRAVENOUS | Status: AC
  Administered 2013-09-12: 2200 [IU] via INTRAVENOUS
  Filled 2013-09-12: qty 2200

## 2013-09-12 MED ORDER — MILRINONE IN DEXTROSE 20 MG/100ML IV SOLN
0.2500 ug/kg/min | INTRAVENOUS | Status: DC
  Administered 2013-09-12 – 2013-09-16 (×6): 0.25 ug/kg/min via INTRAVENOUS
  Filled 2013-09-12 (×7): qty 100

## 2013-09-12 MED ORDER — SODIUM CHLORIDE 0.9 % IJ SOLN: 3.0000 mL | INTRAMUSCULAR | Status: DC | PRN

## 2013-09-12 MED ORDER — LEVALBUTEROL HCL 1.25 MG/0.5ML IN NEBU
1.2500 mg | INHALATION_SOLUTION | Freq: Four times a day (QID) | RESPIRATORY_TRACT | Status: DC
  Administered 2013-09-13 – 2013-09-15 (×11): 1.25 mg via RESPIRATORY_TRACT
  Filled 2013-09-12 (×14): qty 0.5

## 2013-09-12 MED ORDER — HEPARIN (PORCINE) IN NACL 100-0.45 UNIT/ML-% IJ SOLN
1400.0000 [IU]/h | INTRAMUSCULAR | Status: DC
  Administered 2013-09-12: 1100 [IU]/h via INTRAVENOUS
  Administered 2013-09-13: 1300 [IU]/h via INTRAVENOUS
  Filled 2013-09-12 (×3): qty 250

## 2013-09-12 MED ORDER — FUROSEMIDE 10 MG/ML IJ SOLN
40.0000 mg | Freq: Two times a day (BID) | INTRAMUSCULAR | Status: DC
  Administered 2013-09-13 – 2013-09-17 (×9): 40 mg via INTRAVENOUS
  Filled 2013-09-12 (×12): qty 4

## 2013-09-12 MED ORDER — SODIUM CHLORIDE 0.9 % IV SOLN
Freq: Once | INTRAVENOUS | Status: AC
  Administered 2013-09-12: 21:00:00 via INTRAVENOUS

## 2013-09-12 MED ORDER — WARFARIN SODIUM 10 MG PO TABS
10.0000 mg | ORAL_TABLET | Freq: Once | ORAL | Status: AC
  Administered 2013-09-12: 10 mg via ORAL
  Filled 2013-09-12: qty 1

## 2013-09-12 MED ORDER — SODIUM CHLORIDE 0.9 % IV SOLN: 250.0000 mL | INTRAVENOUS | Status: DC | PRN

## 2013-09-12 MED ORDER — ALLOPURINOL 100 MG PO TABS
100.0000 mg | ORAL_TABLET | Freq: Every day | ORAL | Status: DC
  Administered 2013-09-13 – 2013-09-17 (×5): 100 mg via ORAL
  Filled 2013-09-12 (×5): qty 1

## 2013-09-12 MED ORDER — SODIUM CHLORIDE 0.9 % IJ SOLN
3.0000 mL | Freq: Two times a day (BID) | INTRAMUSCULAR | Status: DC
  Administered 2013-09-12 – 2013-09-17 (×8): 3 mL via INTRAVENOUS

## 2013-09-12 MED ORDER — ASPIRIN EC 81 MG PO TBEC
81.0000 mg | DELAYED_RELEASE_TABLET | Freq: Every day | ORAL | Status: DC
  Administered 2013-09-13 – 2013-09-17 (×5): 81 mg via ORAL
  Filled 2013-09-12 (×5): qty 1

## 2013-09-12 MED ORDER — ALBUTEROL SULFATE (5 MG/ML) 0.5% IN NEBU
2.5000 mg | INHALATION_SOLUTION | Freq: Once | RESPIRATORY_TRACT | Status: AC
  Administered 2013-09-12: 2.5 mg via RESPIRATORY_TRACT
  Filled 2013-09-12: qty 0.5

## 2013-09-12 MED ORDER — DIGOXIN 250 MCG PO TABS: 0.2500 mg | ORAL_TABLET | Freq: Every day | ORAL | Status: DC

## 2013-09-12 MED ORDER — DIGOXIN 0.25 MG/ML IJ SOLN
0.1250 mg | Freq: Every day | INTRAMUSCULAR | Status: DC
  Administered 2013-09-13 – 2013-09-17 (×5): 0.125 mg via INTRAVENOUS
  Filled 2013-09-12 (×7): qty 0.5

## 2013-09-12 MED ORDER — WARFARIN - PHARMACIST DOSING INPATIENT
Freq: Every day | Status: DC
  Administered 2013-09-13: 1

## 2013-09-12 MED ORDER — FUROSEMIDE 10 MG/ML IJ SOLN
40.0000 mg | Freq: Once | INTRAMUSCULAR | Status: AC
  Administered 2013-09-12: 40 mg via INTRAVENOUS
  Filled 2013-09-12: qty 4

## 2013-09-12 MED ORDER — ACETAMINOPHEN 325 MG PO TABS
650.0000 mg | ORAL_TABLET | ORAL | Status: DC | PRN
  Administered 2013-09-13 – 2013-09-17 (×6): 650 mg via ORAL
  Filled 2013-09-12 (×6): qty 2

## 2013-09-12 NOTE — ED Notes (Signed)
Pt c/o SOB x 3 days; pt sts having URI sx and some fluid build up; pt tachyonic and hypotensive at present

## 2013-09-12 NOTE — ED Provider Notes (Signed)
CSN: [id number]     Arrival date & time 09/12/13  1740 History   First MD Initiated Contact with Patient 09/12/13 1759     Chief Complaint  Patient presents with  . Shortness of Breath   (Consider location/radiation/quality/duration/timing/severity/associated sxs/prior Treatment) Patient is a 47 y.o. male presenting with shortness of breath.  Shortness of Breath Severity:  Severe Onset quality:  Gradual Duration:  3 days Timing:  Constant Progression:  Worsening Chronicity:  Recurrent Context: URI   Relieved by:  Nothing Worsened by:  Nothing tried Ineffective treatments:  None tried Associated symptoms: cough   Associated symptoms: no abdominal pain, no chest pain, no fever, no headaches, no rash, no sore throat and no vomiting     Past Medical History  Diagnosis Date  . Ventricular tachycardia   . AICD (automatic cardioverter/defibrillator) present     [employer]  . Tobacco abuse   . Alcohol abuse     hx  . Cardiomyopathy     secondary  . HTN (hypertension)     uncontrolled  . CHF (congestive heart failure)   . Gout    Past Surgical History  Procedure Laterality Date  . Cardiac defibrillator placement  2010    [employer] 100  . Embolectomy Right 06/04/2013    Procedure: Upper Extremity Thrombectomy;  Surgeon: Nada Libman, MD;  Location: [facility] OR;  Service: Vascular;  Laterality: Right;   Family History  Problem Relation Age of Onset  . Cardiomyopathy Father     on a defib  . Diabetes Father    History  Substance Use Topics  . Smoking status: Current Every Day Smoker -- 0.50 packs/day for 30 years    Types: Cigarettes  . Smokeless tobacco: Never Used     Comment: I already have information on quitting"  . Alcohol Use: 0.6 oz/week    1 Cans of beer per week    Review of Systems  Constitutional: Negative for fever and chills.  HENT: Negative for congestion, rhinorrhea and sore throat.   Eyes: Negative for photophobia  and visual disturbance.  Respiratory: Positive for cough and shortness of breath.   Cardiovascular: Negative for chest pain and leg swelling.  Gastrointestinal: Negative for nausea, vomiting, abdominal pain, diarrhea and constipation.  Endocrine: Negative for polydipsia and polyuria.  Genitourinary: Negative for dysuria and hematuria.  Musculoskeletal: Negative for arthralgias and back pain.  Skin: Negative for color change and rash.  Neurological: Negative for dizziness, syncope, light-headedness and headaches.  Hematological: Negative for adenopathy. Does not bruise/bleed easily.  All other systems reviewed and are negative.    Allergies  Review of patient's allergies indicates no known allergies.  Home Medications   No current outpatient prescriptions on file. BP 96/54  Pulse 68  Temp(Src) 98.3 F (36.8 C) (Oral)  Resp 30  Ht 5\' 7"  (1.702 m)  Wt 189 lb 2.5 oz (85.8 kg)  BMI 29.62 kg/m2  SpO2 99% Physical Exam  Vitals reviewed. Constitutional: He is oriented to person, place, and time. He appears well-developed and well-nourished.  HENT:  Head: Normocephalic and atraumatic.  Eyes: Conjunctivae and EOM are normal.  Neck: Normal range of motion. Neck supple.  Cardiovascular: Normal rate, regular rhythm and normal heart sounds.   Pulmonary/Chest: Effort normal. No respiratory distress. He has rales in the right lower field and the left lower field.  Abdominal: He exhibits no distension. There is no tenderness. There is no rebound and no guarding.  Musculoskeletal: Normal range  of motion.       Right lower leg: He exhibits edema.       Left lower leg: He exhibits edema.  Neurological: He is alert and oriented to person, place, and time.  Skin: Skin is warm and dry.    ED Course  Procedures (including critical care time) Labs Review Labs Reviewed  CBC - Abnormal; Notable for the following:    RDW 18.0 (*)    All other components within normal limits  PRO B  NATRIURETIC PEPTIDE - Abnormal; Notable for the following:    Pro B Natriuretic peptide (BNP) 7972.0 (*)    All other components within normal limits  COMPREHENSIVE METABOLIC PANEL - Abnormal; Notable for the following:    Glucose, Bld 121 (*)    Albumin 3.1 (*)    Alkaline Phosphatase 122 (*)    Total Bilirubin 1.7 (*)    GFR calc non Af Amer 76 (*)    GFR calc Af Amer 88 (*)    All other components within normal limits  PROTIME-INR - Abnormal; Notable for the following:    Prothrombin Time 16.8 (*)    All other components within normal limits  MRSA PCR SCREENING  URINALYSIS, ROUTINE W REFLEX MICROSCOPIC  BASIC METABOLIC PANEL  CBC WITH DIFFERENTIAL  COMPREHENSIVE METABOLIC PANEL  APTT  MAGNESIUM  PRO B NATRIURETIC PEPTIDE  DIGOXIN LEVEL  TSH  TROPONIN I  TROPONIN I  TROPONIN I  HEPARIN LEVEL (UNFRACTIONATED)  CBC  PROTIME-INR  POCT I-STAT TROPONIN I  POCT I-STAT TROPONIN I   Imaging Review Dg Chest 2 View  09/12/2013   CLINICAL DATA:  Short of breath  EXAM: CHEST  2 VIEW  COMPARISON:  Chest radiograph 02/2013  FINDINGS: Left-sided pacemaker with continuous leads overlies stable enlarged cardiac silhouette. There is interval increase in perihilar diffuse airspace disease suggesting pulmonary edema. Small effusions are noted. No focal consolidation. No pneumothorax. .  IMPRESSION: Cardiomegaly and worsening pulmonary edema.   Electronically Signed   By: Genevive Bi M.D.   On: 09/12/2013 19:50    EKG Interpretation     Ventricular Rate:  74 PR Interval:  146 QRS Duration: 78 QT Interval:  376 QTC Calculation: 417 R Axis:   -92 Text Interpretation:  Normal sinus rhythm Biatrial enlargement Right superior axis deviation Anterior infarct , age undetermined Abnormal ECG Poor data quality No significant change since last tracing            MDM   1. CHF exacerbation   2. Dyspnea    47 y.o. male  with pertinent PMH of NICM, CHF, HTN, sp AICD presents with  shortness of breath x3 days consistent with prior CHF exacerbations. Patient denies known eliciting factors including medication noncompliance, however has had URI symptoms 2-3 days prior to onset of dyspnea. Physical exam as above consistent with CHF exacerbation, and labs with imaging indicate CHF exacerbation as etiology of symptoms. Doubt ACS given lack of pain and no coronary artery disease history.  Could not give patient nitroglycerin secondary to relative hypotension, however after albuterol patient had some relief and dyspnea. Unfortunately during the course of care patient became increasingly dyspneic and was placed on BiPAP. Patient given 40 mg IV Lasix and cardiology consult for admission. Patient admitted in stable condition.    Labs and imaging as above reviewed by myself and attending,Dr. Lynelle Doctor, with whom case was discussed.   1. CHF exacerbation   2. Dyspnea  Noel Gerold, MD 09/12/13 2351

## 2013-09-12 NOTE — ED Notes (Signed)
After approval from ED Resident, pt placed on 2lpm O2 via [place]

## 2013-09-12 NOTE — Progress Notes (Signed)
Notified MD that patient had 10 beat run of ventricular tachycardia, was asymptomatic, bp currently reading 95/54.  Will continue to monitor patient.

## 2013-09-12 NOTE — H&P (Signed)
Parker Johnson is an 47 y.o. male.   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 fib, history of alcohol tobacco and polysubstance abuse in the past, came to the ER complaining of progressive increasing shortness of breath for last 2-3 days associated with leg swelling associated with feeling weak tired and fatigued. Patient does give history of PND orthopnea and leg swelling. States recently recovering from cold and completed the course of antibiotics. Denies any fever at present denies any chills denies any sore throat. Denies any chest pain nausea vomiting diaphoresis. Denies any palpitation lightheadedness or syncope. Denies any ICD shocks. Denies any dietary indiscretion. Denies noncompliance to medication. In ER patient was noted to be hypotensive received 40 mg IV Lasix with minimal diuresis and was placed on BiPAP.  Past Medical History  Diagnosis Date  . Ventricular tachycardia   . AICD (automatic cardioverter/defibrillator) present     [employer]  . Tobacco abuse   . Alcohol abuse     hx  . Cardiomyopathy     secondary  . HTN (hypertension)     uncontrolled  . CHF (congestive heart failure)   . Gout     Past Surgical History  Procedure Laterality Date  . Cardiac defibrillator placement  2010    [employer]  . Embolectomy Right 06/04/2013    Procedure: Upper Extremity Thrombectomy;  Surgeon: Nada Libman, MD;  Location: [facility] OR;  Service: Vascular;  Laterality: Right;    Family History   Problem Relation Age of Onset  . Cardiomyopathy Father     on a defib  . Diabetes Father    Social History:  reports that he has been smoking Cigarettes.  He has a 15 pack-year smoking history. He has never used smokeless tobacco. He reports that he drinks about 0.6 ounces of alcohol per week. He reports that he does not use illicit drugs.  Allergies: No Known Allergies   (Not in a hospital admission)  Results for orders placed during the hospital encounter of 09/12/13 (from the past 48 hour(s))  CBC     Status: Abnormal   Collection Time    09/12/13  6:25 PM      Result Value Range   WBC 6.1  4.0 - 10.5 K/uL   RBC 4.77  4.22 - 5.81 MIL/uL   Hemoglobin 15.0  13.0 - 17.0 g/dL   HCT 16.1  09.6 - 04.5 %   MCV 89.9  78.0 - 100.0 fL   MCH 31.4  26.0 - 34.0 pg   MCHC 35.0  30.0 - 36.0 g/dL   RDW 40.9 (*) 81.1 - 91.4 %   Platelets 161  150 - 400 K/uL  PRO B NATRIURETIC PEPTIDE     Status: Abnormal   Collection Time    09/12/13  6:25 PM      Result Value Range   Pro B Natriuretic peptide (BNP) 7972.0 (*) 0 - 125 pg/mL  COMPREHENSIVE METABOLIC PANEL     Status: Abnormal   Collection Time    09/12/13  6:25 PM      Result Value Range   Sodium  136  135 - 145 mEq/L   Potassium 4.5  3.5 - 5.1 mEq/L   Chloride 97  96 - 112 mEq/L   CO2 28  19 - 32 mEq/L   Glucose, Bld 121 (*) 70 - 99 mg/dL   BUN 21  6 - 23 mg/dL   Creatinine, Ser 3.08  0.50 - 1.35 mg/dL   Calcium 8.5  8.4 - 65.7 mg/dL   Total Protein 7.3  6.0 - 8.3 g/dL   Albumin 3.1 (*) 3.5 - 5.2 g/dL   AST 30  0 - 37 U/L   ALT 20  0 - 53 U/L   Alkaline Phosphatase 122 (*) 39 - 117 U/L   Total Bilirubin 1.7 (*) 0.3 - 1.2 mg/dL   GFR calc non Af Amer 76 (*) >90 mL/min   GFR calc Af Amer 88 (*) >90 mL/min   Comment: (NOTE)     The eGFR has been calculated using the CKD EPI equation.     This calculation has not been validated in all clinical situations.     eGFR's persistently <90 mL/min signify possible Chronic Kidney      Disease.  POCT I-STAT TROPONIN I     Status: None   Collection Time    09/12/13  6:36 PM      Result Value Range   Troponin i, poc 0.04  0.00 - 0.08 ng/mL   Comment 3            Comment: Due to the release kinetics of cTnI,     a negative result within the first hours     of the onset of symptoms does not rule out     myocardial infarction with certainty.     If myocardial infarction is still suspected,     repeat the test at appropriate intervals.  URINALYSIS, ROUTINE W REFLEX MICROSCOPIC     Status: None   Collection Time    09/12/13  7:50 PM      Result Value Range   Color, Urine YELLOW  YELLOW   APPearance CLEAR  CLEAR   Specific Gravity, Urine 1.007  1.005 - 1.030   pH 5.5  5.0 - 8.0   Glucose, UA NEGATIVE  NEGATIVE mg/dL   Hgb urine dipstick NEGATIVE  NEGATIVE   Bilirubin Urine NEGATIVE  NEGATIVE   Ketones, ur NEGATIVE  NEGATIVE mg/dL   Protein, ur NEGATIVE  NEGATIVE mg/dL   Urobilinogen, UA 1.0  0.0 - 1.0 mg/dL   Nitrite NEGATIVE  NEGATIVE   Leukocytes, UA NEGATIVE  NEGATIVE   Comment: MICROSCOPIC NOT DONE ON URINES WITH NEGATIVE PROTEIN, BLOOD, LEUKOCYTES, NITRITE, OR GLUCOSE <1000 mg/dL.   Dg Chest 2 View  09/12/2013   CLINICAL DATA:  Short of breath  EXAM: CHEST  2 VIEW  COMPARISON:  Chest radiograph 02/2013  FINDINGS: Left-sided pacemaker with continuous leads overlies stable enlarged cardiac silhouette. There is interval increase in perihilar diffuse airspace disease suggesting pulmonary edema. Small effusions are noted. No focal consolidation. No pneumothorax. .  IMPRESSION: Cardiomegaly and worsening pulmonary edema.   Electronically Signed   By: Genevive Bi M.D.   On: 09/12/2013 19:50    Review of Systems  Constitutional: Negative for fever and chills.  Eyes: Negative for double vision and photophobia.  Respiratory: Positive for cough, shortness of breath and wheezing. Negative for hemoptysis and sputum production.   Cardiovascular: Positive for orthopnea,  leg swelling and PND. Negative for chest pain and palpitations.  Gastrointestinal: Negative for  nausea, vomiting, abdominal pain and diarrhea.  Genitourinary: Negative for dysuria and urgency.  Neurological: Negative for dizziness, tingling and headaches.    Blood pressure 88/47, pulse 73, temperature 98.5 F (36.9 C), temperature source Oral, resp. rate 42, height 5\' 7"  (1.702 m), weight 86.183 kg (190 lb), SpO2 100.00%. Physical Exam  Constitutional: He is oriented to person, place, and time.  Alert awake on BiPAP in mild respiratory distress  HENT:  Head: Normocephalic and atraumatic.  Eyes: Conjunctivae are normal. Left eye exhibits no discharge. No scleral icterus.  Neck: Normal range of motion. Neck supple. JVD present.  Cardiovascular: Normal rate and regular rhythm.   Murmur (Soft systolic murmur and S3 gallop noted) heard. Respiratory:  Decreased breath sound at bases with bibasilar rales and expiratory wheezing noted  GI: Soft. Bowel sounds are normal. He exhibits no distension. There is no tenderness. There is no rebound.  Musculoskeletal:  No clubbing cyanosis 1+ edema noted  Lymphadenopathy:    He has no cervical adenopathy.  Neurological: He is alert and oriented to person, place, and time.     Assessment/Plan Decompensated systolic heart failure precipitated by possible recent URI rule out ischemia Severe nonischemic dilated cardiomyopathy status post ICD History of recurrent congestive heart failure secondary to systolic dysfunction Hypertension History of nonsustained VT History of paroxysmal A. fib History of cardiogenic non-Q-wave myocardial infarction in the past History of cardiogenic emboli to right arm status post thrombectomy in July of 2014 History of alcohol abuse History of tobacco abuse  History of polysubstance abuse Plan As per orders Plan  Tane Biegler N 09/12/2013, 8:40 PM

## 2013-09-12 NOTE — Progress Notes (Addendum)
ANTICOAGULATION CONSULT NOTE - Initial Consult  Pharmacy Consult for heparin/coumadin Indication: h/o cardiogenic emboli, afib  No Known Allergies  Patient Measurements: Height: 5\' 7"  (170.2 cm) Weight: 189 lb 2.5 oz (85.8 kg) IBW/kg (Calculated) : 66.1 Heparin Dosing Weight: 73 kg  Vital Signs: Temp: 98.3 F (36.8 C) (10/18 2200) Temp src: Oral (10/18 1756) BP: 102/62 mmHg (10/18 2200) Pulse Rate: 73 (10/18 2200)  Labs:  Recent Labs  09/12/13 1825 09/12/13 2025  HGB 15.0  --   HCT 42.9  --   PLT 161  --   LABPROT  --  16.8*  INR  --  1.40  CREATININE 1.14  --     Estimated Creatinine Clearance: 84.7 ml/min (by C-G formula based on Cr of 1.14).   Medical History: Past Medical History  Diagnosis Date  . Ventricular tachycardia   . AICD (automatic cardioverter/defibrillator) present     [employer]  . Tobacco abuse   . Alcohol abuse     hx  . Cardiomyopathy     secondary  . HTN (hypertension)     uncontrolled  . CHF (congestive heart failure)   . Gout     Medications:  Prescriptions prior to admission  Medication Sig Dispense Refill  . allopurinol (ZYLOPRIM) 100 MG tablet Take 100 mg by mouth daily.      . carvedilol (COREG) 6.25 MG tablet Take 6.25 mg by mouth 2 (two) times daily with a meal.      . digoxin (LANOXIN) 0.25 MG tablet Take 0.25 mg by mouth daily.      . furosemide (LASIX) 40 MG tablet Take 40 mg by mouth 2 (two) times daily.      Marland Kitchen lisinopril (PRINIVIL,ZESTRIL) 20 MG tablet Take 20 mg by mouth daily.        Marland Kitchen spironolactone (ALDACTONE) 25 MG tablet Take 25 mg by mouth daily.        Marland Kitchen warfarin (COUMADIN) 10 MG tablet Take 10 mg by mouth See admin instructions. Takes 10mg  on Sunday, Monday, Wednesday, and Friday. Does not take at all on the other days        Assessment: 47 yo man on coumadin PTA to start heparin and continue coumadin. He has a h/o cardiogenic emboli and afib.  Admit INR 1.40 Last dose coumadin 10/17 Goal  of Therapy:  Heparin level 0.3-0.7 units/ml Monitor platelets by anticoagulation protocol: Yes   Plan:  Heparin bolus 2200 units and drip at 1100 units/hr Coumadin 10 mg po tonight Check heparin level 6 hours after start and daily while on heparin. Daily CBC and PT/INR  Kaelani Kendrick Poteet 09/12/2013,10:59 PM  Addum:  HL <0.1 units/ml  No issues with infusion per RN.  Will rebolus with 1500 units and inc drip to 1300 units/hr.  Coumadin 7.5 mg today at 18:00.  Check HL 6 hours after rate change.

## 2013-09-12 NOTE — ED Provider Notes (Signed)
Pt presents to the ED with worsening shortness of breath.    On exam rales and wheezes with accessory muscle usage.  BP remains borderline low.    Plan to start bipap, IV lasix.  Will contact Dr Sharyn Lull regarding admission for further treatment.  EKG Interpretation     Ventricular Rate:  74 PR Interval:  146 QRS Duration: 78 QT Interval:  376 QTC Calculation: 417 R Axis:   -92 Text Interpretation:  Normal sinus rhythm Biatrial enlargement Right superior axis deviation Anterior infarct , age undetermined Abnormal ECG Poor data quality No significant change since last tracing           I saw and evaluated the patient, reviewed the resident's note and I agree with the findings and plan.   Celene Kras, MD 09/12/13 2004

## 2013-09-13 ENCOUNTER — Encounter (HOSPITAL_COMMUNITY): Payer: Self-pay | Admitting: *Deleted

## 2013-09-13 LAB — BASIC METABOLIC PANEL
BUN: 21 mg/dL (ref 6–23)
CO2: 27 mEq/L (ref 19–32)
Calcium: 8.3 mg/dL — ABNORMAL LOW (ref 8.4–10.5)
GFR calc non Af Amer: 72 mL/min — ABNORMAL LOW (ref >90)
Glucose, Bld: 97 mg/dL (ref 70–99)
Sodium: 136 mEq/L (ref 135–145)

## 2013-09-13 LAB — COMPREHENSIVE METABOLIC PANEL
BUN: 22 mg/dL (ref 6–23)
Calcium: 7.9 mg/dL — ABNORMAL LOW (ref 8.4–10.5)
Creatinine, Ser: 1.17 mg/dL (ref 0.50–1.35)
GFR calc Af Amer: 85 mL/min — ABNORMAL LOW (ref >90)
Glucose, Bld: 124 mg/dL — ABNORMAL HIGH (ref 70–99)
Total Protein: 6.2 g/dL (ref 6.0–8.3)

## 2013-09-13 LAB — DIGOXIN LEVEL: Digoxin Level: 1.1 ng/mL (ref 0.8–2.0)

## 2013-09-13 LAB — PROTIME-INR: Prothrombin Time: 16.5 seconds — ABNORMAL HIGH (ref 11.6–15.2)

## 2013-09-13 LAB — TSH: TSH: 2.094 u[IU]/mL (ref 0.350–4.500)

## 2013-09-13 LAB — CBC
HCT: 41 % (ref 39.0–52.0)
Hemoglobin: 13.6 g/dL (ref 13.0–17.0)
MCH: 30.1 pg (ref 26.0–34.0)
MCHC: 33.2 g/dL (ref 30.0–36.0)
MCV: 90.7 fL (ref 78.0–100.0)
Platelets: 150 10*3/uL (ref 150–400)
RBC: 4.52 MIL/uL (ref 4.22–5.81)
RDW: 18 % — ABNORMAL HIGH (ref 11.5–15.5)

## 2013-09-13 LAB — PRO B NATRIURETIC PEPTIDE: Pro B Natriuretic peptide (BNP): 5364 pg/mL — ABNORMAL HIGH (ref 0–125)

## 2013-09-13 LAB — MAGNESIUM: Magnesium: 1.7 mg/dL (ref 1.5–2.5)

## 2013-09-13 LAB — TROPONIN I
Troponin I: <0.3 ng/mL (ref <0.30)
Troponin I: <0.3 ng/mL (ref <0.30)
Troponin I: <0.3 ng/mL (ref <0.30)

## 2013-09-13 LAB — APTT: aPTT: 31 seconds (ref 24–37)

## 2013-09-13 MED ORDER — WARFARIN SODIUM 7.5 MG PO TABS
7.5000 mg | ORAL_TABLET | Freq: Once | ORAL | Status: AC
  Administered 2013-09-13: 7.5 mg via ORAL
  Filled 2013-09-13: qty 1

## 2013-09-13 MED ORDER — HEPARIN BOLUS VIA INFUSION
1500.0000 [IU] | Freq: Once | INTRAVENOUS | Status: AC
  Administered 2013-09-13: 1500 [IU] via INTRAVENOUS
  Filled 2013-09-13: qty 1500

## 2013-09-13 MED ORDER — BIOTENE DRY MOUTH MT LIQD
15.0000 mL | Freq: Two times a day (BID) | OROMUCOSAL | Status: DC
  Administered 2013-09-13 – 2013-09-17 (×5): 15 mL via OROMUCOSAL

## 2013-09-13 NOTE — Progress Notes (Signed)
Subjective:  Patient denies any chest pain states breathing has improved of BiPAP now O2 sats above 95 on nasal cannula. Complaints of cough with whitish yellow phlegm states recently finished course of questionable antibiotic probably Z-Pak.  Objective:  Vital Signs in the last 24 hours: Temp:  [98 F (36.7 C)-101 F (38.3 C)] 98.5 F (36.9 C) (10/19 0802) Pulse Rate:  [68-87] 87 (10/19 0802) Resp:  [22-42] 24 (10/19 0802) BP: (76-111)/(47-73) 109/64 mmHg (10/19 0802) SpO2:  [93 %-100 %] 98 % (10/19 0802) FiO2 (%):  [40 %] 40 % (10/19 0425) Weight:  [84 kg (185 lb 3 oz)-86.183 kg (190 lb)] 84 kg (185 lb 3 oz) (10/19 0500)  Intake/Output from previous day: 10/18 0701 - 10/19 0700 In: 366.5 [P.O.:220; I.V.:146.5] Out: 1350 [Urine:1350] Intake/Output from this shift:    Physical Exam: Neck: JVD - 8 cm above sternal notch, no adenopathy, no carotid bruit and supple, symmetrical, trachea midline Lungs: Decreased breath sound at bases with bibasilar rales Heart: regular rate and rhythm, S1, S2 normal and Soft systolic murmur and S3 gallop noted Abdomen: soft, non-tender; bowel sounds normal; no masses,  no organomegaly Extremities: extremities normal, atraumatic, no cyanosis or edema  Lab Results:  Recent Labs  09/12/13 2330 09/13/13 0420  WBC 5.9 7.0  HGB 14.3 13.6  PLT 163 150    Recent Labs  09/12/13 2330 09/13/13 0420  NA 135 136  K 4.4 4.7  CL 97 97  CO2 28 27  GLUCOSE 124* 97  BUN 22 21  CREATININE 1.17 1.18    Recent Labs  09/12/13 2330 09/13/13 0507  TROPONINI <0.30 <0.30   Hepatic Function Panel  Recent Labs  09/12/13 2330  PROT 6.2  ALBUMIN 2.7*  AST 22  ALT 14  ALKPHOS 107  BILITOT 1.5*   No results found for this basename: CHOL,  in the last 72 hours No results found for this basename: PROTIME,  in the last 72 hours  Imaging: Imaging results have been reviewed  Cardiac Studies:  Assessment/Plan:  Decompensated systolic heart  failure precipitated by possible recent URI rule out ischemia  Severe nonischemic dilated cardiomyopathy status post ICD  History of recurrent congestive heart failure secondary to systolic dysfunction  Hypertension  History of nonsustained VT  History of paroxysmal A. 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   LOS: 1 day    Cairo Lingenfelter N 09/13/2013, 9:35 AM

## 2013-09-13 NOTE — Progress Notes (Addendum)
ANTICOAGULATION CONSULT NOTE -Follow Up Pharmacy Consult for heparin/coumadin Indication: h/o cardiogenic emboli, afib  No Known Allergies  Patient Measurements: Height: 5\' 7"  (170.2 cm) Weight: 185 lb 3 oz (84 kg) IBW/kg (Calculated) : 66.1 Heparin Dosing Weight: 73 kg  Vital Signs: Temp: 98.2 F (36.8 C) (10/19 0600) Temp src: Axillary (10/19 0600) BP: 111/64 mmHg (10/19 0440) Pulse Rate: 81 (10/19 0440)  Labs:  Recent Labs  09/12/13 1825 09/12/13 2025 09/12/13 2330 09/13/13 0420 09/13/13 0507 09/13/13 1215 09/13/13 1356  HGB 15.0  --  14.3 13.6  --   --   --   HCT 42.9  --  40.0 41.0  --   --   --   PLT 161  --  163 150  --   --   --   APTT  --   --  31  --   --   --   --   LABPROT  --  16.8*  --  16.5*  --   --   --   INR  --  1.40  --  1.37  --   --   --   HEPARINUNFRC  --   --   --  <0.10*  --   --  0.42  CREATININE 1.14  --  1.17 1.18  --   --   --   TROPONINI  --   --  <0.30  --  <0.30 <0.30  --     Estimated Creatinine Clearance: 81.1 ml/min (by C-G formula based on Cr of 1.18).   Medical History: Past Medical History  Diagnosis Date  . Ventricular tachycardia   . AICD (automatic cardioverter/defibrillator) present     [employer]  . Tobacco abuse   . Alcohol abuse     hx  . Cardiomyopathy     secondary  . HTN (hypertension)     uncontrolled  . CHF (congestive heart failure)   . Gout     Medications:  Prescriptions prior to admission  Medication Sig Dispense Refill  . allopurinol (ZYLOPRIM) 100 MG tablet Take 100 mg by mouth daily.      . carvedilol (COREG) 6.25 MG tablet Take 6.25 mg by mouth 2 (two) times daily with a meal.      . digoxin (LANOXIN) 0.25 MG tablet Take 0.25 mg by mouth daily.      . furosemide (LASIX) 40 MG tablet Take 40 mg by mouth 2 (two) times daily.      Marland Kitchen lisinopril (PRINIVIL,ZESTRIL) 20 MG tablet Take 20 mg by mouth daily.        Marland Kitchen spironolactone (ALDACTONE) 25 MG tablet Take 25 mg by mouth daily.         Marland Kitchen warfarin (COUMADIN) 10 MG tablet Take 10 mg by mouth See admin instructions. Takes 10mg  on Sunday, Monday, Wednesday, and Friday. Does not take at all on the other days        Assessment: 47 yo man admitted 09/12/2013  With progressive SOB.  Pharmacy consulted to start heparin and warfarin.    PMH:  HF EF 15-10%, CAD, Afib, cardiogenic emboli to right arm s/p thrombectomy 7/14, HTN, NSVT, ICD, alcohol and polysubstance abuse, gout  PTA Warfarin 10 mg Sun, Mon, Wed and Fri, none all other days.  Admit INR 1.40 Last dose coumadin 10/17  Coag:  Afib, cardiogenic emboli:  Heparin and warfarin, follow up levels  CV: Afib, HTN, ICD, HF exacerbation Milrinone@ 0.25, furosemide 40 IV bid,  Digoxin (  IV?), ASA Admit wt 190 lb, today's wt = 185lb  Home medications not ordered:  Coreg 6.25 BID, lisinopril 20 mg daily, spironolactone 25 mg daily  Goal of Therapy:  Heparin level 0.3-0.7 units/ml Monitor platelets by anticoagulation protocol: Yes   Plan:  Continue heparin  Current rate follow up HL this afternoon Coumadin 7.5 mg po tonight Daily HL,  CBC and PT/INR Consider transitioning digoxin to PO and restarting ACEI   Thank you for allowing pharmacy to be a part of this patients care team.  Lovenia Kim Pharm.D., BCPS Clinical Pharmacist 09/13/2013 7:53 AM Pager: [phone] Phone: [phone]   2:57 PM  Heparin level reported at goal, will continue at current rate and check in am. Arseniy Toomey Merrily Brittle

## 2013-09-14 LAB — BASIC METABOLIC PANEL
BUN: 19 mg/dL (ref 6–23)
CO2: 29 mEq/L (ref 19–32)
Calcium: 8.5 mg/dL (ref 8.4–10.5)
Creatinine, Ser: 0.91 mg/dL (ref 0.50–1.35)
GFR calc Af Amer: >90 mL/min (ref >90)
GFR calc non Af Amer: >90 mL/min (ref >90)
Glucose, Bld: 105 mg/dL — ABNORMAL HIGH (ref 70–99)
Potassium: 4.3 mEq/L (ref 3.5–5.1)

## 2013-09-14 LAB — CBC
HCT: 42 % (ref 39.0–52.0)
MCH: 29.7 pg (ref 26.0–34.0)
MCHC: 32.9 g/dL (ref 30.0–36.0)
MCV: 90.5 fL (ref 78.0–100.0)
Platelets: 149 10*3/uL — ABNORMAL LOW (ref 150–400)
RBC: 4.64 MIL/uL (ref 4.22–5.81)
RDW: 17.6 % — ABNORMAL HIGH (ref 11.5–15.5)
WBC: 5.2 10*3/uL (ref 4.0–10.5)

## 2013-09-14 LAB — HEPARIN LEVEL (UNFRACTIONATED): Heparin Unfractionated: 0.27 IU/mL — ABNORMAL LOW (ref 0.30–0.70)

## 2013-09-14 MED ORDER — HEPARIN (PORCINE) IN NACL 100-0.45 UNIT/ML-% IJ SOLN
1550.0000 [IU]/h | INTRAMUSCULAR | Status: DC
  Administered 2013-09-14 – 2013-09-15 (×3): 1550 [IU]/h via INTRAVENOUS
  Filled 2013-09-14 (×7): qty 250

## 2013-09-14 MED ORDER — WARFARIN SODIUM 10 MG PO TABS
10.0000 mg | ORAL_TABLET | Freq: Once | ORAL | Status: AC
  Administered 2013-09-14: 10 mg via ORAL
  Filled 2013-09-14: qty 1

## 2013-09-14 NOTE — Progress Notes (Signed)
Subjective:  Patient denies any chest pain states breathing is gradually improving. Complains of occasional epistaxis  . Tolerating IV milrinone  Objective:  Vital Signs in the last 24 hours: Temp:  [97.4 F (36.3 C)-98.8 F (37.1 C)] 97.9 F (36.6 C) (10/20 1100) Pulse Rate:  [68-87] 76 (10/20 1100) Resp:  [20-25] 20 (10/20 1100) BP: (97-124)/(56-77) 117/77 mmHg (10/20 1100) SpO2:  [93 %-99 %] 93 % (10/20 1100) Weight:  [84.4 kg (186 lb 1.1 oz)] 84.4 kg (186 lb 1.1 oz) (10/20 0500)  Intake/Output from previous day: 10/19 0701 - 10/20 0700 In: 929.5 [P.O.:480; I.V.:449.5] Out: 3500 [Urine:3500] Intake/Output from this shift: Total I/O In: 522.5 [P.O.:420; I.V.:102.5] Out: 900 [Urine:900]  Physical Exam: Neck: no adenopathy, no carotid bruit, no JVD and supple, symmetrical, trachea midline Lungs: Decreased breath sound at bases with faint rales air entry improved Heart: regular rate and rhythm, S1, S2 normal and Soft systolic murmur and S3 gallop noted Abdomen: soft, non-tender; bowel sounds normal; no masses,  no organomegaly Extremities: extremities normal, atraumatic, no cyanosis or edema  Lab Results:  Recent Labs  09/13/13 0420 09/14/13 0331  WBC 7.0 5.2  HGB 13.6 13.8  PLT 150 149*    Recent Labs  09/13/13 0420 09/14/13 0331  NA 136 133*  K 4.7 4.3  CL 97 95*  CO2 27 29  GLUCOSE 97 105*  BUN 21 19  CREATININE 1.18 0.91    Recent Labs  09/13/13 0507 09/13/13 1215  TROPONINI <0.30 <0.30   Hepatic Function Panel  Recent Labs  09/12/13 2330  PROT 6.2  ALBUMIN 2.7*  AST 22  ALT 14  ALKPHOS 107  BILITOT 1.5*   No results found for this basename: CHOL,  in the last 72 hours No results found for this basename: PROTIME,  in the last 72 hours  Imaging: Imaging results have been reviewed and Dg Chest 2 View  09/12/2013   CLINICAL DATA:  Short of breath  EXAM: CHEST  2 VIEW  COMPARISON:  Chest radiograph 02/2013  FINDINGS: Left-sided pacemaker  with continuous leads overlies stable enlarged cardiac silhouette. There is interval increase in perihilar diffuse airspace disease suggesting pulmonary edema. Small effusions are noted. No focal consolidation. No pneumothorax. .  IMPRESSION: Cardiomegaly and worsening pulmonary edema.   Electronically Signed   By: Genevive Bi M.D.   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  LOS: 2 days    Everli Rother N 09/14/2013, 1:05 PM

## 2013-09-14 NOTE — Progress Notes (Addendum)
ANTICOAGULATION CONSULT NOTE - Initial Consult  Pharmacy Consult for heparin/warfarin  Indication: h/o cardiogenic emboli, afib  No Known Allergies  Patient Measurements: Height: 5\' 7"  (170.2 cm) Weight: 186 lb 1.1 oz (84.4 kg) IBW/kg (Calculated) : 66.1 Heparin Dosing Weight: 73 kg  Vital Signs: Temp: 97.4 F (36.3 C) (10/20 0816) Temp src: Oral (10/20 0816) BP: 121/64 mmHg (10/20 0816) Pulse Rate: 77 (10/20 0816)  Labs:  Recent Labs  09/12/13 2025 09/12/13 2330 09/13/13 0420 09/13/13 0507 09/13/13 1215 09/13/13 1356 09/14/13 0331  HGB  --  14.3 13.6  --   --   --  13.8  HCT  --  40.0 41.0  --   --   --  42.0  PLT  --  163 150  --   --   --  149*  APTT  --  31  --   --   --   --   --   LABPROT 16.8*  --  16.5*  --   --   --  17.5*  INR 1.40  --  1.37  --   --   --  1.48  HEPARINUNFRC  --   --  <0.10*  --   --  0.42 0.28*  CREATININE  --  1.17 1.18  --   --   --  0.91  TROPONINI  --  <0.30  --  <0.30 <0.30  --   --     Estimated Creatinine Clearance: 105.3 ml/min (by C-G formula based on Cr of 0.91).   Medical History: Past Medical History  Diagnosis Date  . Ventricular tachycardia   . AICD (automatic cardioverter/defibrillator) present     [employer]  . Tobacco abuse   . Alcohol abuse     hx  . Cardiomyopathy     secondary  . HTN (hypertension)     uncontrolled  . CHF (congestive heart failure)   . Gout     Medications:  Prescriptions prior to admission  Medication Sig Dispense Refill  . allopurinol (ZYLOPRIM) 100 MG tablet Take 100 mg by mouth daily.      . carvedilol (COREG) 6.25 MG tablet Take 6.25 mg by mouth 2 (two) times daily with a meal.      . digoxin (LANOXIN) 0.25 MG tablet Take 0.25 mg by mouth daily.      . furosemide (LASIX) 40 MG tablet Take 40 mg by mouth 2 (two) times daily.      Marland Kitchen lisinopril (PRINIVIL,ZESTRIL) 20 MG tablet Take 20 mg by mouth daily.        Marland Kitchen spironolactone (ALDACTONE) 25 MG tablet Take 25 mg  by mouth daily.        Marland Kitchen warfarin (COUMADIN) 10 MG tablet Take 10 mg by mouth See admin instructions. Takes 10mg  on Sunday, Monday, Wednesday, and Friday. Does not take at all on the other days        Assessment: 47 yo man on coumadin PTA to continue heparin and warfarin bridge. He has a h/o cardiogenic emboli and afib. Heparin level this am slightly less than goal, rate adjusted. INR continues to be below goal at 1.4. No bleeding issues have been noted, cbc remains stable.   Goal of Therapy:  INR goal 2-3 Heparin level 0.3-0.7 units/ml Monitor platelets by anticoagulation protocol: Yes   Plan:  Increase heparin drip to 1400 units/hr Recheck heparin level this afternoon Warfarin 10mg  tonight Continue INR daily for now  [name] PharmD., BCPS Clinical Pharmacist  Pager [phone] 09/14/2013 8:42 AM  Repeat heparin level 0.27? Will increase rate to 1550/hr and recheck level in am. 09/14/2013 1:45 PM

## 2013-09-14 NOTE — Progress Notes (Signed)
Periodic runs vTach. Pt remain asymptomatic. Tolerating increased activity today: ADLs, OOB inchair and short walk in unit. Sats >90% ono 2L n/c

## 2013-09-14 NOTE — Progress Notes (Signed)
ANTICOAGULATION CONSULT NOTE - Initial Consult  Pharmacy Consult for heparin Indication: h/o cardiogenic emboli, afib  No Known Allergies  Patient Measurements: Height: 5\' 7"  (170.2 cm) Weight: 185 lb 3 oz (84 kg) IBW/kg (Calculated) : 66.1 Heparin Dosing Weight: 73 kg  Vital Signs: Temp: 98.5 F (36.9 C) (10/20 0432) Temp src: Oral (10/20 0432) BP: 114/63 mmHg (10/20 0432) Pulse Rate: 85 (10/20 0432)  Labs:  Recent Labs  09/12/13 2025 09/12/13 2330 09/13/13 0420 09/13/13 0507 09/13/13 1215 09/13/13 1356 09/14/13 0331  HGB  --  14.3 13.6  --   --   --  13.8  HCT  --  40.0 41.0  --   --   --  42.0  PLT  --  163 150  --   --   --  149*  APTT  --  31  --   --   --   --   --   LABPROT 16.8*  --  16.5*  --   --   --  17.5*  INR 1.40  --  1.37  --   --   --  1.48  HEPARINUNFRC  --   --  <0.10*  --   --  0.42 0.28*  CREATININE  --  1.17 1.18  --   --   --  0.91  TROPONINI  --  <0.30  --  <0.30 <0.30  --   --     Estimated Creatinine Clearance: 105.2 ml/min (by C-G formula based on Cr of 0.91).   Medical History: Past Medical History  Diagnosis Date  . Ventricular tachycardia   . AICD (automatic cardioverter/defibrillator) present     [employer] 100  . Tobacco abuse   . Alcohol abuse     hx  . Cardiomyopathy     secondary  . HTN (hypertension)     uncontrolled  . CHF (congestive heart failure)   . Gout     Medications:  Prescriptions prior to admission  Medication Sig Dispense Refill  . allopurinol (ZYLOPRIM) 100 MG tablet Take 100 mg by mouth daily.      . carvedilol (COREG) 6.25 MG tablet Take 6.25 mg by mouth 2 (two) times daily with a meal.      . digoxin (LANOXIN) 0.25 MG tablet Take 0.25 mg by mouth daily.      . furosemide (LASIX) 40 MG tablet Take 40 mg by mouth 2 (two) times daily.      Marland Kitchen lisinopril (PRINIVIL,ZESTRIL) 20 MG tablet Take 20 mg by mouth daily.        Marland Kitchen spironolactone (ALDACTONE) 25 MG tablet Take 25 mg by mouth daily.         Marland Kitchen warfarin (COUMADIN) 10 MG tablet Take 10 mg by mouth See admin instructions. Takes 10mg  on Sunday, Monday, Wednesday, and Friday. Does not take at all on the other days        Assessment: 47 yo man on coumadin PTA to continue heparin. He has a h/o cardiogenic emboli and afib. Heparin level this am slightly less than goal. Goal of Therapy:  Heparin level 0.3-0.7 units/ml Monitor platelets by anticoagulation protocol: Yes   Plan:  Increase heparin drip to 1400 units/hr  Jolaine Fryberger Poteet 09/14/2013,4:53 AM

## 2013-09-15 LAB — CBC
HCT: 43.3 % (ref 39.0–52.0)
MCH: 31.2 pg (ref 26.0–34.0)
MCV: 90.6 fL (ref 78.0–100.0)
RBC: 4.78 MIL/uL (ref 4.22–5.81)
RDW: 17.1 % — ABNORMAL HIGH (ref 11.5–15.5)
WBC: 5.2 10*3/uL (ref 4.0–10.5)

## 2013-09-15 LAB — BASIC METABOLIC PANEL
CO2: 31 mEq/L (ref 19–32)
Chloride: 98 mEq/L (ref 96–112)
Creatinine, Ser: 0.97 mg/dL (ref 0.50–1.35)
Glucose, Bld: 101 mg/dL — ABNORMAL HIGH (ref 70–99)
Potassium: 4.4 mEq/L (ref 3.5–5.1)
Sodium: 137 mEq/L (ref 135–145)

## 2013-09-15 LAB — HEPARIN LEVEL (UNFRACTIONATED): Heparin Unfractionated: 0.37 IU/mL (ref 0.30–0.70)

## 2013-09-15 LAB — PROTIME-INR: INR: 1.66 — ABNORMAL HIGH (ref 0.00–1.49)

## 2013-09-15 LAB — PRO B NATRIURETIC PEPTIDE: Pro B Natriuretic peptide (BNP): 4422 pg/mL — ABNORMAL HIGH (ref 0–125)

## 2013-09-15 MED ORDER — LISINOPRIL 10 MG PO TABS
10.0000 mg | ORAL_TABLET | Freq: Every day | ORAL | Status: DC
  Administered 2013-09-15 – 2013-09-17 (×3): 10 mg via ORAL
  Filled 2013-09-15 (×3): qty 1

## 2013-09-15 MED ORDER — LEVALBUTEROL HCL 1.25 MG/0.5ML IN NEBU
1.2500 mg | INHALATION_SOLUTION | Freq: Four times a day (QID) | RESPIRATORY_TRACT | Status: DC | PRN
  Administered 2013-09-16: 1.25 mg via RESPIRATORY_TRACT
  Filled 2013-09-15: qty 0.5

## 2013-09-15 MED ORDER — WARFARIN SODIUM 10 MG PO TABS
10.0000 mg | ORAL_TABLET | Freq: Once | ORAL | Status: AC
  Administered 2013-09-15: 10 mg via ORAL
  Filled 2013-09-15 (×2): qty 1

## 2013-09-15 NOTE — Progress Notes (Signed)
ANTICOAGULATION CONSULT NOTE - Initial Consult  Pharmacy Consult for heparin/warfarin  Indication: h/o cardiogenic emboli, afib  No Known Allergies  Patient Measurements: Height: 5\' 7"  (170.2 cm) Weight: 186 lb 1.1 oz (84.4 kg) IBW/kg (Calculated) : 66.1 Heparin Dosing Weight: 73 kg  Vital Signs: Temp: 97.8 F (36.6 C) (10/21 0428) Temp src: Oral (10/21 0428) BP: 129/76 mmHg (10/21 0428) Pulse Rate: 97 (10/21 0655)  Labs:  Recent Labs  09/12/13 2330 09/13/13 0420 09/13/13 0507 09/13/13 1215  09/14/13 0331 09/14/13 1120 09/15/13 0405  HGB 14.3 13.6  --   --   --  13.8  --  14.9  HCT 40.0 41.0  --   --   --  42.0  --  43.3  PLT 163 150  --   --   --  149*  --  158  APTT 31  --   --   --   --   --   --   --   LABPROT  --  16.5*  --   --   --  17.5*  --  19.1*  INR  --  1.37  --   --   --  1.48  --  1.66*  HEPARINUNFRC  --  <0.10*  --   --   < > 0.28* 0.27* 0.37  CREATININE 1.17 1.18  --   --   --  0.91  --  0.97  TROPONINI <0.30  --  <0.30 <0.30  --   --   --   --   < > = values in this interval not displayed.  Estimated Creatinine Clearance: 98.8 ml/min (by C-G formula based on Cr of 0.97).  Assessment: 47 yo man on coumadin PTA to continue heparin and warfarin bridge. He has a h/o cardiogenic emboli and afib. Heparin level this am at goal, rate adjusted yesterday. INR continues to be below goal at 1.6 but trending up, will continue with higher dosing tonight. No bleeding issues have been noted, cbc remains stable.   Goal of Therapy:  INR goal 2-3 Heparin level 0.3-0.7 units/ml Monitor platelets by anticoagulation protocol: Yes   Plan:  Continue heparin drip at 1400 units/hr Recheck heparin daily Warfarin 10mg  tonight Continue INR daily for now  [name] PharmD., BCPS Clinical Pharmacist Pager [phone] 09/15/2013 7:51 AM

## 2013-09-15 NOTE — Progress Notes (Signed)
Subjective:  Patient denies any chest pains states breathing has improved. No further epistaxis patient had episodes of nonsustained VT asymptomatic  Objective:  Vital Signs in the last 24 hours: Temp:  [97.5 F (36.4 C)-98 F (36.7 C)] 97.5 F (36.4 C) (10/21 1150) Pulse Rate:  [68-97] 82 (10/21 1150) Resp:  [16-31] 31 (10/21 1150) BP: (104-129)/(54-82) 120/81 mmHg (10/21 1150) SpO2:  [91 %-98 %] 93 % (10/21 1150) Weight:  [84.4 kg (186 lb 1.1 oz)] 84.4 kg (186 lb 1.1 oz) (10/21 0428)  Intake/Output from previous day: 10/20 0701 - 10/21 0700 In: 1261.4 [P.O.:720; I.V.:541.4] Out: 3300 [Urine:3300] Intake/Output from this shift: Total I/O In: 508 [P.O.:420; I.V.:88] Out: 800 [Urine:800]  Physical Exam: Neck: no adenopathy, no carotid bruit, no JVD and supple, symmetrical, trachea midline Lungs: Decreased breath sound at bases with faint rales Heart: regular rate and rhythm, S1, S2 normal and Soft systolic murmur and S3 gallop noted Abdomen: soft, non-tender; bowel sounds normal; no masses,  no organomegaly Extremities: extremities normal, atraumatic, no cyanosis or edema  Lab Results:  Recent Labs  09/14/13 0331 09/15/13 0405  WBC 5.2 5.2  HGB 13.8 14.9  PLT 149* 158    Recent Labs  09/14/13 0331 09/15/13 0405  NA 133* 137  K 4.3 4.4  CL 95* 98  CO2 29 31  GLUCOSE 105* 101*  BUN 19 16  CREATININE 0.91 0.97    Recent Labs  09/13/13 0507 09/13/13 1215  TROPONINI <0.30 <0.30   Hepatic Function Panel  Recent Labs  09/12/13 2330  PROT 6.2  ALBUMIN 2.7*  AST 22  ALT 14  ALKPHOS 107  BILITOT 1.5*   No results found for this basename: CHOL,  in the last 72 hours No results found for this basename: PROTIME,  in the last 72 hours  Imaging: Imaging results have been reviewed and No results found.  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 fib  History of cardiogenic non-Q-wave myocardial infarction in the past  History of cardiogenic emboli to right arm status post thrombectomy in July of 2014  History of alcohol abuse  History of tobacco abuse  History of polysubstance abuse  Plan Continue present management Restart ACE inhibitors as per orders  LOS: 3 days    Eiza Canniff N 09/15/2013, 11:56 AM

## 2013-09-16 LAB — CBC
HCT: 47.7 % (ref 39.0–52.0)
Hemoglobin: 16.7 g/dL (ref 13.0–17.0)
MCH: 31.3 pg (ref 26.0–34.0)
MCHC: 35 g/dL (ref 30.0–36.0)
MCV: 89.5 fL (ref 78.0–100.0)
Platelets: 181 10*3/uL (ref 150–400)
RBC: 5.33 MIL/uL (ref 4.22–5.81)

## 2013-09-16 LAB — PROTIME-INR
INR: 1.9 — ABNORMAL HIGH (ref 0.00–1.49)
Prothrombin Time: 21.2 seconds — ABNORMAL HIGH (ref 11.6–15.2)

## 2013-09-16 LAB — HEPARIN LEVEL (UNFRACTIONATED): Heparin Unfractionated: 0.37 IU/mL (ref 0.30–0.70)

## 2013-09-16 MED ORDER — WARFARIN SODIUM 10 MG PO TABS
10.0000 mg | ORAL_TABLET | Freq: Once | ORAL | Status: AC
  Administered 2013-09-16: 10 mg via ORAL
  Filled 2013-09-16: qty 1

## 2013-09-16 NOTE — Progress Notes (Signed)
ANTICOAGULATION CONSULT NOTE - Initial Consult  Pharmacy Consult for heparin/warfarin  Indication: h/o cardiogenic emboli, afib  No Known Allergies  Patient Measurements: Height: 5\' 7"  (170.2 cm) Weight: 186 lb 1.1 oz (84.4 kg) IBW/kg (Calculated) : 66.1 Heparin Dosing Weight: 73 kg  Vital Signs: Temp: 98.6 F (37 C) (10/22 0855) Temp src: Oral (10/22 0855) BP: 109/72 mmHg (10/22 0855) Pulse Rate: 93 (10/22 0855)  Labs:  Recent Labs  09/13/13 1215  09/14/13 0331 09/14/13 1120 09/15/13 0405 09/16/13 0512  HGB  --   < > 13.8  --  14.9 16.7  HCT  --   --  42.0  --  43.3 47.7  PLT  --   --  149*  --  158 181  LABPROT  --   --  17.5*  --  19.1* 21.2*  INR  --   --  1.48  --  1.66* 1.90*  HEPARINUNFRC  --   < > 0.28* 0.27* 0.37 0.37  CREATININE  --   --  0.91  --  0.97  --   TROPONINI <0.30  --   --   --   --   --   < > = values in this interval not displayed.  Estimated Creatinine Clearance: 98.8 ml/min (by C-G formula based on Cr of 0.97).  Assessment: 47 yo man on coumadin PTA to continue heparin and warfarin bridge. He has a h/o cardiogenic emboli and afib. Heparin level this am at goal. INR just below goal at 1.9 trending up, will continue with higher dosing tonight and likely change to home dose tomorrow. No bleeding issues have been noted, cbc remains stable.   Goal of Therapy:  INR goal 2-3 Heparin level 0.3-0.7 units/ml Monitor platelets by anticoagulation protocol: Yes   Plan:  Continue heparin drip at 1400 units/hr Recheck heparin daily Warfarin 10mg  tonight Continue INR daily for now  [name] PharmD., BCPS Clinical Pharmacist Pager [phone] 09/16/2013 9:56 AM

## 2013-09-16 NOTE — Progress Notes (Signed)
Subjective:  Patient denies any chest pain shortness of breath or palpitation. Eager to go home. Objective:  Vital Signs in the last 24 hours: Temp:  [97.4 F (36.3 C)-98.6 F (37 C)] 97.4 F (36.3 C) (10/22 1140) Pulse Rate:  [85-93] 86 (10/22 1140) Resp:  [14-26] 20 (10/22 1140) BP: (109-128)/(58-80) 112/68 mmHg (10/22 1140) SpO2:  [93 %-98 %] 97 % (10/22 1140) Weight:  [84.4 kg (186 lb 1.1 oz)] 84.4 kg (186 lb 1.1 oz) (10/22 0451)  Intake/Output from previous day: 10/21 0701 - 10/22 0700 In: 1191 [P.O.:660; I.V.:531] Out: 2845 [Urine:2845] Intake/Output from this shift: Total I/O In: 366 [P.O.:300; I.V.:66] Out: 1450 [Urine:1450]  Physical Exam: Neck: no adenopathy, no carotid bruit, no JVD and supple, symmetrical, trachea midline Lungs: Decreased breath sound at bases Heart: regular rate and rhythm, S1, S2 normal and Soft systolic murmur and S3 gallop noted Abdomen: soft, non-tender; bowel sounds normal; no masses,  no organomegaly Extremities: extremities normal, atraumatic, no cyanosis or edema  Lab Results:  Recent Labs  09/15/13 0405 09/16/13 0512  WBC 5.2 5.7  HGB 14.9 16.7  PLT 158 181    Recent Labs  09/14/13 0331 09/15/13 0405  NA 133* 137  K 4.3 4.4  CL 95* 98  CO2 29 31  GLUCOSE 105* 101*  BUN 19 16  CREATININE 0.91 0.97   No results found for this basename: TROPONINI, CK, MB,  in the last 72 hours Hepatic Function Panel No results found for this basename: PROT, ALBUMIN, AST, ALT, ALKPHOS, BILITOT, BILIDIR, IBILI,  in the last 72 hours No results found for this basename: CHOL,  in the last 72 hours No results found for this basename: PROTIME,  in the last 72 hours  Imaging: Imaging results have been reviewed and No results found.  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 Possible discharge tomorrow once INR above 2 and hemodynamically stable  LOS: 4 days    Jazmine Longshore N 09/16/2013, 12:16 PM

## 2013-09-17 LAB — CBC
HCT: 50.8 % (ref 39.0–52.0)
MCH: 30.6 pg (ref 26.0–34.0)
MCV: 89 fL (ref 78.0–100.0)
Platelets: 195 10*3/uL (ref 150–400)
RBC: 5.71 MIL/uL (ref 4.22–5.81)
RDW: 17.2 % — ABNORMAL HIGH (ref 11.5–15.5)
WBC: 5.2 10*3/uL (ref 4.0–10.5)

## 2013-09-17 LAB — BASIC METABOLIC PANEL
BUN: 14 mg/dL (ref 6–23)
Calcium: 9 mg/dL (ref 8.4–10.5)
Chloride: 96 mEq/L (ref 96–112)
Creatinine, Ser: 0.82 mg/dL (ref 0.50–1.35)
GFR calc Af Amer: >90 mL/min (ref >90)
GFR calc non Af Amer: >90 mL/min (ref >90)
Glucose, Bld: 106 mg/dL — ABNORMAL HIGH (ref 70–99)

## 2013-09-17 LAB — PROTIME-INR
INR: 2.18 — ABNORMAL HIGH (ref 0.00–1.49)
Prothrombin Time: 23.6 seconds — ABNORMAL HIGH (ref 11.6–15.2)

## 2013-09-17 LAB — HEPARIN LEVEL (UNFRACTIONATED): Heparin Unfractionated: 0.16 IU/mL — ABNORMAL LOW (ref 0.30–0.70)

## 2013-09-17 MED ORDER — WARFARIN SODIUM 10 MG PO TABS: 10.0000 mg | ORAL_TABLET | Freq: Every day | ORAL | Status: DC

## 2013-09-17 NOTE — Discharge Summary (Signed)
  Discharge summary dictated on 09/17/2013 dictation number is [phone]

## 2013-09-17 NOTE — Care Management Note (Signed)
    Page 1 of 1   09/17/2013     11:55:24 AM   CARE MANAGEMENT NOTE 09/17/2013  Patient:  Parker Johnson, Parker Johnson   Account Number:  [id number]  Date Initiated:  09/14/2013  Documentation initiated by:  Arelyn Gauer  Subjective/Objective Assessment:   adm with dx of systolic failure; lives with spouse    PCP  Dr Sharyn Lull     DC Planning Services  CM consult      HH arranged  HH - 11 Patient Refused      Status of service:  Completed, signed off  Per UR Regulation:  Reviewed for med. necessity/level of care/duration of stay  Comments:  09/17/13 0940 Verdis Prime RN MSN BSN CCM Received referral for heart failure home health screen. Per pt, he is very active and would not be @ home for nursing visits, does not feel he would benefit from home health services.  States that he will ask MD if he feels a need in the future.

## 2013-09-17 NOTE — Discharge Summary (Signed)
NAME:  Parker Johnson, Parker Johnson NO.:  [id number]  MEDICAL RECORD NO.:  [id number]  LOCATION:  2C03C                        FACILITY:  [facility]  PHYSICIAN:  Eduardo Osier. Sharyn Lull, M.D. DATE OF BIRTH:  1966-10-12  DATE OF ADMISSION:  09/12/2013 DATE OF DISCHARGE:                              DISCHARGE SUMMARY   ADMITTING DIAGNOSES: 1. Decompensated systolic heart failure, precipitated by possible     recent upper respiratory illness, rule out ischemia. 2. Severe nonischemic dilated cardiomyopathy, status post implantable     cardioverter-defibrillator in the past. 3. History of recurrent congestive heart failure secondary to systolic     dysfunction. 4. Hypertension. 5. History of paroxysmal, nonsustained ventricular tachycardia. 6. History of paroxysmal atrial fibrillation. 7. History of cardiogenic non-Q-wave myocardial infarction in the     past. 8. History of cardiogenic emboli to the right arm, status post     thrombectomy in July 2014. 9. History of alcohol abuse. 10.History of tobacco abuse. 11.History of polysubstance abuse.  DISCHARGE DIAGNOSES: 1. Compensated systolic heart failure. 2. Severe nonischemic dilated cardiomyopathy, status post implantable     cardioverter-defibrillator in the past. 3. History of recurrent congestive heart failure secondary to systolic     dysfunction. 4. Hypertension. 5. History of nonsustained ventricular tachycardia. 6. History of paroxysmal atrial fibrillation. 7. History of cardiogenic non-Q-wave myocardial infarction in the     past. 8. History of cardiogenic emboli to right arm, status post     thrombectomy in July 2014. 9. History of alcohol abuse. 10.History of tobacco abuse. 11.History of polysubstance abuse.  DISCHARGE HOME MEDICATIONS: 1. Warfarin 10 mg 1 tablet daily. 2. Allopurinol 100 mg 1 tablet daily. 3. Carvedilol 6.25 mg twice daily. 4. Digoxin 0.25 mg 1 tablet daily. 5. Lasix 40 mg 1 tablet twice daily. 6.  Lisinopril 20 mg daily. 7. Spironolactone 25 mg daily.  DIET:  Low salt, low cholesterol.  The patient has been advised to monitor weight daily and restrict fluid to 1 L per 24 hours.  The patient has been advised to check PT/INR early next week.  CONDITION AT DISCHARGE:  Stable.  BRIEF HISTORY AND HOSPITAL COURSE:  Parker Johnson is a 47 year old male with past medical history significant for severe nonischemic dilated cardiomyopathy, EF of approximately 15-20%, history of recurrent congestive heart failure secondary to systolic dysfunction; history of cardiogenic non-Q-wave myocardial infarction in the past, history of cardiogenic emboli to the right arm, status post thrombectomy in July 2014, hypertension, history of nonsustained VT, history of paroxysmal AFib, history of alcohol abuse, tobacco abuse, polysubstance abuse in the past.  He came to the ER complaining of progressive increasing shortness of breath for the last 2-3 days associated with leg swelling and feeling weak, tired, and fatigued.  The patient gives history of PND, orthopnea, or leg swelling.  States recently recovering from cold and has completed a course of antibiotics.  Denies any fever at present. Denies chills.  Denies any sore throat.  Denies any chest pain, nausea, vomiting, diaphoresis.  Denies palpitation, lightheadedness, or syncope. Denies any ICD discharges.  Denies any dietary indiscretion.  Denies noncompliance to medication.  In the ER, the patient was noted  to be hypotensive with 40 mg IV Lasix with minimal diuresis, and in the ER, the patient was noted to be hypoxic with 40 mg of IV Lasix with minimal diuresis and was placed on BiPAP.  PAST MEDICAL HISTORY:  As above.  PHYSICAL EXAMINATION:  VITAL SIGNS:  His blood pressure was 88/47, pulse was 73.  He was afebrile. HEENT:  Conjunctivae was pink. NECK:  Supple.  Positive JVD. LUNGS:  He has decreased breath sounds at bases with bibasilar rales  and expiratory wheezing. CARDIOVASCULAR:  S1, S2.  There was soft systolic murmur and S3 gallop. ABDOMEN:  Soft.  Bowel sounds present.  Nontender. EXTREMITIES:  There was no clubbing, cyanosis.  There was 1+ edema. NEUROLOGIC:  Grossly intact.  LABORATORY DATA:  Sodium was 136, potassium 4.5, BUN 21, creatinine 1.14, glucose was 121.  Repeat fasting sugar was 97.  His albumin was low at 3.1.  His proBNP was 7,972.  Repeat proBNP 5364, 4022, and 4006, which is trending down.  Hemoglobin was 15, hematocrit 42.9, white count of 6.1.  Repeat hemoglobin was on September 15, 2013; 14.9, hematocrit 43.3, white count 5.2.  Today hemoglobin is 17.5, hematocrit 50.8, white count of 5.2.  His sodium today is 133, potassium 4.1, BUN 14, creatinine 0.82, glucose is 106.  His TSH was 2.09.  Dig level was 1.1. His admission PT/INR, PT was 16.8, INR 1.40.  Repeat was 16.5, INR 1.37. On September 15, 2013; 19.1, INR of 1.66 yesterday, PT was 21.2, INR 1.90. Today PT is 23.6, INR of 2.18.  BRIEF HOSPITAL COURSE:  The patient was admitted to step-down unit.  The patient was started on IV milrinone and IV Lasix with good diuresis. The patient's ACE inhibitor and beta-blockers have been restarted again. Coumadin dose was increased to 10 mg daily per pharmacy.  His INR today is in therapeutic range.  The patient was weaned off from BiPAP and is saturating above 95% on room air.  The patient is eager to go home and will be discharged home on above medications and will be followed up in my office in 1 week.  The patient has been advised to monitor PT/INR early next week.  The patient has been advised to take extra dose of Lasix if he notices progressive shortness of breath or leg swelling or weight gain and call my office immediately.     Eduardo Osier. Sharyn Lull, M.D.     [facility]  D:  09/17/2013  T:  09/17/2013  Job:  [id number]

## 2013-09-21 ENCOUNTER — Encounter: Payer: Self-pay | Admitting: Internal Medicine

## 2013-11-12 ENCOUNTER — Ambulatory Visit (INDEPENDENT_AMBULATORY_CARE_PROVIDER_SITE_OTHER): Payer: Medicaid Other | Admitting: *Deleted

## 2013-11-12 ENCOUNTER — Encounter: Payer: Self-pay | Admitting: Internal Medicine

## 2013-11-12 DIAGNOSIS — I472 Ventricular tachycardia: Secondary | ICD-10-CM

## 2013-11-12 DIAGNOSIS — I4891 Unspecified atrial fibrillation: Secondary | ICD-10-CM

## 2013-11-23 LAB — MDC_IDC_ENUM_SESS_TYPE_REMOTE
Brady Statistic RA Percent Paced: 0 %
HighPow Impedance: 58 Ohm
Implantable Pulse Generator Serial Number: 143406
Lead Channel Impedance Value: 519 Ohm
Lead Channel Sensing Intrinsic Amplitude: 22 mV
Lead Channel Sensing Intrinsic Amplitude: 4 mV
Lead Channel Setting Pacing Amplitude: 2 V
Lead Channel Setting Pacing Amplitude: 2.4 V
Lead Channel Setting Pacing Pulse Width: 0.4 ms
Lead Channel Setting Sensing Sensitivity: 0.6 mV
Zone Setting Detection Interval: 250 ms

## 2013-12-09 ENCOUNTER — Encounter: Payer: Self-pay | Admitting: *Deleted

## 2014-02-03 ENCOUNTER — Emergency Department (HOSPITAL_COMMUNITY)
Admission: EM | Admit: 2014-02-03 | Discharge: 2014-02-03 | Disposition: A | Discharge status: Home / Self Care | Payer: Medicaid Other | Attending: Emergency Medicine | Admitting: Emergency Medicine

## 2014-02-03 ENCOUNTER — Emergency Department (HOSPITAL_COMMUNITY): Payer: Medicaid Other

## 2014-02-03 ENCOUNTER — Encounter (HOSPITAL_COMMUNITY): Payer: Self-pay | Admitting: Emergency Medicine

## 2014-02-03 DIAGNOSIS — I509 Heart failure, unspecified: Secondary | ICD-10-CM | POA: Insufficient documentation

## 2014-02-03 DIAGNOSIS — Z79899 Other long term (current) drug therapy: Secondary | ICD-10-CM | POA: Insufficient documentation

## 2014-02-03 DIAGNOSIS — R1013 Epigastric pain: Secondary | ICD-10-CM | POA: Insufficient documentation

## 2014-02-03 DIAGNOSIS — Z9981 Dependence on supplemental oxygen: Secondary | ICD-10-CM | POA: Insufficient documentation

## 2014-02-03 DIAGNOSIS — F17200 Nicotine dependence, unspecified, uncomplicated: Secondary | ICD-10-CM | POA: Insufficient documentation

## 2014-02-03 DIAGNOSIS — M109 Gout, unspecified: Secondary | ICD-10-CM | POA: Insufficient documentation

## 2014-02-03 DIAGNOSIS — Z95810 Presence of automatic (implantable) cardiac defibrillator: Secondary | ICD-10-CM | POA: Insufficient documentation

## 2014-02-03 DIAGNOSIS — Z7901 Long term (current) use of anticoagulants: Secondary | ICD-10-CM | POA: Insufficient documentation

## 2014-02-03 DIAGNOSIS — R0602 Shortness of breath: Secondary | ICD-10-CM | POA: Insufficient documentation

## 2014-02-03 DIAGNOSIS — Z86718 Personal history of other venous thrombosis and embolism: Secondary | ICD-10-CM | POA: Insufficient documentation

## 2014-02-03 DIAGNOSIS — N2889 Other specified disorders of kidney and ureter: Secondary | ICD-10-CM | POA: Insufficient documentation

## 2014-02-03 DIAGNOSIS — I10 Essential (primary) hypertension: Secondary | ICD-10-CM | POA: Insufficient documentation

## 2014-02-03 DIAGNOSIS — N280 Ischemia and infarction of kidney: Secondary | ICD-10-CM

## 2014-02-03 LAB — CBC WITH DIFFERENTIAL/PLATELET
Basophils Absolute: 0 10*3/uL (ref 0.0–0.1)
Basophils Relative: 1 % (ref 0–1)
EOS ABS: 0.1 10*3/uL (ref 0.0–0.7)
EOS PCT: 1 % (ref 0–5)
HEMATOCRIT: 45.9 % (ref 39.0–52.0)
Hemoglobin: 15.8 g/dL (ref 13.0–17.0)
LYMPHS ABS: 1 10*3/uL (ref 0.7–4.0)
Lymphocytes Relative: 19 % (ref 12–46)
MCH: 31.7 pg (ref 26.0–34.0)
MCHC: 34.4 g/dL (ref 30.0–36.0)
MCV: 92.2 fL (ref 78.0–100.0)
MONO ABS: 0.5 10*3/uL (ref 0.1–1.0)
MONOS PCT: 9 % (ref 3–12)
Neutro Abs: 3.7 10*3/uL (ref 1.7–7.7)
Neutrophils Relative %: 71 % (ref 43–77)
Platelets: 207 10*3/uL (ref 150–400)
RBC: 4.98 MIL/uL (ref 4.22–5.81)
RDW: 14.9 % (ref 11.5–15.5)
WBC: 5.3 10*3/uL (ref 4.0–10.5)

## 2014-02-03 LAB — COMPREHENSIVE METABOLIC PANEL
ALT: 14 U/L (ref 0–53)
AST: 17 U/L (ref 0–37)
Albumin: 3.2 g/dL — ABNORMAL LOW (ref 3.5–5.2)
Alkaline Phosphatase: 100 U/L (ref 39–117)
BUN: 15 mg/dL (ref 6–23)
CALCIUM: 9.2 mg/dL (ref 8.4–10.5)
CO2: 26 meq/L (ref 19–32)
CREATININE: 1.09 mg/dL (ref 0.50–1.35)
Chloride: 97 mEq/L (ref 96–112)
GFR calc Af Amer: >90 mL/min (ref >90)
GFR, EST NON AFRICAN AMERICAN: 79 mL/min — AB (ref >90)
Glucose, Bld: 135 mg/dL — ABNORMAL HIGH (ref 70–99)
Potassium: 4.4 mEq/L (ref 3.7–5.3)
Sodium: 137 mEq/L (ref 137–147)
Total Bilirubin: 1.7 mg/dL — ABNORMAL HIGH (ref 0.3–1.2)
Total Protein: 6.7 g/dL (ref 6.0–8.3)

## 2014-02-03 LAB — I-STAT TROPONIN, ED: TROPONIN I, POC: 0.02 ng/mL (ref 0.00–0.08)

## 2014-02-03 LAB — PROTIME-INR
INR: 1.7 — ABNORMAL HIGH (ref 0.00–1.49)
Prothrombin Time: 19.5 seconds — ABNORMAL HIGH (ref 11.6–15.2)

## 2014-02-03 LAB — LIPASE, BLOOD: Lipase: 21 U/L (ref 11–59)

## 2014-02-03 LAB — PRO B NATRIURETIC PEPTIDE: Pro B Natriuretic peptide (BNP): 3922 pg/mL — ABNORMAL HIGH (ref 0–125)

## 2014-02-03 MED ORDER — OXYCODONE-ACETAMINOPHEN 5-325 MG PO TABS: 1.0000 | ORAL_TABLET | ORAL | Status: DC | PRN

## 2014-02-03 MED ORDER — IOHEXOL 300 MG/ML  SOLN
25.0000 mL | Freq: Once | INTRAMUSCULAR | Status: AC | PRN
  Administered 2014-02-03: 25 mL via ORAL

## 2014-02-03 MED ORDER — MORPHINE SULFATE 4 MG/ML IJ SOLN
4.0000 mg | Freq: Once | INTRAMUSCULAR | Status: AC
  Administered 2014-02-03: 4 mg via INTRAVENOUS
  Filled 2014-02-03: qty 1

## 2014-02-03 MED ORDER — IOHEXOL 300 MG/ML  SOLN
100.0000 mL | Freq: Once | INTRAMUSCULAR | Status: AC | PRN
  Administered 2014-02-03: 100 mL via INTRAVENOUS

## 2014-02-03 NOTE — Discharge Instructions (Signed)
Take the prescribed medication as directed for pain.  Do not drive while taking this. For the next 2 days, double your lasix to help with fluid control and shortness of breath. Follow-up with your primary care physician within 1 week to discuss this ED visit. Return to the ED for new or worsening symptoms.

## 2014-02-03 NOTE — ED Notes (Signed)
Patient prompted for urine, unable to urinate

## 2014-02-03 NOTE — ED Provider Notes (Signed)
CSN: [id number]     Arrival date & time 02/03/14  1508 History   First MD Initiated Contact with Patient 02/03/14 1510     Chief Complaint  Patient presents with  . Abdominal Pain  . Shortness of Breath   (Consider location/radiation/quality/duration/timing/severity/associated sxs/prior Treatment) Patient is a 48 y.o. male presenting with abdominal pain and shortness of breath. The history is provided by the patient and medical records.  Abdominal Pain Associated symptoms: shortness of breath   Shortness of Breath Associated symptoms: abdominal pain    This is a 48 year old male with past medical history significant for hypertension, congestive heart failure, ventricular tachycardia status post AICD placement, presenting to the ED for left abdominal pain which woke him from sleep this morning. Patient states pain is described as a sharp, stabbing pain without radiation. He denies any associated nausea, vomiting, diarrhea, or fever. No recent sick contacts.  No urinary sx.  No prior hx of kidney stones.  No prior abdominal surgeries.  Last BM yesterday which normal.  Patient also complains of shortness of breath over the past several days, worse with exertional activtiy. States he feels he has gained some weight but has not been doing his daily weights as instructed.   He denies any chest pain.   Patient states he was diagnosed with a right upper extremity DVT several months ago, currently on Coumadin although he states they have been having trouble keeping within therapeutic range.   No prior hx of PE.  No cough or other URI sx.  AICD is [employer], placed 2010. Pt is not currently on home O2.  VS stable on arrival, O2 stable on RA. Cardiologist-- Sharyn LullHarwani Past Medical History  Diagnosis Date  . Ventricular tachycardia   . AICD (automatic cardioverter/defibrillator) present     [employer] Teligen 100  . Tobacco abuse   . Alcohol abuse     hx  . Cardiomyopathy     secondary    . HTN (hypertension)     uncontrolled  . CHF (congestive heart failure)   . Gout    Past Surgical History  Procedure Laterality Date  . Cardiac defibrillator placement  2010    [employer] tleigen 100  . Embolectomy Right 06/04/2013    Procedure: Upper Extremity Thrombectomy;  Surgeon: Nada LibmanVance W Brabham, MD;  [facility];  Service: Vascular;  Laterality: Right;   Family History  Problem Relation Age of Onset  . Cardiomyopathy Father     on a defib  . Diabetes Father    History  Substance Use Topics  . Smoking status: Current Every Day Smoker -- 0.50 packs/day for 30 years    Types: Cigarettes  . Smokeless tobacco: Never Used     Comment: I already have information on quitting"  . Alcohol Use: 0.6 oz/week    1 Cans of beer per week    Review of Systems  Respiratory: Positive for shortness of breath.   Gastrointestinal: Positive for abdominal pain.  All other systems reviewed and are negative.      Allergies  Review of patient's allergies indicates no known allergies.  Home Medications   Current Outpatient Rx  Name  Route  Sig  Dispense  Refill  . allopurinol (ZYLOPRIM) 100 MG tablet   Oral   Take 100 mg by mouth daily.         . carvedilol (COREG) 6.25 MG tablet   Oral   Take 6.25 mg by mouth 2 (two) times daily with  a meal.         . digoxin (LANOXIN) 0.25 MG tablet   Oral   Take 0.25 mg by mouth daily.         . furosemide (LASIX) 40 MG tablet   Oral   Take 40 mg by mouth 2 (two) times daily.         Marland Kitchen lisinopril (PRINIVIL,ZESTRIL) 20 MG tablet   Oral   Take 20 mg by mouth daily.           Marland Kitchen spironolactone (ALDACTONE) 25 MG tablet   Oral   Take 25 mg by mouth daily.           Marland Kitchen warfarin (COUMADIN) 10 MG tablet   Oral   Take 1 tablet (10 mg total) by mouth daily. Take one tablet daily   35 tablet   3    BP 107/71  Temp(Src) 97.3 F (36.3 C) (Oral)  Resp 17  Ht 5\' 7"  (1.702 m)  Wt 195 lb (88.451 kg)  BMI 30.53 kg/m2   SpO2 96%  Physical Exam  Nursing note and vitals reviewed. Constitutional: He is oriented to person, place, and time. He appears well-developed and well-nourished. No distress.  HENT:  Head: Normocephalic and atraumatic.  Mouth/Throat: Oropharynx is clear and moist.  Eyes: Conjunctivae and EOM are normal. Pupils are equal, round, and reactive to light.  Neck: Normal range of motion.  Cardiovascular: Normal rate, regular rhythm and normal heart sounds.   Pulmonary/Chest: Effort normal. No accessory muscle usage. Not tachypneic. No respiratory distress. He has no wheezes. He has no rhonchi. He has rales. He exhibits no tenderness, no bony tenderness, no deformity and no retraction.  Faint crackles at bilateral bases; no signs of respiratory distress  Abdominal: Soft. Bowel sounds are normal. There is tenderness. There is no guarding and no CVA tenderness.    Abdomen soft, non-distended, mild TTP epigastrium and left abdomen; no flank tenderness  Musculoskeletal: Normal range of motion. He exhibits no edema.  No LE edema, calf asymmetry, or palpable cords; negative Homan's sign bilaterally  Neurological: He is alert and oriented to person, place, and time.  Skin: Skin is warm and dry. He is not diaphoretic.  Psychiatric: He has a normal mood and affect.    ED Course  Procedures (including critical care time)   Date: 02/03/2014  Rate: 67  Rhythm: normal sinus rhythm and premature ventricular contractions (PVC)  QRS Axis: normal  Intervals: normal  ST/T Wave abnormalities: normal  Conduction Disutrbances:none  Narrative Interpretation:   Old EKG Reviewed: unchanged   Labs Review Labs Reviewed  COMPREHENSIVE METABOLIC PANEL - Abnormal; Notable for the following:    Glucose, Bld 135 (*)    Albumin 3.2 (*)    Total Bilirubin 1.7 (*)    GFR calc non Af Amer 79 (*)    All other components within normal limits  PRO B NATRIURETIC PEPTIDE - Abnormal; Notable for the following:     Pro B Natriuretic peptide (BNP) 3922.0 (*)    All other components within normal limits  PROTIME-INR - Abnormal; Notable for the following:    Prothrombin Time 19.5 (*)    INR 1.70 (*)    All other components within normal limits  CBC WITH DIFFERENTIAL  LIPASE, BLOOD  URINALYSIS, ROUTINE W REFLEX MICROSCOPIC  I-STAT TROPOININ, ED   Imaging Review Ct Abdomen Pelvis W Contrast  02/03/2014   CLINICAL DATA Acute onset left-sided abdominal pain earlier today which woke patient from  sleep, pain now localizing under the left rib cage.  EXAM CT ABDOMEN AND PELVIS WITH CONTRAST  TECHNIQUE Multidetector CT imaging of the abdomen and pelvis was performed using the standard protocol following bolus administration of intravenous contrast.  CONTRAST OMNIPAQUE IOHEXOL 300 MG/ML IV. Oral contrast was also administered.  COMPARISON No prior CT.  Acute abdomen series earlier same date.  FINDINGS Weight shaped geographic area of low attenuation involving the lower pole of the left kidney, associated with mild edema in the adjacent perinephric fat. No similar findings elsewhere in either kidney. Focal areas of scarring in the mid and lower pole of the right kidney. Approximate 2 x 2.5 cm simple cyst arising from the mid and upper pole of the right kidney. Left renal artery atherosclerotic though patent at its origin.  Diffuse hepatic steatosis without focal hepatic parenchymal abnormality. Enlargement of the left lobe and caudate lobe relative to the right lobe. Reflux of contrast from the right atrium into the intrahepatic IVC and the hepatic veins.  Normal appearing spleen, pancreas, adrenal glands, and gallbladder. No biliary ductal dilation. Moderate to severe aortoiliofemoral atherosclerosis without aneurysm. Visceral arteries patent. No significant lymphadenopathy.  Stomach decompressed and unremarkable. Normal appearing small bowel and colon. Mobile cecum present in the right mid abdomen. Appendix not clearly  identified, but no pericecal inflammation. Umbilical hernia containing fat which is mildly edematous. No ascites.  Urinary bladder unremarkable. Prostate gland normal in size for age. Normal seminal vesicles.  Bone window images unremarkable. Bone island in the T11 vertebral body. Mild atelectasis in the visualized lower lobes. Massive cardiac enlargement, with left ventricular enlargement, right ventricular enlargement and right ventricular hypertrophy, and right atrial enlargement. Transvenous pacemaker lead tip at the RV apex.  IMPRESSION 1. Infarct involving of the lower pole of the left kidney. Pyelonephritis can have a similar appearance, but there are no similar findings elsewhere in either kidney to suggest infection. Please correlate with urinalysis. 2. CT findings consistent with cirrhosis. Diffuse hepatic steatosis. No focal hepatic parenchymal abnormality. 3. Umbilical hernia containing fat which is edematous or indurated. 4. Massive cardiac enlargement with left ventricular enlargement, right ventricular enlargement and right ventricular hypertrophy, and right atrial enlargement. 5. Reflux of contrast into the intrahepatic IVC and hepatic veins is consistent with right heart failure and/or tricuspid regurgitation. 6. Severe aortoiliofemoral atherosclerosis which is advanced for age. These results were called by telephone at the time of interpretation on 02/03/2014 at 8:26 PM to [facility], who verbally acknowledged these results.  SIGNATURE  Electronically Signed   By: Hulan Saas M.D.   On: 02/03/2014 20:29   Dg Abd Acute W/chest  02/03/2014   CLINICAL DATA Pain.  EXAM ACUTE ABDOMEN SERIES (ABDOMEN 2 VIEW & CHEST 1 VIEW)  COMPARISON DG CHEST 2 VIEW dated 09/12/2013; DG CHEST 2 VIEW dated 03/29/2013 .  FINDINGS Mediastinum and hilar structures normal. Severe cardiomegaly is present. Minimal pulmonary venous prominence noted. Minimal interstitial prominence noted. These findings are consistent  with mild congestive heart failure. Cardiac pacer node with lead tips in right atrium and right ventricle. No prominent pleural effusion. No pneumothorax. No acute osseous abnormality.  Distended loops of small bowel noted. Colonic gas pattern is normal. Adynamic ileus or partial small-bowel obstruction could present in this fashion. No free air. No pathologic intra-abdominal calcifications. Degenerative changes thoracolumbar spine both hips. No acute bony abnormality.  IMPRESSION 1. Severe cardiomegaly with mild changes of congestive heart failure and pulmonary interstitial edema. Cardiac pacer present. 2.  Adynamic ileus versus partial small bowel obstruction. Distended loops of proximal small bowel present.  SIGNATURE  Electronically Signed   By: Maisie Fus  Register   On: 02/03/2014 17:21     EKG Interpretation None      MDM   Final diagnoses:  Renal infarct  Shortness of breath   EKG normal sinus rhythm with PVCs, no acute ischemic changes. Troponin is negative. Chest x-ray with mild vascular congestion, consistent with congestive heart failure. BNP elevated at 3900, stable when compared with previous visits.  Pt is currently on coumadin, although mildly sub-therapuetic i have low suspicion for PE at this time (no tachycardia, no hypoxia, no chest pain).  Plain film with concern for ileus versus partial small bowel obstruction, CT scan ordered which shows left renal infarct.  Discussed case with nephrology, Dr. Lacy Duverney-- given renal function is preserved, recommended close monitoring with PCP and possible coag studies given prior RUE DVT and now renal infarct despite anti-coagulation.  U/a ordered but pt has been unable to void while in the ED (states he took lasix before coming to ED).  Regarding SOB-- pt is not severely symptomatic, does not appear fluid overloaded, has no signs of respiratory distress, and has good O2 saturation on RA.  I feel he can be safely managed as an OP.  Will have him  double lasix for the next 2 days.  Again, recommended close FU with PCP to discuss this ED visit.  Discussed plan with pt, he acknowledged understanding and agreed with plan of care.  Strict return precautions advised for new or worsening symptoms.  Discussed with Dr. Radford Pax who agrees with assessment and plan of care.  Garlon Hatchet, PA-C 02/03/14 2257

## 2014-02-03 NOTE — ED Notes (Signed)
Pt unable to void..the patient is aware of needed void

## 2014-02-03 NOTE — ED Notes (Signed)
Per EMS: abdominal since this am, woke pt up from sleep, abdominal left side upper and lower, no distention, no pain on palpation, goes under left rib cage.  Initially c  /o chest pain but denies,  Given 324 ASA by FD, EMS withheld ntg, rates abdominal pain 9/10.  Increasing weakness for 1 week, which is typical for him for CHF worsening symptoms. Initially junctional on monitor, then sinus rhythm with frequent PVCs.  Has internal Defib in place,.   Had several runs of consecutive PVCS, 3 and 4 in length.

## 2014-02-11 ENCOUNTER — Ambulatory Visit (INDEPENDENT_AMBULATORY_CARE_PROVIDER_SITE_OTHER): Payer: Medicaid Other | Admitting: *Deleted

## 2014-02-11 DIAGNOSIS — I509 Heart failure, unspecified: Secondary | ICD-10-CM

## 2014-02-11 DIAGNOSIS — I4891 Unspecified atrial fibrillation: Secondary | ICD-10-CM

## 2014-02-12 ENCOUNTER — Encounter: Payer: Self-pay | Admitting: Internal Medicine

## 2014-02-12 LAB — MDC_IDC_ENUM_SESS_TYPE_REMOTE
Battery Remaining Longevity: 77 mo
HighPow Impedance: 49 Ohm
Lead Channel Impedance Value: 497 Ohm
Lead Channel Impedance Value: 533 Ohm
Lead Channel Sensing Intrinsic Amplitude: 18 mV
Lead Channel Setting Pacing Pulse Width: 0.4 ms
Lead Channel Setting Sensing Sensitivity: 0.6 mV
MDC IDC MSMT LEADCHNL RA SENSING INTR AMPL: 2.4 mV
MDC IDC PG SERIAL: 143406
MDC IDC SET LEADCHNL RA PACING AMPLITUDE: 2 V
MDC IDC SET LEADCHNL RV PACING AMPLITUDE: 2.4 V
MDC IDC SET ZONE DETECTION INTERVAL: 300 ms
Zone Setting Detection Interval: 250 ms
Zone Setting Detection Interval: 352.94 ms

## 2014-02-15 ENCOUNTER — Encounter: Payer: Self-pay | Admitting: *Deleted

## 2014-02-15 NOTE — ED Provider Notes (Signed)
Medical screening examination/treatment/procedure(s) were conducted as a shared visit with non-physician practitioner(s) and myself.  I personally evaluated the patient during the encounter   Nelia Shi, MD 02/15/14 1120

## 2014-02-23 NOTE — Progress Notes (Signed)
PPM remote 

## 2014-03-09 ENCOUNTER — Encounter (HOSPITAL_COMMUNITY): Payer: Self-pay | Admitting: Emergency Medicine

## 2014-03-09 ENCOUNTER — Emergency Department (HOSPITAL_COMMUNITY): Payer: Medicaid Other

## 2014-03-09 ENCOUNTER — Inpatient Hospital Stay (HOSPITAL_COMMUNITY)
Admission: EM | Admit: 2014-03-09 | Discharge: 2014-03-14 | DRG: 281 | Disposition: A | Discharge status: Home / Self Care | Payer: Medicaid Other | Attending: Cardiology | Admitting: Cardiology

## 2014-03-09 DIAGNOSIS — I509 Heart failure, unspecified: Secondary | ICD-10-CM

## 2014-03-09 DIAGNOSIS — I5023 Acute on chronic systolic (congestive) heart failure: Principal | ICD-10-CM | POA: Diagnosis present

## 2014-03-09 DIAGNOSIS — I214 Non-ST elevation (NSTEMI) myocardial infarction: Secondary | ICD-10-CM

## 2014-03-09 DIAGNOSIS — I10 Essential (primary) hypertension: Secondary | ICD-10-CM | POA: Diagnosis present

## 2014-03-09 DIAGNOSIS — I252 Old myocardial infarction: Secondary | ICD-10-CM

## 2014-03-09 DIAGNOSIS — I4729 Other ventricular tachycardia: Secondary | ICD-10-CM | POA: Diagnosis present

## 2014-03-09 DIAGNOSIS — I5021 Acute systolic (congestive) heart failure: Secondary | ICD-10-CM

## 2014-03-09 DIAGNOSIS — Z95810 Presence of automatic (implantable) cardiac defibrillator: Secondary | ICD-10-CM

## 2014-03-09 DIAGNOSIS — I472 Ventricular tachycardia, unspecified: Secondary | ICD-10-CM

## 2014-03-09 DIAGNOSIS — M109 Gout, unspecified: Secondary | ICD-10-CM | POA: Diagnosis present

## 2014-03-09 DIAGNOSIS — I4891 Unspecified atrial fibrillation: Secondary | ICD-10-CM | POA: Diagnosis present

## 2014-03-09 DIAGNOSIS — I428 Other cardiomyopathies: Secondary | ICD-10-CM | POA: Diagnosis present

## 2014-03-09 DIAGNOSIS — I429 Cardiomyopathy, unspecified: Secondary | ICD-10-CM

## 2014-03-09 DIAGNOSIS — Z7901 Long term (current) use of anticoagulants: Secondary | ICD-10-CM

## 2014-03-09 DIAGNOSIS — Z79899 Other long term (current) drug therapy: Secondary | ICD-10-CM

## 2014-03-09 DIAGNOSIS — F17200 Nicotine dependence, unspecified, uncomplicated: Secondary | ICD-10-CM | POA: Diagnosis present

## 2014-03-09 LAB — CBC WITH DIFFERENTIAL/PLATELET
BASOS PCT: 1 % (ref 0–1)
Basophils Absolute: 0 10*3/uL (ref 0.0–0.1)
EOS ABS: 0.1 10*3/uL (ref 0.0–0.7)
EOS PCT: 2 % (ref 0–5)
HEMATOCRIT: 44.6 % (ref 39.0–52.0)
Hemoglobin: 15.4 g/dL (ref 13.0–17.0)
Lymphocytes Relative: 41 % (ref 12–46)
Lymphs Abs: 2.8 10*3/uL (ref 0.7–4.0)
MCH: 31.4 pg (ref 26.0–34.0)
MCHC: 34.5 g/dL (ref 30.0–36.0)
MCV: 90.8 fL (ref 78.0–100.0)
MONO ABS: 0.4 10*3/uL (ref 0.1–1.0)
Monocytes Relative: 6 % (ref 3–12)
Neutro Abs: 3.5 10*3/uL (ref 1.7–7.7)
Neutrophils Relative %: 50 % (ref 43–77)
Platelets: 173 10*3/uL (ref 150–400)
RBC: 4.91 MIL/uL (ref 4.22–5.81)
RDW: 16.8 % — AB (ref 11.5–15.5)
WBC: 6.8 10*3/uL (ref 4.0–10.5)

## 2014-03-09 LAB — COMPREHENSIVE METABOLIC PANEL
ALBUMIN: 3.4 g/dL — AB (ref 3.5–5.2)
ALT: 19 U/L (ref 0–53)
AST: 27 U/L (ref 0–37)
Alkaline Phosphatase: 107 U/L (ref 39–117)
BUN: 21 mg/dL (ref 6–23)
CALCIUM: 9.5 mg/dL (ref 8.4–10.5)
CO2: 26 mEq/L (ref 19–32)
CREATININE: 1.2 mg/dL (ref 0.50–1.35)
Chloride: 97 mEq/L (ref 96–112)
GFR calc Af Amer: 82 mL/min — ABNORMAL LOW (ref >90)
GFR, EST NON AFRICAN AMERICAN: 70 mL/min — AB (ref >90)
Glucose, Bld: 152 mg/dL — ABNORMAL HIGH (ref 70–99)
Potassium: 4.3 mEq/L (ref 3.7–5.3)
Sodium: 138 mEq/L (ref 137–147)
TOTAL PROTEIN: 7 g/dL (ref 6.0–8.3)
Total Bilirubin: 3.3 mg/dL — ABNORMAL HIGH (ref 0.3–1.2)

## 2014-03-09 LAB — APTT: aPTT: 62 seconds — ABNORMAL HIGH (ref 24–37)

## 2014-03-09 LAB — I-STAT TROPONIN, ED: Troponin i, poc: 0.4 ng/mL (ref 0.00–0.08)

## 2014-03-09 LAB — BASIC METABOLIC PANEL
BUN: 18 mg/dL (ref 6–23)
CALCIUM: 9.7 mg/dL (ref 8.4–10.5)
CO2: 27 meq/L (ref 19–32)
CREATININE: 1.19 mg/dL (ref 0.50–1.35)
Chloride: 97 mEq/L (ref 96–112)
GFR calc Af Amer: 83 mL/min — ABNORMAL LOW (ref >90)
GFR calc non Af Amer: 71 mL/min — ABNORMAL LOW (ref >90)
GLUCOSE: 111 mg/dL — AB (ref 70–99)
Potassium: 4.5 mEq/L (ref 3.7–5.3)
Sodium: 138 mEq/L (ref 137–147)

## 2014-03-09 LAB — PRO B NATRIURETIC PEPTIDE
PRO B NATRI PEPTIDE: 6701 pg/mL — AB (ref 0–125)
Pro B Natriuretic peptide (BNP): 6667 pg/mL — ABNORMAL HIGH (ref 0–125)

## 2014-03-09 LAB — CBC
HCT: 45 % (ref 39.0–52.0)
HEMOGLOBIN: 15.8 g/dL (ref 13.0–17.0)
MCH: 32 pg (ref 26.0–34.0)
MCHC: 35.1 g/dL (ref 30.0–36.0)
MCV: 91.3 fL (ref 78.0–100.0)
Platelets: 174 10*3/uL (ref 150–400)
RBC: 4.93 MIL/uL (ref 4.22–5.81)
RDW: 16.5 % — ABNORMAL HIGH (ref 11.5–15.5)
WBC: 6.5 10*3/uL (ref 4.0–10.5)

## 2014-03-09 LAB — TROPONIN I
TROPONIN I: 0.68 ng/mL — AB (ref <0.30)
Troponin I: 0.7 ng/mL (ref <0.30)
Troponin I: 0.71 ng/mL (ref <0.30)

## 2014-03-09 LAB — PROTIME-INR
INR: 1.58 — AB (ref 0.00–1.49)
INR: 1.47 (ref 0.00–1.49)
PROTHROMBIN TIME: 18.4 s — AB (ref 11.6–15.2)
PROTHROMBIN TIME: 17.4 s — AB (ref 11.6–15.2)

## 2014-03-09 LAB — DIGOXIN LEVEL: DIGOXIN LVL: 0.5 ng/mL — AB (ref 0.8–2.0)

## 2014-03-09 LAB — MRSA PCR SCREENING: MRSA BY PCR: NEGATIVE

## 2014-03-09 LAB — MAGNESIUM: MAGNESIUM: 1.8 mg/dL (ref 1.5–2.5)

## 2014-03-09 LAB — TSH: TSH: 1.91 u[IU]/mL (ref 0.350–4.500)

## 2014-03-09 LAB — HEPARIN LEVEL (UNFRACTIONATED): Heparin Unfractionated: 0.44 [IU]/mL (ref 0.30–0.70)

## 2014-03-09 MED ORDER — ALLOPURINOL 100 MG PO TABS
100.0000 mg | ORAL_TABLET | Freq: Every day | ORAL | Status: DC
  Administered 2014-03-09 – 2014-03-14 (×6): 100 mg via ORAL
  Filled 2014-03-09 (×6): qty 1

## 2014-03-09 MED ORDER — WARFARIN - PHARMACIST DOSING INPATIENT
Freq: Every day | Status: DC
  Administered 2014-03-10 – 2014-03-11 (×2): 1

## 2014-03-09 MED ORDER — ASPIRIN 81 MG PO CHEW
324.0000 mg | CHEWABLE_TABLET | Freq: Once | ORAL | Status: DC
  Filled 2014-03-09: qty 4

## 2014-03-09 MED ORDER — CARVEDILOL 3.125 MG PO TABS
3.1250 mg | ORAL_TABLET | Freq: Two times a day (BID) | ORAL | Status: DC
  Administered 2014-03-09 – 2014-03-12 (×2): 3.125 mg via ORAL
  Filled 2014-03-09 (×8): qty 1

## 2014-03-09 MED ORDER — HEPARIN BOLUS VIA INFUSION: 4000.0000 [IU] | Freq: Once | INTRAVENOUS | Status: DC

## 2014-03-09 MED ORDER — FUROSEMIDE 10 MG/ML IJ SOLN
40.0000 mg | Freq: Two times a day (BID) | INTRAMUSCULAR | Status: DC
  Administered 2014-03-09 – 2014-03-14 (×10): 40 mg via INTRAVENOUS
  Filled 2014-03-09 (×11): qty 4

## 2014-03-09 MED ORDER — FUROSEMIDE 10 MG/ML IJ SOLN
40.0000 mg | Freq: Once | INTRAMUSCULAR | Status: AC
  Administered 2014-03-09: 40 mg via INTRAVENOUS
  Filled 2014-03-09: qty 4

## 2014-03-09 MED ORDER — FUROSEMIDE 10 MG/ML IJ SOLN
INTRAMUSCULAR | Status: AC
  Filled 2014-03-09: qty 4

## 2014-03-09 MED ORDER — DIGOXIN 250 MCG PO TABS
0.2500 mg | ORAL_TABLET | Freq: Every day | ORAL | Status: DC
  Administered 2014-03-09 – 2014-03-14 (×6): 0.25 mg via ORAL
  Filled 2014-03-09 (×6): qty 1

## 2014-03-09 MED ORDER — HEPARIN (PORCINE) IN NACL 100-0.45 UNIT/ML-% IJ SOLN
1450.0000 [IU]/h | INTRAMUSCULAR | Status: DC
  Administered 2014-03-09 – 2014-03-10 (×2): 1400 [IU]/h via INTRAVENOUS
  Administered 2014-03-11 – 2014-03-14 (×5): 1450 [IU]/h via INTRAVENOUS
  Filled 2014-03-09 (×11): qty 250

## 2014-03-09 MED ORDER — LISINOPRIL 10 MG PO TABS
10.0000 mg | ORAL_TABLET | Freq: Every day | ORAL | Status: DC
  Administered 2014-03-09 – 2014-03-13 (×5): 10 mg via ORAL
  Filled 2014-03-09 (×6): qty 1

## 2014-03-09 MED ORDER — SODIUM CHLORIDE 0.9 % IJ SOLN
3.0000 mL | Freq: Two times a day (BID) | INTRAMUSCULAR | Status: DC
  Administered 2014-03-09 – 2014-03-13 (×5): 3 mL via INTRAVENOUS

## 2014-03-09 MED ORDER — WARFARIN SODIUM 10 MG PO TABS: 10.0000 mg | ORAL_TABLET | ORAL | Status: DC

## 2014-03-09 MED ORDER — ASPIRIN 81 MG PO CHEW
324.0000 mg | CHEWABLE_TABLET | Freq: Once | ORAL | Status: AC
  Administered 2014-03-09: 324 mg via ORAL

## 2014-03-09 MED ORDER — SODIUM CHLORIDE 0.9 % IJ SOLN: 3.0000 mL | INTRAMUSCULAR | Status: DC | PRN

## 2014-03-09 MED ORDER — ASPIRIN EC 81 MG PO TBEC
81.0000 mg | DELAYED_RELEASE_TABLET | Freq: Every day | ORAL | Status: DC
  Administered 2014-03-09 – 2014-03-14 (×6): 81 mg via ORAL
  Filled 2014-03-09 (×6): qty 1

## 2014-03-09 MED ORDER — SPIRONOLACTONE 25 MG PO TABS
25.0000 mg | ORAL_TABLET | Freq: Every day | ORAL | Status: DC
  Administered 2014-03-09 – 2014-03-14 (×6): 25 mg via ORAL
  Filled 2014-03-09 (×6): qty 1

## 2014-03-09 MED ORDER — WARFARIN SODIUM 7.5 MG PO TABS
7.5000 mg | ORAL_TABLET | Freq: Once | ORAL | Status: AC
  Administered 2014-03-09: 7.5 mg via ORAL
  Filled 2014-03-09: qty 1

## 2014-03-09 MED ORDER — SODIUM CHLORIDE 0.9 % IV SOLN: 250.0000 mL | INTRAVENOUS | Status: DC | PRN

## 2014-03-09 MED ORDER — HEPARIN BOLUS VIA INFUSION
3000.0000 [IU] | Freq: Once | INTRAVENOUS | Status: AC
  Administered 2014-03-09: 3000 [IU] via INTRAVENOUS
  Filled 2014-03-09: qty 3000

## 2014-03-09 NOTE — ED Provider Notes (Signed)
  Face-to-face evaluation   History: Shortness of breath, primarily orthopnea , without leg swelling, for several days. He's taking his medications, as prescribed. He does not use aspirin. He, states that he is anticoagulated because of "thick blood".  Physical exam: Alert, calm, cooperative. Heart regular rate and rhythm. No murmur. Lungs clear anteriorly. Legs, no edema.  15:30- pack monitor showed wide complex rhythm, at same heart rate, as previous, with normal blood pressure time. Twelve-lead EKG repeated at 15:29 is unchanged from prior. Differential diagnosis for the rhythm change is apparent. Conduction, versus ventricular tachycardia, brief and self resolved. Patient is awaiting cardiology consultation, and is stable at this time. He does not have acute metabolic problems, that need to be corrected.  3:52 PM- repeat troponin has elevated. Will contact cardiology to inform him.  CRITICAL CARE Performed by: Flint Melter Total critical care time: 45 minutes Critical care time was exclusive of separately billable procedures and treating other patients. Critical care was necessary to treat or prevent imminent or life-threatening deterioration. Critical care was time spent personally by me on the following activities: development of treatment plan with patient and/or surrogate as well as nursing, discussions with consultants, evaluation of patient's response to treatment, examination of patient, obtaining history from patient or surrogate, ordering and performing treatments and interventions, ordering and review of laboratory studies, ordering and review of radiographic studies, pulse oximetry and re-evaluation of patient's condition.  Medical screening examination/treatment/procedure(s) were conducted as a shared visit with non-physician practitioner(s) and myself.  I personally evaluated the patient during the encounter  Flint Melter, MD 03/09/14 2010

## 2014-03-09 NOTE — ED Notes (Signed)
Patient denies chest pain but does say he is SOB, has had some chills and extremely weak for about the last two days.

## 2014-03-09 NOTE — ED Notes (Signed)
Cardiology consult at bedside.

## 2014-03-09 NOTE — ED Provider Notes (Signed)
CSN: [id number]     Arrival date & time 03/09/14  1259 History   First MD Initiated Contact with Patient 03/09/14 1359     Chief Complaint  Patient presents with  . 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 fib, history of alcohol tobacco and polysubstance abuse in the past -- presents with worsening SOB, fatigue, cough, muscle aches for the past 2 days. No chest pain. SOB is worse with lying flat. States cough is similar to when he has fluid in his lungs. He has had to sleep sitting up and wakes up with episodes of shortness of breath relieved with sitting upright. Patient is supposed to be taking Lasix twice a day, but is uncertain if he took it yesterday and has not taken it today. He has also not taken his Coumadin in the past 2 days. The onset of this condition was acute. The course is constant. Alleviating factors: none.    Patient is a 48 y.o. male presenting with shortness of breath. The history is provided by the patient and medical records.  Shortness of Breath Associated symptoms: chest pain   Associated symptoms: no abdominal pain, no cough, no diaphoresis, no fever, no neck pain, no rash and no vomiting     Past Medical History  Diagnosis Date  . Ventricular tachycardia   . AICD (automatic cardioverter/defibrillator) present     [employer] 100  . Tobacco abuse   . Alcohol abuse     hx  . Cardiomyopathy     secondary  . HTN (hypertension)     uncontrolled  . CHF (congestive heart failure)   . Gout    Past Surgical  History  Procedure Laterality Date  . Cardiac defibrillator placement  2010    [employer] tleigen 100  . Embolectomy Right 06/04/2013    Procedure: Upper Extremity Thrombectomy;  Surgeon: Nada Libman, MD;  Location: [facility] OR;  Service: Vascular;  Laterality: Right;   Family History  Problem Relation Age of Onset  . Cardiomyopathy Father     on a defib  . Diabetes Father    History  Substance Use Topics  . Smoking status: Current Every Day Smoker -- 0.50 packs/day for 30 years    Types: Cigarettes  . Smokeless tobacco: Never Used     Comment: I already have information on quitting"  . Alcohol Use: 0.6 oz/week    1 Cans of beer per week    Review of Systems  Constitutional: Positive for fatigue. Negative for fever and diaphoresis.  Eyes: Negative for redness.  Respiratory: Positive for shortness of breath. Negative for cough.   Cardiovascular: Positive for chest pain. Negative for palpitations and leg swelling.       +PND  Gastrointestinal: Negative for nausea, vomiting and abdominal pain.  Genitourinary: Negative for dysuria.  Musculoskeletal: Negative for back pain and neck pain.  Skin: Negative for rash.  Neurological: Negative for syncope and light-headedness.    Allergies  Review of patient's allergies indicates no known allergies.  Home Medications   Prior to Admission medications   Medication Sig Start Date End Date Taking? Authorizing Provider  allopurinol (ZYLOPRIM) 100 MG tablet Take 100 mg by  mouth daily.    Historical Provider, MD  carvedilol (COREG) 6.25 MG tablet Take 6.25 mg by mouth 2 (two) times daily with a meal.    Historical Provider, MD  digoxin (LANOXIN) 0.25 MG tablet Take 0.25 mg by mouth daily.    Historical Provider, MD  furosemide (LASIX) 40 MG tablet Take 40 mg by mouth 2 (two) times daily.    Historical Provider, MD  lisinopril (PRINIVIL,ZESTRIL) 20 MG tablet Take 20 mg by mouth daily.      Historical Provider, MD   oxyCODONE-acetaminophen (PERCOCET/ROXICET) 5-325 MG per tablet Take 1 tablet by mouth every 4 (four) hours as needed. 02/03/14   Garlon HatchetLisa M Sanders, PA-C  spironolactone (ALDACTONE) 25 MG tablet Take 25 mg by mouth daily.      Historical Provider, MD  warfarin (COUMADIN) 10 MG tablet Take 1 tablet (10 mg total) by mouth daily. Take one tablet daily 09/17/13   Robynn PaneMohan N Harwani, MD   BP 123/85  Pulse 90  Resp 20  Ht 5\' 7"  (1.702 m)  Wt 199 lb 11.2 oz (90.583 kg)  BMI 31.27 kg/m2  SpO2 100%  Physical Exam  Nursing note and vitals reviewed. Constitutional: He appears well-developed and well-nourished.  HENT:  Head: Normocephalic and atraumatic.  Mouth/Throat: Oropharynx is clear and moist and mucous membranes are normal. Mucous membranes are not dry.  Eyes: Conjunctivae are normal.  Neck: Trachea normal and normal range of motion. Neck supple. Normal carotid pulses and no JVD present. No muscular tenderness present. Carotid bruit is not present. No tracheal deviation present.  Cardiovascular: Normal rate, regular rhythm, S1 normal, S2 normal, normal heart sounds and intact distal pulses.  Exam reveals no distant heart sounds and no decreased pulses.   No murmur heard. Pulmonary/Chest: Effort normal and breath sounds normal. No respiratory distress. He has no wheezes. He exhibits no tenderness.  ICD pocket L upper chest without abnormality.   Abdominal: Soft. Normal aorta and bowel sounds are normal. There is no tenderness. There is no rebound and no guarding.  Musculoskeletal: He exhibits edema. He exhibits no tenderness.  Trace bilateral LE edema of ankles.   Neurological: He is alert.  Skin: Skin is warm and dry. He is not diaphoretic. No cyanosis. No pallor.  Psychiatric: He has a normal mood and affect.    ED Course  Procedures (including critical care time) Labs Review Labs Reviewed  CBC - Abnormal; Notable for the following:    RDW 16.5 (*)    All other components within normal  limits  BASIC METABOLIC PANEL - Abnormal; Notable for the following:    Glucose, Bld 111 (*)    GFR calc non Af Amer 71 (*)    GFR calc Af Amer 83 (*)    All other components within normal limits  PROTIME-INR - Abnormal; Notable for the following:    Prothrombin Time 18.4 (*)    INR 1.58 (*)    All other components within normal limits  I-STAT TROPOININ, ED - Abnormal; Notable for the following:    Troponin i, poc 0.40 (*)    All other components within normal limits  PRO B NATRIURETIC PEPTIDE  TROPONIN I  TROPONIN I  TROPONIN I    Imaging Review Dg Chest 2 View  03/09/2014   CLINICAL DATA:  Chest pain  EXAM: CHEST  2 VIEW  COMPARISON:  Prior chest x-ray an acute abdominal series 311 2015  FINDINGS: Stable cardiomegaly. Left subclavian approach cardiac rhythm maintenance device. Leads project  over the right atrium and right ventricle. Mild vascular congestion without overt edema. No overt pulmonary edema. Central airway thickening similar compared to prior. No pleural effusion or pneumothorax. No acute osseous abnormality.  IMPRESSION: 1. Stable chest x-ray without evidence of acute cardiopulmonary process. 2. Unchanged marked cardiomegaly and vascular congestion without overt edema.   Electronically Signed   By: Malachy Moan M.D.   On: 03/09/2014 13:56     EKG Interpretation   Date/Time:  Tuesday March 09 2014 13:03:45 EDT Ventricular Rate:  87 PR Interval:  158 QRS Duration: 94 QT Interval:  388 QTC Calculation: 466 R Axis:   -82 Text Interpretation:  Normal sinus rhythm Biatrial enlargement Incomplete  right bundle branch block Left anterior fascicular block Anterior infarct  , age undetermined Abnormal ECG      Patient seen and examined. EKG reviewed. ASA ordered. Work-up initiated. Medications ordered.   Patient d/w and seen by Dr. Effie Shy.   Vital signs reviewed and are as follows: Filed Vitals:   03/09/14 1309  BP: 123/85  Pulse: 90  Resp: 20    2:36 PM  Spoke with Dr. Sharyn Lull who will see. Will admit.   3:31 PM INR low. Will start heparin (per request Dr. Sharyn Lull as INR subtherapeutic). Intermittent V tach on monitor. Resolved without ICD firing. Patient asymptomatic.   MDM   Final diagnoses:  NSTEMI (non-ST elevated myocardial infarction)   Admit for above: NSTEMI vs CHF exacerbation    Renne Crigler, PA-C 03/09/14 2008

## 2014-03-09 NOTE — ED Notes (Signed)
Pt reports having sob, fatigue, non productive cough and muscle spasms x 2 days. Denies any swelling to extremities. ekg done at triage. spo2 100% at triage.

## 2014-03-09 NOTE — Progress Notes (Signed)
ANTICOAGULATION CONSULT NOTE - Initial Consult  Pharmacy Consult for coumadin Indication: Severe nonischemic cardiomyopathy   No Known Allergies  Patient Measurements: Height: 5\' 7"  (170.2 cm) Weight: 185 lb 14.4 oz (84.324 kg) IBW/kg (Calculated) : 66.1 Heparin Dosing Weight:   Vital Signs: Temp: 97.5 F (36.4 C) (04/14 1849) Temp src: Oral (04/14 1849) BP: 120/87 mmHg (04/14 1849) Pulse Rate: 75 (04/14 1849)  Labs:  Recent Labs  03/09/14 1311 03/09/14 1442  HGB 15.8  --   HCT 45.0  --   PLT 174  --   LABPROT 18.4*  --   INR 1.58*  --   CREATININE 1.19  --   TROPONINI  --  0.68*    Estimated Creatinine Clearance: 79.7 ml/min (by C-G formula based on Cr of 1.19).   Medical History: Past Medical History  Diagnosis Date  . Ventricular tachycardia   . AICD (automatic cardioverter/defibrillator) present     [employer] 100  . Tobacco abuse   . Alcohol abuse     hx  . Cardiomyopathy     secondary  . HTN (hypertension)     uncontrolled  . CHF (congestive heart failure)   . Gout     Medications:  Scheduled:  . allopurinol  100 mg Oral Daily  . aspirin EC  81 mg Oral Daily  . [START ON 03/10/2014] carvedilol  3.125 mg Oral BID WC  . digoxin  0.25 mg Oral Daily  . furosemide  40 mg Intravenous BID  . lisinopril  10 mg Oral Daily  . sodium chloride  3 mL Intravenous Q12H  . spironolactone  25 mg Oral Daily  . warfarin  10 mg Oral QODAY   Infusions:  . heparin 1,400 Units/hr (03/09/14 1541)    Assessment: 48 yo male with hx of severe nonischemic cardiomyopathy who is currently bridged with heparin will also be resumed on coumadin.  He was on coumadin 10mg  po every other day prior to admission.  Last dose was last week. INR today was 1.58.  Goal of Therapy:  INR 2-3 Monitor platelets by anticoagulation protocol: Yes   Plan:  1) Coumadin 7.5mg  po x1 2) Daily PT/INR  Tsz-Yin Severin Bou 03/09/2014,7:23 PM

## 2014-03-09 NOTE — ED Notes (Signed)
Pt run brief V-tach.

## 2014-03-09 NOTE — ED Notes (Addendum)
CRITICAL VALUE ALERT  Critical value received: Istat Troponin 0.40 ng/ml  Date of notification:  03/09/2014  Time of notification:  1407  Critical value read back:yes  Nurse who received alert:  MG Jenelle Mages, RN   MD notified (1st page):  Dr. Effie Shy  Time of first page:  1407  MD notified (2nd page):  Time of second page:  Responding MD:  Dr. Effie Shy  Time MD responded:  [phone]

## 2014-03-09 NOTE — Progress Notes (Signed)
ANTICOAGULATION CONSULT NOTE - Initial Consult  Pharmacy Consult for heparin Indication: chest pain/ACS  No Known Allergies  Patient Measurements: Height: 5\' 7"  (170.2 cm) Weight: 199 lb 11.2 oz (90.583 kg) IBW/kg (Calculated) : 66.1 Heparin Dosing Weight: 85kg  Vital Signs: BP: 122/92 mmHg (04/14 1530) Pulse Rate: 83 (04/14 1530)  Labs:  Recent Labs  03/09/14 1311  HGB 15.8  HCT 45.0  PLT 174  LABPROT 18.4*  INR 1.58*  CREATININE 1.19    Estimated Creatinine Clearance: 82.4 ml/min (by C-G formula based on Cr of 1.19).   Medical History: Past Medical History  Diagnosis Date  . Ventricular tachycardia   . AICD (automatic cardioverter/defibrillator) present     [employer] 100  . Tobacco abuse   . Alcohol abuse     hx  . Cardiomyopathy     secondary  . HTN (hypertension)     uncontrolled  . CHF (congestive heart failure)   . Gout     Medications:  Infusions:  . heparin    . heparin      Assessment: 47 yom presented to the ED with SOB and weakness. He is on chronic coumadin for hx afib and cardiogenic emboli. However, INR is low at 1.58. H/H and plts are WNL. Troponin is elevated. To start IV heparin for anticoagulation.   Goal of Therapy:  Heparin level 0.3-0.7 units/ml Monitor platelets by anticoagulation protocol: Yes   Plan:  1. Heparin bolus 3000 units IV x 1 (lower bolus since INR is elevated) 2. Heparin gtt 1400 units/hr 3. Check a 6 hour heparin level 4. Daily heparin level and CBC 5. F/u coumadin plans  Drake Leach Mckaylin Bastien 03/09/2014,3:41 PM

## 2014-03-09 NOTE — ED Notes (Signed)
NOTIFIED POD- E RN, MARISELA IN PERSON OF PATIENTS PANIC LAB RESULTS OF I-STAT TROPONIN ,@14 :0-0 PM ,03/09/2014.

## 2014-03-09 NOTE — ED Notes (Signed)
Pharmacy notified that pt need heparin now.

## 2014-03-09 NOTE — Progress Notes (Signed)
ANTICOAGULATION CONSULT NOTE - Follow Up Consult  Pharmacy Consult for Heparin  Indication: chest pain/ACS  No Known Allergies  Patient Measurements: Height: 5\' 7"  (170.2 cm) Weight: 185 lb 14.4 oz (84.324 kg) IBW/kg (Calculated) : 66.1  Vital Signs: Temp: 97.5 F (36.4 C) (04/14 1849) Temp src: Oral (04/14 1849) BP: 126/93 mmHg (04/14 2000) Pulse Rate: 75 (04/14 2000)  Labs:  Recent Labs  03/09/14 1311 03/09/14 1442 03/09/14 2020 03/09/14 2220  HGB 15.8  --   --  15.4  HCT 45.0  --   --  44.6  PLT 174  --   --  173  APTT  --   --   --  62*  LABPROT 18.4*  --   --  17.4*  INR 1.58*  --   --  1.47  HEPARINUNFRC  --   --   --  0.44  CREATININE 1.19  --   --  1.20  TROPONINI  --  0.68* 0.71*  0.70*  --     Estimated Creatinine Clearance: 79 ml/min (by C-G formula based on Cr of 1.2).   Medications:  Heparin 1400 units/hr  Assessment: 48 y/o M on heparin for +troponin. HL is 0.44. Other labs as above.  Goal of Therapy:  Heparin level 0.3-0.7 units/ml Monitor platelets by anticoagulation protocol: Yes   Plan:  -Continue heparin at 1400 units/hr -AM HL -Daily CBC/HL -Monitor for bleeding  Latray Surrency 03/09/2014,11:14 PM

## 2014-03-09 NOTE — H&P (Signed)
Parker Johnson is an 48 y.o. male.   Chief Complaint: Progressive increasing shortness of breath associated with loss of appetite and no energy. 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 Patient also gives history of PND orthopnea but denies any leg swelling. Denies any noncompliance to medication except states he missed Coumadin yesterday. Denies any palpitation lightheadedness or syncope. Patient was noted to have nonsustained wide complex tachycardia in the ED patient denies any ICD discharges. States his ICD is being monitored every weekly over the phone. Patient denies any fever or chills sore throat but complaints of cough with lying down. In ED patient was noted to have elevated pro BNP was 6000 and minimally elevated troponin I. Patient denies any chest pains nausea vomiting or diaphoresis.  Past Medical History  Diagnosis Date  . Ventricular tachycardia   . AICD (automatic cardioverter/defibrillator) present     [employer] 100  . Tobacco abuse   . Alcohol abuse     hx  . Cardiomyopathy     secondary  . HTN (hypertension)     uncontrolled  . CHF (congestive heart failure)   . Gout     Past Surgical History  Procedure Laterality Date  . Cardiac defibrillator placement  2010    [employer] 100  . Embolectomy Right 06/04/2013    Procedure: Upper Extremity Thrombectomy;  Surgeon: Serafina Mitchell, MD;  Location: [facility] OR;  Service: Vascular;  Laterality: Right;    Family History  Problem Relation Age of Onset  . Cardiomyopathy Father     on a defib  . Diabetes Father    Social History:  reports that he has been smoking Cigarettes.  He has a 15 pack-year smoking history. He has never used smokeless tobacco. He reports that he drinks about .6 ounces of alcohol per week. He reports that he does not use illicit drugs.  Allergies: No Known Allergies   (Not in a hospital admission)  Results for orders placed during the hospital encounter of 03/09/14 (from the past 48 hour(s))  CBC     Status: Abnormal   Collection Time    03/09/14  1:11 PM      Result Value Ref Range   WBC 6.5  4.0 - 10.5 K/uL   RBC 4.93  4.22 - 5.81 MIL/uL   Hemoglobin 15.8  13.0 - 17.0 g/dL   HCT 45.0  39.0 - 52.0 %   MCV 91.3  78.0 - 100.0 fL   MCH 32.0  26.0 - 34.0 pg   MCHC 35.1  30.0 - 36.0 g/dL   RDW 16.5 (*) 11.5 - 15.5 %   Platelets 174  150 - 400 K/uL  BASIC METABOLIC PANEL     Status: Abnormal   Collection Time    03/09/14  1:11 PM      Result Value Ref Range   Sodium 138  137 -  147 mEq/L   Potassium 4.5  3.7 - 5.3 mEq/L   Chloride 97  96 - 112 mEq/L   CO2 27  19 - 32 mEq/L   Glucose, Bld 111 (*) 70 - 99 mg/dL   BUN 18  6 - 23 mg/dL   Creatinine, Ser 1.19  0.50 - 1.35 mg/dL   Calcium 9.7  8.4 - 10.5 mg/dL   GFR calc non Af Amer 71 (*) >90 mL/min   GFR calc Af Amer 83 (*) >90 mL/min   Comment: (NOTE)     The eGFR has been calculated using the CKD EPI equation.     This calculation has not been validated in all clinical situations.     eGFR's persistently <90 mL/min signify possible Chronic Kidney     Disease.  PROTIME-INR     Status: Abnormal   Collection Time    03/09/14  1:11 PM      Result Value Ref Range   Prothrombin Time 18.4 (*) 11.6 - 15.2 seconds   INR 1.58 (*) 0.00  - 1.49  PRO B NATRIURETIC PEPTIDE     Status: Abnormal   Collection Time    03/09/14  1:12 PM      Result Value Ref Range   Pro B Natriuretic peptide (BNP) 6667.0 (*) 0 - 125 pg/mL  I-STAT TROPOININ, ED     Status: Abnormal   Collection Time    03/09/14  1:42 PM      Result Value Ref Range   Troponin i, poc 0.40 (*) 0.00 - 0.08 ng/mL   Comment NOTIFIED PHYSICIAN     Comment 3            Comment: Due to the release kinetics of cTnI,     a negative result within the first hours     of the onset of symptoms does not rule out     myocardial infarction with certainty.     If myocardial infarction is still suspected,     repeat the test at appropriate intervals.  TROPONIN I     Status: Abnormal   Collection Time    03/09/14  2:42 PM      Result Value Ref Range   Troponin I 0.68 (*) <0.30 ng/mL   Comment:            Due to the release kinetics of cTnI,     a negative result within the first hours     of the onset of symptoms does not rule out     myocardial infarction with certainty.     If myocardial infarction is still suspected,     repeat the test at appropriate intervals.     CRITICAL RESULT CALLED TO, READ BACK BY AND VERIFIED WITH:     DR Jimmye Norman [id number] 03/09/14 D RBADLEY   Dg Chest 2 View  03/09/2014   CLINICAL DATA:  Chest pain  EXAM: CHEST  2 VIEW  COMPARISON:  Prior chest x-ray an acute abdominal series 311 2015  FINDINGS: Stable cardiomegaly. Left subclavian approach cardiac rhythm maintenance device. Leads project over the right atrium and right ventricle. Mild vascular congestion without overt edema. No overt pulmonary edema. Central airway thickening similar compared to prior. No pleural effusion or pneumothorax. No acute osseous abnormality.  IMPRESSION: 1. Stable chest x-ray without evidence of acute cardiopulmonary process. 2. Unchanged marked cardiomegaly and vascular congestion without overt edema.   Electronically Signed   By: Jacqulynn Cadet M.D.   On:  03/09/2014  13:56    Review of Systems  Constitutional: Positive for malaise/fatigue. Negative for fever and weight loss.  Eyes: Negative for double vision and photophobia.  Respiratory: Positive for cough and shortness of breath. Negative for hemoptysis and sputum production.   Cardiovascular: Positive for orthopnea and PND. Negative for chest pain and palpitations.  Gastrointestinal: Negative for nausea, vomiting and abdominal pain.  Genitourinary: Negative for dysuria.  Neurological: Positive for dizziness. Negative for headaches.    Blood pressure 118/94, pulse 80, resp. rate 12, height _0  (1.702 m), weight 90.583 kg (199 lb 11.2 oz), SpO2 100.00%. Physical Exam  Constitutional: He is oriented to person, place, and time.  HENT:  Head: Normocephalic and atraumatic.  Eyes: Conjunctivae are normal. Pupils are equal, round, and reactive to light. Left eye exhibits no discharge.  Neck: Normal range of motion. Neck supple. JVD present. No tracheal deviation present. No thyromegaly present.  Cardiovascular: Normal rate and regular rhythm.   Murmur: Soft systolic murmur and S3 gallop noted. Respiratory:  Decrease vessel bases with faint rales  GI: Soft. Bowel sounds are normal. He exhibits no distension. There is no tenderness. There is no rebound and no guarding.  Musculoskeletal:  No clubbing cyanosis trace edema noted  Neurological: He is alert and oriented to person, place, and time.     Assessment/Plan Acute systolic heart failure with minimally elevated troponin I. rule out MI Nonsustained VT Severe nonischemic dilated cardiomyopathy Hypertension History of paroxysmal A. fib in the past History of cardiogenic non-Q-wave MI in the past History of cardiogenic emboli to the right arm status post thrombectomy in July 2014 History of EtOH/tobacco/polysubstance abuse in the past History of gouty arthritis Plan As per orders  Clent Demark 03/09/2014, 4:32 PM

## 2014-03-10 LAB — PROTIME-INR
INR: 1.5 — AB (ref 0.00–1.49)
PROTHROMBIN TIME: 17.7 s — AB (ref 11.6–15.2)

## 2014-03-10 LAB — CBC
HCT: 41.8 % (ref 39.0–52.0)
Hemoglobin: 14.3 g/dL (ref 13.0–17.0)
MCH: 31.2 pg (ref 26.0–34.0)
MCHC: 34.2 g/dL (ref 30.0–36.0)
MCV: 91.3 fL (ref 78.0–100.0)
PLATELETS: 168 10*3/uL (ref 150–400)
RBC: 4.58 MIL/uL (ref 4.22–5.81)
RDW: 16.7 % — ABNORMAL HIGH (ref 11.5–15.5)
WBC: 5.1 10*3/uL (ref 4.0–10.5)

## 2014-03-10 LAB — HEPARIN LEVEL (UNFRACTIONATED): Heparin Unfractionated: 0.3 IU/mL (ref 0.30–0.70)

## 2014-03-10 LAB — BASIC METABOLIC PANEL
BUN: 21 mg/dL (ref 6–23)
CHLORIDE: 99 meq/L (ref 96–112)
CO2: 26 meq/L (ref 19–32)
Calcium: 9.2 mg/dL (ref 8.4–10.5)
Creatinine, Ser: 1.12 mg/dL (ref 0.50–1.35)
GFR calc Af Amer: 89 mL/min — ABNORMAL LOW (ref >90)
GFR calc non Af Amer: 77 mL/min — ABNORMAL LOW (ref >90)
Glucose, Bld: 108 mg/dL — ABNORMAL HIGH (ref 70–99)
Potassium: 4 mEq/L (ref 3.7–5.3)
Sodium: 139 mEq/L (ref 137–147)

## 2014-03-10 LAB — TROPONIN I
TROPONIN I: 0.56 ng/mL — AB (ref <0.30)
Troponin I: 0.41 ng/mL (ref <0.30)

## 2014-03-10 MED ORDER — WARFARIN SODIUM 7.5 MG PO TABS
7.5000 mg | ORAL_TABLET | Freq: Once | ORAL | Status: AC
  Administered 2014-03-10: 7.5 mg via ORAL
  Filled 2014-03-10: qty 1

## 2014-03-10 NOTE — Progress Notes (Signed)
In to see patient at this time, patient having HR's from 45-50's Sinus Huston Foley. Patient is asymptomatic, with SBP 90-100/60-70, 98% 2L. MD Harwani notified of patients most current vital signs, and provided lab values. Will hold Coreg for now. Patient currently back asleep. Will continue to monitor the patient closely.

## 2014-03-10 NOTE — Progress Notes (Signed)
ANTICOAGULATION CONSULT NOTE - Follow-up  Pharmacy Consult for heparin, warfarin Indication: chest pain/ACS, afib, cardiogenic emboli  No Known Allergies  Patient Measurements: Height: 5\' 7"  (170.2 cm) Weight: 185 lb 10 oz (84.2 kg) IBW/kg (Calculated) : 66.1 Heparin Dosing Weight: 85kg  Vital Signs: Temp: 97.4 F (36.3 C) (04/15 0723) Temp src: Oral (04/15 0723) BP: 115/88 mmHg (04/15 0723) Pulse Rate: 72 (04/15 0723)  Labs:  Recent Labs  03/09/14 1311 03/09/14 1442 03/09/14 2020 03/09/14 2220 03/10/14 0240  HGB 15.8  --   --  15.4 14.3  HCT 45.0  --   --  44.6 41.8  PLT 174  --   --  173 168  APTT  --   --   --  62*  --   LABPROT 18.4*  --   --  17.4* 17.7*  INR 1.58*  --   --  1.47 1.50*  HEPARINUNFRC  --   --   --  0.44 0.30  CREATININE 1.19  --   --  1.20 1.12  TROPONINI  --  0.68* 0.71*  0.70*  --  0.56*    Estimated Creatinine Clearance: 84.5 ml/min (by C-G formula based on Cr of 1.12).  Medications:  Infusions:  . heparin 1,400 Units/hr (03/10/14 4037)    Assessment: 47 yom presented to the ED with SOB and weakness. He is on chronic coumadin for hx afib and cardiogenic emboli. However, since INR was low, heparin IV was started. CBC remains WNL and INR is still low at 1.5. No bleeding noted. Heparin level is at goal but has trended down slightly from last level.   Goal of Therapy:  Heparin level 0.3-0.7 units/ml Monitor platelets by anticoagulation protocol: Yes INR 2-3   Plan:  1. Increase heparin gtt to 1450 units/hr 2. Repeat coumadin 7.5mg  PO x 1 tonight 3. F/u AM INR, heparin level and CBC  Parker Johnson, PharmD, BCPS Pager # [phone] 03/10/2014 8:48 AM

## 2014-03-10 NOTE — Progress Notes (Signed)
In to see patient at this time, patient is noted to have a 29 beat run of V-tach. The patient is asymptomatic and was sleeping in the bed when aroused. The patient denies any sob, chest pain, or any abnormal feelings. BP taken was 98/69 with HR 68 NSR, and 99% 2L 02. MD Harwani notified at this time, new orders obtained at this time. Will continue to monitor the patient closely.

## 2014-03-10 NOTE — Progress Notes (Signed)
Utilization Review Completed.  

## 2014-03-10 NOTE — Progress Notes (Signed)
  Echocardiogram 2D Echocardiogram has been performed.  Lorn Junes 03/10/2014, 8:20 AM

## 2014-03-10 NOTE — Progress Notes (Signed)
Subjective:  Patient denies any chest pain states breathing has improved. Denies any palpitations had episode of nonsustained VT and sinus bradycardia earlier. Asymptomatic. No ICD discharges  Objective:  Vital Signs in the last 24 hours: Temp:  [97.4 F (36.3 C)-97.8 F (36.6 C)] 97.8 F (36.6 C) (04/15 1210) Pulse Rate:  [66-90] 69 (04/15 1210) Resp:  [12-31] 29 (04/15 1210) BP: (89-138)/(54-98) 102/74 mmHg (04/15 1210) SpO2:  [91 %-100 %] 98 % (04/15 1210) Weight:  [84.2 kg (185 lb 10 oz)-90.583 kg (199 lb 11.2 oz)] 84.2 kg (185 lb 10 oz) (04/15 0400)  Intake/Output from previous day: 04/14 0701 - 04/15 0700 In: 154 [I.V.:154] Out: 1900 [Urine:1900] Intake/Output from this shift: Total I/O In: 3 [I.V.:3] Out: 700 [Urine:700]  Physical Exam: Neck: no adenopathy, no carotid bruit, no JVD and supple, symmetrical, trachea midline Lungs: Decreased breath sound at bases Heart: regular rate and rhythm, S1, S2 normal and Soft systolic murmur and S3 gallop noted Abdomen: soft, non-tender; bowel sounds normal; no masses,  no organomegaly Extremities: No clubbing cyanosis trace edema  Lab Results:  Recent Labs  03/09/14 2220 03/10/14 0240  WBC 6.8 5.1  HGB 15.4 14.3  PLT 173 168    Recent Labs  03/09/14 2220 03/10/14 0240  NA 138 139  K 4.3 4.0  CL 97 99  CO2 26 26  GLUCOSE 152* 108*  BUN 21 21  CREATININE 1.20 1.12    Recent Labs  03/10/14 0240 03/10/14 0928  TROPONINI 0.56* 0.41*   Hepatic Function Panel  Recent Labs  03/09/14 2220  PROT 7.0  ALBUMIN 3.4*  AST 27  ALT 19  ALKPHOS 107  BILITOT 3.3*   No results found for this basename: CHOL,  in the last 72 hours No results found for this basename: PROTIME,  in the last 72 hours  Imaging: Imaging results have been reviewed and Dg Chest 2 View  03/09/2014   CLINICAL DATA:  Chest pain  EXAM: CHEST  2 VIEW  COMPARISON:  Prior chest x-ray an acute abdominal series 311 2015  FINDINGS: Stable  cardiomegaly. Left subclavian approach cardiac rhythm maintenance device. Leads project over the right atrium and right ventricle. Mild vascular congestion without overt edema. No overt pulmonary edema. Central airway thickening similar compared to prior. No pleural effusion or pneumothorax. No acute osseous abnormality.  IMPRESSION: 1. Stable chest x-ray without evidence of acute cardiopulmonary process. 2. Unchanged marked cardiomegaly and vascular congestion without overt edema.   Electronically Signed   By: Malachy Moan M.D.   On: 03/09/2014 13:56    Cardiac Studies:  Assessment/Plan:  Resolving Acute systolic heart failure  Minimally elevated troponin I.  secondary to above doubt significant MI Nonsustained VT  Severe nonischemic dilated cardiomyopathy  Hypertension  History of paroxysmal A. fib in the past  History of cardiogenic non-Q-wave MI in the past  History of cardiogenic emboli to the right arm status post thrombectomy in July 2014  History of EtOH/tobacco/polysubstance abuse in the past  History of gouty arthritis Plan Check 2-D echo Check labs in a.m.   LOS: 1 day    Robynn Pane 03/10/2014, 12:22 PM

## 2014-03-11 DIAGNOSIS — I4729 Other ventricular tachycardia: Secondary | ICD-10-CM

## 2014-03-11 DIAGNOSIS — I509 Heart failure, unspecified: Secondary | ICD-10-CM

## 2014-03-11 DIAGNOSIS — I429 Cardiomyopathy, unspecified: Secondary | ICD-10-CM

## 2014-03-11 DIAGNOSIS — I472 Ventricular tachycardia: Secondary | ICD-10-CM

## 2014-03-11 DIAGNOSIS — I5021 Acute systolic (congestive) heart failure: Secondary | ICD-10-CM

## 2014-03-11 DIAGNOSIS — Z95810 Presence of automatic (implantable) cardiac defibrillator: Secondary | ICD-10-CM

## 2014-03-11 LAB — BASIC METABOLIC PANEL
BUN: 21 mg/dL (ref 6–23)
CO2: 26 mEq/L (ref 19–32)
Calcium: 8.7 mg/dL (ref 8.4–10.5)
Chloride: 101 mEq/L (ref 96–112)
Creatinine, Ser: 1.2 mg/dL (ref 0.50–1.35)
GFR, EST AFRICAN AMERICAN: 82 mL/min — AB (ref >90)
GFR, EST NON AFRICAN AMERICAN: 70 mL/min — AB (ref >90)
Glucose, Bld: 100 mg/dL — ABNORMAL HIGH (ref 70–99)
POTASSIUM: 4 meq/L (ref 3.7–5.3)
SODIUM: 139 meq/L (ref 137–147)

## 2014-03-11 LAB — PROTIME-INR
INR: 1.47 (ref 0.00–1.49)
PROTHROMBIN TIME: 17.4 s — AB (ref 11.6–15.2)

## 2014-03-11 LAB — CBC
HCT: 41.1 % (ref 39.0–52.0)
Hemoglobin: 14 g/dL (ref 13.0–17.0)
MCH: 31 pg (ref 26.0–34.0)
MCHC: 34.1 g/dL (ref 30.0–36.0)
MCV: 91.1 fL (ref 78.0–100.0)
PLATELETS: 165 10*3/uL (ref 150–400)
RBC: 4.51 MIL/uL (ref 4.22–5.81)
RDW: 16.7 % — AB (ref 11.5–15.5)
WBC: 6.2 10*3/uL (ref 4.0–10.5)

## 2014-03-11 LAB — PRO B NATRIURETIC PEPTIDE: PRO B NATRI PEPTIDE: 3626 pg/mL — AB (ref 0–125)

## 2014-03-11 LAB — HEPARIN LEVEL (UNFRACTIONATED): HEPARIN UNFRACTIONATED: 0.31 [IU]/mL (ref 0.30–0.70)

## 2014-03-11 MED ORDER — WARFARIN SODIUM 10 MG PO TABS
10.0000 mg | ORAL_TABLET | Freq: Once | ORAL | Status: AC
  Administered 2014-03-11: 10 mg via ORAL
  Filled 2014-03-11: qty 1

## 2014-03-11 NOTE — Progress Notes (Signed)
Subjective:  Patient denies any chest pain states breathing has improved denies any palpitations. Noted to have nonsustained VT on the monitor and occasional bradycardia carvedilol was held yesterday.  Objective:  Vital Signs in the last 24 hours: Temp:  [97.6 F (36.4 C)-98.3 F (36.8 C)] 98.3 F (36.8 C) (04/16 0746) Pulse Rate:  [50-82] 76 (04/16 0929) Resp:  [12-31] 19 (04/16 0746) BP: (87-113)/(53-85) 111/84 mmHg (04/16 0929) SpO2:  [89 %-100 %] 94 % (04/16 0746) Weight:  [83.5 kg (184 lb 1.4 oz)] 83.5 kg (184 lb 1.4 oz) (04/16 0500)  Intake/Output from previous day: 04/15 0701 - 04/16 0700 In: 293.4 [I.V.:293.4] Out: 2900 [Urine:2900] Intake/Output from this shift: Total I/O In: 3 [I.V.:3] Out: 375 [Urine:375]  Physical Exam: Neck: no adenopathy, no carotid bruit, no JVD and supple, symmetrical, trachea midline Lungs: Decreased breath sound at bases air entry improved Heart: regular rate and rhythm, S1, S2 normal and Soft systolic murmur noted Abdomen: soft, non-tender; bowel sounds normal; no masses,  no organomegaly Extremities: extremities normal, atraumatic, no cyanosis or edema  Lab Results:  Recent Labs  03/10/14 0240 03/11/14 0236  WBC 5.1 6.2  HGB 14.3 14.0  PLT 168 165    Recent Labs  03/10/14 0240 03/11/14 0236  NA 139 139  K 4.0 4.0  CL 99 101  CO2 26 26  GLUCOSE 108* 100*  BUN 21 21  CREATININE 1.12 1.20    Recent Labs  03/10/14 0240 03/10/14 0928  TROPONINI 0.56* 0.41*   Hepatic Function Panel  Recent Labs  03/09/14 2220  PROT 7.0  ALBUMIN 3.4*  AST 27  ALT 19  ALKPHOS 107  BILITOT 3.3*   No results found for this basename: CHOL,  in the last 72 hours No results found for this basename: PROTIME,  in the last 72 hours  Imaging: Imaging results have been reviewed and Dg Chest 2 View  03/09/2014   CLINICAL DATA:  Chest pain  EXAM: CHEST  2 VIEW  COMPARISON:  Prior chest x-ray an acute abdominal series 311 2015  FINDINGS:  Stable cardiomegaly. Left subclavian approach cardiac rhythm maintenance device. Leads project over the right atrium and right ventricle. Mild vascular congestion without overt edema. No overt pulmonary edema. Central airway thickening similar compared to prior. No pleural effusion or pneumothorax. No acute osseous abnormality.  IMPRESSION: 1. Stable chest x-ray without evidence of acute cardiopulmonary process. 2. Unchanged marked cardiomegaly and vascular congestion without overt edema.   Electronically Signed   By: Malachy Moan M.D.   On: 03/09/2014 13:56    Cardiac Studies:  Assessment/Plan:  Resolving Acute systolic heart failure  Minimally elevated troponin I. secondary to above doubt significant MI  Nonsustained VT  Severe nonischemic dilated cardiomyopathy  Hypertension  History of paroxysmal A. fib in the past  History of cardiogenic non-Q-wave MI in the past  History of cardiogenic emboli to the right arm status post thrombectomy in July 2014  History of EtOH/tobacco/polysubstance abuse in the past  History of gouty arthritis  Plan Continue present management Increase ACE inhibitors and beta blockers as tolerated We'll get EP consult for ICD evaluation and possible CRT  LOS: 2 days    Parker Johnson 03/11/2014, 9:35 AM

## 2014-03-11 NOTE — Consult Note (Signed)
ELECTROPHYSIOLOGY CONSULT NOTE   Patient ID: Parker Johnson MRN: [id number], DOB/AGE: 48-Oct-1967   Admit date: 03/09/2014 Date of Consult: 03/11/2014  Primary Cardiologist: Rinaldo Cloud, MD Primary EP: Berton Mount, MD Reason for Consultation: Recommendations regarding upgrade to CRT device  History of Present Illness Parker Johnson is a 48 y.o. male with a longstanding NICM, EF 10-15%, s/p dual chamber ICD implant (participant in MADIT-RIT trial), chronic systolic HF, paroxysmal VT, prior polysubstance abuse and HTN who has been admitted with acute on chronic systolic HF. EP has been asked to provide recommendations regarding whether or not he is a CRT candidate. [name] reports chronic DOE and is still working, mowing lawns. He reports his DOE has worsened over the weekend. He noticed this while mowing grass. It was a usual job for him, about 3/4 acre that he usually push mows in about one hour and fifteen minutes; however, on Saturday this job took him two hours to complete due to stopping frequently to catch his breath. He has also had orthopnea but denies LE or abdominal swelling. He denies CP. He denies palpitations, dizziness, syncope or ICD shocks. He reports compliance with his medications and low sodium diet.   Past Medical History Past Medical History  Diagnosis Date  . Ventricular tachycardia   . AICD (automatic cardioverter/defibrillator) present     [employer] 100  . Tobacco abuse   . Alcohol abuse     hx  . Cardiomyopathy     secondary  . HTN (hypertension)     uncontrolled  . CHF (congestive heart failure)   . Gout     Past Surgical History Past Surgical History  Procedure Laterality Date  . Cardiac defibrillator placement  2010    [employer] 100  . Embolectomy Right 06/04/2013    Procedure: Upper Extremity Thrombectomy;  Surgeon: Nada Libman, MD;  Location: [facility] OR;  Service: Vascular;  Laterality: Right;      Allergies/Intolerances No Known Allergies  Current Home Medications      allopurinol 100 MG tablet  Commonly known as:  ZYLOPRIM  Take 100 mg by mouth daily.     carvedilol 6.25 MG tablet  Commonly known as:  COREG  Take 6.25 mg by mouth 2 (two) times daily with a meal.     digoxin 0.25 MG tablet  Commonly known as:  LANOXIN  Take 0.25 mg by mouth daily.     furosemide 40 MG tablet  Commonly known as:  LASIX  Take 40 mg by mouth 2 (two) times daily.     lisinopril 20 MG tablet  Commonly known as:  PRINIVIL,ZESTRIL  Take 20 mg by mouth daily.     OMEGA 3 PO  Take 1 tablet by mouth daily.     spironolactone 25 MG tablet  Commonly known as:  ALDACTONE  Take 25 mg by mouth daily.     VITAMIN B 12 PO  Take 1 tablet by mouth daily.     warfarin 10 MG tablet  Commonly known as:  COUMADIN  Take 10 mg by mouth every other day.     Inpatient Medications . allopurinol  100 mg Oral Daily  . aspirin EC  81 mg Oral Daily  . carvedilol  3.125 mg Oral BID WC  . digoxin  0.25 mg Oral Daily  . furosemide  40 mg Intravenous BID  . lisinopril  10 mg Oral Daily  . sodium chloride  3 mL Intravenous Q12H  .  spironolactone  25 mg Oral Daily  . warfarin  10 mg Oral ONCE-1800  . Warfarin - Pharmacist Dosing Inpatient   Does not apply q1800   . heparin 1,450 Units/hr (03/11/14 1246)    Family History Family History  Problem Relation Age of Onset  . Cardiomyopathy Father     on a defib  . Diabetes Father      Social History History   Social History  . Marital Status: Married    Spouse Name: N/A    Number of Children: N/A  . Years of Education: N/A   Occupational History  . Not on file.   Social History Main Topics  . Smoking status: Current Every Day Smoker -- 0.50 packs/day for 30 years    Types: Cigarettes  . Smokeless tobacco: Never Used     Comment: I already have information on quitting"  . Alcohol Use: 0.6 oz/week    1 Cans of beer per week  . Drug Use:  No     Comment: history of cocaine use that is not very recent (1-2 yrs)  . Sexual Activity: Yes    Birth Control/ Protection: None   Other Topics Concern  . Not on file   Social History Narrative   Disabled. Did not finish the 12th grade but is interested in pursuing a GED.      Review of Systems General: No chills, fever, night sweats or weight changes  Cardiovascular:  No chest pain, dyspnea on exertion, edema, orthopnea, palpitations, paroxysmal nocturnal dyspnea Dermatological: No rash, lesions or masses Respiratory: No cough, dyspnea Urologic: No hematuria, dysuria Abdominal: No nausea, vomiting, diarrhea, bright red blood per rectum, melena, or hematemesis Neurologic: No visual changes, weakness, changes in mental status All other systems reviewed and are otherwise negative except as noted above.  Physical Exam Vitals: Blood pressure 91/58, pulse 73, temperature 97.6 F (36.4 C), temperature source Oral, resp. rate 24, height 5\' 7"  (1.702 m), weight 184 lb 1.4 oz (83.5 kg), SpO2 98.00%.  General: Well developed, well appearing 48 y.o. male in no acute distress. HEENT: Normocephalic, atraumatic. EOMs intact. Sclera nonicteric. Oropharynx clear.  Neck: Supple. No JVD. Lungs: Respirations regular and unlabored, CTA bilaterally. No wheezes, rales or rhonchi. Heart: RRR. S1, S2 present. No murmurs, rub, S3 or S4. Abdomen: Soft, non-tender, non-distended. BS present x 4 quadrants. No hepatosplenomegaly.  Extremities: No clubbing, cyanosis or edema. DP/PT/Radials 2+ and equal bilaterally. Psych: Normal affect. Neuro: Alert and oriented X 3. Moves all extremities spontaneously. Musculoskeletal: No kyphosis. Skin: Intact. Warm and dry. No rashes or petechiae in exposed areas.   Labs  Recent Labs  03/09/14 1442 03/09/14 2020 03/10/14 0240 03/10/14 0928  TROPONINI 0.68* 0.71*  0.70* 0.56* 0.41*   Lab Results  Component Value Date   WBC 6.2 03/11/2014   HGB 14.0 03/11/2014    HCT 41.1 03/11/2014   MCV 91.1 03/11/2014   PLT 165 03/11/2014    Recent Labs Lab 03/09/14 2220  03/11/14 0236  NA 138  < > 139  K 4.3  < > 4.0  CL 97  < > 101  CO2 26  < > 26  BUN 21  < > 21  CREATININE 1.20  < > 1.20  CALCIUM 9.5  < > 8.7  PROT 7.0  --   --   BILITOT 3.3*  --   --   ALKPHOS 107  --   --   ALT 19  --   --   AST  27  --   --   GLUCOSE 152*  < > 100*  < > = values in this interval not displayed.   Recent Labs  03/09/14 2020  TSH 1.910    Recent Labs  03/11/14 0236  INR 1.47    Radiology/Studies Dg Chest 2 View 03/09/2014    FINDINGS: Stable cardiomegaly. Left subclavian approach cardiac rhythm maintenance device. Leads project over the right atrium and right ventricle. Mild vascular congestion without overt edema. No overt pulmonary edema. Central airway thickening similar compared to prior. No pleural effusion or pneumothorax. No acute osseous abnormality.   IMPRESSION: 1. Stable chest x-ray without evidence of acute cardiopulmonary process. 2. Unchanged marked cardiomegaly and vascular congestion without overt edema.    Electronically Signed   By: Malachy Moan M.D.   On: 03/09/2014 13:56   Echocardiogram 03/10/2014  Study Conclusions - Left ventricle: The cavity size was moderately dilated. The estimated ejection fraction was 10%. Wall motion was normal; there were no regional wall motion abnormalities. - Left atrium: The atrium was mildly dilated. - Atrial septum: No defect or patent foramen ovale was identified. - Pulmonary arteries: PA peak pressure: 75mm Hg (S).  12-lead ECG - NSR; PR 160, QRS 91, QTc 472 Device interrogation - performed today at the bedside - Normal ICD function with good battery status and stable lead measurements with normal impedances, sensing and thresholds. Battery longevity 7 years. NSVT noted which is not new for him. No VT/VF therapies.   Assessment and Plan 1. NICM, EF 10%, s/p dual chamber ICD implant  previously 2. Acute on chronic systolic HF 3. Paroxysmal VT 4. Prior polysubstance abuse Mr. Loveday presents with acute on chronic systolic HF. LVEF remains severely depressed at 10%. Functionally appears better than that LVEF. However, he has a narrow QRS complex and does not meet criteria for CRT. Continue BB and up-titrate as BP will allow for treatment of VT. Consider adding hydralazine and nitrates if BP will allow to maximize medical therapy. Continue routine remote ICD follow-up in addition to clinic follow-up with primary EP, Dr. Graciela Husbands, as scheduled.  Signed, Minda Meo, PA-C 03/11/2014, 4:34 PM  I have seen, examined the patient, and reviewed the above assessment and plan.  Changes to above are made where necessary.  ICD interrogation today is reviewed in detail (see paper chart).  This reveals normal device function.  No sustained arrhythmias requiring therapy.  VP < 1%.  As his QRS < 130 msec, he does not meet criteria for device upgrade at this time.   No further EP evaluation planned.  Electrophysiology team to see as needed while here. Please call with questions.   Co Sign: Hillis Range, MD 03/11/2014 4:57 PM

## 2014-03-11 NOTE — Progress Notes (Signed)
ANTICOAGULATION CONSULT NOTE - Follow-up  Pharmacy Consult for heparin, warfarin Indication: chest pain/ACS, afib, cardiogenic emboli  No Known Allergies  Patient Measurements: Height: 5\' 7"  (170.2 cm) Weight: 184 lb 1.4 oz (83.5 kg) IBW/kg (Calculated) : 66.1 Heparin Dosing Weight: 85kg  Vital Signs: Temp: 98.4 F (36.9 C) (04/16 1128) Temp src: Oral (04/16 1128) BP: 103/71 mmHg (04/16 1128) Pulse Rate: 85 (04/16 1128)  Labs:  Recent Labs  03/09/14 2020 03/09/14 2220 03/10/14 0240 03/10/14 0928 03/11/14 0236  HGB  --  15.4 14.3  --  14.0  HCT  --  44.6 41.8  --  41.1  PLT  --  173 168  --  165  APTT  --  62*  --   --   --   LABPROT  --  17.4* 17.7*  --  17.4*  INR  --  1.47 1.50*  --  1.47  HEPARINUNFRC  --  0.44 0.30  --  0.31  CREATININE  --  1.20 1.12  --  1.20  TROPONINI 0.71*  0.70*  --  0.56* 0.41*  --     Estimated Creatinine Clearance: 78.7 ml/min (by C-G formula based on Cr of 1.2).  Medications:  Infusions:  . heparin 1,450 Units/hr (03/11/14 1246)    Assessment: 47 yom presented to the ED with SOB and weakness. He is on chronic coumadin for hx afib and cardiogenic emboli. However, since INR was low, heparin IV was started. CBC remains WNL and INR is still low at 1.5. No bleeding noted.   Goal of Therapy:  Heparin level 0.3-0.7 units/ml Monitor platelets by anticoagulation protocol: Yes INR 2-3   Plan:  1. Heparin drip at 1450 units/hr 2. Coumadin 10 mg po x 1 3. F/u AM INR, heparin level and CBC  Thank you. Okey Regal, PharmD [phone]   03/11/2014 12:57 PM

## 2014-03-12 LAB — PROTIME-INR
INR: 1.63 — ABNORMAL HIGH (ref 0.00–1.49)
Prothrombin Time: 18.9 seconds — ABNORMAL HIGH (ref 11.6–15.2)

## 2014-03-12 LAB — CBC
HCT: 44.1 % (ref 39.0–52.0)
HEMOGLOBIN: 14.6 g/dL (ref 13.0–17.0)
MCH: 30.9 pg (ref 26.0–34.0)
MCHC: 33.1 g/dL (ref 30.0–36.0)
MCV: 93.2 fL (ref 78.0–100.0)
Platelets: 169 10*3/uL (ref 150–400)
RBC: 4.73 MIL/uL (ref 4.22–5.81)
RDW: 17.1 % — ABNORMAL HIGH (ref 11.5–15.5)
WBC: 5.5 10*3/uL (ref 4.0–10.5)

## 2014-03-12 LAB — BASIC METABOLIC PANEL
BUN: 18 mg/dL (ref 6–23)
CALCIUM: 8.7 mg/dL (ref 8.4–10.5)
CO2: 30 mEq/L (ref 19–32)
Chloride: 99 mEq/L (ref 96–112)
Creatinine, Ser: 1.1 mg/dL (ref 0.50–1.35)
GFR calc Af Amer: >90 mL/min (ref >90)
GFR, EST NON AFRICAN AMERICAN: 78 mL/min — AB (ref >90)
GLUCOSE: 87 mg/dL (ref 70–99)
Potassium: 4.1 mEq/L (ref 3.7–5.3)
Sodium: 141 mEq/L (ref 137–147)

## 2014-03-12 LAB — HEPARIN LEVEL (UNFRACTIONATED): Heparin Unfractionated: 0.35 IU/mL (ref 0.30–0.70)

## 2014-03-12 MED ORDER — CARVEDILOL 6.25 MG PO TABS
6.2500 mg | ORAL_TABLET | Freq: Two times a day (BID) | ORAL | Status: DC
Start: 2014-03-12 — End: 2014-03-14
  Administered 2014-03-12 – 2014-03-14 (×4): 6.25 mg via ORAL
  Filled 2014-03-12 (×6): qty 1

## 2014-03-12 MED ORDER — WARFARIN SODIUM 10 MG PO TABS
10.0000 mg | ORAL_TABLET | Freq: Once | ORAL | Status: AC
  Administered 2014-03-12: 10 mg via ORAL
  Filled 2014-03-12: qty 1

## 2014-03-12 NOTE — Care Management Note (Signed)
    Page 1 of 1   03/12/2014     3:04:07 PM CARE MANAGEMENT NOTE 03/12/2014  Patient:  DONYALE, ARCARI   Account Number:  [id number]  Date Initiated:  03/10/2014  Documentation initiated by:  Kearston Putman  Subjective/Objective Assessment:   dx systolic failure, r/o MI; lives with spouse    PCP  Dr Rinaldo Cloud     DC Planning Services  CM consult      [facility] arranged  HH - 11 Patient Refused      Per UR Regulation:  Reviewed for med. necessity/level of care/duration of stay  Comments:  03/12/14 1320 Arn Mcomber RN MSN BSN CCM Received heart failure home health screen, discussed home health services with pt.  Pt states he is active, is not homebound, and does not want services.

## 2014-03-12 NOTE — Progress Notes (Signed)
Subjective:  Patient denies any chest pain or shortness of breath. Denies any palpitations. Noted to have occasional nonsustained VT asymptomatic. Appreciate EP consult and help  Objective:  Vital Signs in the last 24 hours: Temp:  [97.3 F (36.3 C)-97.7 F (36.5 C)] 97.6 F (36.4 C) (04/17 0700) Pulse Rate:  [65-78] 71 (04/17 0900) Resp:  [12-27] 12 (04/17 0900) BP: (91-115)/(58-86) 115/86 mmHg (04/17 0900) SpO2:  [93 %-99 %] 94 % (04/17 0900) Weight:  [83.87 kg (184 lb 14.4 oz)] 83.87 kg (184 lb 14.4 oz) (04/17 0400)  Intake/Output from previous day: 04/16 0701 - 04/17 0700 In: 711 [P.O.:240; I.V.:471] Out: 3335 [Urine:3335] Intake/Output from this shift: Total I/O In: 389 [P.O.:360; I.V.:29] Out: 575 [Urine:575]  Physical Exam: Neck: no adenopathy, no carotid bruit, no JVD and supple, symmetrical, trachea midline Lungs: clear to auscultation bilaterally Heart: regular rate and rhythm, S1, S2 normal and Soft systolic murmur and S3 gallop noted Abdomen: soft, non-tender; bowel sounds normal; no masses,  no organomegaly Extremities: extremities normal, atraumatic, no cyanosis or edema  Lab Results:  Recent Labs  03/11/14 0236 03/12/14 0331  WBC 6.2 5.5  HGB 14.0 14.6  PLT 165 169    Recent Labs  03/11/14 0236 03/12/14 0331  NA 139 141  K 4.0 4.1  CL 101 99  CO2 26 30  GLUCOSE 100* 87  BUN 21 18  CREATININE 1.20 1.10    Recent Labs  03/10/14 0240 03/10/14 0928  TROPONINI 0.56* 0.41*   Hepatic Function Panel  Recent Labs  03/09/14 2220  PROT 7.0  ALBUMIN 3.4*  AST 27  ALT 19  ALKPHOS 107  BILITOT 3.3*   No results found for this basename: CHOL,  in the last 72 hours No results found for this basename: PROTIME,  in the last 72 hours  Imaging: Imaging results have been reviewed and No results found.  Cardiac Studies:  Assessment/Plan:  Resolving Acute systolic heart failure  Minimally elevated troponin I. secondary to above doubt  significant MI  Nonsustained VT  Severe nonischemic dilated cardiomyopathy  Hypertension  History of paroxysmal A. 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

## 2014-03-12 NOTE — Clinical Documentation Improvement (Signed)
Possible Clinical Conditions?  NSTEMI type 2 secondary to Systolic heart failure exacerbation Other Condition Supporting Information: Risk Factors:CHF, NICM Signs & Symptoms:SOB, VT, Fatigue, Weakness Diagnostics:Troponins: 4/14 to 03/10/2014 0.68, 0.70, 0.71, 0.56, 0.41 Treatment:monitor, evaluate, supportive treatment Thank You, Amada Kingfisher ,RN Clinical Documentation Specialist:  [phone]  [facility]- Health Information Management

## 2014-03-12 NOTE — Progress Notes (Signed)
ANTICOAGULATION CONSULT NOTE - Follow Up Consult  Pharmacy Consult for heparin and coumadin Indication: chest pain/ACS, afib, cardiogenic emboli   No Known Allergies  Patient Measurements: Height: 5\' 7"  (170.2 cm) Weight: 184 lb 14.4 oz (83.87 kg) IBW/kg (Calculated) : 66.1 Heparin Dosing Weight: 85 kg  Vital Signs: Temp: 97.6 F (36.4 C) (04/17 1155) Temp src: Oral (04/17 1155) BP: 121/75 mmHg (04/17 1155) Pulse Rate: 75 (04/17 1155)  Labs:  Recent Labs  03/09/14 2020  03/09/14 2220 03/10/14 0240 03/10/14 0928 03/11/14 0236 03/12/14 0331  HGB  --   --  15.4 14.3  --  14.0 14.6  HCT  --   --  44.6 41.8  --  41.1 44.1  PLT  --   --  173 168  --  165 169  APTT  --   --  62*  --   --   --   --   LABPROT  --   --  17.4* 17.7*  --  17.4* 18.9*  INR  --   --  1.47 1.50*  --  1.47 1.63*  HEPARINUNFRC  --   < > 0.44 0.30  --  0.31 0.35  CREATININE  --   --  1.20 1.12  --  1.20 1.10  TROPONINI 0.71*  0.70*  --   --  0.56* 0.41*  --   --   < > = values in this interval not displayed.  Estimated Creatinine Clearance: 86 ml/min (by C-G formula based on Cr of 1.1).  Assessment: Patient is a 48 y.o M on anticoagulation for Afib and cardiac emboli with plan to d/c her home when INR is at goal.  Heparin level is at goal today at 0.35 and INR is subtherapeutic but trending up to 1.63.  No bleeding documented.  Goal of Therapy:  INR 2-3 Heparin level 0.3-0.7 units/ml Monitor platelets by anticoagulation protocol: Yes   Plan:  1) coumadin 10mg  PO x1 today 2) continue heparin at 1450 units/hr  Bergen Magner P Rayssa Atha 03/12/2014,1:08 PM

## 2014-03-13 LAB — BASIC METABOLIC PANEL
BUN: 17 mg/dL (ref 6–23)
CO2: 31 meq/L (ref 19–32)
Calcium: 8.8 mg/dL (ref 8.4–10.5)
Chloride: 99 mEq/L (ref 96–112)
Creatinine, Ser: 1.16 mg/dL (ref 0.50–1.35)
GFR calc Af Amer: 85 mL/min — ABNORMAL LOW (ref >90)
GFR calc non Af Amer: 73 mL/min — ABNORMAL LOW (ref >90)
GLUCOSE: 108 mg/dL — AB (ref 70–99)
POTASSIUM: 4.4 meq/L (ref 3.7–5.3)
Sodium: 141 mEq/L (ref 137–147)

## 2014-03-13 LAB — CBC
HCT: 42.4 % (ref 39.0–52.0)
HEMOGLOBIN: 14.5 g/dL (ref 13.0–17.0)
MCH: 31.5 pg (ref 26.0–34.0)
MCHC: 34.2 g/dL (ref 30.0–36.0)
MCV: 92.2 fL (ref 78.0–100.0)
Platelets: 174 10*3/uL (ref 150–400)
RBC: 4.6 MIL/uL (ref 4.22–5.81)
RDW: 17.3 % — ABNORMAL HIGH (ref 11.5–15.5)
WBC: 5.1 10*3/uL (ref 4.0–10.5)

## 2014-03-13 LAB — HEPARIN LEVEL (UNFRACTIONATED): Heparin Unfractionated: 0.5 IU/mL (ref 0.30–0.70)

## 2014-03-13 LAB — PROTIME-INR
INR: 1.86 — ABNORMAL HIGH (ref 0.00–1.49)
Prothrombin Time: 20.9 seconds — ABNORMAL HIGH (ref 11.6–15.2)

## 2014-03-13 LAB — PRO B NATRIURETIC PEPTIDE: Pro B Natriuretic peptide (BNP): 3624 pg/mL — ABNORMAL HIGH (ref 0–125)

## 2014-03-13 MED ORDER — WARFARIN SODIUM 10 MG PO TABS
10.0000 mg | ORAL_TABLET | Freq: Once | ORAL | Status: AC
  Administered 2014-03-13: 10 mg via ORAL
  Filled 2014-03-13: qty 1

## 2014-03-13 NOTE — Progress Notes (Signed)
Subjective:  Patient denies any chest pain or shortness of breath. Denies any palpitations. Occasional nonsustained VT on the monitor asymptomatic no ICD discharges  Objective:  Vital Signs in the last 24 hours: Temp:  [97.3 F (36.3 C)-98.2 F (36.8 C)] 98.2 F (36.8 C) (04/18 0729) Pulse Rate:  [58-83] 61 (04/18 0729) Resp:  [15-32] 22 (04/18 0729) BP: (98-127)/(53-79) 115/79 mmHg (04/18 0729) SpO2:  [93 %-97 %] 93 % (04/18 0729) Weight:  [81.9 kg (180 lb 8.9 oz)] 81.9 kg (180 lb 8.9 oz) (04/18 0429)  Intake/Output from previous day: 04/17 0701 - 04/18 0700 In: 1098.5 [P.O.:535; I.V.:563.5] Out: 2825 [Urine:2825] Intake/Output from this shift:    Physical Exam: Neck: no adenopathy, no carotid bruit, no JVD and supple, symmetrical, trachea midline Lungs: clear to auscultation bilaterally Heart: regular rate and rhythm, S1, S2 normal and Soft systolic murmur or noted Abdomen: soft, non-tender; bowel sounds normal; no masses,  no organomegaly Extremities: extremities normal, atraumatic, no cyanosis or edema  Lab Results:  Recent Labs  03/12/14 0331 03/13/14 0330  WBC 5.5 5.1  HGB 14.6 14.5  PLT 169 174    Recent Labs  03/12/14 0331 03/13/14 0330  NA 141 141  K 4.1 4.4  CL 99 99  CO2 30 31  GLUCOSE 87 108*  BUN 18 17  CREATININE 1.10 1.16   No results found for this basename: TROPONINI, CK, MB,  in the last 72 hours Hepatic Function Panel No results found for this basename: PROT, ALBUMIN, AST, ALT, ALKPHOS, BILITOT, BILIDIR, IBILI,  in the last 72 hours No results found for this basename: CHOL,  in the last 72 hours No results found for this basename: PROTIME,  in the last 72 hours  Imaging: Imaging results have been reviewed and No results found.  Cardiac Studies:  Assessment/Plan:  Resolving Acute systolic heart failure  Probable very small non-Q-wave myocardial infarction type 2 with minimally elevated troponin I. Nonsustained VT  Severe nonischemic  dilated cardiomyopathy  Hypertension  History of paroxysmal A. 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

## 2014-03-13 NOTE — Progress Notes (Signed)
ANTICOAGULATION CONSULT NOTE   Pharmacy Consult for heparin and coumadin Indication: chest pain/ACS, afib, cardiogenic emboli  No Known Allergies  Patient Measurements: Height: 5\' 7"  (170.2 cm) Weight: 180 lb 8.9 oz (81.9 kg) IBW/kg (Calculated) : 66.1 Heparin Dosing Weight: 85 kg  Vital Signs: Temp: 98.2 F (36.8 C) (04/18 0729) Temp src: Oral (04/18 0729) BP: 115/79 mmHg (04/18 0729) Pulse Rate: 61 (04/18 0729)  Labs:  Recent Labs  03/10/14 0928  03/11/14 0236 03/12/14 0331 03/13/14 0330  HGB  --   < > 14.0 14.6 14.5  HCT  --   --  41.1 44.1 42.4  PLT  --   --  165 169 174  LABPROT  --   --  17.4* 18.9* 20.9*  INR  --   --  1.47 1.63* 1.86*  HEPARINUNFRC  --   --  0.31 0.35 0.50  CREATININE  --   --  1.20 1.10 1.16  TROPONINI 0.41*  --   --   --   --   < > = values in this interval not displayed.  Estimated Creatinine Clearance: 80.6 ml/min (by C-G formula based on Cr of 1.16).  Assessment: Patient is a 48 y.o M on anticoagulation for Afib and cardiac emboli with plan to d/c her home when INR is at goal. Heparin level is at goal today at 0.5 and INR is subtherapeutic but trending up to 1.8.  No bleeding documented.  Goal of Therapy:  INR 2-3 Heparin level 0.3-0.7 units/ml Monitor platelets by anticoagulation protocol: Yes   Plan:  1) coumadin 10mg  PO x1 today 2) continue heparin at 1450 units/hr  [name] PharmD., BCPS Clinical Pharmacist Pager [phone] 03/13/2014 8:35 AM

## 2014-03-14 LAB — HEPARIN LEVEL (UNFRACTIONATED): Heparin Unfractionated: 0.43 IU/mL (ref 0.30–0.70)

## 2014-03-14 LAB — PROTIME-INR
INR: 1.99 — ABNORMAL HIGH (ref 0.00–1.49)
Prothrombin Time: 22 seconds — ABNORMAL HIGH (ref 11.6–15.2)

## 2014-03-14 LAB — CBC
HCT: 44 % (ref 39.0–52.0)
Hemoglobin: 15.2 g/dL (ref 13.0–17.0)
MCH: 31.7 pg (ref 26.0–34.0)
MCHC: 34.5 g/dL (ref 30.0–36.0)
MCV: 91.7 fL (ref 78.0–100.0)
Platelets: 178 10*3/uL (ref 150–400)
RBC: 4.8 MIL/uL (ref 4.22–5.81)
RDW: 17.3 % — ABNORMAL HIGH (ref 11.5–15.5)
WBC: 5.5 10*3/uL (ref 4.0–10.5)

## 2014-03-14 MED ORDER — TORSEMIDE 20 MG PO TABS
20.0000 mg | ORAL_TABLET | Freq: Two times a day (BID) | ORAL | Status: DC
  Filled 2014-03-14 (×3): qty 1

## 2014-03-14 MED ORDER — WARFARIN SODIUM 2.5 MG PO TABS
12.5000 mg | ORAL_TABLET | ORAL | Status: AC
  Administered 2014-03-14: 12.5 mg via ORAL
  Filled 2014-03-14: qty 1

## 2014-03-14 MED ORDER — TORSEMIDE 20 MG PO TABS: 20.0000 mg | ORAL_TABLET | Freq: Two times a day (BID) | ORAL | Status: DC

## 2014-03-14 NOTE — Discharge Summary (Signed)
NAME:  Parker Johnson, Parker Johnson NO.:  [id number]  MEDICAL RECORD NO.:  [id number]  LOCATION:  2C18C                        FACILITY:  [facility]  PHYSICIAN:  Rosaura Bolon N. Sharyn Lull, M.D. DATE OF BIRTH:  [birth year]  DATE OF ADMISSION:  03/09/2014 DATE OF DISCHARGE:  03/14/2014                              DISCHARGE SUMMARY   ADMITTING DIAGNOSES: 1. Acute systolic heart failure with minimally elevated troponin I,     rule out myocardial infarction. 2. Nonsustained ventricular tachycardia. 3. Severe nonischemic dilated cardiomyopathy. 4. Hypertension. 5. History of paroxysmal atrial fibrillation in the past. 6. History of cardiogenic non-Q-wave myocardial infarction in the     past. 7. 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 8. History of EtOH, tobacco, and polysubstance abuse in the past. 9. History of gouty arthritis.  DISCHARGE DIAGNOSES: 1. Status post acute on chronic systolic heart failure. 2. Probable very small non-Q-wave myocardial infarction, type 2 due to     minimally elevated troponin I. 3. Status post nonsustained ventricular tachycardia. 4. Severe nonischemic dilated cardiomyopathy. 5. Hypertension. 6. History of paroxysmal atrial fibrillation in the past. 7. History of cardiogenic non-Q-wave myocardial infarction in the     past. 8. 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 9. History of EtOH, tobacco, and polysubstance abuse in the past. 10.History of gouty arthritis.  DISCHARGE MEDICATIONS: 1. Demadex 20 mg 1 tablet twice daily. 2. Allopurinol 100 mg 1 tablet daily. 3. Carvedilol 6.25 mg twice daily. 4. Digoxin 0.25 mg 1 tablet daily. 5. Lisinopril 20 mg 1 tablet daily. 6. Omega-3, 1 tablet daily. 7. Aldactone 25 mg 1 tablet daily. 8. Vitamin B12 1 tablet daily. 9. Warfarin 10 mg daily.  DIET:  Low salt, low cholesterol.  ACTIVITY:  Increase activity  slowly as tolerated.  Heart failure instructions have been given.  The patient has been advised to monitor weight daily.  Follow up with me in 1 week.  Follow with Dr. Graciela Husbands as scheduled.  CONDITION AT DISCHARGE:  Stable.  BRIEF HISTORY AND HOSPITAL COURSE:  Parker Johnson is a 48 year old male with past medical history significant for 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, history of recurrent congestive heart failure secondary to depressed LV systolic function, hypertension, history of paroxysmal nonsustained VT and paroxysmal atrial fibrillation in the past, history of cardiogenic non-Q-wave myocardial infarction in the past, history of cardio emboli to the right arm status post thrombectomy in July 2014, history of alcohol abuse and polysubstance abuse in the past, history of gouty arthritis.  He came to the ER complaining of progressive increasing shortness of breath associated with loss of appetite, no energy, and feeling weak and tired for the last few days.  The patient also gives history of PND, orthopnea, but denies any leg swelling.  The patient denies any noncompliance to medication except states he missed Coumadin yesterday. The patient denies any palpitation, lightheadedness, or syncope.  The patient was noted to have nonsustained wide-complex tachycardia in the ED.  The patient denies any ICD discharges.  States  that ICD is being monitored every week over the phone.  The patient denies any fever, chills, sore throat, complains of cough with lying down.  In ED, the patient was noted to have elevated proBNP about 6000 and minimally elevated troponin-I.  The patient denies any chest pain, vomiting or diaphoresis.  EKG done in the ER showed no new acute changes.  PAST MEDICAL HISTORY:  As above.  PHYSICAL EXAMINATION:  GENERAL:  His blood pressure was 118/94, pulse was 80, respiratory rate 12, he was afebrile. EYES:  Conjunctivae was  pink. NECK:  Supple.  Positive JVD. LUNGS:  Decreased breath sounds with faint rales. CARDIOVASCULAR:  S1, S2 was soft.  There was soft systolic murmur and S3 gallop. ABDOMEN:  Soft.  Bowel sounds were present. EXTREMITIES:  There was no clubbing, cyanosis.  Trace edema noted.  LABORATORY DATA:  His troponin I was 0.40, second set is 0.68, next set 0.70, next set 0.56, next set 0.41 which is trending down.  ProBNP was 6701, repeat was 3626 which is trending down.  Hemoglobin was 15.4, hematocrit 44.6, white count of 6.8.  Sodium was 138, potassium 4.3, BUN 11, creatinine 1.20.  EKG showed normal sinus rhythm, biatrial enlargement, left anterior fascicular block, poor R-wave progression in V1 to V4.  BRIEF HOSPITAL COURSE:  The patient was admitted to telemetry unit and was started on IV heparin and resumed on his Coumadin, p.o. Lasix was changed to IV Lasix with good diuresis and improvement in his breathing. The patient did not had any episodes of chest pain during the hospital stay, also had episodes of nonsustained VT and occasional sinus bradycardia and hypotension requiring holding off carvedilol and lisinopril.  EP consultation was obtained for possible evaluation for CRT, although the patient had narrow complex QRS.  The patient was felt not a good candidate for CRT at this point.  The patient has been ambulating in room without any problems.  His p.o. Lasix has been changed to Demadex.  The patient will be discharged home on above medications and will be followed up in my office in 1 week.  The patient has been advised to check his PT/INR early next week, and heart failure instructions have been given.  The patient knows compliance with diet, medications, and monitoring his weight daily.     Eduardo OsierMohan N. Sharyn LullHarwani, M.D.     [facility]  D:  03/14/2014  T:  03/14/2014  Job:  [id number]

## 2014-03-14 NOTE — Discharge Instructions (Signed)
Information on my medicine - Coumadin   (Warfarin)  This medication education was reviewed with me or my healthcare representative as part of my discharge preparation.  The pharmacist that spoke with me during my hospital stay was:  L[place], RPH  Why was Coumadin prescribed for you? Coumadin was prescribed for you because you have a blood clot or a medical condition that can cause an increased risk of forming blood clots. Blood clots can cause serious health problems by blocking the flow of blood to the heart, lung, or brain. Coumadin can prevent harmful blood clots from forming. As a reminder your indication for Coumadin is:   Stroke Prevention Because Of Atrial Fibrillation  What test will check on my response to Coumadin? While on Coumadin (warfarin) you will need to have an INR test regularly to ensure that your dose is keeping you in the desired range. The INR (international normalized ratio) number is calculated from the result of the laboratory test called prothrombin time (PT).  If an INR APPOINTMENT HAS NOT ALREADY BEEN MADE FOR YOU please schedule an appointment to have this lab work done by your health care provider within 7 days. Your INR goal is usually a number between:  2 to 3 or your provider may give you a more narrow range like 2-2.5.  Ask your health care provider during an office visit what your goal INR is.  What  do you need to  know  About  COUMADIN? Take Coumadin (warfarin) exactly as prescribed by your healthcare provider about the same time each day.  DO NOT stop taking without talking to the doctor who prescribed the medication.  Stopping without other blood clot prevention medication to take the place of Coumadin may increase your risk of developing a new clot or stroke.  Get refills before you run out.  What do you do if you miss a dose? If you miss a dose, take it as soon as you remember on the same day then continue your regularly scheduled regimen the next day.   Do not take two doses of Coumadin at the same time.  Important Safety Information A possible side effect of Coumadin (Warfarin) is an increased risk of bleeding. You should call your healthcare provider right away if you experience any of the following:   Bleeding from an injury or your nose that does not stop.   Unusual colored urine (red or dark brown) or unusual colored stools (red or black).   Unusual bruising for unknown reasons.   A serious fall or if you hit your head (even if there is no bleeding).  Some foods or medicines interact with Coumadin (warfarin) and might alter your response to warfarin. To help avoid this:   Eat a balanced diet, maintaining a consistent amount of Vitamin K.   Notify your provider about major diet changes you plan to make.   Avoid alcohol or limit your intake to 1 drink for women and 2 drinks for men per day. (1 drink is 5 oz. wine, 12 oz. beer, or 1.5 oz. liquor.)  Make sure that ANY health care provider who prescribes medication for you knows that you are taking Coumadin (warfarin).  Also make sure the healthcare provider who is monitoring your Coumadin knows when you have started a new medication including herbals and non-prescription products.  Coumadin (Warfarin)  Major Drug Interactions  Increased Warfarin Effect Decreased Warfarin Effect  Alcohol (large quantities) Antibiotics (esp. Septra/Bactrim, Flagyl, Cipro) Amiodarone (Cordarone) Aspirin (  ASA) Cimetidine (Tagamet) Megestrol (Megace) NSAIDs (ibuprofen, naproxen, etc.) Piroxicam (Feldene) Propafenone (Rythmol SR) Propranolol (Inderal) Isoniazid (INH) Posaconazole (Noxafil) Barbiturates (Phenobarbital) Carbamazepine (Tegretol) Chlordiazepoxide (Librium) Cholestyramine (Questran) Griseofulvin Oral Contraceptives Rifampin Sucralfate (Carafate) Vitamin K   Coumadin (Warfarin) Major Herbal Interactions  Increased Warfarin Effect Decreased Warfarin Effect  Garlic Ginseng Ginkgo  biloba Coenzyme Q10 Green tea St. Johns wort    Coumadin (Warfarin) FOOD Interactions  Eat a consistent number of servings per week of foods HIGH in Vitamin K (1 serving =  cup)  Collards (cooked, or boiled & drained) Kale (cooked, or boiled & drained) Mustard greens (cooked, or boiled & drained) Parsley *serving size only =  cup Spinach (cooked, or boiled & drained) Swiss chard (cooked, or boiled & drained) Turnip greens (cooked, or boiled & drained)  Eat a consistent number of servings per week of foods MEDIUM-HIGH in Vitamin K (1 serving = 1 cup)  Asparagus (cooked, or boiled & drained) Broccoli (cooked, boiled & drained, or raw & chopped) Brussel sprouts (cooked, or boiled & drained) *serving size only =  cup Lettuce, raw (green leaf, endive, romaine) Spinach, raw Turnip greens, raw & chopped   These websites have more information on Coumadin (warfarin):  http://www.king-russell.com/www.coumadin.com; https://www.hines.net/www.ahrq.gov/consumer/coumadin.htm;   Heart Failure Heart failure means your heart has trouble pumping blood. This makes it hard for your body to work well. Heart failure is usually a long-term (chronic) condition. You must take good care of yourself and follow your doctor's treatment plan. HOME CARE  Take your heart medicine as told by your doctor.  Do not stop taking medicine unless your doctor tells you to.  Do not skip any dose of medicine.  Refill your medicines before they run out.  Take other medicines only as told by your doctor or pharmacist.  Stay active if told by your doctor. The elderly and people with severe heart failure should talk with a doctor about physical activity.  Eat heart healthy foods. Choose foods that are without trans fat and are low in saturated fat, cholesterol, and salt (sodium). This includes fresh or frozen fruits and vegetables, fish, lean meats, fat-free or low-fat dairy foods, whole grains, and high-fiber foods. Lentils and dried peas and beans (legumes) are  also good choices.  Limit salt if told by your doctor.  Cook in a healthy way. Roast, grill, broil, bake, poach, steam, or stir-fry foods.  Limit fluids as told by your doctor.  Weigh yourself every morning. Do this after you pee (urinate) and before you eat breakfast. Write down your weight to give to your doctor.  Take your blood pressure and write it down if your doctor tell you to.  Ask your doctor how to check your pulse. Check your pulse as told.  Lose weight if told by your doctor.  Stop smoking or chewing tobacco. Do not use gum or patches that help you quit without your doctor's approval.  Schedule and go to doctor visits as told.  Nonpregnant women should have no more than 1 drink a day. Men should have no more than 2 drinks a day. Talk to your doctor about drinking alcohol.  Stop illegal drug use.  Stay current with shots (immunizations).  Manage your health conditions as told by your doctor.  Learn to manage your stress.  Rest when you are tired.  If it is really hot outside:  Avoid intense activities.  Use air conditioning or fans, or get in a cooler place.  Avoid caffeine and alcohol.  Wear loose-fitting, lightweight, and light-colored clothing.  If it is really cold outside:  Avoid intense activities.  Layer your clothing.  Wear mittens or gloves, a hat, and a scarf when going outside.  Avoid alcohol.  Learn about heart failure and get support as needed.  Get help to maintain or improve your quality of life and your ability to care for yourself as needed. GET HELP IF:   You gain 03 lb/1.4 kg or more in 1 day or 05 lb/2.3 kg in a week.  You are more short of breath than usual.  You cannot do your normal activities.  You tire easily.  You cough more than normal, especially with activity.  You have any or more puffiness (swelling) in areas such as your hands, feet, ankles, or belly (abdomen).  You cannot sleep because it is hard to  breathe.  You feel like your heart is beating fast (palpitations).  You get dizzy or lightheaded when you stand up. GET HELP RIGHT AWAY IF:   You have trouble breathing.  There is a change in mental status, such as becoming less alert or not being able to focus.  You have chest pain or discomfort.  You faint. MAKE SURE YOU:   Understand these instructions.  Will watch your condition.  Will get help right away if you are not doing well or get worse. Document Released: 08/21/2008 Document Revised: 03/09/2013 Document Reviewed: 06/12/2012 [facility] Patient Information 2014 [place], [place].  Heart Failure Heart failure means your heart has trouble pumping blood. This makes it hard for your body to work well. Heart failure is usually a long-term (chronic) condition. You must take good care of yourself and follow your doctor's treatment plan. HOME CARE  Take your heart medicine as told by your doctor.  Do not stop taking medicine unless your doctor tells you to.  Do not skip any dose of medicine.  Refill your medicines before they run out.  Take other medicines only as told by your doctor or pharmacist.  Stay active if told by your doctor. The elderly and people with severe heart failure should talk with a doctor about physical activity.  Eat heart healthy foods. Choose foods that are without trans fat and are low in saturated fat, cholesterol, and salt (sodium). This includes fresh or frozen fruits and vegetables, fish, lean meats, fat-free or low-fat dairy foods, whole grains, and high-fiber foods. Lentils and dried peas and beans (legumes) are also good choices.  Limit salt if told by your doctor.  Cook in a healthy way. Roast, grill, broil, bake, poach, steam, or stir-fry foods.  Limit fluids as told by your doctor.  Weigh yourself every morning. Do this after you pee (urinate) and before you eat breakfast. Write down your weight to give to your doctor.  Take your blood  pressure and write it down if your doctor tell you to.  Ask your doctor how to check your pulse. Check your pulse as told.  Lose weight if told by your doctor.  Stop smoking or chewing tobacco. Do not use gum or patches that help you quit without your doctor's approval.  Schedule and go to doctor visits as told.  Nonpregnant women should have no more than 1 drink a day. Men should have no more than 2 drinks a day. Talk to your doctor about drinking alcohol.  Stop illegal drug use.  Stay current with shots (immunizations).  Manage your health conditions as told by your doctor.  Learn to  manage your stress.  Rest when you are tired.  If it is really hot outside:  Avoid intense activities.  Use air conditioning or fans, or get in a cooler place.  Avoid caffeine and alcohol.  Wear loose-fitting, lightweight, and light-colored clothing.  If it is really cold outside:  Avoid intense activities.  Layer your clothing.  Wear mittens or gloves, a hat, and a scarf when going outside.  Avoid alcohol.  Learn about heart failure and get support as needed.  Get help to maintain or improve your quality of life and your ability to care for yourself as needed. GET HELP IF:   You gain 03 lb/1.4 kg or more in 1 day or 05 lb/2.3 kg in a week.  You are more short of breath than usual.  You cannot do your normal activities.  You tire easily.  You cough more than normal, especially with activity.  You have any or more puffiness (swelling) in areas such as your hands, feet, ankles, or belly (abdomen).  You cannot sleep because it is hard to breathe.  You feel like your heart is beating fast (palpitations).  You get dizzy or lightheaded when you stand up. GET HELP RIGHT AWAY IF:   You have trouble breathing.  There is a change in mental status, such as becoming less alert or not being able to focus.  You have chest pain or discomfort.  You faint. MAKE SURE YOU:    Understand these instructions.  Will watch your condition.  Will get help right away if you are not doing well or get worse. Document Released: 08/21/2008 Document Revised: 03/09/2013 Document Reviewed: 06/12/2012 [facility] Patient Information 2014 [place], [place].

## 2014-03-14 NOTE — Discharge Summary (Signed)
  Discharge summary dictated on 03/14/2014 dictation number is [phone]

## 2014-03-14 NOTE — Progress Notes (Signed)
ANTICOAGULATION CONSULT NOTE   Pharmacy Consult for heparin and coumadin Indication: chest pain/ACS, afib, cardiogenic emboli  No Known Allergies  Patient Measurements: Height: 5\' 7"  (170.2 cm) Weight: 178 lb 9.2 oz (81 kg) IBW/kg (Calculated) : 66.1 Heparin Dosing Weight: 85 kg  Vital Signs: Temp: 97.5 F (36.4 C) (04/19 0729) Temp src: Oral (04/19 0729) BP: 99/68 mmHg (04/19 0729) Pulse Rate: 65 (04/19 0729)  Labs:  Recent Labs  03/12/14 0331 03/13/14 0330 03/14/14 0504  HGB 14.6 14.5 15.2  HCT 44.1 42.4 44.0  PLT 169 174 178  LABPROT 18.9* 20.9* 22.0*  INR 1.63* 1.86* 1.99*  HEPARINUNFRC 0.35 0.50 0.43  CREATININE 1.10 1.16  --     Estimated Creatinine Clearance: 80.3 ml/min (by C-G formula based on Cr of 1.16).  Assessment: Patient is a 48 y.o M on anticoagulation for Afib and cardiac emboli with plan to d/c her home when INR is at goal. Heparin level is at goal today at 0.4 and INR is just below goal at 1.99.  No bleeding documented.  Warfarin 10mg  daily pta  Goal of Therapy:  INR 2-3 Heparin level 0.3-0.7 units/ml Monitor platelets by anticoagulation protocol: Yes   Plan:  1) coumadin 12.5mg  PO x1 today 2) continue heparin at 1450 units/hr  [name] PharmD., BCPS Clinical Pharmacist Pager [phone] 03/14/2014 7:58 AM

## 2014-03-14 NOTE — Progress Notes (Signed)
Discharge teaching completed and accepted by pt without questions or concerns.  Pt belongings carried by pt home.  Pt was alert and oriented prior to discharge,  vital signs prior to discharge have been documented in Epic

## 2014-05-11 ENCOUNTER — Telehealth: Payer: Self-pay | Admitting: Internal Medicine

## 2014-05-11 NOTE — Telephone Encounter (Signed)
CERTIFIED LETTER SIGNED BY PATRICIA Houseworth 05-01-14/MT

## 2014-06-15 ENCOUNTER — Ambulatory Visit (INDEPENDENT_AMBULATORY_CARE_PROVIDER_SITE_OTHER): Payer: Medicaid Other | Admitting: *Deleted

## 2014-06-15 DIAGNOSIS — I472 Ventricular tachycardia: Secondary | ICD-10-CM

## 2014-06-15 DIAGNOSIS — I4729 Other ventricular tachycardia: Secondary | ICD-10-CM

## 2014-06-15 DIAGNOSIS — I429 Cardiomyopathy, unspecified: Secondary | ICD-10-CM

## 2014-06-16 NOTE — Progress Notes (Signed)
Remote ICD transmission.   

## 2014-06-21 LAB — MDC_IDC_ENUM_SESS_TYPE_REMOTE
Implantable Pulse Generator Serial Number: 143406
Lead Channel Impedance Value: 536 Ohm
Lead Channel Impedance Value: 575 Ohm
Lead Channel Setting Sensing Sensitivity: 0.6 mV
MDC IDC SET LEADCHNL RA PACING AMPLITUDE: 2 V
MDC IDC SET LEADCHNL RV PACING AMPLITUDE: 2.4 V
MDC IDC SET LEADCHNL RV PACING PULSEWIDTH: 0.4 ms
MDC IDC SET ZONE DETECTION INTERVAL: 250 ms
MDC IDC SET ZONE DETECTION INTERVAL: 352.94 ms
Zone Setting Detection Interval: 300 ms

## 2014-06-24 ENCOUNTER — Encounter: Payer: Medicaid Other | Admitting: *Deleted

## 2014-07-03 ENCOUNTER — Emergency Department (HOSPITAL_COMMUNITY)
Admission: EM | Admit: 2014-07-03 | Discharge: 2014-07-03 | Disposition: A | Discharge status: Home / Self Care | Payer: Medicaid Other | Attending: Emergency Medicine | Admitting: Emergency Medicine

## 2014-07-03 ENCOUNTER — Encounter (HOSPITAL_COMMUNITY): Payer: Self-pay | Admitting: Emergency Medicine

## 2014-07-03 ENCOUNTER — Emergency Department (HOSPITAL_COMMUNITY): Payer: Medicaid Other

## 2014-07-03 DIAGNOSIS — I10 Essential (primary) hypertension: Secondary | ICD-10-CM | POA: Insufficient documentation

## 2014-07-03 DIAGNOSIS — Z7982 Long term (current) use of aspirin: Secondary | ICD-10-CM | POA: Insufficient documentation

## 2014-07-03 DIAGNOSIS — Z8659 Personal history of other mental and behavioral disorders: Secondary | ICD-10-CM | POA: Diagnosis not present

## 2014-07-03 DIAGNOSIS — I509 Heart failure, unspecified: Secondary | ICD-10-CM | POA: Diagnosis not present

## 2014-07-03 DIAGNOSIS — R51 Headache: Secondary | ICD-10-CM | POA: Insufficient documentation

## 2014-07-03 DIAGNOSIS — Z79899 Other long term (current) drug therapy: Secondary | ICD-10-CM | POA: Insufficient documentation

## 2014-07-03 DIAGNOSIS — M109 Gout, unspecified: Secondary | ICD-10-CM | POA: Insufficient documentation

## 2014-07-03 DIAGNOSIS — F17200 Nicotine dependence, unspecified, uncomplicated: Secondary | ICD-10-CM | POA: Insufficient documentation

## 2014-07-03 DIAGNOSIS — Z95810 Presence of automatic (implantable) cardiac defibrillator: Secondary | ICD-10-CM | POA: Diagnosis not present

## 2014-07-03 DIAGNOSIS — R519 Headache, unspecified: Secondary | ICD-10-CM

## 2014-07-03 DIAGNOSIS — Z7901 Long term (current) use of anticoagulants: Secondary | ICD-10-CM | POA: Insufficient documentation

## 2014-07-03 LAB — I-STAT CHEM 8, ED
BUN: 28 mg/dL — ABNORMAL HIGH (ref 6–23)
CHLORIDE: 100 meq/L (ref 96–112)
CREATININE: 1.5 mg/dL — AB (ref 0.50–1.35)
Calcium, Ion: 1.11 mmol/L — ABNORMAL LOW (ref 1.12–1.23)
Glucose, Bld: 99 mg/dL (ref 70–99)
HCT: 50 % (ref 39.0–52.0)
Hemoglobin: 17 g/dL (ref 13.0–17.0)
POTASSIUM: 4.1 meq/L (ref 3.7–5.3)
Sodium: 137 mEq/L (ref 137–147)
TCO2: 25 mmol/L (ref 0–100)

## 2014-07-03 LAB — PROTIME-INR
INR: 1.18 (ref 0.00–1.49)
PROTHROMBIN TIME: 15 s (ref 11.6–15.2)

## 2014-07-03 MED ORDER — MORPHINE SULFATE 4 MG/ML IJ SOLN
6.0000 mg | Freq: Once | INTRAMUSCULAR | Status: AC
  Administered 2014-07-03: 6 mg via INTRAVENOUS
  Filled 2014-07-03: qty 2

## 2014-07-03 MED ORDER — ASPIRIN 81 MG PO CHEW: 81.0000 mg | CHEWABLE_TABLET | Freq: Every day | ORAL | Status: DC

## 2014-07-03 MED ORDER — SODIUM CHLORIDE 0.9 % IV BOLUS (SEPSIS)
2000.0000 mL | Freq: Once | INTRAVENOUS | Status: AC
  Administered 2014-07-03: 2000 mL via INTRAVENOUS

## 2014-07-03 NOTE — ED Notes (Signed)
Patient transported to CT 

## 2014-07-03 NOTE — ED Provider Notes (Signed)
CSN: [id number]     Arrival date & time 07/03/14  1839 History   First MD Initiated Contact with Patient 07/03/14 1853     Chief Complaint  Patient presents with  . Headache      HPI Patient presents to the emergency department with left-sided headache.  He tried over-the-counter pain medication without improvement in his symptoms.  He states he had an abscess of his left leg earlier that seems to be improving.  He also had conjunctival symptoms of his left thigh that also have now resolved.  Patient denies weakness of his arms and legs.  No recent injury or trauma.  Patient is on Coumadin.  He has a history of ventricular tachycardia and has an AICD in place.  He denies chest pain shortness of breath.  No abdominal pain.  No nausea or vomiting.  Past Medical History  Diagnosis Date  . Ventricular tachycardia   . AICD (automatic cardioverter/defibrillator) present     [employer] Teligen 100  . Tobacco abuse   . Alcohol abuse     hx  . Cardiomyopathy     secondary  . HTN (hypertension)     uncontrolled  . CHF (congestive heart failure)   . Gout    Past Surgical History  Procedure Laterality Date  . Cardiac defibrillator placement  2010    [employer] tleigen 100  . Embolectomy Right 06/04/2013    Procedure: Upper Extremity Thrombectomy;  Surgeon: Nada LibmanVance W Brabham, MD;  [facility];  Service: Vascular;  Laterality: Right;   Family History  Problem Relation Age of Onset  . Cardiomyopathy Father     on a defib  . Diabetes Father    History  Substance Use Topics  . Smoking status: Current Every Day Smoker -- 0.50 packs/day for 30 years    Types: Cigarettes  . Smokeless tobacco: Never Used     Comment: I already have information on quitting"  . Alcohol Use: 0.6 oz/week    1 Cans of beer per week    Review of Systems  All other systems reviewed and are negative.     Allergies  Review of patient's allergies indicates no known allergies.  Home  Medications   Prior to Admission medications   Medication Sig Start Date End Date Taking? Authorizing Provider  allopurinol (ZYLOPRIM) 100 MG tablet Take 100 mg by mouth daily.   Yes Historical Provider, MD  carvedilol (COREG) 6.25 MG tablet Take 6.25 mg by mouth 2 (two) times daily with a meal.   Yes Historical Provider, MD  digoxin (LANOXIN) 0.25 MG tablet Take 0.25 mg by mouth daily.   Yes Historical Provider, MD  lisinopril (PRINIVIL,ZESTRIL) 20 MG tablet Take 20 mg by mouth daily.     Yes Historical Provider, MD  Omega-3 Fatty Acids (OMEGA 3 PO) Take 1 tablet by mouth daily.   Yes Historical Provider, MD  Polyvinyl Alcohol-Povidone (REFRESH OP) Place 2 drops into both eyes daily as needed (for red, itchy eyes).   Yes Historical Provider, MD  spironolactone (ALDACTONE) 25 MG tablet Take 25 mg by mouth daily.     Yes Historical Provider, MD  torsemide (DEMADEX) 20 MG tablet Take 40 mg by mouth 2 (two) times daily.   Yes Historical Provider, MD  vitamin B-12 (CYANOCOBALAMIN) 1000 MCG tablet Take 1,000 mcg by mouth daily.   Yes Historical Provider, MD  warfarin (COUMADIN) 10 MG tablet Take 10 mg by mouth daily.   Yes Historical Provider, MD  aspirin 81 MG chewable tablet Chew 1 tablet (81 mg total) by mouth daily. 07/03/14   Lyanne Co, MD   BP 120/83  Pulse 80  Temp(Src) 98.2 F (36.8 C) (Oral)  Resp 14  Ht 5\' 7"  (1.702 m)  Wt 180 lb (81.647 kg)  BMI 28.19 kg/m2  SpO2 100% Physical Exam  Nursing note and vitals reviewed. Constitutional: He is oriented to person, place, and time. He appears well-developed and well-nourished.  HENT:  Head: Normocephalic and atraumatic.  Eyes: EOM are normal. Pupils are equal, round, and reactive to light.  Neck: Normal range of motion.  Cardiovascular: Normal rate, regular rhythm, normal heart sounds and intact distal pulses.   Pulmonary/Chest: Effort normal and breath sounds normal. No respiratory distress.  Abdominal: Soft. He exhibits no  distension. There is no tenderness.  Musculoskeletal: Normal range of motion.  Neurological: He is alert and oriented to person, place, and time.  5/5 strength in major muscle groups of  bilateral upper and lower extremities. Speech normal. No facial asymetry.   Skin: Skin is warm and dry.  Psychiatric: He has a normal mood and affect. Judgment normal.    ED Course  Procedures (including critical care time) Labs Review Labs Reviewed  I-STAT CHEM 8, ED - Abnormal; Notable for the following:    BUN 28 (*)    Creatinine, Ser 1.50 (*)    Calcium, Ion 1.11 (*)    All other components within normal limits  PROTIME-INR    Imaging Review Ct Head Wo Contrast  07/03/2014   CLINICAL DATA:  48 year old male with headache.  EXAM: CT HEAD WITHOUT CONTRAST  TECHNIQUE: Contiguous axial images were obtained from the base of the skull through the vertex without intravenous contrast.  COMPARISON:  None.  FINDINGS: Small remote appearing infarcts in the left cerebellum and posterior left parietal region identified.  No acute intracranial abnormalities are identified, including mass lesion or mass effect, hydrocephalus, extra-axial fluid collection, midline shift, hemorrhage, or acute infarction.  The visualized bony calvarium is unremarkable.  IMPRESSION: No evidence of acute intracranial abnormality.  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   Electronically Signed   By: Laveda Abbe M.D.   On: 07/03/2014 20:13  I personally reviewed the imaging tests through PACS system I reviewed available ER/hospitalization records through the EMR    EKG Interpretation None      MDM   Final diagnoses:  Nonintractable headache, unspecified chronicity pattern, unspecified headache type    8:40 PM Patient is feeling better at this time.  Patient's blood pressure looks good.  Home with a short course of pain medicine.  PCP followup.  Head CT because he is on Coumadin.  No signs of  acute intracranial bleeding.  Stigmata of possible prior small ischemic infarct.  Patient be started on a 1 mg aspirin daily for stroke prophylaxis.  Vas that he follow up closely with his primary care physician about ongoing management of stroke risk factors.  Patient understands return to the ER for new or worsening symptoms    Lyanne Co, MD 07/03/14 2040

## 2014-07-03 NOTE — ED Notes (Signed)
Pt c/o headache onset Thursday. Pt has been dealing with abscess to his leg and under his eye recently. Pt has tried OTC meds without relief. Pt denies N/V.

## 2014-07-20 ENCOUNTER — Encounter: Payer: Self-pay | Admitting: Cardiology

## 2014-07-27 ENCOUNTER — Encounter: Payer: Self-pay | Admitting: Internal Medicine

## 2014-07-29 ENCOUNTER — Ambulatory Visit (INDEPENDENT_AMBULATORY_CARE_PROVIDER_SITE_OTHER): Payer: Medicaid Other | Admitting: Internal Medicine

## 2014-07-29 VITALS — BP 112/84 | HR 83 | Ht 67.0 in | Wt 199.0 lb

## 2014-07-29 DIAGNOSIS — I428 Other cardiomyopathies: Secondary | ICD-10-CM

## 2014-07-29 DIAGNOSIS — Z95810 Presence of automatic (implantable) cardiac defibrillator: Secondary | ICD-10-CM

## 2014-07-29 DIAGNOSIS — I509 Heart failure, unspecified: Secondary | ICD-10-CM

## 2014-07-29 LAB — MDC_IDC_ENUM_SESS_TYPE_INCLINIC
HighPow Impedance: 38 Ohm
HighPow Impedance: 62 Ohm
Implantable Pulse Generator Serial Number: 143406
Lead Channel Impedance Value: 495 Ohm
Lead Channel Impedance Value: 538 Ohm
Lead Channel Pacing Threshold Amplitude: 0.4 V
Lead Channel Pacing Threshold Amplitude: 0.7 V
Lead Channel Pacing Threshold Pulse Width: 0.4 ms
Lead Channel Sensing Intrinsic Amplitude: 4.6 mV
Lead Channel Setting Pacing Amplitude: 2 V
Lead Channel Setting Pacing Amplitude: 2.4 V
Lead Channel Setting Pacing Pulse Width: 0.4 ms
Lead Channel Setting Sensing Sensitivity: 0.6 mV
MDC IDC MSMT LEADCHNL RV PACING THRESHOLD PULSEWIDTH: 0.4 ms
MDC IDC MSMT LEADCHNL RV SENSING INTR AMPL: 25 mV — AB
MDC IDC SESS DTM: 20150903040000
MDC IDC SET ZONE DETECTION INTERVAL: 250 ms
MDC IDC SET ZONE DETECTION INTERVAL: 300 ms
Zone Setting Detection Interval: 353 ms

## 2014-07-29 NOTE — Progress Notes (Signed)
Patient Care Team: Robynn Pane, MD as PCP - General (Internal Medicine)   HPI  Parker Johnson is a 48 y.o. male Is seen in followup for ICD implantation for congestive heart failure and nonischemic cardiomyopathy. He was part of the MADIT- RiT trial   He has a narrow QRS HE  received a dual-chamber device without left ventricular resynchronization   He was hospitalized in April 2015 with acute on chronic heart failure. It is noted that he has a history of atrial fibrillation with embolism to the arm in 2014. He is on warfarin.  He has complaints of a sore throat and cold.   Past Medical History  Diagnosis Date  . Ventricular tachycardia   . AICD (automatic cardioverter/defibrillator) present     [employer] 100  . Tobacco abuse   . Alcohol abuse     hx  . Cardiomyopathy     secondary  . HTN (hypertension)     uncontrolled  . CHF (congestive heart failure)   . Gout     Past Surgical History  Procedure Laterality Date  . Cardiac defibrillator placement  2010    [employer] tleigen 100  . Embolectomy Right 06/04/2013    Procedure: Upper Extremity Thrombectomy;  Surgeon: Nada Libman, MD;  Location: [facility] OR;  Service: Vascular;  Laterality: Right;    Current Outpatient Prescriptions  Medication Sig Dispense Refill  . allopurinol (ZYLOPRIM) 100 MG tablet Take 100 mg by mouth daily.      Marland Kitchen aspirin 81 MG chewable tablet Chew 1 tablet (81 mg total) by mouth daily.  30 tablet  3  . carvedilol (COREG) 6.25 MG tablet Take 6.25 mg by mouth 2 (two) times daily with a meal.      . digoxin (LANOXIN) 0.25 MG tablet Take 0.25 mg by mouth daily.      Marland Kitchen lisinopril (PRINIVIL,ZESTRIL) 20 MG tablet Take 20 mg by mouth daily.        . Omega-3 Fatty Acids (OMEGA 3 PO) Take 1 tablet by mouth daily.      . Polyvinyl Alcohol-Povidone (REFRESH OP) Place 2 drops into both eyes daily as needed (for red, itchy eyes).      Marland Kitchen spironolactone (ALDACTONE) 25 MG tablet  Take 25 mg by mouth daily.        Marland Kitchen torsemide (DEMADEX) 20 MG tablet Take 40 mg by mouth 2 (two) times daily.      . vitamin B-12 (CYANOCOBALAMIN) 1000 MCG tablet Take 1,000 mcg by mouth daily.      Marland Kitchen warfarin (COUMADIN) 10 MG tablet Take 10 mg by mouth daily.       No current facility-administered medications for this visit.    No Known Allergies  Review of Systems negative except from HPI and PMH  Physical Exam BP 112/84  Pulse 83  Ht 5\' 7"  (1.702 m)  Wt 199 lb (90.266 kg)  BMI 31.16 kg/m2 Well developed and well nourished in no acute distress HENT normal E scleral and icterus clear Neck Supple JVP flat; carotids brisk and full Clear to ausculation Device pocket well healed; without hematoma or erythema.  There is no tethering Regular rate and rhythm, no murmurs gallops or rub Soft with active bowel sounds No clubbing cyanosis  Edema Alert and oriented, grossly normal motor and sensory function Skin Warm and Dry  ECG demonstrates sinus rhythm at 83 Intervals 15/10/39 Poor wave progression Left axis deviation  Assessment and  Plan  nonischemic cardiomyopathy  Paroxysmal atrial fibrillation  Congestive heart failure-chronic-systolic  Implantable defibrillator-[employer] The patient's device was interrogated.  The information was reviewed. No changes were made in the programming.    VIRAL URI  From a rhythm review the patient is stable. No evidence of atrial fibrillation detected on his device over the last 9 months. He is currently on warfarin and aspirin. Reason guidelines revealed that there is no specific recommendation for the adjunctive use of aspirin to warfarin to reduce thromboembolism. I asked the patient to follow this up with Dr. Genella Rife  I suggested that he see urgent care for throat culture  or just to treat the problem symptomatically at this juncture.  Metabolic profile and digoxin levels I presume are being followed by Dr. Melchor Amour

## 2014-07-29 NOTE — Patient Instructions (Signed)
Your physician recommends that you continue on your current medications as directed. Please refer to the Current Medication list given to you today.  Remote monitoring is used to monitor your Pacemaker of ICD from home. This monitoring reduces the number of office visits required to check your device to one time per year. It allows [place] to keep an eye on the functioning of your device to ensure it is working properly. You are scheduled for a device check from home on 10/28/14. You may send your transmission at any time that day. If you have a wireless device, the transmission will be sent automatically. After your physician reviews your transmission, you will receive a postcard with your next transmission date.  Your physician wants you to follow-up in: 1 year with Dr. Graciela Husbands.  You will receive a reminder letter in the mail two months in advance. If you don't receive a letter, please call our office to schedule the follow-up appointment.

## 2014-08-04 ENCOUNTER — Encounter: Payer: Self-pay | Admitting: Internal Medicine

## 2014-10-28 ENCOUNTER — Ambulatory Visit (INDEPENDENT_AMBULATORY_CARE_PROVIDER_SITE_OTHER): Payer: Medicaid Other | Admitting: *Deleted

## 2014-10-28 DIAGNOSIS — I428 Other cardiomyopathies: Secondary | ICD-10-CM

## 2014-10-28 DIAGNOSIS — I4729 Other ventricular tachycardia: Secondary | ICD-10-CM

## 2014-10-28 DIAGNOSIS — I472 Ventricular tachycardia: Secondary | ICD-10-CM

## 2014-10-28 DIAGNOSIS — I429 Cardiomyopathy, unspecified: Secondary | ICD-10-CM

## 2014-10-28 LAB — MDC_IDC_ENUM_SESS_TYPE_REMOTE
Battery Remaining Longevity: 78 mo
Battery Remaining Percentage: 90 %
Brady Statistic RA Percent Paced: 0 %
Brady Statistic RV Percent Paced: 0 %
Date Time Interrogation Session: 20151202142100
HighPow Impedance: 58 Ohm
Implantable Pulse Generator Serial Number: 143406
Lead Channel Impedance Value: 500 Ohm
Lead Channel Pacing Threshold Amplitude: 0.4 V
Lead Channel Pacing Threshold Amplitude: 0.7 V
Lead Channel Setting Pacing Amplitude: 2 V
Lead Channel Setting Pacing Amplitude: 2.4 V
Lead Channel Setting Pacing Pulse Width: 0.4 ms
Lead Channel Setting Sensing Sensitivity: 0.6 mV
MDC IDC MSMT LEADCHNL RA PACING THRESHOLD PULSEWIDTH: 0.4 ms
MDC IDC MSMT LEADCHNL RV IMPEDANCE VALUE: 517 Ohm
MDC IDC MSMT LEADCHNL RV PACING THRESHOLD PULSEWIDTH: 0.4 ms
MDC IDC SET ZONE DETECTION INTERVAL: 300 ms
Zone Setting Detection Interval: 250 ms
Zone Setting Detection Interval: 353 ms

## 2014-10-29 NOTE — Progress Notes (Signed)
Remote ICD transmission.   

## 2014-11-16 ENCOUNTER — Telehealth: Payer: Self-pay | Admitting: Cardiology

## 2014-11-16 NOTE — Telephone Encounter (Signed)
LMOVM requesting that pt send manual transmission from home monitor.  

## 2014-12-15 ENCOUNTER — Encounter: Payer: Self-pay | Admitting: Internal Medicine

## 2015-01-14 ENCOUNTER — Encounter (HOSPITAL_COMMUNITY): Payer: Self-pay | Admitting: *Deleted

## 2015-01-14 ENCOUNTER — Emergency Department (HOSPITAL_COMMUNITY)
Admission: EM | Admit: 2015-01-14 | Discharge: 2015-01-14 | Disposition: A | Discharge status: Home / Self Care | Payer: Medicaid Other | Attending: Emergency Medicine | Admitting: Emergency Medicine

## 2015-01-14 ENCOUNTER — Emergency Department (HOSPITAL_COMMUNITY): Payer: Medicaid Other

## 2015-01-14 DIAGNOSIS — M109 Gout, unspecified: Secondary | ICD-10-CM | POA: Insufficient documentation

## 2015-01-14 DIAGNOSIS — Z7901 Long term (current) use of anticoagulants: Secondary | ICD-10-CM | POA: Diagnosis not present

## 2015-01-14 DIAGNOSIS — R05 Cough: Secondary | ICD-10-CM | POA: Insufficient documentation

## 2015-01-14 DIAGNOSIS — Z95810 Presence of automatic (implantable) cardiac defibrillator: Secondary | ICD-10-CM | POA: Insufficient documentation

## 2015-01-14 DIAGNOSIS — Z79899 Other long term (current) drug therapy: Secondary | ICD-10-CM | POA: Insufficient documentation

## 2015-01-14 DIAGNOSIS — Z720 Tobacco use: Secondary | ICD-10-CM | POA: Diagnosis not present

## 2015-01-14 DIAGNOSIS — R0602 Shortness of breath: Secondary | ICD-10-CM | POA: Diagnosis present

## 2015-01-14 DIAGNOSIS — I10 Essential (primary) hypertension: Secondary | ICD-10-CM | POA: Insufficient documentation

## 2015-01-14 DIAGNOSIS — N342 Other urethritis: Secondary | ICD-10-CM

## 2015-01-14 DIAGNOSIS — N341 Nonspecific urethritis: Secondary | ICD-10-CM | POA: Insufficient documentation

## 2015-01-14 DIAGNOSIS — I509 Heart failure, unspecified: Secondary | ICD-10-CM

## 2015-01-14 DIAGNOSIS — R079 Chest pain, unspecified: Secondary | ICD-10-CM | POA: Insufficient documentation

## 2015-01-14 LAB — URINALYSIS, ROUTINE W REFLEX MICROSCOPIC
Bilirubin Urine: NEGATIVE
Glucose, UA: NEGATIVE mg/dL
Hgb urine dipstick: NEGATIVE
Ketones, ur: NEGATIVE mg/dL
Leukocytes, UA: NEGATIVE
NITRITE: NEGATIVE
PH: 5.5 (ref 5.0–8.0)
Protein, ur: 30 mg/dL — AB
SPECIFIC GRAVITY, URINE: 1.018 (ref 1.005–1.030)
UROBILINOGEN UA: 0.2 mg/dL (ref 0.0–1.0)

## 2015-01-14 LAB — CBC
HCT: 49.2 % (ref 39.0–52.0)
Hemoglobin: 16.6 g/dL (ref 13.0–17.0)
MCH: 31.4 pg (ref 26.0–34.0)
MCHC: 33.7 g/dL (ref 30.0–36.0)
MCV: 93.2 fL (ref 78.0–100.0)
PLATELETS: 172 10*3/uL (ref 150–400)
RBC: 5.28 MIL/uL (ref 4.22–5.81)
RDW: 13.6 % (ref 11.5–15.5)
WBC: 6.1 10*3/uL (ref 4.0–10.5)

## 2015-01-14 LAB — BASIC METABOLIC PANEL
Anion gap: 8 (ref 5–15)
BUN: 29 mg/dL — ABNORMAL HIGH (ref 6–23)
CALCIUM: 9.5 mg/dL (ref 8.4–10.5)
CO2: 27 mmol/L (ref 19–32)
CREATININE: 1.1 mg/dL (ref 0.50–1.35)
Chloride: 102 mmol/L (ref 96–112)
GFR calc Af Amer: >90 mL/min (ref >90)
GFR, EST NON AFRICAN AMERICAN: 78 mL/min — AB (ref >90)
GLUCOSE: 102 mg/dL — AB (ref 70–99)
Potassium: 4.1 mmol/L (ref 3.5–5.1)
Sodium: 137 mmol/L (ref 135–145)

## 2015-01-14 LAB — URINE MICROSCOPIC-ADD ON

## 2015-01-14 LAB — I-STAT TROPONIN, ED: Troponin i, poc: 0.02 ng/mL (ref 0.00–0.08)

## 2015-01-14 LAB — BRAIN NATRIURETIC PEPTIDE: B Natriuretic Peptide: 598.8 pg/mL — ABNORMAL HIGH (ref 0.0–100.0)

## 2015-01-14 LAB — PROTIME-INR
INR: 1.56 — ABNORMAL HIGH (ref 0.00–1.49)
Prothrombin Time: 18.8 seconds — ABNORMAL HIGH (ref 11.6–15.2)

## 2015-01-14 MED ORDER — CEFTRIAXONE SODIUM 250 MG IJ SOLR
250.0000 mg | Freq: Once | INTRAMUSCULAR | Status: AC
  Administered 2015-01-14: 250 mg via INTRAMUSCULAR
  Filled 2015-01-14: qty 250

## 2015-01-14 MED ORDER — AZITHROMYCIN 1 G PO PACK
1.0000 g | PACK | Freq: Once | ORAL | Status: AC
  Administered 2015-01-14: 1 g via ORAL
  Filled 2015-01-14: qty 1

## 2015-01-14 MED ORDER — FUROSEMIDE 10 MG/ML IJ SOLN
40.0000 mg | Freq: Once | INTRAMUSCULAR | Status: AC
  Administered 2015-01-14: 40 mg via INTRAVENOUS
  Filled 2015-01-14: qty 4

## 2015-01-14 MED ORDER — LIDOCAINE HCL (PF) 1 % IJ SOLN
INTRAMUSCULAR | Status: AC
  Administered 2015-01-14: 5 mL
  Filled 2015-01-14: qty 5

## 2015-01-14 NOTE — Discharge Instructions (Signed)
Heart Failure Heart failure means your heart has trouble pumping blood. This makes it hard for your body to work well. Heart failure is usually a long-term (chronic) condition. You must take good care of yourself and follow your doctor's treatment plan. HOME CARE  Take your heart medicine as told by your doctor.  Do not stop taking medicine unless your doctor tells you to.  Do not skip any dose of medicine.  Refill your medicines before they run out.  Take other medicines only as told by your doctor or pharmacist.  Stay active if told by your doctor. The elderly and people with severe heart failure should talk with a doctor about physical activity.  Eat heart-healthy foods. Choose foods that are without trans fat and are low in saturated fat, cholesterol, and salt (sodium). This includes fresh or frozen fruits and vegetables, fish, lean meats, fat-free or low-fat dairy foods, whole grains, and high-fiber foods. Lentils and dried peas and beans (legumes) are also good choices.  Limit salt if told by your doctor.  Cook in a healthy way. Roast, grill, broil, bake, poach, steam, or stir-fry foods.  Limit fluids as told by your doctor.  Weigh yourself every morning. Do this after you pee (urinate) and before you eat breakfast. Write down your weight to give to your doctor.  Take your blood pressure and write it down if your doctor tells you to.  Ask your doctor how to check your pulse. Check your pulse as told.  Lose weight if told by your doctor.  Stop smoking or chewing tobacco. Do not use gum or patches that help you quit without your doctor's approval.  Schedule and go to doctor visits as told.  Nonpregnant women should have no more than 1 drink a day. Men should have no more than 2 drinks a day. Talk to your doctor about drinking alcohol.  Stop illegal drug use.  Stay current with shots (immunizations).  Manage your health conditions as told by your doctor.  Learn to  manage your stress.  Rest when you are tired.  If it is really hot outside:  Avoid intense activities.  Use air conditioning or fans, or get in a cooler place.  Avoid caffeine and alcohol.  Wear loose-fitting, lightweight, and light-colored clothing.  If it is really cold outside:  Avoid intense activities.  Layer your clothing.  Wear mittens or gloves, a hat, and a scarf when going outside.  Avoid alcohol.  Learn about heart failure and get support as needed.  Get help to maintain or improve your quality of life and your ability to care for yourself as needed. GET HELP IF:   You gain 03 lb/1.4 kg or more in 1 day or 05 lb/2.3 kg in a week.  You are more short of breath than usual.  You cannot do your normal activities.  You tire easily.  You cough more than normal, especially with activity.  You have any or more puffiness (swelling) in areas such as your hands, feet, ankles, or belly (abdomen).  You cannot sleep because it is hard to breathe.  You feel like your heart is beating fast (palpitations).  You get dizzy or light-headed when you stand up. GET HELP RIGHT AWAY IF:   You have trouble breathing.  There is a change in mental status, such as becoming less alert or not being able to focus.  You have chest pain or discomfort.  You faint. MAKE SURE YOU:   Understand these instructions.  Will watch your condition.  Will get help right away if you are not doing well or get worse. Document Released: 08/21/2008 Document Revised: 03/29/2014 Document Reviewed: 12/29/2012 [facility] Patient Information 2015 [place], [place]. This information is not intended to replace advice given to you by your health care provider. Make sure you discuss any questions you have with your health care provider.  Urethritis Urethritis is an inflammation of the tube through which urine exits your bladder (urethra).  CAUSES Urethritis is often caused by an infection in your  urethra. The infection can be viral, like herpes. The infection can also be bacterial, like gonorrhea. RISK FACTORS Risk factors of urethritis include:  Having sex without using a condom.  Having multiple sexual partners.  Having poor hygiene. SIGNS AND SYMPTOMS Symptoms of urethritis are less noticeable in women than in men. These symptoms include:  Burning feeling when you urinate (dysuria).  Discharge from your urethra.  Blood in your urine (hematuria).  Urinating more than usual. DIAGNOSIS  To confirm a diagnosis of urethritis, your health care provider will do the following:  Ask about your sexual history.  Perform a physical exam.  Have you provide a sample of your urine for lab testing.  Use a cotton swab to gently collect a sample from your urethra for lab testing. TREATMENT  It is important to treat urethritis. Depending on the cause, untreated urethritis may lead to serious genital infections and possibly infertility. Urethritis caused by a bacterial infection is treated with antibiotic medicine. All sexual partners must be treated.  HOME CARE INSTRUCTIONS  Do not have sex until the test results are known and treatment is completed, even if your symptoms go away before you finish treatment.  If you were prescribed an antibiotic, finish it all even if you start to feel better. SEEK MEDICAL CARE IF:   Your symptoms are not improved in 3 days.  Your symptoms are getting worse.  You develop abdominal pain or pelvic pain (in women).  You develop joint pain.  You have a fever. SEEK IMMEDIATE MEDICAL CARE IF:   You have severe pain in the belly, back, or side.  You have repeated vomiting. MAKE SURE YOU:  Understand these instructions.  Will watch your condition.  Will get help right away if you are not doing well or get worse. Document Released: 05/08/2001 Document Revised: 03/29/2014 Document Reviewed: 07/13/2013 [facility] Patient Information 2015  [place], [place]. This information is not intended to replace advice given to you by your health care provider. Make sure you discuss any questions you have with your health care provider.

## 2015-01-14 NOTE — ED Notes (Signed)
Reports having difficulty breathing x 3-4 days, sob with mild exertion, hx of chf. Denies cp or swelling. Also having irritation to penis and pain with urination.

## 2015-01-14 NOTE — ED Notes (Signed)
EDP at bedside  

## 2015-01-14 NOTE — ED Provider Notes (Signed)
CSN: [id number]     Arrival date & time 01/14/15  1020 History   First MD Initiated Contact with Patient 01/14/15 1022     Chief Complaint  Patient presents with  . Shortness of Breath     (Consider location/radiation/quality/duration/timing/severity/associated sxs/prior Treatment) HPI Comments: Patient presents with shortness of breath. He has a history of congestive heart failure and has an AICD in place. He is followed by Dr. Sharyn Lull.  He reports a 3 to four-day history of worsening shortness of breath. His shortness of breath is mostly on exertion. He denies any chest pain or pressure. He denies any leg swelling. He denies any dizziness or diaphoresis. He is on torosemide and states that he's been taking his medications regularly. He's also on Coumadin. He has a little bit of coughing and chest congestion. He denies any orthopnea. He does report a 20 pound weight gain since his last hospitalization which was about 6 months ago. He says that he hasn't really been weighing himself at home. He does say he's not been very compliant with his diet. He does report he has some burning on urination which has been going on about 3-4 days. He had a recent sexual encounter but he said he did use a condom and denies any known STD exposure. He denies any discharge from his penis.  Patient is a 49 y.o. male presenting with shortness of breath.  Shortness of Breath Associated symptoms: cough   Associated symptoms: no abdominal pain, no chest pain, no diaphoresis, no fever, no headaches, no rash and no vomiting     Past Medical History  Diagnosis Date  . Ventricular tachycardia   . AICD (automatic cardioverter/defibrillator) present     [employer] 100  . Tobacco abuse   . Alcohol abuse     hx  . Cardiomyopathy     secondary  . HTN (hypertension)     uncontrolled  . CHF (congestive heart failure)   . Gout    Past Surgical History  Procedure Laterality Date  . Cardiac defibrillator  placement  2010    [employer]  . Embolectomy Right 06/04/2013    Procedure: Upper Extremity Thrombectomy;  Surgeon: Nada Libman, MD;  Location: [facility] OR;  Service: Vascular;  Laterality: Right;   Family History  Problem Relation Age of Onset  . Cardiomyopathy Father     on a defib  . Diabetes Father    History  Substance Use Topics  . Smoking status: Current Every Day Smoker -- 0.50 packs/day for 30 years    Types: Cigarettes  . Smokeless tobacco: Never Used     Comment: I already have information on quitting"  . Alcohol Use: 0.6 oz/week    1 Cans of beer per week    Review of Systems  Constitutional: Negative for fever, chills, diaphoresis and fatigue.  HENT: Negative for congestion, rhinorrhea and sneezing.   Eyes: Negative.   Respiratory: Positive for cough and shortness of breath. Negative for chest tightness.   Cardiovascular: Negative for chest pain and leg swelling.  Gastrointestinal: Negative for nausea, vomiting, abdominal pain, diarrhea and blood in stool.  Genitourinary: Positive for penile pain. Negative for frequency, hematuria, flank pain and difficulty urinating.  Musculoskeletal: Negative for back pain and arthralgias.  Skin: Negative for rash.  Neurological: Negative for dizziness, speech difficulty, weakness, numbness and headaches.      Allergies  Review of patient's allergies indicates no known allergies.  Home Medications   Prior  to Admission medications   Medication Sig Start Date End Date Taking? Authorizing Provider  allopurinol (ZYLOPRIM) 100 MG tablet Take 100 mg by mouth daily.   Yes Historical Provider, MD  carvedilol (COREG) 6.25 MG tablet Take 6.25 mg by mouth daily.    Yes Historical Provider, MD  digoxin (LANOXIN) 0.25 MG tablet Take 0.25 mg by mouth daily.   Yes Historical Provider, MD  lisinopril (PRINIVIL,ZESTRIL) 20 MG tablet Take 20 mg by mouth daily.     Yes Historical Provider, MD  Omega-3 Fatty Acids (OMEGA 3  PO) Take 1 tablet by mouth daily.   Yes Historical Provider, MD  spironolactone (ALDACTONE) 25 MG tablet Take 25 mg by mouth daily.     Yes Historical Provider, MD  torsemide (DEMADEX) 20 MG tablet Take 40 mg by mouth 2 (two) times daily.   Yes Historical Provider, MD  vitamin B-12 (CYANOCOBALAMIN) 1000 MCG tablet Take 1,000 mcg by mouth daily.   Yes Historical Provider, MD  warfarin (COUMADIN) 10 MG tablet Take 10 mg by mouth daily.   Yes Historical Provider, MD  aspirin 81 MG chewable tablet Chew 1 tablet (81 mg total) by mouth daily. Patient not taking: Reported on 01/14/2015 07/03/14   Lyanne Co, MD   BP 101/68 mmHg  Pulse 76  Resp 21  SpO2 99% Physical Exam  Constitutional: He is oriented to person, place, and time. He appears well-developed and well-nourished.  HENT:  Head: Normocephalic and atraumatic.  Eyes: Pupils are equal, round, and reactive to light.  Neck: Normal range of motion. Neck supple.  Cardiovascular: Normal rate, regular rhythm and normal heart sounds.   Pulmonary/Chest: Effort normal and breath sounds normal. No respiratory distress. He has no wheezes. He has no rales. He exhibits no tenderness.  Abdominal: Soft. Bowel sounds are normal. There is no tenderness. There is no rebound and no guarding.  Genitourinary:  Normal-appearing circumcised male genitalia. No lesions or sores are noted. No discharge from the tip of the penis is noted  Musculoskeletal: Normal range of motion. He exhibits no edema.  No calf tenderness  Lymphadenopathy:    He has no cervical adenopathy.  Neurological: He is alert and oriented to person, place, and time.  Skin: Skin is warm and dry. No rash noted.  Psychiatric: He has a normal mood and affect.    ED Course  Procedures (including critical care time) Labs Review Labs Reviewed  BASIC METABOLIC PANEL - Abnormal; Notable for the following:    Glucose, Bld 102 (*)    BUN 29 (*)    GFR calc non Af Amer 78 (*)    All other  components within normal limits  BRAIN NATRIURETIC PEPTIDE - Abnormal; Notable for the following:    B Natriuretic Peptide 598.8 (*)    All other components within normal limits  URINALYSIS, ROUTINE W REFLEX MICROSCOPIC - Abnormal; Notable for the following:    Protein, ur 30 (*)    All other components within normal limits  PROTIME-INR - Abnormal; Notable for the following:    Prothrombin Time 18.8 (*)    INR 1.56 (*)    All other components within normal limits  CBC  URINE MICROSCOPIC-ADD ON  HIV ANTIBODY (ROUTINE TESTING)  I-STAT TROPOININ, ED  GC/CHLAMYDIA PROBE AMP ()    Imaging Review Dg Chest 2 View  01/14/2015   CLINICAL DATA:  Three-day history of cough and difficulty breathing  EXAM: CHEST  2 VIEW  COMPARISON:  March 09, 2014  FINDINGS: There is no edema or consolidation. Heart is enlarged with pulmonary vascularity within normal limits. Pacemaker leads are attached to the right atrium and right ventricle. No pneumothorax. No bone lesions. No adenopathy.  IMPRESSION: Cardiomegaly. Pacemaker leads attached to right heart. Lungs clear.   Electronically Signed   By: Bretta Bang III M.D.   On: 01/14/2015 11:27     EKG Interpretation   Date/Time:  Friday January 14 2015 10:22:13 EST Ventricular Rate:  72 PR Interval:  156 QRS Duration: 100 QT Interval:  410 QTC Calculation: 448 R Axis:   -102 Text Interpretation:  Normal sinus rhythm Biatrial enlargement Right  superior axis deviation Anterior infarct , age undetermined Abnormal ECG  since last tracing no significant change Confirmed by Maddon Horton  MD, Maniyah Moller  ([id number]) on 01/14/2015 1:56:34 PM      MDM   Final diagnoses:  Congestive heart failure, unspecified congestive heart failure chronicity, unspecified congestive heart failure type  Urethritis    Patient presents with shortness of breath. He was given 1 dose of Lasix here in the ED and he said urinated about 700 cc. He's ambulating without any  shortness of breath. His pulse ox is 97% on ambulation. He is currently asymptomatic. EKG did not show ischemia. His troponin is negative. Feel he can be discharged home. I spoke with his cardiologist, Dr. Sharyn Lull who agrees with the plan. He was treated with Zithromax and Rocephin for possible STD given his urethritis. I encouraged him to follow-up with Dr. Sharyn Lull within the next week.    Rolan Bucco, MD 01/14/15 [phone]

## 2015-01-14 NOTE — ED Notes (Signed)
Pt ambulated in hallway with pulse ox. o2 97, heart rate 85. RN Notified.

## 2015-01-14 NOTE — ED Notes (Signed)
Pt taken to xray 

## 2015-01-14 NOTE — ED Notes (Signed)
Pt reports pain in groin and sob. Hx of chf. Cough noted.

## 2015-01-17 LAB — GC/CHLAMYDIA PROBE AMP (~~LOC~~) NOT AT ARMC
Chlamydia: NEGATIVE
Neisseria Gonorrhea: NEGATIVE

## 2015-01-17 LAB — HIV ANTIBODY (ROUTINE TESTING W REFLEX): HIV SCREEN 4TH GENERATION: NONREACTIVE

## 2015-02-01 ENCOUNTER — Encounter: Payer: Medicaid Other | Admitting: *Deleted

## 2015-02-01 ENCOUNTER — Telehealth: Payer: Self-pay | Admitting: Cardiology

## 2015-02-01 NOTE — Telephone Encounter (Signed)
Spoke with pt and reminded pt of remote transmission that is due today. Pt verbalized understanding.   

## 2015-02-02 ENCOUNTER — Encounter: Payer: Self-pay | Admitting: Cardiology

## 2015-02-21 ENCOUNTER — Ambulatory Visit (INDEPENDENT_AMBULATORY_CARE_PROVIDER_SITE_OTHER): Payer: Medicaid Other | Admitting: *Deleted

## 2015-02-21 ENCOUNTER — Encounter: Payer: Self-pay | Admitting: Internal Medicine

## 2015-02-21 DIAGNOSIS — I429 Cardiomyopathy, unspecified: Secondary | ICD-10-CM | POA: Diagnosis not present

## 2015-02-21 DIAGNOSIS — I428 Other cardiomyopathies: Secondary | ICD-10-CM

## 2015-02-22 LAB — MDC_IDC_ENUM_SESS_TYPE_REMOTE
Brady Statistic RA Percent Paced: 0 %
Lead Channel Sensing Intrinsic Amplitude: 22.7 mV
Lead Channel Sensing Intrinsic Amplitude: 3.6 mV
MDC IDC PG SERIAL: 143406
MDC IDC SET LEADCHNL RA PACING AMPLITUDE: 2 V
MDC IDC SET LEADCHNL RV PACING AMPLITUDE: 2.4 V
MDC IDC SET LEADCHNL RV PACING PULSEWIDTH: 0.4 ms
MDC IDC SET LEADCHNL RV SENSING SENSITIVITY: 0.6 mV
MDC IDC SET ZONE DETECTION INTERVAL: 250 ms
MDC IDC STAT BRADY RV PERCENT PACED: 0 %
Zone Setting Detection Interval: 300 ms
Zone Setting Detection Interval: 353 ms

## 2015-02-22 NOTE — Progress Notes (Signed)
Remote ICD transmission.   

## 2015-03-02 ENCOUNTER — Encounter: Payer: Self-pay | Admitting: Cardiology

## 2015-03-13 ENCOUNTER — Encounter (HOSPITAL_COMMUNITY): Payer: Self-pay | Admitting: *Deleted

## 2015-03-13 ENCOUNTER — Emergency Department (HOSPITAL_COMMUNITY): Payer: Medicaid Other

## 2015-03-13 ENCOUNTER — Inpatient Hospital Stay (HOSPITAL_COMMUNITY)
Admission: EM | Admit: 2015-03-13 | Discharge: 2015-03-17 | DRG: 292 | Disposition: A | Discharge status: Home / Self Care | Payer: Medicaid Other | Attending: Cardiology | Admitting: Cardiology

## 2015-03-13 DIAGNOSIS — F17210 Nicotine dependence, cigarettes, uncomplicated: Secondary | ICD-10-CM | POA: Diagnosis present

## 2015-03-13 DIAGNOSIS — N189 Chronic kidney disease, unspecified: Secondary | ICD-10-CM | POA: Diagnosis present

## 2015-03-13 DIAGNOSIS — I130 Hypertensive heart and chronic kidney disease with heart failure and stage 1 through stage 4 chronic kidney disease, or unspecified chronic kidney disease: Secondary | ICD-10-CM | POA: Diagnosis present

## 2015-03-13 DIAGNOSIS — I472 Ventricular tachycardia: Secondary | ICD-10-CM | POA: Diagnosis present

## 2015-03-13 DIAGNOSIS — I420 Dilated cardiomyopathy: Secondary | ICD-10-CM | POA: Diagnosis present

## 2015-03-13 DIAGNOSIS — N179 Acute kidney failure, unspecified: Secondary | ICD-10-CM | POA: Diagnosis present

## 2015-03-13 DIAGNOSIS — I252 Old myocardial infarction: Secondary | ICD-10-CM | POA: Diagnosis not present

## 2015-03-13 DIAGNOSIS — R748 Abnormal levels of other serum enzymes: Secondary | ICD-10-CM | POA: Diagnosis present

## 2015-03-13 DIAGNOSIS — I5023 Acute on chronic systolic (congestive) heart failure: Secondary | ICD-10-CM | POA: Diagnosis present

## 2015-03-13 DIAGNOSIS — I480 Paroxysmal atrial fibrillation: Secondary | ICD-10-CM | POA: Diagnosis present

## 2015-03-13 DIAGNOSIS — M1000 Idiopathic gout, unspecified site: Secondary | ICD-10-CM | POA: Diagnosis present

## 2015-03-13 DIAGNOSIS — Z95810 Presence of automatic (implantable) cardiac defibrillator: Secondary | ICD-10-CM | POA: Diagnosis not present

## 2015-03-13 DIAGNOSIS — I509 Heart failure, unspecified: Secondary | ICD-10-CM

## 2015-03-13 DIAGNOSIS — Z7982 Long term (current) use of aspirin: Secondary | ICD-10-CM | POA: Diagnosis not present

## 2015-03-13 DIAGNOSIS — R0602 Shortness of breath: Secondary | ICD-10-CM | POA: Diagnosis not present

## 2015-03-13 DIAGNOSIS — Z7901 Long term (current) use of anticoagulants: Secondary | ICD-10-CM | POA: Diagnosis not present

## 2015-03-13 LAB — CBC WITH DIFFERENTIAL/PLATELET
BASOS ABS: 0.1 10*3/uL (ref 0.0–0.1)
BASOS PCT: 1 % (ref 0–1)
Eosinophils Absolute: 0.2 10*3/uL (ref 0.0–0.7)
Eosinophils Relative: 4 % (ref 0–5)
HCT: 45.1 % (ref 39.0–52.0)
HEMOGLOBIN: 15.1 g/dL (ref 13.0–17.0)
Lymphocytes Relative: 36 % (ref 12–46)
Lymphs Abs: 2.2 10*3/uL (ref 0.7–4.0)
MCH: 31.6 pg (ref 26.0–34.0)
MCHC: 33.5 g/dL (ref 30.0–36.0)
MCV: 94.4 fL (ref 78.0–100.0)
MONOS PCT: 5 % (ref 3–12)
Monocytes Absolute: 0.3 10*3/uL (ref 0.1–1.0)
Neutro Abs: 3.3 10*3/uL (ref 1.7–7.7)
Neutrophils Relative %: 54 % (ref 43–77)
PLATELETS: 184 10*3/uL (ref 150–400)
RBC: 4.78 MIL/uL (ref 4.22–5.81)
RDW: 14.8 % (ref 11.5–15.5)
WBC: 6 10*3/uL (ref 4.0–10.5)

## 2015-03-13 LAB — COMPREHENSIVE METABOLIC PANEL
ALK PHOS: 74 U/L (ref 39–117)
ALK PHOS: 76 U/L (ref 39–117)
ALT: 18 U/L (ref 0–53)
ALT: 20 U/L (ref 0–53)
ANION GAP: 10 (ref 5–15)
AST: 22 U/L (ref 0–37)
AST: 20 U/L (ref 0–37)
Albumin: 3.6 g/dL (ref 3.5–5.2)
Albumin: 3.3 g/dL — ABNORMAL LOW (ref 3.5–5.2)
Anion gap: 12 (ref 5–15)
BILIRUBIN TOTAL: 2 mg/dL — AB (ref 0.3–1.2)
BUN: 23 mg/dL (ref 6–23)
BUN: 21 mg/dL (ref 6–23)
CALCIUM: 9.2 mg/dL (ref 8.4–10.5)
CO2: 26 mmol/L (ref 19–32)
CO2: 29 mmol/L (ref 19–32)
CREATININE: 1.38 mg/dL — AB (ref 0.50–1.35)
Calcium: 9.2 mg/dL (ref 8.4–10.5)
Chloride: 100 mmol/L (ref 96–112)
Chloride: 99 mmol/L (ref 96–112)
Creatinine, Ser: 1.42 mg/dL — ABNORMAL HIGH (ref 0.50–1.35)
GFR calc Af Amer: 66 mL/min — ABNORMAL LOW (ref >90)
GFR calc Af Amer: 68 mL/min — ABNORMAL LOW (ref >90)
GFR calc non Af Amer: 57 mL/min — ABNORMAL LOW (ref >90)
GFR calc non Af Amer: 59 mL/min — ABNORMAL LOW (ref >90)
GLUCOSE: 164 mg/dL — AB (ref 70–99)
Glucose, Bld: 103 mg/dL — ABNORMAL HIGH (ref 70–99)
POTASSIUM: 3.9 mmol/L (ref 3.5–5.1)
Potassium: 3.5 mmol/L (ref 3.5–5.1)
SODIUM: 138 mmol/L (ref 135–145)
Sodium: 138 mmol/L (ref 135–145)
TOTAL PROTEIN: 6.8 g/dL (ref 6.0–8.3)
Total Bilirubin: 1.9 mg/dL — ABNORMAL HIGH (ref 0.3–1.2)
Total Protein: 6.8 g/dL (ref 6.0–8.3)

## 2015-03-13 LAB — CBC
HCT: 45.9 % (ref 39.0–52.0)
Hemoglobin: 15.1 g/dL (ref 13.0–17.0)
MCH: 31.1 pg (ref 26.0–34.0)
MCHC: 32.9 g/dL (ref 30.0–36.0)
MCV: 94.4 fL (ref 78.0–100.0)
Platelets: 175 10*3/uL (ref 150–400)
RBC: 4.86 MIL/uL (ref 4.22–5.81)
RDW: 14.7 % (ref 11.5–15.5)
WBC: 5.5 10*3/uL (ref 4.0–10.5)

## 2015-03-13 LAB — I-STAT TROPONIN, ED: TROPONIN I, POC: 0.03 ng/mL (ref 0.00–0.08)

## 2015-03-13 LAB — DIGOXIN LEVEL
Digoxin Level: 0.3 ng/mL — ABNORMAL LOW (ref 0.8–2.0)
Digoxin Level: 0.4 ng/mL — ABNORMAL LOW (ref 0.8–2.0)

## 2015-03-13 LAB — PROTIME-INR
INR: 1.43 (ref 0.00–1.49)
INR: 1.41 (ref 0.00–1.49)
Prothrombin Time: 17.5 seconds — ABNORMAL HIGH (ref 11.6–15.2)
Prothrombin Time: 17.4 seconds — ABNORMAL HIGH (ref 11.6–15.2)

## 2015-03-13 LAB — TROPONIN I: TROPONIN I: 0.05 ng/mL — AB (ref <0.031)

## 2015-03-13 LAB — TSH: TSH: 1.143 u[IU]/mL (ref 0.350–4.500)

## 2015-03-13 LAB — BRAIN NATRIURETIC PEPTIDE
B NATRIURETIC PEPTIDE 5: 2339.9 pg/mL — AB (ref 0.0–100.0)
B Natriuretic Peptide: 2203.9 pg/mL — ABNORMAL HIGH (ref 0.0–100.0)

## 2015-03-13 LAB — APTT: APTT: 29 s (ref 24–37)

## 2015-03-13 LAB — MRSA PCR SCREENING: MRSA by PCR: NEGATIVE

## 2015-03-13 LAB — MAGNESIUM: Magnesium: 1.9 mg/dL (ref 1.5–2.5)

## 2015-03-13 MED ORDER — DIGOXIN 250 MCG PO TABS
0.2500 mg | ORAL_TABLET | Freq: Every day | ORAL | Status: DC
  Administered 2015-03-13 – 2015-03-17 (×5): 0.25 mg via ORAL
  Filled 2015-03-13 (×5): qty 1

## 2015-03-13 MED ORDER — SODIUM CHLORIDE 0.9 % IJ SOLN
3.0000 mL | Freq: Two times a day (BID) | INTRAMUSCULAR | Status: DC
  Administered 2015-03-13 – 2015-03-17 (×8): 3 mL via INTRAVENOUS

## 2015-03-13 MED ORDER — FUROSEMIDE 10 MG/ML IJ SOLN
80.0000 mg | Freq: Two times a day (BID) | INTRAMUSCULAR | Status: DC
  Administered 2015-03-13 – 2015-03-15 (×4): 80 mg via INTRAVENOUS
  Filled 2015-03-13 (×7): qty 8

## 2015-03-13 MED ORDER — IPRATROPIUM-ALBUTEROL 0.5-2.5 (3) MG/3ML IN SOLN
3.0000 mL | Freq: Once | RESPIRATORY_TRACT | Status: AC
  Administered 2015-03-13: 3 mL via RESPIRATORY_TRACT
  Filled 2015-03-13: qty 3

## 2015-03-13 MED ORDER — CARVEDILOL 3.125 MG PO TABS
3.1250 mg | ORAL_TABLET | Freq: Two times a day (BID) | ORAL | Status: DC
  Administered 2015-03-13 – 2015-03-17 (×8): 3.125 mg via ORAL
  Filled 2015-03-13 (×11): qty 1

## 2015-03-13 MED ORDER — ALLOPURINOL 100 MG PO TABS
100.0000 mg | ORAL_TABLET | Freq: Every day | ORAL | Status: DC
  Administered 2015-03-13 – 2015-03-17 (×5): 100 mg via ORAL
  Filled 2015-03-13 (×5): qty 1

## 2015-03-13 MED ORDER — PNEUMOCOCCAL VAC POLYVALENT 25 MCG/0.5ML IJ INJ
0.5000 mL | INJECTION | INTRAMUSCULAR | Status: AC
  Administered 2015-03-14: 0.5 mL via INTRAMUSCULAR
  Filled 2015-03-13: qty 0.5

## 2015-03-13 MED ORDER — WARFARIN SODIUM 2.5 MG PO TABS
12.5000 mg | ORAL_TABLET | Freq: Once | ORAL | Status: AC
  Administered 2015-03-13: 12.5 mg via ORAL
  Filled 2015-03-13: qty 1

## 2015-03-13 MED ORDER — WARFARIN - PHARMACIST DOSING INPATIENT
Freq: Every day | Status: DC
  Administered 2015-03-14 – 2015-03-16 (×2)

## 2015-03-13 MED ORDER — SODIUM CHLORIDE 0.9 % IV SOLN
INTRAVENOUS | Status: DC
  Administered 2015-03-13: 10 mL/h via INTRAVENOUS

## 2015-03-13 MED ORDER — SODIUM CHLORIDE 0.9 % IJ SOLN: 3.0000 mL | INTRAMUSCULAR | Status: DC | PRN

## 2015-03-13 MED ORDER — WARFARIN SODIUM 10 MG PO TABS: 10.0000 mg | ORAL_TABLET | Freq: Every day | ORAL | Status: DC

## 2015-03-13 MED ORDER — ACETAMINOPHEN 325 MG PO TABS
650.0000 mg | ORAL_TABLET | ORAL | Status: DC | PRN
  Administered 2015-03-16 (×2): 650 mg via ORAL
  Filled 2015-03-13 (×3): qty 2

## 2015-03-13 MED ORDER — SODIUM CHLORIDE 0.9 % IV SOLN: 250.0000 mL | INTRAVENOUS | Status: DC | PRN

## 2015-03-13 MED ORDER — ONDANSETRON HCL 4 MG/2ML IJ SOLN: 4.0000 mg | Freq: Four times a day (QID) | INTRAMUSCULAR | Status: DC | PRN

## 2015-03-13 MED ORDER — FUROSEMIDE 10 MG/ML IJ SOLN
80.0000 mg | Freq: Once | INTRAMUSCULAR | Status: AC
  Administered 2015-03-13: 80 mg via INTRAVENOUS
  Filled 2015-03-13: qty 8

## 2015-03-13 MED ORDER — MILRINONE IN DEXTROSE 20 MG/100ML IV SOLN
0.1250 ug/kg/min | INTRAVENOUS | Status: DC
  Administered 2015-03-13 – 2015-03-16 (×7): 0.375 ug/kg/min via INTRAVENOUS
  Administered 2015-03-17: 0.25 ug/kg/min via INTRAVENOUS
  Filled 2015-03-13 (×10): qty 100

## 2015-03-13 NOTE — ED Notes (Signed)
Pt with hx of chf c/o increasing sob x 1 month and coughing up blood.

## 2015-03-13 NOTE — ED Provider Notes (Signed)
CSN: [id number]     Arrival date & time 03/13/15  4098 History   First MD Initiated Contact with Patient 03/13/15 [phone]     Chief Complaint  Patient presents with  . Shortness of Breath     (Consider location/radiation/quality/duration/timing/severity/associated sxs/prior Treatment) Patient is a 49 y.o. male presenting with shortness of breath and cough. The history is provided by the patient.  Shortness of Breath Severity:  Moderate Onset quality:  Gradual Duration:  1 month Timing:  Constant Progression:  Unchanged Chronicity:  Recurrent Context: URI   Relieved by:  Sitting up Worsened by:  Exertion Ineffective treatments:  Inhaler Associated symptoms: chest pain (intermittent) and cough   Associated symptoms: no fever, no headaches, no rash, no syncope and no wheezing   Cough Cough characteristics:  Productive Sputum characteristics:  Bloody Severity:  Moderate Onset quality:  Sudden Duration:  4 days Timing:  Intermittent Chronicity:  New Smoker: yes   Relieved by: eating/drinking. Exacerbated by: drinking soda. Associated symptoms: chest pain (intermittent) and shortness of breath   Associated symptoms: no fever, no headaches, no rash, no rhinorrhea and no wheezing   Shortness of breath:    Severity:  Moderate   Onset quality:  Gradual   Duration:  1 month   Timing:  Constant   Progression:  Worsening   Past Medical History  Diagnosis Date  . Ventricular tachycardia   . AICD (automatic cardioverter/defibrillator) present     [employer] 100  . Tobacco abuse   . Alcohol abuse     hx  . Cardiomyopathy     secondary  . HTN (hypertension)     uncontrolled  . CHF (congestive heart failure)   . Gout    Past Surgical History  Procedure Laterality Date  . Cardiac defibrillator placement  2010    [employer]  . Embolectomy Right 06/04/2013    Procedure: Upper Extremity Thrombectomy;  Surgeon: Nada Libman, MD;  Location: [facility]  OR;  Service: Vascular;  Laterality: Right;   Family History  Problem Relation Age of Onset  . Cardiomyopathy Father     on a defib  . Diabetes Father    History  Substance Use Topics  . Smoking status: Current Every Day Smoker -- 0.50 packs/day for 30 years    Types: Cigarettes  . Smokeless tobacco: Never Used     Comment: I already have information on quitting"  . Alcohol Use: 0.6 oz/week    1 Cans of beer per week    Review of Systems  Constitutional: Negative for fever.  HENT: Negative for rhinorrhea.   Respiratory: Positive for cough and shortness of breath. Negative for wheezing.   Cardiovascular: Positive for chest pain (intermittent). Negative for syncope.  Skin: Negative for rash.  Neurological: Negative for headaches.  All other systems reviewed and are negative.     Allergies  Review of patient's allergies indicates no known allergies.  Home Medications   Prior to Admission medications   Medication Sig Start Date End Date Taking? Authorizing Provider  allopurinol (ZYLOPRIM) 100 MG tablet Take 100 mg by mouth daily.    Historical Provider, MD  aspirin 81 MG chewable tablet Chew 1 tablet (81 mg total) by mouth daily. Patient not taking: Reported on 01/14/2015 07/03/14   Azalia Bilis, MD  carvedilol (COREG) 6.25 MG tablet Take 6.25 mg by mouth daily.     Historical Provider, MD  digoxin (LANOXIN) 0.25 MG tablet Take 0.25 mg by mouth daily.  Historical Provider, MD  lisinopril (PRINIVIL,ZESTRIL) 20 MG tablet Take 20 mg by mouth daily.      Historical Provider, MD  Omega-3 Fatty Acids (OMEGA 3 PO) Take 1 tablet by mouth daily.    Historical Provider, MD  spironolactone (ALDACTONE) 25 MG tablet Take 25 mg by mouth daily.      Historical Provider, MD  torsemide (DEMADEX) 20 MG tablet Take 40 mg by mouth 2 (two) times daily.    Historical Provider, MD  vitamin B-12 (CYANOCOBALAMIN) 1000 MCG tablet Take 1,000 mcg by mouth daily.    Historical Provider, MD  warfarin  (COUMADIN) 10 MG tablet Take 10 mg by mouth daily.    Historical Provider, MD   BP 128/91 mmHg  Pulse 90  Temp(Src) 97.6 F (36.4 C) (Oral)  Resp 18  Ht  (1.702 m)  Wt 222 lb 4.8 oz (100.835 kg)  BMI 34.81 kg/m2  SpO2 100% Physical Exam  Constitutional: He is oriented to person, place, and time. He appears well-developed and well-nourished. No distress.  HENT:  Head: Normocephalic and atraumatic.  Mouth/Throat: No oropharyngeal exudate.  Eyes: EOM are normal. Pupils are equal, round, and reactive to light.  Neck: Normal range of motion. Neck supple.  Cardiovascular: Normal rate and regular rhythm.  Exam reveals no friction rub.   No murmur heard. Pulmonary/Chest: Effort normal. No respiratory distress. He has decreased breath sounds (mild, diffuse). He has wheezes (occasional). He has rales (very mild, bibasilar).  Diffuse pulmonary crackles, worse in lower lobes   Abdominal: He exhibits no distension. There is no tenderness. There is no rebound.  Musculoskeletal: Normal range of motion. He exhibits no edema.  Neurological: He is alert and oriented to person, place, and time. No cranial nerve deficit. He exhibits normal muscle tone. Coordination normal.  Skin: He is not diaphoretic.  Nursing note and vitals reviewed.   ED Course  Procedures (including critical care time) Labs Review Labs Reviewed  CBC  TROPONIN I  COMPREHENSIVE METABOLIC PANEL    Imaging Review Dg Chest 2 View  03/13/2015   CLINICAL DATA:  Two-month history of shortness of breath and 2 day history of cough.  EXAM: CHEST  2 VIEW  COMPARISON:  01/14/2015  FINDINGS: The heart is enlarged but stable. The pacer wires/AICD are stable. Mild chronic bronchitic changes are noted with peribronchial thickening and increased interstitial markings. No definite infiltrates or effusions. Thickened peripheral interstitial lines (Kerley B-lines) suggest mild interstitial edema. The bony thorax is intact.  IMPRESSION:  Stable cardiac enlargement.  Central vascular congestion and probable mild interstitial pulmonary edema.  No definite infiltrates or effusions.   Electronically Signed   By: Rudie Meyer M.D.   On: 03/13/2015 10:53     EKG Interpretation   Date/Time:  Sunday March 13 2015 10:05:59 EDT Ventricular Rate:  84 PR Interval:  159 QRS Duration: 100 QT Interval:  394 QTC Calculation: 466 R Axis:   -72 Text Interpretation:  Sinus rhythm Biatrial enlargement LAD, consider LAFB  or inferior infarct Probable anterolateral infarct, recent Baseline wander  in lead(s) V3 No significant change since last tracing Confirmed by Gwendolyn Grant   MD, Atlee Kluth ([id number]) on 03/13/2015 10:52:58 AM      MDM   Final diagnoses:  CHF exacerbation    49 year old male history of CHF presents with one-month of worsening symptoms. Hasn't had any medication changes. He states worsening shortness of breath with exertion, movement. He also has intermittent chest pain, none in the past several days.  No fevers or cough. Here vitals show some mild tachypnea and some occasional hypoxia. Post on 2 L nasal cannula for comfort. Labs show increase in BNP. He is getting exertional dyspnea just with sitting up and talking. With his hypoxia and exertional dyspnea, feel he needs admission. IV Lasix given. Dr. Sharyn Lull admitting.    Elwin Mocha, MD 03/14/15 [phone]

## 2015-03-13 NOTE — ED Notes (Signed)
SHANNON, FLOW MGR, TO VERIFY BED REQUEST - TELE VS STEPDOWN.

## 2015-03-13 NOTE — ED Notes (Signed)
Pt sitting on side of bed.  Saturations randomly drop to 88% and then rise to 96%.  Placed on 2L [place].

## 2015-03-13 NOTE — ED Notes (Signed)
Phlebotomy in w/pt. 

## 2015-03-13 NOTE — H&P (Signed)
Parker Johnson is an 49 y.o. male.   Chief Complaint: Progressive increasing shortness of breath associated with cough and streaky blood in sputum HPI: Patient is 49 year old male with past medical history significant for multiple medical problems i.e. 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 Patient states he has gained approximately 13 pounds denies any noncompliance to medication or excessive salty food intake. Denies any fever or chills. Denies chest pain nausea vomiting diaphoresis. Denies any palpitation lightheadedness or syncope. Denies any ICD discharges. Patient was noted to have elevated BNP of 2200.   Past Medical History  Diagnosis Date  . Ventricular tachycardia   . AICD (automatic cardioverter/defibrillator) present     [employer] 100  . Tobacco abuse   . Alcohol abuse     hx  . Cardiomyopathy     secondary  . HTN (hypertension)     uncontrolled  . CHF (congestive heart failure)   . Gout     Past Surgical History  Procedure Laterality Date  . Cardiac defibrillator placement  2010    [employer]  . Embolectomy Right 06/04/2013    Procedure: Upper Extremity Thrombectomy;  Surgeon: Serafina Mitchell, MD;  Location: [facility] OR;  Service: Vascular;  Laterality: Right;    Family History  Problem Relation Age of Onset  . Cardiomyopathy Father    on a defib  . Diabetes Father    Social History:  reports that he has been smoking Cigarettes.  He has a 15 pack-year smoking history. He has never used smokeless tobacco. He reports that he drinks about 0.6 oz of alcohol per week. He reports that he does not use illicit drugs.  Allergies: No Known Allergies   (Not in a hospital admission)  Results for orders placed or performed during the hospital encounter of 03/13/15 (from the past 48 hour(s))  CBC     Status: None   Collection Time: 03/13/15 10:23 AM  Result Value Ref Range   WBC 5.5 4.0 - 10.5 K/uL   RBC 4.86 4.22 - 5.81 MIL/uL   Hemoglobin 15.1 13.0 - 17.0 g/dL   HCT 45.9 39.0 - 52.0 %   MCV 94.4 78.0 - 100.0 fL   MCH 31.1 26.0 - 34.0 pg   MCHC 32.9 30.0 - 36.0 g/dL   RDW 14.7 11.5 - 15.5 %   Platelets 175 150 - 400 K/uL  Troponin I (MHP)     Status: Abnormal   Collection Time: 03/13/15 10:23 AM  Result Value Ref Range   Troponin I 0.05 (H) <0.031 ng/mL    Comment:        PERSISTENTLY INCREASED TROPONIN VALUES IN THE RANGE OF 0.04-0.49 ng/mL CAN BE SEEN IN:       -UNSTABLE ANGINA       -CONGESTIVE HEART FAILURE       -MYOCARDITIS       -CHEST TRAUMA       -ARRYHTHMIAS       -  LATE PRESENTING MYOCARDIAL INFARCTION       -COPD   CLINICAL FOLLOW-UP RECOMMENDED.   Comprehensive metabolic panel     Status: Abnormal   Collection Time: 03/13/15 10:23 AM  Result Value Ref Range   Sodium 138 135 - 145 mmol/L   Potassium 3.9 3.5 - 5.1 mmol/L   Chloride 100 96 - 112 mmol/L   CO2 26 19 - 32 mmol/L   Glucose, Bld 164 (H) 70 - 99 mg/dL   BUN 23 6 - 23 mg/dL   Creatinine, Ser 1.42 (H) 0.50 - 1.35 mg/dL   Calcium 9.2 8.4 - 10.5 mg/dL   Total Protein 6.8 6.0 - 8.3 g/dL   Albumin 3.6 3.5 - 5.2 g/dL   AST 22 0 - 37 U/L   ALT 18 0 - 53 U/L   Alkaline Phosphatase 74 39 - 117 U/L   Total Bilirubin 2.0 (H) 0.3 - 1.2 mg/dL   GFR calc non Af Amer 57 (L) >90 mL/min   GFR calc Af Amer 66 (L) >90 mL/min    Comment: (NOTE) The  eGFR has been calculated using the CKD EPI equation. This calculation has not been validated in all clinical situations. eGFR's persistently <90 mL/min signify possible Chronic Kidney Disease.    Anion gap 12 5 - 15  Brain natriuretic peptide     Status: Abnormal   Collection Time: 03/13/15 11:40 AM  Result Value Ref Range   B Natriuretic Peptide 2203.9 (H) 0.0 - 100.0 pg/mL  Digoxin level     Status: Abnormal   Collection Time: 03/13/15 11:40 AM  Result Value Ref Range   Digoxin Level 0.3 (L) 0.8 - 2.0 ng/mL  I-stat troponin, ED     Status: None   Collection Time: 03/13/15 11:43 AM  Result Value Ref Range   Troponin i, poc 0.03 0.00 - 0.08 ng/mL   Comment 3            Comment: Due to the release kinetics of cTnI, a negative result within the first hours of the onset of symptoms does not rule out myocardial infarction with certainty. If myocardial infarction is still suspected, repeat the test at appropriate intervals.    Dg Chest 2 View  03/13/2015   CLINICAL DATA:  Two-month history of shortness of breath and 2 day history of cough.  EXAM: CHEST  2 VIEW  COMPARISON:  01/14/2015  FINDINGS: The heart is enlarged but stable. The pacer wires/AICD are stable. Mild chronic bronchitic changes are noted with peribronchial thickening and increased interstitial markings. No definite infiltrates or effusions. Thickened peripheral interstitial lines (Kerley B-lines) suggest mild interstitial edema. The bony thorax is intact.  IMPRESSION: Stable cardiac enlargement.  Central vascular congestion and probable mild interstitial pulmonary edema.  No definite infiltrates or effusions.   Electronically Signed   By: Marijo Sanes M.D.   On: 03/13/2015 10:53    Review of Systems  Constitutional: Positive for malaise/fatigue. Negative for fever and chills.  HENT: Negative for hearing loss.   Eyes: Negative for double vision and photophobia.  Respiratory: Positive for cough, hemoptysis and shortness of  breath. Negative for sputum production.   Cardiovascular: Positive for leg swelling and PND. Negative for chest pain and palpitations.  Gastrointestinal: Negative for vomiting and abdominal pain.  Genitourinary: Negative for dysuria and urgency.  Neurological: Negative for dizziness and headaches.    Blood pressure 110/79, pulse 73, temperature 97.6 F (36.4 C), temperature source Oral, resp. rate 22, height 5' 7"  (1.702  m), weight 100.835 kg (222 lb 4.8 oz), SpO2 97 %. Physical Exam  Constitutional: He is oriented to person, place, and time.  HENT:  Head: Normocephalic and atraumatic.  Eyes: Conjunctivae are normal. Pupils are equal, round, and reactive to light. Left eye exhibits no discharge. No scleral icterus.  Neck: Normal range of motion. Neck supple. JVD present.  Cardiovascular: Regular rhythm.   Murmur (Soft systolic murmur and S3 gallop noted) heard. Respiratory:  Decreased breath sound at bases with bilateral faint rales noted  GI: Soft. Bowel sounds are normal. He exhibits no distension. There is no tenderness. There is no rebound.  Musculoskeletal:  No clubbing cyanosis trace edema noted  Neurological: He is alert and oriented to person, place, and time.     Assessment/Plan Acute on chronic decompensated systolic heart failure Minimally elevated troponin I doubt significant MI Severe nonischemic dilated Myopathy Hypertension History of paroxysmal nonsustained VT and A. 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

## 2015-03-13 NOTE — ED Notes (Signed)
Per Diplomatic Services operational officer, RN requested for RN to call her back in 5 min.

## 2015-03-13 NOTE — Progress Notes (Signed)
ANTICOAGULATION CONSULT NOTE - Initial Consult  Pharmacy Consult for warfarin, milrinone Indication: atrial fibrillation  No Known Allergies  Patient Measurements: Height: 5\' 7"  (170.2 cm) Weight: 222 lb 4.8 oz (100.835 kg) IBW/kg (Calculated) : 66.1  Vital Signs: Temp: 97.9 F (36.6 C) (04/17 1429) Temp Source: Oral (04/17 1429) BP: 123/91 mmHg (04/17 1515) Pulse Rate: 72 (04/17 1515)  Labs:  Recent Labs  03/13/15 1023  HGB 15.1  HCT 45.9  PLT 175  LABPROT 17.5*  INR 1.43  CREATININE 1.42*  TROPONINI 0.05*    Estimated Creatinine Clearance: 72 mL/min (by C-G formula based on Cr of 1.42).   Medical History: Past Medical History  Diagnosis Date  . Ventricular tachycardia   . AICD (automatic cardioverter/defibrillator) present     [employer] 100  . Tobacco abuse   . Alcohol abuse     hx  . Cardiomyopathy     secondary  . HTN (hypertension)     uncontrolled  . CHF (congestive heart failure)   . Gout    Assessment: 48 yom presented to the hospital with SOB. He is on chronic coumadin for afib. Of note pt has "streaky blood in sputum" but INR is subtherapeutic. CBC is WNL. Also to start milrinone. Scr elevated but CrCl >39ml/min.   Goal of Therapy:  INR 2-3 Monitor platelets by anticoagulation protocol: Yes   Plan:  - Warfarin 12.5mg  PO x 1 tonight - Daily INR - Milrinone 0.38mcg/kg/min - pharmacy will sign-off consult and defer any adjustments to cardiology  Andreea Arca, Drake Leach 03/13/2015,3:47 PM

## 2015-03-14 LAB — BASIC METABOLIC PANEL
Anion gap: 10 (ref 5–15)
BUN: 23 mg/dL (ref 6–23)
CALCIUM: 8.9 mg/dL (ref 8.4–10.5)
CO2: 29 mmol/L (ref 19–32)
Chloride: 98 mmol/L (ref 96–112)
Creatinine, Ser: 1.34 mg/dL (ref 0.50–1.35)
GFR calc Af Amer: 71 mL/min — ABNORMAL LOW (ref >90)
GFR, EST NON AFRICAN AMERICAN: 61 mL/min — AB (ref >90)
GLUCOSE: 122 mg/dL — AB (ref 70–99)
Potassium: 3.6 mmol/L (ref 3.5–5.1)
SODIUM: 137 mmol/L (ref 135–145)

## 2015-03-14 LAB — PROTIME-INR
INR: 1.48 (ref 0.00–1.49)
Prothrombin Time: 18.1 seconds — ABNORMAL HIGH (ref 11.6–15.2)

## 2015-03-14 LAB — BRAIN NATRIURETIC PEPTIDE: B Natriuretic Peptide: 1511.5 pg/mL — ABNORMAL HIGH (ref 0.0–100.0)

## 2015-03-14 MED ORDER — WARFARIN SODIUM 10 MG PO TABS
12.5000 mg | ORAL_TABLET | Freq: Once | ORAL | Status: AC
  Administered 2015-03-14: 12.5 mg via ORAL
  Filled 2015-03-14: qty 1

## 2015-03-14 NOTE — Progress Notes (Signed)
Subjective:  Denies any chest pain states breathing has improved no further hemoptysis toleratingCoumadin . Good diuresis on IV milrinone  Objective:  Vital Signs in the last 24 hours: Temp:  [97.3 F (36.3 C)-98.6 F (37 C)] 97.3 F (36.3 C) (04/18 0800) Pulse Rate:  [56-90] 73 (04/18 0800) Resp:  [16-29] 20 (04/18 0800) BP: (95-136)/(53-99) 123/74 mmHg (04/18 0800) SpO2:  [95 %-100 %] 98 % (04/18 0800) Weight:  [97.75 kg (215 lb 8 oz)-100.835 kg (222 lb 4.8 oz)] 97.75 kg (215 lb 8 oz) (04/18 0700)  Intake/Output from previous day: 04/17 0701 - 04/18 0700 In: 758.5 [P.O.:480; I.V.:278.5] Out: 3520 [Urine:3520] Intake/Output from this shift: Total I/O In: 63.9 [I.V.:63.9] Out: -   Physical Exam: Neck: no adenopathy, no carotid bruit, no JVD and supple, symmetrical, trachea midline Lungs: decreased breath sounds at bases with faint rales Heart: regular rate and rhythm, S1, S2 normal and soft systolic murmur and S3 gallop noted Abdomen: soft, non-tender; bowel sounds normal; no masses,  no organomegaly Extremities: extremities normal, atraumatic, no cyanosis or edema  Lab Results:  Recent Labs  03/13/15 1023 03/13/15 1545  WBC 5.5 6.0  HGB 15.1 15.1  PLT 175 184    Recent Labs  03/13/15 1545 03/14/15 0249  NA 138 137  K 3.5 3.6  CL 99 98  CO2 29 29  GLUCOSE 103* 122*  BUN 21 23  CREATININE 1.38* 1.34    Recent Labs  03/13/15 1023  TROPONINI 0.05*   Hepatic Function Panel  Recent Labs  03/13/15 1545  PROT 6.8  ALBUMIN 3.3*  AST 20  ALT 20  ALKPHOS 76  BILITOT 1.9*   No results for input(s): CHOL in the last 72 hours. No results for input(s): PROTIME in the last 72 hours.  Imaging: Imaging results have been reviewed and Dg Chest 2 View  03/13/2015   CLINICAL DATA:  Two-month history of shortness of breath and 2 day history of cough.  EXAM: CHEST  2 VIEW  COMPARISON:  01/14/2015  FINDINGS: The heart is enlarged but stable. The pacer wires/AICD  are stable. Mild chronic bronchitic changes are noted with peribronchial thickening and increased interstitial markings. No definite infiltrates or effusions. Thickened peripheral interstitial lines (Kerley B-lines) suggest mild interstitial edema. The bony thorax is intact.  IMPRESSION: Stable cardiac enlargement.  Central vascular congestion and probable mild interstitial pulmonary edema.  No definite infiltrates or effusions.   Electronically Signed   By: Rudie Meyer M.D.   On: 03/13/2015 10:53    Cardiac Studies:  Assessment/Plan:  Acute on chronic decompensated systolic heart failure Minimally elevated troponin I doubt significant MI Severe nonischemic dilated Myopathy Hypertension History of paroxysmal nonsustained VT and A. 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

## 2015-03-14 NOTE — Progress Notes (Signed)
ANTICOAGULATION CONSULT NOTE - Follow Up Consult  Pharmacy Consult for Warfarin Indication: atrial fibrillation  No Known Allergies  Patient Measurements: Height: 5\' 7"  (170.2 cm) Weight: 215 lb 8 oz (97.75 kg) IBW/kg (Calculated) : 66.1  Vital Signs: Temp: 97.4 F (36.3 C) (04/18 1252) Temp Source: Oral (04/18 1252) BP: 90/62 mmHg (04/18 1252) Pulse Rate: 64 (04/18 1252)  Labs:  Recent Labs  03/13/15 1023 03/13/15 1545 03/14/15 0249  HGB 15.1 15.1  --   HCT 45.9 45.1  --   PLT 175 184  --   APTT  --  29  --   LABPROT 17.5* 17.4* 18.1*  INR 1.43 1.41 1.48  CREATININE 1.42* 1.38* 1.34  TROPONINI 0.05*  --   --     Estimated Creatinine Clearance: 75.1 mL/min (by C-G formula based on Cr of 1.34).   Medications:  Scheduled:  . allopurinol  100 mg Oral Daily  . carvedilol  3.125 mg Oral BID WC  . digoxin  0.25 mg Oral Daily  . furosemide  80 mg Intravenous BID  . sodium chloride  3 mL Intravenous Q12H  . warfarin  12.5 mg Oral ONCE-1800  . Warfarin - Pharmacist Dosing Inpatient   Does not apply q1800    Assessment: 48yoM presented w/ SOB in heart failure exacerbation. On chronic warfarin PTA for atrial fibrillation. INR subtherapeutic on admission at 1.41. Pharmacy consulted to continue warfarin. INR today 1.48 after 12.5mg  given last night, would not expect effect of last nights dose to be seen at this time. Hgb 15.1, plts 175. Of note pt has streaky blood in sputum, but no other s/sx of bleeding noted, most likely not due to warfarin since INR is subtherapeutic.  Goal of Therapy:  INR 2-3 Monitor platelets by anticoagulation protocol: Yes   Plan:  Warfarin 12.5mg  x 1 tonight Daily INR/PT Monitor s/sx of bleeding  Thank you for allowing pharmacy to be part of this patient's care team  Ryman Rathgeber M. Antia Rahal, Pharm.D Clinical Pharmacy Resident Pager: [phone] 03/14/2015 .3:22 PM

## 2015-03-14 NOTE — Care Management Note (Signed)
    Page 1 of 1   03/14/2015     2:27:27 PM CARE MANAGEMENT NOTE 03/14/2015  Patient:  Johnson, Parker   Account Number:  [id number]  Date Initiated:  03/14/2015  Documentation initiated by:  Junius Creamer  Subjective/Objective Assessment:   adm w heart failure     Action/Plan:   lives w fam, pcp dr Sharyn Lull   Anticipated DC Date:     Anticipated DC Plan:  HOME W HOME HEALTH SERVICES         Choice offered to / List presented to:             Status of service:   Medicare Important Message given?   (If response is "NO", the following Medicare IM given date fields will be blank) Date Medicare IM given:   Medicare IM given by:   Date Additional Medicare IM given:   Additional Medicare IM given by:    Discharge Disposition:    Per UR Regulation:  Reviewed for med. necessity/level of care/duration of stay  If discussed at Long Length of Stay Meetings, dates discussed:    Comments:

## 2015-03-15 LAB — BASIC METABOLIC PANEL
Anion gap: 9 (ref 5–15)
BUN: 19 mg/dL (ref 6–23)
CO2: 30 mmol/L (ref 19–32)
CREATININE: 1.18 mg/dL (ref 0.50–1.35)
Calcium: 8.9 mg/dL (ref 8.4–10.5)
Chloride: 98 mmol/L (ref 96–112)
GFR calc Af Amer: 83 mL/min — ABNORMAL LOW (ref >90)
GFR calc non Af Amer: 71 mL/min — ABNORMAL LOW (ref >90)
Glucose, Bld: 105 mg/dL — ABNORMAL HIGH (ref 70–99)
Potassium: 3.8 mmol/L (ref 3.5–5.1)
Sodium: 137 mmol/L (ref 135–145)

## 2015-03-15 LAB — PROTIME-INR
INR: 1.53 — AB (ref 0.00–1.49)
Prothrombin Time: 18.5 seconds — ABNORMAL HIGH (ref 11.6–15.2)

## 2015-03-15 MED ORDER — LISINOPRIL 5 MG PO TABS
5.0000 mg | ORAL_TABLET | Freq: Every day | ORAL | Status: DC
  Administered 2015-03-15 – 2015-03-17 (×3): 5 mg via ORAL
  Filled 2015-03-15 (×3): qty 1

## 2015-03-15 MED ORDER — WARFARIN SODIUM 2.5 MG PO TABS
12.5000 mg | ORAL_TABLET | Freq: Once | ORAL | Status: AC
  Administered 2015-03-15: 12.5 mg via ORAL
  Filled 2015-03-15: qty 1

## 2015-03-15 MED ORDER — FUROSEMIDE 10 MG/ML IJ SOLN
40.0000 mg | Freq: Two times a day (BID) | INTRAMUSCULAR | Status: DC
  Administered 2015-03-15 – 2015-03-17 (×4): 40 mg via INTRAVENOUS
  Filled 2015-03-15 (×5): qty 4

## 2015-03-15 NOTE — Progress Notes (Signed)
Subjective:  Denies any chest pain states breathing is improved complains of occasional epistaxis.  Objective:  Vital Signs in the last 24 hours: Temp:  [97.4 F (36.3 C)-98.4 F (36.9 C)] 97.9 F (36.6 C) (04/19 1100) Pulse Rate:  [64-80] 77 (04/19 1100) Resp:  [16-18] 18 (04/19 1100) BP: (87-124)/(59-91) 114/82 mmHg (04/19 1100) SpO2:  [96 %-98 %] 96 % (04/19 1100) Weight:  [97.75 kg (215 lb 8 oz)] 97.75 kg (215 lb 8 oz) (04/19 0300)  Intake/Output from previous day: 04/18 0701 - 04/19 0700 In: 1194.2 [P.O.:680; I.V.:514.2] Out: 1710 [Urine:1710] Intake/Output from this shift: Total I/O In: 642.6 [P.O.:600; I.V.:42.6] Out: 300 [Urine:300]  Physical Exam: Neck: no adenopathy, no carotid bruit, no JVD and supple, symmetrical, trachea midline Lungs: Decreased breath sound at bases Heart: regular rate and rhythm, S1, S2 normal and Soft systolic murmur and S3 gallop noted Abdomen: soft, non-tender; bowel sounds normal; no masses,  no organomegaly Extremities: extremities normal, atraumatic, no cyanosis or edema  Lab Results:  Recent Labs  03/13/15 1023 03/13/15 1545  WBC 5.5 6.0  HGB 15.1 15.1  PLT 175 184    Recent Labs  03/14/15 0249 03/15/15 0345  NA 137 137  K 3.6 3.8  CL 98 98  CO2 29 30  GLUCOSE 122* 105*  BUN 23 19  CREATININE 1.34 1.18    Recent Labs  03/13/15 1023  TROPONINI 0.05*   Hepatic Function Panel  Recent Labs  03/13/15 1545  PROT 6.8  ALBUMIN 3.3*  AST 20  ALT 20  ALKPHOS 76  BILITOT 1.9*   No results for input(s): CHOL in the last 72 hours. No results for input(s): PROTIME in the last 72 hours.  Imaging: Imaging results have been reviewed and No results found.  Cardiac Studies:  Assessment/Plan:  Acute on chronic decompensated systolic heart failure Minimally elevated troponin I doubt significant MI Severe nonischemic dilated Myopathy Hypertension History of paroxysmal nonsustained VT and A. fib in the past History  of cardiogenic non-Q-wave MI in the past History of cardiac emboli to right arm status post thrombectomy in July 2014 History of EtOH/tobacco/polysubstance abuse in the past History of gouty arthritis Acute renal insufficiency secondary to cardiorenal syndrome. Improved Plan Restart low-dose Ace inhibitors  Reduce Lasix as per orders Check labs in a.m. Coumadin per pharmacy   LOS: 2 days    Parker Johnson 03/15/2015, 11:58 AM

## 2015-03-15 NOTE — Progress Notes (Signed)
ANTICOAGULATION CONSULT NOTE - Follow Up Consult  Pharmacy Consult for warfarin Indication: atrial fibrillation  No Known Allergies  Patient Measurements: Height: 5\' 7"  (170.2 cm) Weight: 215 lb 8 oz (97.75 kg) IBW/kg (Calculated) : 66.1  Vital Signs: Temp: 97.9 F (36.6 C) (04/19 1100) Temp Source: Oral (04/19 1100) BP: 114/82 mmHg (04/19 1100) Pulse Rate: 77 (04/19 1100)  Labs:  Recent Labs  03/13/15 1023 03/13/15 1545 03/14/15 0249 03/15/15 0345  HGB 15.1 15.1  --   --   HCT 45.9 45.1  --   --   PLT 175 184  --   --   APTT  --  29  --   --   LABPROT 17.5* 17.4* 18.1* 18.5*  INR 1.43 1.41 1.48 1.53*  CREATININE 1.42* 1.38* 1.34 1.18  TROPONINI 0.05*  --   --   --     Estimated Creatinine Clearance: 85.3 mL/min (by C-G formula based on Cr of 1.18).   Medications:  Scheduled:  . allopurinol  100 mg Oral Daily  . carvedilol  3.125 mg Oral BID WC  . digoxin  0.25 mg Oral Daily  . furosemide  40 mg Intravenous BID  . lisinopril  5 mg Oral Daily  . sodium chloride  3 mL Intravenous Q12H  . Warfarin - Pharmacist Dosing Inpatient   Does not apply q1800    Assessment: 48yoM presented w/ SOB in heart failure exacerbation. On chronic warfarin PTA for atrial fibrillation. INR subtherapeutic on admission at 1.41. Pharmacy consulted to continue warfarin. INR today 1.53 after 12.5mg  given twice. Hgb 15.1, plts 184. No bleeding noted.  Goal of Therapy:  INR 2-3 Monitor platelets by anticoagulation protocol: Yes   Plan: Warfarin 12.5mg  x 1 tonight Daily INR/PT Monitor s/sx of bleeding  Thank you for allowing pharmacy to be part of this patient's care team  Korie Streat M. Jacorey Donaway, Pharm.D Clinical Pharmacy Resident Pager: [phone] 03/15/2015 .1:46 PM

## 2015-03-16 LAB — BASIC METABOLIC PANEL
Anion gap: 13 (ref 5–15)
BUN: 18 mg/dL (ref 6–23)
CALCIUM: 9.2 mg/dL (ref 8.4–10.5)
CO2: 24 mmol/L (ref 19–32)
CREATININE: 1.19 mg/dL (ref 0.50–1.35)
Chloride: 98 mmol/L (ref 96–112)
GFR calc non Af Amer: 71 mL/min — ABNORMAL LOW (ref >90)
GFR, EST AFRICAN AMERICAN: 82 mL/min — AB (ref >90)
GLUCOSE: 134 mg/dL — AB (ref 70–99)
Potassium: 4.1 mmol/L (ref 3.5–5.1)
Sodium: 135 mmol/L (ref 135–145)

## 2015-03-16 LAB — PROTIME-INR
INR: 1.67 — AB (ref 0.00–1.49)
Prothrombin Time: 19.8 seconds — ABNORMAL HIGH (ref 11.6–15.2)

## 2015-03-16 LAB — BRAIN NATRIURETIC PEPTIDE: B Natriuretic Peptide: 483.4 pg/mL — ABNORMAL HIGH (ref 0.0–100.0)

## 2015-03-16 MED ORDER — WARFARIN SODIUM 7.5 MG PO TABS
15.0000 mg | ORAL_TABLET | Freq: Once | ORAL | Status: AC
  Administered 2015-03-16: 15 mg via ORAL
  Filled 2015-03-16: qty 2

## 2015-03-16 NOTE — Progress Notes (Signed)
Subjective:  Patient denies any chest pain breathing has markedly improved no further epistaxis tolerating Coumadin okay. BNP trending down  Objective:  Vital Signs in the last 24 hours: Temp:  [97.5 F (36.4 C)-97.8 F (36.6 C)] 97.5 F (36.4 C) (04/20 1528) Pulse Rate:  [77-81] 81 (04/20 1734) Resp:  [15-30] 24 (04/20 1300) BP: (99-142)/(58-82) 107/66 mmHg (04/20 1736) SpO2:  [95 %-96 %] 96 % (04/20 1528) Weight:  [96.6 kg (212 lb 15.4 oz)] 96.6 kg (212 lb 15.4 oz) (04/20 0400)  Intake/Output from previous day: 04/19 0701 - 04/20 0700 In: 383.4 [I.V.:383.4] Out: 2400 [Urine:2400] Intake/Output from this shift: Total I/O In: 641.7 [P.O.:450; I.V.:191.7] Out: 850 [Urine:850]  Physical Exam: Neck: no adenopathy, no carotid bruit, no JVD and supple, symmetrical, trachea midline Lungs: Decrease   breath sounds at bases air entry has improved Heart: regular rate and rhythm, S1, S2 normal and Soft systolic murmur and S3 gallop noted Abdomen: soft, non-tender; bowel sounds normal; no masses,  no organomegaly Extremities: extremities normal, atraumatic, no cyanosis or edema  Lab Results: No results for input(s): WBC, HGB, PLT in the last 72 hours.  Recent Labs  03/15/15 0345 03/16/15 0220  NA 137 135  K 3.8 4.1  CL 98 98  CO2 30 24  GLUCOSE 105* 134*  BUN 19 18  CREATININE 1.18 1.19   No results for input(s): TROPONINI in the last 72 hours.  Invalid input(s): CK, MB Hepatic Function Panel No results for input(s): PROT, ALBUMIN, AST, ALT, ALKPHOS, BILITOT, BILIDIR, IBILI in the last 72 hours. No results for input(s): CHOL in the last 72 hours. No results for input(s): PROTIME in the last 72 hours.  Imaging: Imaging results have been reviewed and No results found.  Cardiac Studies:  Assessment/Plan:  Acute on chronic decompensated systolic heart failure Minimally elevated troponin I doubt significant MI Severe nonischemic dilated Myopathy Hypertension History of  paroxysmal nonsustained VT and A. 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 Improved.  Plan Wean  off milrinone as per orders Possible discharge tomorrow stable   LOS: 3 days    Marbeth Smedley N 03/16/2015, 6:20 PM

## 2015-03-16 NOTE — Progress Notes (Signed)
ANTICOAGULATION CONSULT NOTE - Follow Up Consult  Pharmacy Consult for warfarin Indication: atrial fibrillation  No Known Allergies  Patient Measurements: Height: 5\' 7"  (170.2 cm) Weight: 212 lb 15.4 oz (96.6 kg) IBW/kg (Calculated) : 66.1  Vital Signs: BP: 104/77 mmHg (04/20 0445)  Labs:  Recent Labs  03/13/15 1023 03/13/15 1545 03/14/15 0249 03/15/15 0345 03/16/15 0220  HGB 15.1 15.1  --   --   --   HCT 45.9 45.1  --   --   --   PLT 175 184  --   --   --   APTT  --  29  --   --   --   LABPROT 17.5* 17.4* 18.1* 18.5* 19.8*  INR 1.43 1.41 1.48 1.53* 1.67*  CREATININE 1.42* 1.38* 1.34 1.18 1.19  TROPONINI 0.05*  --   --   --   --     Estimated Creatinine Clearance: 84.1 mL/min (by C-G formula based on Cr of 1.19).   Medications:  Scheduled:  . allopurinol  100 mg Oral Daily  . carvedilol  3.125 mg Oral BID WC  . digoxin  0.25 mg Oral Daily  . furosemide  40 mg Intravenous BID  . lisinopril  5 mg Oral Daily  . sodium chloride  3 mL Intravenous Q12H  . Warfarin - Pharmacist Dosing Inpatient   Does not apply q1800    Assessment: 48yoM presented w/ SOB in heart failure exacerbation. On chronic warfarin PTA for atrial fibrillation. INR subtherapeutic on admission at 1.41. Pharmacy consulted to continue warfarin. INR today 1.67 after 12.5mg  x 3. Hgb 15.1, plts 184. No bleeding noted.   PTA dosing 10mg  daily.  Goal of Therapy:  INR 2-3 Monitor platelets by anticoagulation protocol: Yes   Plan: Warfarin 15mg  x 1 tonight Daily INR/PT CBC tomorrow AM Monitor s/sx of bleeding  Thank you for allowing pharmacy to be part of this patient's care team  Kari Montero M. Isaiha Asare, Pharm.D Clinical Pharmacy Resident Pager: [phone] 03/16/2015 .7:33 AM

## 2015-03-17 LAB — BASIC METABOLIC PANEL
Anion gap: 12 (ref 5–15)
BUN: 19 mg/dL (ref 6–23)
CO2: 24 mmol/L (ref 19–32)
Calcium: 8.8 mg/dL (ref 8.4–10.5)
Chloride: 98 mmol/L (ref 96–112)
Creatinine, Ser: 1.16 mg/dL (ref 0.50–1.35)
GFR calc Af Amer: 84 mL/min — ABNORMAL LOW (ref >90)
GFR calc non Af Amer: 73 mL/min — ABNORMAL LOW (ref >90)
GLUCOSE: 110 mg/dL — AB (ref 70–99)
POTASSIUM: 4 mmol/L (ref 3.5–5.1)
Sodium: 134 mmol/L — ABNORMAL LOW (ref 135–145)

## 2015-03-17 LAB — CBC
HCT: 48.3 % (ref 39.0–52.0)
HEMOGLOBIN: 16.7 g/dL (ref 13.0–17.0)
MCH: 31.9 pg (ref 26.0–34.0)
MCHC: 34.6 g/dL (ref 30.0–36.0)
MCV: 92.2 fL (ref 78.0–100.0)
Platelets: 182 10*3/uL (ref 150–400)
RBC: 5.24 MIL/uL (ref 4.22–5.81)
RDW: 14.2 % (ref 11.5–15.5)
WBC: 5.6 10*3/uL (ref 4.0–10.5)

## 2015-03-17 LAB — PROTIME-INR
INR: 1.97 — ABNORMAL HIGH (ref 0.00–1.49)
Prothrombin Time: 22.6 seconds — ABNORMAL HIGH (ref 11.6–15.2)

## 2015-03-17 MED ORDER — WARFARIN SODIUM 2.5 MG PO TABS
12.5000 mg | ORAL_TABLET | Freq: Once | ORAL | Status: DC
  Filled 2015-03-17: qty 1

## 2015-03-17 NOTE — Progress Notes (Addendum)
ANTICOAGULATION CONSULT NOTE - Follow Up Consult  Pharmacy Consult for warfarin Indication: atrial fibrillation  No Known Allergies  Patient Measurements: Height: 5\' 7"  (170.2 cm) Weight: 214 lb 8.1 oz (97.3 kg) IBW/kg (Calculated) : 66.1  Vital Signs: Temp: 97.7 F (36.5 C) (04/21 0709) Temp Source: Oral (04/21 0709) BP: 119/73 mmHg (04/21 0709)  Labs:  Recent Labs  03/15/15 0345 03/16/15 0220 03/17/15 0435  HGB  --   --  16.7  HCT  --   --  48.3  PLT  --   --  182  LABPROT 18.5* 19.8* 22.6*  INR 1.53* 1.67* 1.97*  CREATININE 1.18 1.19 1.16    Estimated Creatinine Clearance: 86.6 mL/min (by C-G formula based on Cr of 1.16).   Medications:  Scheduled:  . allopurinol  100 mg Oral Daily  . carvedilol  3.125 mg Oral BID WC  . digoxin  0.25 mg Oral Daily  . furosemide  40 mg Intravenous BID  . lisinopril  5 mg Oral Daily  . sodium chloride  3 mL Intravenous Q12H  . Warfarin - Pharmacist Dosing Inpatient   Does not apply q1800    Assessment: 48yoM presented w/ SOB in heart failure exacerbation. On chronic warfarin PTA for atrial fibrillation. INR subtherapeutic on admission at 1.41. Pharmacy consulted to continue warfarin. INR 1.97 s/p 12.5mg  x 3 and 15mg  x 1, Hgb 16.7, plts 182. - no bleeding noted.  Goal of Therapy:  INR 2-3 Monitor platelets by anticoagulation protocol: Yes   Plan:  Warfarin 12.5mg  x 1 tonight Daily INR/PT Monitor s/sx of bleeding Recommend 12.5mg  daily upon discharge and follow up in clinic by 4/28 at the latest  Thank you for allowing pharmacy to be part of this patient's care team  Kenedie Dirocco M. Kemari Narez, Pharm.D Clinical Pharmacy Resident Pager: [phone] 03/17/2015 .7:30 AM

## 2015-03-17 NOTE — Discharge Summary (Signed)
Discharge summary dictated on 03/17/2015 dictation number is [phone]

## 2015-03-17 NOTE — Discharge Instructions (Signed)

## 2015-03-17 NOTE — Progress Notes (Signed)
Discharge paperwork discussed with the patient. Pt states understanding with medications, activity restrictions, and diet. No further questions. Belongings sent home with patient. Physician states patient is fit to drive himself home. Will discharge patient shortly.

## 2015-03-18 NOTE — Discharge Summary (Signed)
NAME:  RUFUS, BESKE NO.:  [id number]  MEDICAL RECORD NO.:  [id number]  LOCATION:  2H16C                        FACILITY:  [facility]  PHYSICIAN:  Aaleyah Witherow N. Sharyn Lull, M.D. DATE OF BIRTH:  07/17/1966  DATE OF ADMISSION:  03/13/2015 DATE OF DISCHARGE:  03/17/2015                              DISCHARGE SUMMARY   ADMITTING DIAGNOSES: 1. Acute on chronic decompensated systolic heart failure.  Minimally     elevated troponin I secondary to above, doubt significant     myocardial infarction. 2. Severe nonischemic dilated cardiomyopathy. 3. Hypertension. 4. History of paroxysmal nonsustained ventricular tachycardia and     atrial fibrillation in the past. 5. History of cardiogenic non-Q-wave myocardial infarction. 6. History of cardioemboli to right arm, status post thrombectomy in     July 2014. 7. History of alcohol abuse, tobacco abuse, polysubstance abuse in the     past. 8. History of gouty arthritis. 9. Acute renal insufficiency secondary to cardiorenal syndrome.  DISCHARGE DIAGNOSES: 1. Compensated systolic congestive heart failure.  Minimally elevated     troponin I secondary to above. 2. Severe nonischemic dilated cardiomyopathy. 3. Hypertension. 4. History of paroxysmal nonsustained ventricular tachycardia and     atrial fibrillation in the past. 5. History of cardiogenic non-Q-wave myocardial infarction in the     past. 6. History of cardioemboli to the right arm, status post thrombectomy     in July 2014. 7. History of alcohol abuse, tobacco abuse, and polysubstance abuse in     the past. 8. History of gouty arthritis. 9. Status post acute renal injury secondary to cardiorenal syndrome.  DISCHARGE HOME MEDICATIONS: 1. Allopurinol 100 mg daily. 2. Aspirin 81 mg 1 tablet daily. 3. Carvedilol 6.25 mg twice daily. 4. Digoxin 0.2 mg daily. 5. Lisinopril 20 mg daily. 6. Omega-3 one tablet daily. 7. Aldactone 25 mg daily. 8. Torsemide 40 mg twice  daily. 9. Vitamin B12 1000 mcg daily. 10.Warfarin 10 mg daily.  DIET:  Low salt, low cholesterol.  The patient has been advised to restrict fluid to 1 L per 24 hours and monitor weight daily.  CONDITION AT DISCHARGE:  Stable.  Check PT/INR in 1 week.  BRIEF HISTORY AND HOSPITAL COURSE:  Mr. Matney is a 49 year old male with past medical history significant for multiple medical problems, i.e., severe nonischemic dilated cardiomyopathy, status post ICD in the past; history of recurrent congestive heart failure secondary to depressed LV systolic function; hypertension; history of paroxysmal nonsustained VT and AFib in the past; history of cardiogenic non-Q-wave myocardial infarction in the past; history of cardioemboli to right arm, status post thrombectomy in July 2014; history of alcohol abuse, tobacco abuse, polysubstance abuse in the past; history of gouty arthritis.  He came to the ER complaining of progressive increasing shortness of breath associated with feeling weak, tired, fatigued, cough, streaky blood tinged sputum.  The patient says he has gained approximately 13 pounds. Denies any noncompliance to medication or excessive salty food intake. Denies any fever or chills.  Denies chest pain, nausea, vomiting, diaphoresis.  Denies palpitation, lightheadedness, or syncope.  Denies any ICD discharges.  The patient was noted to have elevated BNP of 2200.  PHYSICAL EXAMINATION:  VITAL SIGNS:  His blood pressure was 110/79, pulse 73, he was afebrile. EYES:  Conjunctiva was pink. NECK:  Supple.  Positive JVD. LUNGS:  Decreased breath sounds at bases with bilateral faint rales. CARDIOVASCULAR:  Regular rate and rhythm.  S1, S2 soft.  There was soft S3 gallop. ABDOMEN:  Soft.  Bowel sounds were present. EXTREMITIES:  No clubbing, cyanosis.  Trace edema was noted.  LABORATORY DATA:  Sodium was 138, potassium 3.9, BUN 23, creatinine 1.42.  Repeat creatinine on March 14, 2015, BUN was  23, creatinine 1.32. Today, BUN is 19, creatinine 1.16.  His troponin I was 0.05, which was minimally elevated.  Repeat troponin I was normal.  BNP was 2203, repeat was 2239, then 1511, today is 483 which is trending down.  Hemoglobin is 12.5, hematocrit 45.9, white count of 5.5.  Today, hemoglobin is 16.7, hematocrit 48.3, white count 5.6.  His PT was 17.5, INR 1.43.  Today, PT is 22.6, INR 1.97.  BRIEF HOSPITAL COURSE:  The patient was admitted to step-down unit, was started on IV Milrinone and Lasix was switched to IV Lasix with good diuresis.  His Lasix dose has been reduced.  The patient was restarted on his ACE inhibitors and Milrinone has been weaned off.  The patient lost approximately 10-12 pounds during the hospital stay.  His breathing has markedly improved.  His leg swelling has completely resolved.  Heart failure instructions have been given.  The patient has been advised to follow up with me in 1 week and follow up with EP as scheduled.     Eduardo Osier. Sharyn Lull, M.D.     [facility]  D:  03/17/2015  T:  03/18/2015  Job:  [id number]

## 2015-04-14 ENCOUNTER — Emergency Department (HOSPITAL_COMMUNITY)
Admission: EM | Admit: 2015-04-14 | Discharge: 2015-04-14 | Disposition: A | Discharge status: Home / Self Care | Payer: Medicaid Other | Source: Home / Self Care | Attending: Family Medicine | Admitting: Family Medicine

## 2015-04-14 ENCOUNTER — Encounter (HOSPITAL_COMMUNITY): Payer: Self-pay | Admitting: Family Medicine

## 2015-04-14 ENCOUNTER — Emergency Department (INDEPENDENT_AMBULATORY_CARE_PROVIDER_SITE_OTHER): Payer: Medicaid Other

## 2015-04-14 DIAGNOSIS — M109 Gout, unspecified: Secondary | ICD-10-CM

## 2015-04-14 DIAGNOSIS — Z7901 Long term (current) use of anticoagulants: Secondary | ICD-10-CM

## 2015-04-14 DIAGNOSIS — J441 Chronic obstructive pulmonary disease with (acute) exacerbation: Secondary | ICD-10-CM

## 2015-04-14 DIAGNOSIS — M10072 Idiopathic gout, left ankle and foot: Secondary | ICD-10-CM

## 2015-04-14 MED ORDER — PREDNISONE 50 MG PO TABS: ORAL_TABLET | ORAL | Status: DC

## 2015-04-14 MED ORDER — COLCHICINE 0.6 MG PO TABS: ORAL_TABLET | ORAL | Status: DC

## 2015-04-14 MED ORDER — DOXYCYCLINE HYCLATE 100 MG PO TABS: 100.0000 mg | ORAL_TABLET | Freq: Two times a day (BID) | ORAL | Status: DC

## 2015-04-14 MED ORDER — ALBUTEROL SULFATE HFA 108 (90 BASE) MCG/ACT IN AERS: 2.0000 | INHALATION_SPRAY | Freq: Four times a day (QID) | RESPIRATORY_TRACT | Status: DC | PRN

## 2015-04-14 MED ORDER — SPACER/AERO-HOLDING CHAMBERS DEVI: Status: DC

## 2015-04-14 NOTE — Discharge Instructions (Signed)
You are likely suffering from gout as well as a COPD exacerbation. Please start the colchicine for your gout. Please use these steroids, albuterol and doxycycline for your breathing. There is no sign of pneumonia or worsening heart failure. Please follow-up with your regular doctor to discuss further management and possible PFTs for your breathing.

## 2015-04-14 NOTE — ED Notes (Signed)
Patient c/o left foot gout x 3 days. Patient reports he has been taking his Rx as prescribed. Patient also c/o cough. He reports his wife has been recovering from pnuemonia. Patient is in NAD.

## 2015-04-14 NOTE — ED Provider Notes (Addendum)
CSN: [id number]     Arrival date & time 04/14/15  8657 History   None    Chief Complaint  Patient presents with  . Gout  . Cough   (Consider location/radiation/quality/duration/timing/severity/associated sxs/prior Treatment) HPI  L foot pain: 4 days ago developed L foot pain. Increased red meat in diet lately. Ankle region which is typical location fo rpt. Constant and getting worse. Taking Allopurinol as prescribed.   Cough: started 2 weeks ago. Wife w/ pneumonia . Worse w/ smoking. Overall unchanged since onset. Worse w/ laying down. CHills but no fever. Denies chest pain, dyspnea, lower extremity swelling above baseline, palpitations, headache, LOC.  Past Medical History  Diagnosis Date  . Ventricular tachycardia   . AICD (automatic cardioverter/defibrillator) present     [employer] 100  . Tobacco abuse   . Alcohol abuse     hx  . Cardiomyopathy     secondary  . HTN (hypertension)     uncontrolled  . CHF (congestive heart failure)   . Gout    Past Surgical History  Procedure Laterality Date  . Cardiac defibrillator placement  2010    [employer] 100  . Embolectomy Right 06/04/2013    Procedure: Upper Extremity Thrombectomy;  Surgeon: Nada Libman, MD;  Location: [facility] OR;  Service: Vascular;  Laterality: Right;   Family History  Problem Relation Age of Onset  . Cardiomyopathy Father     on a defib  . Diabetes Father    History  Substance Use Topics  . Smoking status: Current Every Day Smoker -- 0.50 packs/day for 30 years    Types: Cigarettes  . Smokeless tobacco: Never Used     Comment: I already have information on quitting"  . Alcohol Use: 0.6 oz/week    1 Cans of beer per week    Review of Systems Per HPI with all other pertinent systems negative.   Allergies  Review of patient's allergies indicates no known allergies.  Home Medications   Prior to Admission medications   Medication Sig Start Date End Date Taking?  Authorizing Provider  albuterol (PROVENTIL HFA;VENTOLIN HFA) 108 (90 BASE) MCG/ACT inhaler Inhale 2 puffs into the lungs every 6 (six) hours as needed for wheezing or shortness of breath. 04/14/15   Ozella Rocks, MD  allopurinol (ZYLOPRIM) 100 MG tablet Take 100 mg by mouth daily.    Historical Provider, MD  aspirin 81 MG chewable tablet Chew 1 tablet (81 mg total) by mouth daily. Patient not taking: Reported on 01/14/2015 07/03/14   Azalia Bilis, MD  carvedilol (COREG) 6.25 MG tablet Take 6.25 mg by mouth daily.     Historical Provider, MD  colchicine 0.6 MG tablet Take 2 tabs by mouth then 1 tab 6 hours later for acute attack. Start 1 tab daily 12 hours after resolution of flare. 04/14/15   Ozella Rocks, MD  digoxin (LANOXIN) 0.25 MG tablet Take 0.25 mg by mouth daily.    Historical Provider, MD  doxycycline (VIBRA-TABS) 100 MG tablet Take 1 tablet (100 mg total) by mouth 2 (two) times daily. 04/14/15   Ozella Rocks, MD  lisinopril (PRINIVIL,ZESTRIL) 20 MG tablet Take 20 mg by mouth daily.      Historical Provider, MD  Omega-3 Fatty Acids (OMEGA 3 PO) Take 1 tablet by mouth daily.    Historical Provider, MD  predniSONE (DELTASONE) 50 MG tablet Take daily with breakfast 04/14/15   Ozella Rocks, MD  Spacer/Aero-Holding [facility] Use as  directed 04/14/15   Ozella Rocks, MD  spironolactone (ALDACTONE) 25 MG tablet Take 25 mg by mouth daily.      Historical Provider, MD  torsemide (DEMADEX) 20 MG tablet Take 40 mg by mouth 2 (two) times daily.    Historical Provider, MD  vitamin B-12 (CYANOCOBALAMIN) 1000 MCG tablet Take 1,000 mcg by mouth daily.    Historical Provider, MD  warfarin (COUMADIN) 10 MG tablet Take 10 mg by mouth daily.    Historical Provider, MD   BP 118/74 mmHg  Pulse 89  Temp(Src) 97.5 F (36.4 C) (Oral)  Resp 16  SpO2 98% Physical Exam Physical Exam  Constitutional: oriented to person, place, and time. appears well-developed and well-nourished. No distress.  HENT:   Head: Normocephalic and atraumatic.  Eyes: EOMI. PERRL.  Neck: Normal range of motion.  Cardiovascular: RRR, no m/r/g, 2+ distal pulses,  Pulmonary/Chest: Intermittent wheezes with normal effort, productive cough.  Abdominal: Soft. Bowel sounds are normal. NonTTP, no distension.  Musculoskeletal: Left ankle tender to palpation without significant swelling or effusion. No induration. No fluctuance. Able to ambulate..  Neurological: alert and oriented to person, place, and time.  Skin: Skin is warm. No rash noted. non diaphoretic.  Psychiatric: normal mood and affect. behavior is normal. Judgment and thought content normal.    ED Course  Procedures (including critical care time) Labs Review Labs Reviewed - No data to display  Imaging Review No results found.   MDM   1. COPD exacerbation   2. Acute gout of left ankle, unspecified cause   3. Chronic anticoagulation    Patient without formal diagnosis of COPD but anticipate patient presenting with COPD exacerbation. He is a longtime smoker and continues to smoke and states his symptoms get worse whenever he does. Start albuterol, prednisone, and doxycycline. Of note patient chronic anticoagulation and doxycycline may affect his INR so patient will follow up with his INR clinic for more regular checks during this period of time. Patient also likely with gout flare in his ankle this will benefit from the steroids but patient also given prescription to start colchicine at this point in time. Likely etiology of this flare from changes in diet recently. Chest x-ray without acute evidence of other acute process such as CHF exacerbation or pneumonia.    Ozella Rocks, MD 04/14/15 0900  Ozella Rocks, MD 04/14/15 [phone]

## 2015-05-19 ENCOUNTER — Encounter (HOSPITAL_COMMUNITY): Payer: Self-pay | Admitting: Emergency Medicine

## 2015-05-19 ENCOUNTER — Inpatient Hospital Stay (HOSPITAL_COMMUNITY)
Admission: EM | Admit: 2015-05-19 | Discharge: 2015-05-23 | DRG: 292 | Disposition: A | Discharge status: Home / Self Care | Payer: Medicaid Other | Attending: Cardiology | Admitting: Cardiology

## 2015-05-19 ENCOUNTER — Emergency Department (HOSPITAL_COMMUNITY): Payer: Medicaid Other

## 2015-05-19 DIAGNOSIS — Z95810 Presence of automatic (implantable) cardiac defibrillator: Secondary | ICD-10-CM | POA: Diagnosis not present

## 2015-05-19 DIAGNOSIS — N179 Acute kidney failure, unspecified: Secondary | ICD-10-CM | POA: Diagnosis present

## 2015-05-19 DIAGNOSIS — I5023 Acute on chronic systolic (congestive) heart failure: Secondary | ICD-10-CM | POA: Diagnosis present

## 2015-05-19 DIAGNOSIS — M1000 Idiopathic gout, unspecified site: Secondary | ICD-10-CM | POA: Diagnosis present

## 2015-05-19 DIAGNOSIS — N183 Chronic kidney disease, stage 3 (moderate): Secondary | ICD-10-CM | POA: Diagnosis present

## 2015-05-19 DIAGNOSIS — T45511A Poisoning by anticoagulants, accidental (unintentional), initial encounter: Secondary | ICD-10-CM | POA: Diagnosis present

## 2015-05-19 DIAGNOSIS — Z79899 Other long term (current) drug therapy: Secondary | ICD-10-CM | POA: Diagnosis not present

## 2015-05-19 DIAGNOSIS — Z7982 Long term (current) use of aspirin: Secondary | ICD-10-CM

## 2015-05-19 DIAGNOSIS — I129 Hypertensive chronic kidney disease with stage 1 through stage 4 chronic kidney disease, or unspecified chronic kidney disease: Secondary | ICD-10-CM | POA: Diagnosis present

## 2015-05-19 DIAGNOSIS — F17210 Nicotine dependence, cigarettes, uncomplicated: Secondary | ICD-10-CM | POA: Diagnosis present

## 2015-05-19 DIAGNOSIS — Z7952 Long term (current) use of systemic steroids: Secondary | ICD-10-CM

## 2015-05-19 DIAGNOSIS — I252 Old myocardial infarction: Secondary | ICD-10-CM | POA: Diagnosis not present

## 2015-05-19 DIAGNOSIS — T45515A Adverse effect of anticoagulants, initial encounter: Secondary | ICD-10-CM

## 2015-05-19 DIAGNOSIS — Z9119 Patient's noncompliance with other medical treatment and regimen: Secondary | ICD-10-CM | POA: Diagnosis present

## 2015-05-19 DIAGNOSIS — I420 Dilated cardiomyopathy: Secondary | ICD-10-CM | POA: Diagnosis present

## 2015-05-19 DIAGNOSIS — Z7901 Long term (current) use of anticoagulants: Secondary | ICD-10-CM | POA: Diagnosis not present

## 2015-05-19 DIAGNOSIS — D6832 Hemorrhagic disorder due to extrinsic circulating anticoagulants: Secondary | ICD-10-CM

## 2015-05-19 LAB — COMPREHENSIVE METABOLIC PANEL
ALBUMIN: 3.5 g/dL (ref 3.5–5.0)
ALT: 107 U/L — ABNORMAL HIGH (ref 17–63)
AST: 64 U/L — AB (ref 15–41)
Alkaline Phosphatase: 109 U/L (ref 38–126)
Anion gap: 9 (ref 5–15)
BUN: 13 mg/dL (ref 6–20)
CALCIUM: 9.1 mg/dL (ref 8.9–10.3)
CO2: 31 mmol/L (ref 22–32)
CREATININE: 1.3 mg/dL — AB (ref 0.61–1.24)
Chloride: 97 mmol/L — ABNORMAL LOW (ref 101–111)
GFR calc Af Amer: >60 mL/min (ref >60)
GFR calc non Af Amer: >60 mL/min (ref >60)
Glucose, Bld: 150 mg/dL — ABNORMAL HIGH (ref 65–99)
Potassium: 3.9 mmol/L (ref 3.5–5.1)
SODIUM: 137 mmol/L (ref 135–145)
TOTAL PROTEIN: 7.2 g/dL (ref 6.5–8.1)
Total Bilirubin: 2.9 mg/dL — ABNORMAL HIGH (ref 0.3–1.2)

## 2015-05-19 LAB — CBC WITH DIFFERENTIAL/PLATELET
Basophils Absolute: 0.1 10*3/uL (ref 0.0–0.1)
Basophils Absolute: 0.1 10*3/uL (ref 0.0–0.1)
Basophils Relative: 1 % (ref 0–1)
Basophils Relative: 1 % (ref 0–1)
EOS ABS: 0.1 10*3/uL (ref 0.0–0.7)
Eosinophils Absolute: 0.1 10*3/uL (ref 0.0–0.7)
Eosinophils Relative: 1 % (ref 0–5)
Eosinophils Relative: 1 % (ref 0–5)
HCT: 47.1 % (ref 39.0–52.0)
HEMATOCRIT: 44.9 % (ref 39.0–52.0)
HEMOGLOBIN: 15 g/dL (ref 13.0–17.0)
HEMOGLOBIN: 15.7 g/dL (ref 13.0–17.0)
LYMPHS ABS: 1.8 10*3/uL (ref 0.7–4.0)
Lymphocytes Relative: 31 % (ref 12–46)
Lymphocytes Relative: 36 % (ref 12–46)
Lymphs Abs: 2.2 10*3/uL (ref 0.7–4.0)
MCH: 30.6 pg (ref 26.0–34.0)
MCH: 30.8 pg (ref 26.0–34.0)
MCHC: 33.4 g/dL (ref 30.0–36.0)
MCHC: 33.3 g/dL (ref 30.0–36.0)
MCV: 91.6 fL (ref 78.0–100.0)
MCV: 92.4 fL (ref 78.0–100.0)
MONO ABS: 0.4 10*3/uL (ref 0.1–1.0)
Monocytes Absolute: 0.4 10*3/uL (ref 0.1–1.0)
Monocytes Relative: 6 % (ref 3–12)
Monocytes Relative: 6 % (ref 3–12)
Neutro Abs: 3.6 10*3/uL (ref 1.7–7.7)
Neutro Abs: 3.4 10*3/uL (ref 1.7–7.7)
Neutrophils Relative %: 61 % (ref 43–77)
Neutrophils Relative %: 56 % (ref 43–77)
Platelets: 224 10*3/uL (ref 150–400)
Platelets: 224 10*3/uL (ref 150–400)
RBC: 4.9 MIL/uL (ref 4.22–5.81)
RBC: 5.1 MIL/uL (ref 4.22–5.81)
RDW: 16 % — AB (ref 11.5–15.5)
RDW: 16 % — AB (ref 11.5–15.5)
WBC: 5.9 10*3/uL (ref 4.0–10.5)
WBC: 6.1 10*3/uL (ref 4.0–10.5)

## 2015-05-19 LAB — BASIC METABOLIC PANEL
Anion gap: 9 (ref 5–15)
BUN: 16 mg/dL (ref 6–20)
CO2: 26 mmol/L (ref 22–32)
CREATININE: 1.4 mg/dL — AB (ref 0.61–1.24)
Calcium: 9 mg/dL (ref 8.9–10.3)
Chloride: 100 mmol/L — ABNORMAL LOW (ref 101–111)
GFR calc Af Amer: >60 mL/min (ref >60)
GFR, EST NON AFRICAN AMERICAN: 58 mL/min — AB (ref >60)
GLUCOSE: 155 mg/dL — AB (ref 65–99)
Potassium: 3.6 mmol/L (ref 3.5–5.1)
Sodium: 135 mmol/L (ref 135–145)

## 2015-05-19 LAB — GLUCOSE, CAPILLARY: Glucose-Capillary: 167 mg/dL — ABNORMAL HIGH (ref 65–99)

## 2015-05-19 LAB — I-STAT TROPONIN, ED: TROPONIN I, POC: 0.02 ng/mL (ref 0.00–0.08)

## 2015-05-19 LAB — MRSA PCR SCREENING: MRSA BY PCR: NEGATIVE

## 2015-05-19 LAB — BRAIN NATRIURETIC PEPTIDE: B NATRIURETIC PEPTIDE 5: 3083.1 pg/mL — AB (ref 0.0–100.0)

## 2015-05-19 LAB — PROTIME-INR
INR: 4.89 — AB (ref 0.00–1.49)
INR: 4.47 — ABNORMAL HIGH (ref 0.00–1.49)
Prothrombin Time: 44.2 seconds — ABNORMAL HIGH (ref 11.6–15.2)
Prothrombin Time: 41.3 seconds — ABNORMAL HIGH (ref 11.6–15.2)

## 2015-05-19 LAB — TSH: TSH: 2.467 u[IU]/mL (ref 0.350–4.500)

## 2015-05-19 LAB — DIGOXIN LEVEL: Digoxin Level: <0.2 ng/mL — ABNORMAL LOW (ref 0.8–2.0)

## 2015-05-19 MED ORDER — SODIUM CHLORIDE 0.9 % IV SOLN: 250.0000 mL | INTRAVENOUS | Status: DC | PRN

## 2015-05-19 MED ORDER — ACETAMINOPHEN 325 MG PO TABS
650.0000 mg | ORAL_TABLET | ORAL | Status: DC | PRN
  Administered 2015-05-19 – 2015-05-22 (×3): 650 mg via ORAL
  Filled 2015-05-19 (×2): qty 2

## 2015-05-19 MED ORDER — FUROSEMIDE 10 MG/ML IJ SOLN
40.0000 mg | Freq: Once | INTRAMUSCULAR | Status: AC
  Administered 2015-05-19: 40 mg via INTRAVENOUS
  Filled 2015-05-19: qty 4

## 2015-05-19 MED ORDER — POTASSIUM CHLORIDE CRYS ER 20 MEQ PO TBCR
20.0000 meq | EXTENDED_RELEASE_TABLET | Freq: Two times a day (BID) | ORAL | Status: DC
  Administered 2015-05-20 – 2015-05-23 (×7): 20 meq via ORAL
  Filled 2015-05-19 (×8): qty 1

## 2015-05-19 MED ORDER — ONDANSETRON HCL 4 MG/2ML IJ SOLN: 4.0000 mg | Freq: Four times a day (QID) | INTRAMUSCULAR | Status: DC | PRN

## 2015-05-19 MED ORDER — MILRINONE IN DEXTROSE 20 MG/100ML IV SOLN
0.2500 ug/kg/min | INTRAVENOUS | Status: DC
  Administered 2015-05-19 – 2015-05-22 (×6): 0.375 ug/kg/min via INTRAVENOUS
  Administered 2015-05-22: 0.25 ug/kg/min via INTRAVENOUS
  Administered 2015-05-22: 0.375 ug/kg/min via INTRAVENOUS
  Administered 2015-05-23: 0.25 ug/kg/min via INTRAVENOUS
  Filled 2015-05-19 (×10): qty 100

## 2015-05-19 MED ORDER — CARVEDILOL 3.125 MG PO TABS
3.1250 mg | ORAL_TABLET | Freq: Every day | ORAL | Status: DC
  Administered 2015-05-19 – 2015-05-22 (×4): 3.125 mg via ORAL
  Filled 2015-05-19 (×4): qty 1

## 2015-05-19 MED ORDER — ALLOPURINOL 100 MG PO TABS
100.0000 mg | ORAL_TABLET | Freq: Every day | ORAL | Status: DC
Start: 2015-05-19 — End: 2015-05-23
  Administered 2015-05-19 – 2015-05-23 (×5): 100 mg via ORAL
  Filled 2015-05-19 (×5): qty 1

## 2015-05-19 MED ORDER — NICOTINE 14 MG/24HR TD PT24
14.0000 mg | MEDICATED_PATCH | TRANSDERMAL | Status: DC
  Administered 2015-05-19 – 2015-05-22 (×4): 14 mg via TRANSDERMAL
  Filled 2015-05-19 (×7): qty 1

## 2015-05-19 MED ORDER — SODIUM CHLORIDE 0.9 % IJ SOLN: 3.0000 mL | INTRAMUSCULAR | Status: DC | PRN

## 2015-05-19 MED ORDER — LISINOPRIL 5 MG PO TABS
5.0000 mg | ORAL_TABLET | Freq: Every day | ORAL | Status: DC
  Administered 2015-05-19 – 2015-05-21 (×3): 5 mg via ORAL
  Filled 2015-05-19 (×3): qty 1

## 2015-05-19 MED ORDER — FUROSEMIDE 10 MG/ML IJ SOLN
40.0000 mg | Freq: Two times a day (BID) | INTRAMUSCULAR | Status: DC
  Administered 2015-05-20 – 2015-05-22 (×5): 40 mg via INTRAVENOUS
  Filled 2015-05-19 (×6): qty 4

## 2015-05-19 MED ORDER — SODIUM CHLORIDE 0.9 % IJ SOLN
3.0000 mL | Freq: Two times a day (BID) | INTRAMUSCULAR | Status: DC
  Administered 2015-05-20: 3 mL via INTRAVENOUS
  Administered 2015-05-21 – 2015-05-22 (×2): 10 mL via INTRAVENOUS
  Administered 2015-05-23: 3 mL via INTRAVENOUS

## 2015-05-19 MED ORDER — ALBUTEROL SULFATE (2.5 MG/3ML) 0.083% IN NEBU: 2.5000 mg | INHALATION_SOLUTION | Freq: Four times a day (QID) | RESPIRATORY_TRACT | Status: DC | PRN

## 2015-05-19 MED ORDER — POTASSIUM CHLORIDE CRYS ER 20 MEQ PO TBCR
40.0000 meq | EXTENDED_RELEASE_TABLET | Freq: Once | ORAL | Status: AC
  Administered 2015-05-19: 40 meq via ORAL
  Filled 2015-05-19: qty 2

## 2015-05-19 MED ORDER — ASPIRIN 81 MG PO CHEW
81.0000 mg | CHEWABLE_TABLET | Freq: Once | ORAL | Status: AC
  Administered 2015-05-19: 81 mg via ORAL
  Filled 2015-05-19: qty 1

## 2015-05-19 NOTE — Progress Notes (Signed)
MEDICATION RELATED CONSULT NOTE - INITIAL   Pharmacy Consult for milrinone Indication: CHF  No Known Allergies  Patient Measurements: Weight: 221 lb 1.9 oz (100.3 kg)  Vital Signs: Temp: 100 F (37.8 C) (06/23 1216) Temp Source: Oral (06/23 1216) BP: 123/90 mmHg (06/23 1745) Pulse Rate: 92 (06/23 1745) Intake/Output from previous day:   Intake/Output from this shift: Total I/O In: -  Out: 345 [Urine:345]  Labs:  Recent Labs  05/19/15 1223  WBC 5.9  HGB 15.0  HCT 44.9  PLT 224  CREATININE 1.40*   Estimated Creatinine Clearance: 72.8 mL/min (by C-G formula based on Cr of 1.4).   Microbiology: No results found for this or any previous visit (from the past 720 hour(s)).  Medical History: Past Medical History  Diagnosis Date  . Ventricular tachycardia   . AICD (automatic cardioverter/defibrillator) present     [employer]  . Tobacco abuse   . Alcohol abuse     hx  . Cardiomyopathy     secondary  . HTN (hypertension)     uncontrolled  . CHF (congestive heart failure)   . Gout     Medications:  Prescriptions prior to admission  Medication Sig Dispense Refill Last Dose  . albuterol (PROVENTIL HFA;VENTOLIN HFA) 108 (90 BASE) MCG/ACT inhaler Inhale 2 puffs into the lungs every 6 (six) hours as needed for wheezing or shortness of breath. 1 Inhaler 2 05/18/2015 at Unknown time  . allopurinol (ZYLOPRIM) 100 MG tablet Take 100 mg by mouth daily.   05/18/2015 at Unknown time  . carvedilol (COREG) 6.25 MG tablet Take 6.25 mg by mouth daily.    05/18/2015 at 1930  . colchicine 0.6 MG tablet Take 2 tabs by mouth then 1 tab 6 hours later for acute attack. Start 1 tab daily 12 hours after resolution of flare. 30 tablet 0 Past Month at Unknown time  . digoxin (LANOXIN) 0.25 MG tablet Take 0.25 mg by mouth daily.   05/18/2015 at Unknown time  . lisinopril (PRINIVIL,ZESTRIL) 20 MG tablet Take 20 mg by mouth daily.     05/19/2015 at Unknown time  . Omega-3 Fatty  Acids (OMEGA 3 PO) Take 1 tablet by mouth daily.   05/18/2015 at Unknown time  . predniSONE (DELTASONE) 50 MG tablet Take daily with breakfast 5 tablet 0 Past Week at Unknown time  . Spacer/Aero-Holding [employer] Use as directed 1 each 1 05/18/2015 at Unknown time  . spironolactone (ALDACTONE) 25 MG tablet Take 25 mg by mouth daily.     05/19/2015 at Unknown time  . torsemide (DEMADEX) 20 MG tablet Take 40 mg by mouth 2 (two) times daily.   05/19/2015 at Unknown time  . vitamin B-12 (CYANOCOBALAMIN) 1000 MCG tablet Take 1,000 mcg by mouth daily.   Past Month at Unknown time  . warfarin (COUMADIN) 10 MG tablet Take 10 mg by mouth daily.   05/19/2015 at Unknown time  . aspirin 81 MG chewable tablet Chew 1 tablet (81 mg total) by mouth daily. (Patient not taking: Reported on 01/14/2015) 30 tablet 3 Not Taking at Unknown time  . doxycycline (VIBRA-TABS) 100 MG tablet Take 1 tablet (100 mg total) by mouth 2 (two) times daily. (Patient not taking: Reported on 05/19/2015) 20 tablet 0 Completed Course at Unknown time    Assessment: 49 yo man start milrinone for acute on chronic decompensated systolic heart failure/cardiorenal syndrome.   Goal of Therapy:  Increase heart contractility  Plan:  Start milrinone drip at 0.375  mcg/kg/min.   Further titration per MD  Talbert Cage Poteet 05/19/2015,6:49 PM

## 2015-05-19 NOTE — Progress Notes (Signed)
Patient having increased ectopy and runs of NSVT. Dr. Sharyn Lull made aware, new orders given. Will continue to monitor.

## 2015-05-19 NOTE — H&P (Signed)
Parker Johnson is an 49 y.o. male.   Chief Complaint: Progressive increasing shortness of breath associated with leg swelling HPI: Patient is 49 year old male with past medical history significant for multiple medical problems i.e. 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 Patient denies any noncompliance to medication but states has been eating outside food lately. Denies any cough fever chills. Denies any anginal chest pain nausea vomiting diaphoresis also complains of musculoskeletal right shoulder pain. Denies any ICD discharges. Patient was noted to have elevated BNP above 3000.  Past Medical History  Diagnosis Date  . Ventricular tachycardia   . AICD (automatic cardioverter/defibrillator) present     [employer] 100  . Tobacco abuse   . Alcohol abuse     hx  . Cardiomyopathy     secondary  . HTN (hypertension)     uncontrolled  . CHF (congestive heart failure)   . Gout     Past Surgical History  Procedure Laterality Date  . Cardiac defibrillator placement  2010    [employer] 100  . Embolectomy Right 06/04/2013    Procedure: Upper Extremity Thrombectomy;  Surgeon: Serafina Mitchell, MD;  Location: [facility] OR;  Service: Vascular;  Laterality: Right;    Family History   Problem Relation Age of Onset  . Cardiomyopathy Father     on a defib  . Diabetes Father    Social History:  reports that he has been smoking Cigarettes.  He has a 15 pack-year smoking history. He has never used smokeless tobacco. He reports that he drinks about 0.6 oz of alcohol per week. He reports that he does not use illicit drugs.  Allergies: No Known Allergies   (Not in a hospital admission)  Results for orders placed or performed during the hospital encounter of 05/19/15 (from the past 48 hour(s))  Basic metabolic panel     Status: Abnormal   Collection Time: 05/19/15 12:23 PM  Result Value Ref Range   Sodium 135 135 - 145 mmol/L   Potassium 3.6 3.5 - 5.1 mmol/L   Chloride 100 (L) 101 - 111 mmol/L   CO2 26 22 - 32 mmol/L   Glucose, Bld 155 (H) 65 - 99 mg/dL   BUN 16 6 - 20 mg/dL   Creatinine, Ser 1.40 (H) 0.61 - 1.24 mg/dL   Calcium 9.0 8.9 - 10.3 mg/dL   GFR calc non Af Amer 58 (L) >60 mL/min   GFR calc Af Amer >60 >60 mL/min    Comment: (NOTE) The eGFR has been calculated using the CKD EPI equation. This calculation has not been validated in all clinical situations. eGFR's persistently <60 mL/min signify possible Chronic Kidney Disease.    Anion gap 9 5 - 15  BNP (order ONLY if patient complains of dyspnea/SOB AND you have documented it for THIS visit)     Status: Abnormal  Collection Time: 05/19/15 12:23 PM  Result Value Ref Range   B Natriuretic Peptide 3083.1 (H) 0.0 - 100.0 pg/mL  Protime-INR (if pt is taking Coumadin)     Status: Abnormal   Collection Time: 05/19/15 12:23 PM  Result Value Ref Range   Prothrombin Time 44.2 (H) 11.6 - 15.2 seconds   INR 4.89 (H) 0.00 - 1.49  CBC with Differential     Status: Abnormal   Collection Time: 05/19/15 12:23 PM  Result Value Ref Range   WBC 5.9 4.0 - 10.5 K/uL   RBC 4.90 4.22 - 5.81 MIL/uL   Hemoglobin 15.0 13.0 - 17.0 g/dL   HCT 44.9 39.0 - 52.0 %   MCV 91.6 78.0 - 100.0 fL   MCH 30.6 26.0 - 34.0 pg   MCHC  33.4 30.0 - 36.0 g/dL   RDW 16.0 (H) 11.5 - 15.5 %   Platelets 224 150 - 400 K/uL   Neutrophils Relative % 61 43 - 77 %   Neutro Abs 3.6 1.7 - 7.7 K/uL   Lymphocytes Relative 31 12 - 46 %   Lymphs Abs 1.8 0.7 - 4.0 K/uL   Monocytes Relative 6 3 - 12 %   Monocytes Absolute 0.4 0.1 - 1.0 K/uL   Eosinophils Relative 1 0 - 5 %   Eosinophils Absolute 0.1 0.0 - 0.7 K/uL   Basophils Relative 1 0 - 1 %   Basophils Absolute 0.1 0.0 - 0.1 K/uL  I-stat troponin, ED  (not at [facility])     Status: None   Collection Time: 05/19/15 12:31 PM  Result Value Ref Range   Troponin i, poc 0.02 0.00 - 0.08 ng/mL   Comment 3            Comment: Due to the release kinetics of cTnI, a negative result within the first hours of the onset of symptoms does not rule out myocardial infarction with certainty. If myocardial infarction is still suspected, repeat the test at appropriate intervals.    Dg Chest 2 View  05/19/2015   CLINICAL DATA:  Shortness of breath and right lower extremity edema. Right shoulder pain.  EXAM: CHEST  2 VIEW  COMPARISON:  04/14/2015  FINDINGS: The heart is enlarged but stable. The pacer wires are stable. Mild chronic vascular congestion and bibasilar scarring changes. No overt pulmonary edema or pleural effusions.  IMPRESSION: Stable cardiac enlargement and chronic vascular congestion. No overt pulmonary edema or pleural effusions.   Electronically Signed   By: Marijo Sanes M.D.   On: 05/19/2015 13:03    Review of Systems  Constitutional: Negative for fever and chills.  Eyes: Negative for double vision.  Respiratory: Positive for shortness of breath. Negative for hemoptysis.   Cardiovascular: Positive for leg swelling and PND. Negative for chest pain and palpitations.  Gastrointestinal: Negative for nausea and vomiting.  Genitourinary: Negative for dysuria.  Neurological: Negative for dizziness and headaches.    Blood pressure 126/98, pulse 89, temperature 100 F (37.8 C),  temperature source Oral, resp. rate 20, weight 101.833 kg (224 lb 8 oz), SpO2 98 %. Physical Exam  Constitutional: He is oriented to person, place, and time.  HENT:  Head: Normocephalic and atraumatic.  Eyes: Conjunctivae are normal. Left eye exhibits no discharge. No scleral icterus.  Neck: Normal range of motion. Neck supple. JVD present. No tracheal deviation present. No thyromegaly present.  Cardiovascular: Normal rate and regular rhythm.   Murmur (Soft systolic murmur and S3 gallop noted) heard. Respiratory:  Decreased breath sound at bases with faint rales noted  GI: Soft. Bowel sounds are normal. He exhibits no distension. There is no tenderness.  Musculoskeletal:  No clubbing cyanosis 2+ edema noted.  Neurological: He is alert and oriented to person, place, and time.     Assessment/Plan Acute on chronic decompensated systolic heart failure Severe nonischemic dilated myopathy Coumadin toxicity Hypertension History of paroxysmal nonsustained VT and A. fib in the past History of cardiogenic non-Q-wave MI in the past History of cardio emboli to the right arm status post thrombectomy in July 2014 History of polysubstance/EtOH/tobacco abuse in the past History of gouty arthritis Chronic kidney disease stage III Plan As per orders  Charolette Forward 05/19/2015, 5:00 PM

## 2015-05-19 NOTE — ED Notes (Signed)
Checked patient blood sugar it was 114 notified RN of blood sugar

## 2015-05-19 NOTE — ED Provider Notes (Signed)
CSN: [id number]     Arrival date & time 05/19/15  1203 History   First MD Initiated Contact with Patient 05/19/15 1556     Chief Complaint  Patient presents with  . Shortness of Breath  . Leg Swelling     (Consider location/radiation/quality/duration/timing/severity/associated sxs/prior Treatment) Patient is a 49 y.o. male presenting with shortness of breath. The history is provided by the patient.  Shortness of Breath He complains of exertional dyspnea over the last week, getting worse. Dyspnea is worse if he walks outside in the heat. There has been paroxysmal nocturnal dyspnea. He has also had some aching in his right shoulder, and tingling in his left arm. There has been no exertional chest pain. Over the last week, he has noticed increasing leg swelling, which he tried to treat by taking furosemide and elevating his legs, but with no relief. He has not been weighing himself at home. He says he has no  taken any of his medications today.  Past Medical History  Diagnosis Date  . Ventricular tachycardia   . AICD (automatic cardioverter/defibrillator) present     [employer] 100  . Tobacco abuse   . Alcohol abuse     hx  . Cardiomyopathy     secondary  . HTN (hypertension)     uncontrolled  . CHF (congestive heart failure)   . Gout    Past Surgical History  Procedure Laterality Date  . Cardiac defibrillator placement  2010    [employer] 100  . Embolectomy Right 06/04/2013    Procedure: Upper Extremity Thrombectomy;  Surgeon: Nada Libman, MD;  Location: [facility];  Service: Vascular;  Laterality: Right;   Family History  Problem Relation Age of Onset  . Cardiomyopathy Father     on a defib  . Diabetes Father    History  Substance Use Topics  . Smoking status: Current Every Day Smoker -- 0.50 packs/day for 30 years    Types: Cigarettes  . Smokeless tobacco: Never Used     Comment: I already have information on quitting"  . Alcohol Use: 0.6  oz/week    1 Cans of beer per week    Review of Systems  Respiratory: Positive for shortness of breath.   All other systems reviewed and are negative.     Allergies  Review of patient's allergies indicates no known allergies.  Home Medications   Prior to Admission medications   Medication Sig Start Date End Date Taking? Authorizing Provider  albuterol (PROVENTIL HFA;VENTOLIN HFA) 108 (90 BASE) MCG/ACT inhaler Inhale 2 puffs into the lungs every 6 (six) hours as needed for wheezing or shortness of breath. 04/14/15   Ozella Rocks, MD  allopurinol (ZYLOPRIM) 100 MG tablet Take 100 mg by mouth daily.    Historical Provider, MD  aspirin 81 MG chewable tablet Chew 1 tablet (81 mg total) by mouth daily. Patient not taking: Reported on 01/14/2015 07/03/14   Azalia Bilis, MD  carvedilol (COREG) 6.25 MG tablet Take 6.25 mg by mouth daily.     Historical Provider, MD  colchicine 0.6 MG tablet Take 2 tabs by mouth then 1 tab 6 hours later for acute attack. Start 1 tab daily 12 hours after resolution of flare. 04/14/15   Ozella Rocks, MD  digoxin (LANOXIN) 0.25 MG tablet Take 0.25 mg by mouth daily.    Historical Provider, MD  doxycycline (VIBRA-TABS) 100 MG tablet Take 1 tablet (100 mg total) by mouth 2 (two) times daily.  04/14/15   Ozella Rocks, MD  lisinopril (PRINIVIL,ZESTRIL) 20 MG tablet Take 20 mg by mouth daily.      Historical Provider, MD  Omega-3 Fatty Acids (OMEGA 3 PO) Take 1 tablet by mouth daily.    Historical Provider, MD  predniSONE (DELTASONE) 50 MG tablet Take daily with breakfast 04/14/15   Ozella Rocks, MD  Spacer/Aero-Holding Deretha [facility] DEVI Use as directed 04/14/15   Ozella Rocks, MD  spironolactone (ALDACTONE) 25 MG tablet Take 25 mg by mouth daily.      Historical Provider, MD  torsemide (DEMADEX) 20 MG tablet Take 40 mg by mouth 2 (two) times daily.    Historical Provider, MD  vitamin B-12 (CYANOCOBALAMIN) 1000 MCG tablet Take 1,000 mcg by mouth daily.    Historical  Provider, MD  warfarin (COUMADIN) 10 MG tablet Take 10 mg by mouth daily.    Historical Provider, MD   BP 110/66 mmHg  Pulse 89  Temp(Src) 100 F (37.8 C) (Oral)  Resp 20  Wt 224 lb 8 oz (101.833 kg)  SpO2 100% Physical Exam  Nursing note and vitals reviewed.  49 year old male, resting comfortably and in no acute distress. Vital signs are normal. Oxygen saturation is 100%, which is normal. Head is normocephalic and atraumatic. PERRLA, EOMI. Oropharynx is clear. Neck is nontender and supple without adenopathy or JVD. Back is nontender and there is no CVA tenderness. Lungs are clear without rales, wheezes, or rhonchi. Chest is nontender. Heart has regular rate and rhythm without murmur. Abdomen is soft, flat, nontender without masses or hepatosplenomegaly and peristalsis is normoactive. Extremities have 2+ edema, full range of motion is present. Peripheral pulses are strong, capillary refill is prompt. Skin is warm and dry without rash. Neurologic: Mental status is normal, cranial nerves are intact, there are no motor or sensory deficits.  ED Course  Procedures (including critical care time) Labs Review Results for orders placed or performed during the hospital encounter of 05/19/15  Basic metabolic panel  Result Value Ref Range   Sodium 135 135 - 145 mmol/L   Potassium 3.6 3.5 - 5.1 mmol/L   Chloride 100 (L) 101 - 111 mmol/L   CO2 26 22 - 32 mmol/L   Glucose, Bld 155 (H) 65 - 99 mg/dL   BUN 16 6 - 20 mg/dL   Creatinine, Ser 0.74 (H) 0.61 - 1.24 mg/dL   Calcium 9.0 8.9 - 60.0 mg/dL   GFR calc non Af Amer 58 (L) >60 mL/min   GFR calc Af Amer >60 >60 mL/min   Anion gap 9 5 - 15  BNP (order ONLY if patient complains of dyspnea/SOB AND you have documented it for THIS visit)  Result Value Ref Range   B Natriuretic Peptide 3083.1 (H) 0.0 - 100.0 pg/mL  Protime-INR (if pt is taking Coumadin)  Result Value Ref Range   Prothrombin Time 44.2 (H) 11.6 - 15.2 seconds   INR 4.89 (H)  0.00 - 1.49  CBC with Differential  Result Value Ref Range   WBC 5.9 4.0 - 10.5 K/uL   RBC 4.90 4.22 - 5.81 MIL/uL   Hemoglobin 15.0 13.0 - 17.0 g/dL   HCT 29.8 47.3 - 08.5 %   MCV 91.6 78.0 - 100.0 fL   MCH 30.6 26.0 - 34.0 pg   MCHC 33.4 30.0 - 36.0 g/dL   RDW 69.4 (H) 37.0 - 05.2 %   Platelets 224 150 - 400 K/uL   Neutrophils Relative % 61 43 -  77 %   Neutro Abs 3.6 1.7 - 7.7 K/uL   Lymphocytes Relative 31 12 - 46 %   Lymphs Abs 1.8 0.7 - 4.0 K/uL   Monocytes Relative 6 3 - 12 %   Monocytes Absolute 0.4 0.1 - 1.0 K/uL   Eosinophils Relative 1 0 - 5 %   Eosinophils Absolute 0.1 0.0 - 0.7 K/uL   Basophils Relative 1 0 - 1 %   Basophils Absolute 0.1 0.0 - 0.1 K/uL  I-stat troponin, ED  (not at [facility], [facility])  Result Value Ref Range   Troponin i, poc 0.02 0.00 - 0.08 ng/mL   Comment 3           Imaging Review Dg Chest 2 View  05/19/2015   CLINICAL DATA:  Shortness of breath and right lower extremity edema. Right shoulder pain.  EXAM: CHEST  2 VIEW  COMPARISON:  04/14/2015  FINDINGS: The heart is enlarged but stable. The pacer wires are stable. Mild chronic vascular congestion and bibasilar scarring changes. No overt pulmonary edema or pleural effusions.  IMPRESSION: Stable cardiac enlargement and chronic vascular congestion. No overt pulmonary edema or pleural effusions.   Electronically Signed   By: Rudie Meyer M.D.   On: 05/19/2015 13:03     EKG Interpretation   Date/Time:  Thursday May 19 2015 12:14:50 EDT Ventricular Rate:  105 PR Interval:  154 QRS Duration: 98 QT Interval:  356 QTC Calculation: 470 R Axis:   -85 Text Interpretation:  Sinus tachycardia Biatrial enlargement Left axis  deviation Pulmonary disease pattern Abnormal ECG When compared with ECG of  03/13/2015, No significant change was found Confirmed by [facility]  MD, Saga Balthazar  ([id number]) on 05/19/2015 3:59:17 PM      MDM   Final diagnoses:  Acute on chronic systolic heart failure  Acute kidney injury  (nontraumatic)  Warfarin-induced coagulopathy    Exacerbation of CHF. He has not been compliant with daily weights and titrating furosemide. Creatinine has increased over baseline. INR is supratherapeutic, but no sign of bleeding. Old records were reviewed, and he had been admitted in April with a similar presentation. BNP is higher today than at that admission. He is given a dose of furosemide. Case is discussed with Dr. Sharyn Lull who agrees to see the patient in the ED and admit him.    Dione Booze, MD 05/19/15 [phone]

## 2015-05-19 NOTE — ED Notes (Signed)
Pt c/o SOB and leg swelling; pt sts some pain in right shoulder and arm x 3 days getting more severe

## 2015-05-20 LAB — BASIC METABOLIC PANEL
ANION GAP: 12 (ref 5–15)
BUN: 16 mg/dL (ref 6–20)
CALCIUM: 9 mg/dL (ref 8.9–10.3)
CO2: 32 mmol/L (ref 22–32)
Chloride: 97 mmol/L — ABNORMAL LOW (ref 101–111)
Creatinine, Ser: 1.41 mg/dL — ABNORMAL HIGH (ref 0.61–1.24)
GFR calc Af Amer: >60 mL/min (ref >60)
GFR calc non Af Amer: 58 mL/min — ABNORMAL LOW (ref >60)
GLUCOSE: 130 mg/dL — AB (ref 65–99)
Potassium: 3.6 mmol/L (ref 3.5–5.1)
SODIUM: 141 mmol/L (ref 135–145)

## 2015-05-20 LAB — CBC
HCT: 41.4 % (ref 39.0–52.0)
HEMOGLOBIN: 14.1 g/dL (ref 13.0–17.0)
MCH: 31.2 pg (ref 26.0–34.0)
MCHC: 34.1 g/dL (ref 30.0–36.0)
MCV: 91.6 fL (ref 78.0–100.0)
PLATELETS: 203 10*3/uL (ref 150–400)
RBC: 4.52 MIL/uL (ref 4.22–5.81)
RDW: 15.9 % — ABNORMAL HIGH (ref 11.5–15.5)
WBC: 5.5 10*3/uL (ref 4.0–10.5)

## 2015-05-20 LAB — PROTIME-INR
INR: 4.15 — AB (ref 0.00–1.49)
PROTHROMBIN TIME: 39.1 s — AB (ref 11.6–15.2)

## 2015-05-20 LAB — MAGNESIUM: Magnesium: 1.9 mg/dL (ref 1.7–2.4)

## 2015-05-20 NOTE — Progress Notes (Signed)
Subjective:  Denies any chest pain.  States breathing and leg swelling has improved.  Also states was recently seen at urgent care was prescribed steroids and antibiotics for possible bronchitis.  Objective:  Vital Signs in the last 24 hours: Temp:  [97.9 F (36.6 C)-100 F (37.8 C)] 97.9 F (36.6 C) (06/24 0804) Pulse Rate:  [89-100] 89 (06/24 0804) Resp:  [15-32] 16 (06/24 0700) BP: (89-151)/(58-98) 95/60 mmHg (06/24 0700) SpO2:  [95 %-100 %] 98 % (06/24 0700) Weight:  [100.3 kg (221 lb 1.9 oz)-101.833 kg (224 lb 8 oz)] 100.9 kg (222 lb 7.1 oz) (06/24 0500)  Intake/Output from previous day: 06/23 0701 - 06/24 0700 In: 489.4 [P.O.:360; I.V.:129.4] Out: 1045 [Urine:1045] Intake/Output from this shift:    Physical Exam: Neck: no adenopathy, no carotid bruit and supple, symmetrical, trachea midline Lungs: decreased breath sounds in bases Heart: regular rate and rhythm, S1, S2 normal and soft systolic murmur and S3 gallop noted Abdomen: soft, non-tender; bowel sounds normal; no masses,  no organomegaly Extremities: no clubbing, cyanosis, 1+ edema  Lab Results:  Recent Labs  05/19/15 1950 05/20/15 0500  WBC 6.1 5.5  HGB 15.7 14.1  PLT 224 203    Recent Labs  05/19/15 1950 05/20/15 0500  NA 137 141  K 3.9 3.6  CL 97* 97*  CO2 31 32  GLUCOSE 150* 130*  BUN 13 16  CREATININE 1.30* 1.41*   No results for input(s): TROPONINI in the last 72 hours.  Invalid input(s): CK, MB Hepatic Function Panel  Recent Labs  05/19/15 1950  PROT 7.2  ALBUMIN 3.5  AST 64*  ALT 107*  ALKPHOS 109  BILITOT 2.9*   No results for input(s): CHOL in the last 72 hours. No results for input(s): PROTIME in the last 72 hours.  Imaging: Imaging results have been reviewed and Dg Chest 2 View  05/19/2015   CLINICAL DATA:  Shortness of breath and right lower extremity edema. Right shoulder pain.  EXAM: CHEST  2 VIEW  COMPARISON:  04/14/2015  FINDINGS: The heart is enlarged but stable.  The pacer wires are stable. Mild chronic vascular congestion and bibasilar scarring changes. No overt pulmonary edema or pleural effusions.  IMPRESSION: Stable cardiac enlargement and chronic vascular congestion. No overt pulmonary edema or pleural effusions.   Electronically Signed   By: Rudie Meyer M.D.   On: 05/19/2015 13:03    Cardiac Studies:  Assessment/Plan:  Acute on chronic decompensated systolic heart failure Severe nonischemic dilated myopathy Coumadin toxicity Hypertension History of paroxysmal nonsustained VT and A. 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 Check labs in a.m. Will restart Coumadin once INR around 2  LOS: 1 day    Rinaldo Cloud 05/20/2015, 8:58 AM

## 2015-05-21 LAB — PROTIME-INR
INR: 3.09 — ABNORMAL HIGH (ref 0.00–1.49)
Prothrombin Time: 31.3 seconds — ABNORMAL HIGH (ref 11.6–15.2)

## 2015-05-21 LAB — BRAIN NATRIURETIC PEPTIDE: B Natriuretic Peptide: 1073 pg/mL — ABNORMAL HIGH (ref 0.0–100.0)

## 2015-05-21 LAB — BASIC METABOLIC PANEL
Anion gap: 9 (ref 5–15)
BUN: 14 mg/dL (ref 6–20)
CALCIUM: 8.8 mg/dL — AB (ref 8.9–10.3)
CO2: 30 mmol/L (ref 22–32)
Chloride: 99 mmol/L — ABNORMAL LOW (ref 101–111)
Creatinine, Ser: 1.28 mg/dL — ABNORMAL HIGH (ref 0.61–1.24)
GFR calc Af Amer: >60 mL/min (ref >60)
GFR calc non Af Amer: >60 mL/min (ref >60)
Glucose, Bld: 143 mg/dL — ABNORMAL HIGH (ref 65–99)
Potassium: 3.7 mmol/L (ref 3.5–5.1)
Sodium: 138 mmol/L (ref 135–145)

## 2015-05-21 LAB — CBC
HEMATOCRIT: 41.4 % (ref 39.0–52.0)
HEMOGLOBIN: 14 g/dL (ref 13.0–17.0)
MCH: 30.7 pg (ref 26.0–34.0)
MCHC: 33.8 g/dL (ref 30.0–36.0)
MCV: 90.8 fL (ref 78.0–100.0)
PLATELETS: 207 10*3/uL (ref 150–400)
RBC: 4.56 MIL/uL (ref 4.22–5.81)
RDW: 15.8 % — ABNORMAL HIGH (ref 11.5–15.5)
WBC: 6.2 10*3/uL (ref 4.0–10.5)

## 2015-05-21 MED ORDER — LISINOPRIL 10 MG PO TABS
10.0000 mg | ORAL_TABLET | Freq: Every day | ORAL | Status: DC
  Administered 2015-05-22 – 2015-05-23 (×2): 10 mg via ORAL
  Filled 2015-05-21 (×2): qty 1

## 2015-05-21 MED ORDER — WARFARIN - PHYSICIAN DOSING INPATIENT
Freq: Every day | Status: DC
  Administered 2015-05-21 – 2015-05-22 (×2)

## 2015-05-21 MED ORDER — WARFARIN SODIUM 7.5 MG PO TABS
7.5000 mg | ORAL_TABLET | Freq: Every day | ORAL | Status: DC
  Administered 2015-05-21: 7.5 mg via ORAL
  Filled 2015-05-21 (×2): qty 1

## 2015-05-21 NOTE — Progress Notes (Signed)
Patient has been up and ambulating in room intermittently through the day, he has been alert and oriented, visitors at bedside, taken all medications without difficulty, he has had no complaint of pain or discomfort, no signs of distress, will continue to monitor

## 2015-05-21 NOTE — Progress Notes (Signed)
Subjective:  Patient denies any chest pain states breathing and leg swelling is slowly improving. Tolerating inotropic soccer  Objective:  Vital Signs in the last 24 hours: Temp:  [97.3 F (36.3 C)-98.3 F (36.8 C)] 97.4 F (36.3 C) (06/25 0810) Pulse Rate:  [81-84] 84 (06/24 1627) Resp:  [16-20] 20 (06/25 0300) BP: (106-145)/(60-124) 124/88 mmHg (06/25 0810) SpO2:  [94 %-99 %] 96 % (06/25 0810) Weight:  [100.608 kg (221 lb 12.8 oz)] 100.608 kg (221 lb 12.8 oz) (06/25 0300)  Intake/Output from previous day: 06/24 0701 - 06/25 0700 In: 1591.2 [P.O.:1320; I.V.:271.2] Out: 2550 [Urine:2550] Intake/Output from this shift: Total I/O In: 213.9 [P.O.:180; I.V.:33.9] Out: 350 [Urine:350]  Physical Exam: Neck: no adenopathy, no carotid bruit, no JVD and supple, symmetrical, trachea midline Lungs: Decrease breath sounds at bases with faint rales noted Heart: regular rate and rhythm, S1, S2 normal and Soft systolic murmur and S3 gallop noted Abdomen: soft, non-tender; bowel sounds normal; no masses,  no organomegaly Extremities: extremities normal, atraumatic, no cyanosis or edema  Lab Results:  Recent Labs  05/20/15 0500 05/21/15 0240  WBC 5.5 6.2  HGB 14.1 14.0  PLT 203 207    Recent Labs  05/20/15 0500 05/21/15 0240  NA 141 138  K 3.6 3.7  CL 97* 99*  CO2 32 30  GLUCOSE 130* 143*  BUN 16 14  CREATININE 1.41* 1.28*   No results for input(s): TROPONINI in the last 72 hours.  Invalid input(s): CK, MB Hepatic Function Panel  Recent Labs  05/19/15 1950  PROT 7.2  ALBUMIN 3.5  AST 64*  ALT 107*  ALKPHOS 109  BILITOT 2.9*   No results for input(s): CHOL in the last 72 hours. No results for input(s): PROTIME in the last 72 hours.  Imaging: Imaging results have been reviewed and Dg Chest 2 View  05/19/2015   CLINICAL DATA:  Shortness of breath and right lower extremity edema. Right shoulder pain.  EXAM: CHEST  2 VIEW  COMPARISON:  04/14/2015  FINDINGS: The  heart is enlarged but stable. The pacer wires are stable. Mild chronic vascular congestion and bibasilar scarring changes. No overt pulmonary edema or pleural effusions.  IMPRESSION: Stable cardiac enlargement and chronic vascular congestion. No overt pulmonary edema or pleural effusions.   Electronically Signed   By: Rudie Meyer M.D.   On: 05/19/2015 13:03    Cardiac Studies:  Assessment/Plan:  Resolving Acute on chronic decompensated systolic heart failure Severe nonischemic dilated myopathy Coumadin toxicity Hypertension History of paroxysmal nonsustained VT and A. 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

## 2015-05-22 LAB — BASIC METABOLIC PANEL
ANION GAP: 7 (ref 5–15)
BUN: 14 mg/dL (ref 6–20)
CO2: 30 mmol/L (ref 22–32)
Calcium: 8.5 mg/dL — ABNORMAL LOW (ref 8.9–10.3)
Chloride: 97 mmol/L — ABNORMAL LOW (ref 101–111)
Creatinine, Ser: 1.14 mg/dL (ref 0.61–1.24)
GFR calc Af Amer: >60 mL/min (ref >60)
GFR calc non Af Amer: >60 mL/min (ref >60)
Glucose, Bld: 156 mg/dL — ABNORMAL HIGH (ref 65–99)
POTASSIUM: 4.1 mmol/L (ref 3.5–5.1)
SODIUM: 134 mmol/L — AB (ref 135–145)

## 2015-05-22 LAB — CBC
HCT: 43.1 % (ref 39.0–52.0)
Hemoglobin: 14.3 g/dL (ref 13.0–17.0)
MCH: 30.2 pg (ref 26.0–34.0)
MCHC: 33.2 g/dL (ref 30.0–36.0)
MCV: 91.1 fL (ref 78.0–100.0)
Platelets: 226 10*3/uL (ref 150–400)
RBC: 4.73 MIL/uL (ref 4.22–5.81)
RDW: 15.9 % — ABNORMAL HIGH (ref 11.5–15.5)
WBC: 5.9 10*3/uL (ref 4.0–10.5)

## 2015-05-22 LAB — BRAIN NATRIURETIC PEPTIDE: B Natriuretic Peptide: 1000.5 pg/mL — ABNORMAL HIGH (ref 0.0–100.0)

## 2015-05-22 LAB — PROTIME-INR
INR: 2.11 — ABNORMAL HIGH (ref 0.00–1.49)
Prothrombin Time: 23.5 seconds — ABNORMAL HIGH (ref 11.6–15.2)

## 2015-05-22 MED ORDER — CARVEDILOL 6.25 MG PO TABS
6.2500 mg | ORAL_TABLET | Freq: Every day | ORAL | Status: DC
  Administered 2015-05-22 – 2015-05-23 (×2): 6.25 mg via ORAL
  Filled 2015-05-22 (×3): qty 1

## 2015-05-22 MED ORDER — DIGOXIN 250 MCG PO TABS
0.2500 mg | ORAL_TABLET | Freq: Every day | ORAL | Status: DC
  Administered 2015-05-22 – 2015-05-23 (×2): 0.25 mg via ORAL
  Filled 2015-05-22 (×2): qty 1

## 2015-05-22 MED ORDER — TORSEMIDE 20 MG PO TABS
40.0000 mg | ORAL_TABLET | Freq: Two times a day (BID) | ORAL | Status: DC
Start: 2015-05-22 — End: 2015-05-23
  Administered 2015-05-22 – 2015-05-23 (×2): 40 mg via ORAL
  Filled 2015-05-22 (×5): qty 2

## 2015-05-22 MED ORDER — WARFARIN SODIUM 10 MG PO TABS
10.0000 mg | ORAL_TABLET | Freq: Every day | ORAL | Status: DC
  Administered 2015-05-22: 10 mg via ORAL
  Filled 2015-05-22 (×2): qty 1

## 2015-05-22 NOTE — Progress Notes (Signed)
Subjective:  Doing well denies any chest pain or shortness of breath. Diuresing well  Objective:  Vital Signs in the last 24 hours: Temp:  [97.5 F (36.4 C)-98.3 F (36.8 C)] 97.7 F (36.5 C) (06/26 0811) Pulse Rate:  [84-94] 94 (06/26 0811) Resp:  [18] 18 (06/26 0409) BP: (97-138)/(56-84) 138/78 mmHg (06/26 0811) SpO2:  [95 %-100 %] 98 % (06/26 0811) Weight:  [99.927 kg (220 lb 4.8 oz)] 99.927 kg (220 lb 4.8 oz) (06/26 0409)  Intake/Output from previous day: 06/25 0701 - 06/26 0700 In: 537.3 [P.O.:300; I.V.:237.3] Out: 2450 [Urine:2450] Intake/Output from this shift: Total I/O In: 285.2 [P.O.:240; I.V.:45.2] Out: 625 [Urine:625]  Physical Exam: Neck: no adenopathy, no carotid bruit, no JVD and supple, symmetrical, trachea midline Lungs: Decreased breath sound at bases with faint rales Heart: regular rate and rhythm, S1, S2 normal and Soft systolic murmur and S3 gallop noted Abdomen: soft, non-tender; bowel sounds normal; no masses,  no organomegaly Extremities: extremities normal, atraumatic, no cyanosis or edema  Lab Results:  Recent Labs  05/21/15 0240 05/22/15 0215  WBC 6.2 5.9  HGB 14.0 14.3  PLT 207 226    Recent Labs  05/21/15 0240 05/22/15 0215  NA 138 134*  K 3.7 4.1  CL 99* 97*  CO2 30 30  GLUCOSE 143* 156*  BUN 14 14  CREATININE 1.28* 1.14   No results for input(s): TROPONINI in the last 72 hours.  Invalid input(s): CK, MB Hepatic Function Panel  Recent Labs  05/19/15 1950  PROT 7.2  ALBUMIN 3.5  AST 64*  ALT 107*  ALKPHOS 109  BILITOT 2.9*   No results for input(s): CHOL in the last 72 hours. No results for input(s): PROTIME in the last 72 hours.  Imaging: Imaging results have been reviewed and No results found.  Cardiac Studies:  Assessment/Plan:  Resolving Acute on chronic decompensated systolic heart failure Severe nonischemic dilated myopathy Coumadin toxicity Hypertension History of paroxysmal nonsustained VT and A.  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

## 2015-05-23 ENCOUNTER — Encounter: Payer: Medicaid Other | Admitting: *Deleted

## 2015-05-23 ENCOUNTER — Telehealth: Payer: Self-pay | Admitting: Cardiology

## 2015-05-23 LAB — PROTIME-INR
INR: 1.64 — AB (ref 0.00–1.49)
Prothrombin Time: 19.4 seconds — ABNORMAL HIGH (ref 11.6–15.2)

## 2015-05-23 LAB — BASIC METABOLIC PANEL
Anion gap: 7 (ref 5–15)
BUN: 14 mg/dL (ref 6–20)
CALCIUM: 8.6 mg/dL — AB (ref 8.9–10.3)
CHLORIDE: 99 mmol/L — AB (ref 101–111)
CO2: 30 mmol/L (ref 22–32)
CREATININE: 1.2 mg/dL (ref 0.61–1.24)
GFR calc non Af Amer: >60 mL/min (ref >60)
Glucose, Bld: 170 mg/dL — ABNORMAL HIGH (ref 65–99)
Potassium: 4.4 mmol/L (ref 3.5–5.1)
Sodium: 136 mmol/L (ref 135–145)

## 2015-05-23 LAB — BRAIN NATRIURETIC PEPTIDE: B Natriuretic Peptide: 1084.4 pg/mL — ABNORMAL HIGH (ref 0.0–100.0)

## 2015-05-23 MED ORDER — NICOTINE 14 MG/24HR TD PT24: 14.0000 mg | MEDICATED_PATCH | TRANSDERMAL | Status: DC

## 2015-05-23 MED ORDER — MILRINONE IN DEXTROSE 20 MG/100ML IV SOLN: 0.1250 ug/kg/min | INTRAVENOUS | Status: DC

## 2015-05-23 MED ORDER — CARVEDILOL 6.25 MG PO TABS: 6.2500 mg | ORAL_TABLET | Freq: Two times a day (BID) | ORAL | Status: DC

## 2015-05-23 NOTE — Telephone Encounter (Signed)
Spoke with pt and reminded pt of remote transmission that is due today. Pt verbalized understanding.   

## 2015-05-23 NOTE — Discharge Instructions (Signed)

## 2015-05-23 NOTE — Discharge Summary (Signed)
Discharge summary dictated on 05/23/2015 dictation number is [phone]

## 2015-05-24 NOTE — Discharge Summary (Signed)
NAME:  Parker Johnson, Parker Johnson NO.:  [id number]  MEDICAL RECORD NO.:  [id number]  LOCATION:  2H23C                        FACILITY:  [facility]  PHYSICIAN:  Eduardo Osier. Sharyn Lull, M.D. DATE OF BIRTH:  [birth year]  DATE OF ADMISSION:  05/19/2015 DATE OF DISCHARGE:  05/23/2015                              DISCHARGE SUMMARY   ADMITTING DIAGNOSES: 1. Acute on chronic decompensated systolic congestive heart failure. 2. Severe nonischemic dilated cardiomyopathy. 3. Coumadin toxicity. 4. Hypertension. 5. History of paroxysmal nonsustained ventricular tachycardia and     atrial fibrillation in the past. 6. History of cardiogenic non-Q-wave myocardial infarction in the     past. 7. History of cardioembolism to the right arm, status post     thrombectomy in July of 2014. 8. History of polysubstance abuse, EtOH abuse, and tobacco abuse in     the past. 9. History of gouty arthritis. 10.Chronic kidney history disease, stage 3.  DISCHARGE DIAGNOSES: 1. Compensated acute on chronic systolic congestive heart failure,     severe nonischemic dilated cardiomyopathy, status post Coumadin     toxicity, hypertension. 2. History of paroxysmal nonsustained ventricular tachycardia. 3. History of paroxysmal atrial fibrillation in the past. 4. History of cardiogenic non-Q-wave myocardial infarction in the     past. 5. History of cardiogenic emboli to the right arm, status post     thrombectomy in July 2014. 6. History of polysubstance abuse, EtOH abuse, and tobacco abuse in     the past. 7. Gouty arthritis. 8. Chronic kidney disease, stage 3, improved.  DISCHARGE HOME MEDICATIONS: 1. Albuterol inhaler 2 puffs every 6 hours as needed. 2. Allopurinol 100 mg 1 tablet daily. 3. Carvedilol 6.25 mg twice daily. 4. Digoxin 0.25 mg daily. 5. Lisinopril 20 mg daily. 6. Omega-3 one tablet daily. 7. Spacer aero use as directed, spironolactone 25 mg daily. 8. Torsemide 40 mg twice daily. 9. Vitamin B12  at 1000 mcg daily. 10.Warfarin 10 mg daily.  The patient has been advised to stop aspirin, colchicine, doxycycline, and prednisone.  Low salt, low cholesterol diet.  The patient has been advised to monitor weights daily and chart heart failure instructions have been given.  CONDITION AT DISCHARGE:  Stable.  BRIEF HISTORY AND HOSPITAL COURSE:  Parker Johnson is a 49 year old male with past medical history significant for multiple medical problems, i.e., 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, history of recurrent congestive heart failure secondary to depressed LV systolic function, hypertension, history of paroxysmal nonsustained VT, atrial fibrillation in the past, history of cardiogenic non-Q-wave MI in the past, status post cardiac emboli to the right arm, status post thrombectomy in July of 2014, history of alcohol abuse, tobacco abuse, and polysubstance abuse in the past, history of gouty arthritis.  He came to the ER complaining of progressive increasing shortness of breath associated with leg swelling for 1 week.  The patient also gives history of PND, orthopnea.  States, he has gained approximately 10 pounds in last 1 week.  The patient denies any noncompliance to medication, but states has been eating outside food lately.  Denies any cough, fever, or chills.  Denies any anginal chest pain, nausea, vomiting,  diaphoresis. Also complains of musculoskeletal right shoulder pain.  Denies any ICD discharges.  The patient was noted to have markedly elevated BNP of above 3000.  PHYSICAL EXAMINATION:  GENERAL:  He was alert, awake, oriented x3. VITAL SIGNS:  Blood pressure was 126/98, pulse 89.  He had low grade temp of 100 degree Fahrenheit. HEENT:  Conjunctivae pink. NECK:  Supple.  Positive JVD. LUNGS:  Decreased breath sounds at bases with faint bilateral rales. CARDIOVASCULAR:  Regular rate and rhythm, S1-S2 soft.  There was soft systolic murmur and  S3 gallop. ABDOMEN:  Soft.  Bowel sounds present, nontender. EXTREMITIES:  There is no clubbing or cyanosis, 2+ edema was noted.  LABORATORY DATA:  Sodium was 135, potassium 3.6, BUN 16, creatinine 1.40.  BNP was 3083, hemoglobin was 15, hematocrit 44.9, white count of 5.9.  Repeat BNP was 1073, 1000, and then 1084.  Last electrolytes; sodium 136, potassium 4.4, BUN 14, creatinine 1.20.  Hemoglobin was 14.3, hematocrit 43.1, white count of 5.9.  Admission PT was 44.2, INR 4.89.  Today; PT is 19.4, INR 1.64.  His blood sugar is 167.  BRIEF HOSPITAL COURSE:  The patient was admitted to step-down unit.  MI was ruled out.  In the step-down unit, the patient was started on IV Milrinone and torsemide was switched to IV Lasix with good diuresis. The patient did not have any episodes of chest pain during the hospital stay.  The Coumadin was held for 2 days and was restarted.  His INR is down to now subtherapeutic level.  The patient will receive 10 mg of Coumadin today prior to the discharge.  His Milrinone has been weaned off slowly.  The patient lost approximately 8 pounds during the hospital stay with good diuresis.  His renal function has remained stable.  The patient will be discharged home on above medications and will be followed up in my office in 1 week.  The patient has been advised to comply with medications and diet and heart failure instructions have been given.     Eduardo Osier. Sharyn Lull, M.D.     [facility]  D:  05/23/2015  T:  05/24/2015  Job:  [id number]

## 2015-05-26 ENCOUNTER — Encounter: Payer: Self-pay | Admitting: Internal Medicine

## 2015-05-31 ENCOUNTER — Encounter: Payer: Self-pay | Admitting: Internal Medicine

## 2015-05-31 ENCOUNTER — Ambulatory Visit (INDEPENDENT_AMBULATORY_CARE_PROVIDER_SITE_OTHER): Payer: Medicaid Other | Admitting: *Deleted

## 2015-05-31 DIAGNOSIS — I429 Cardiomyopathy, unspecified: Secondary | ICD-10-CM

## 2015-05-31 DIAGNOSIS — I428 Other cardiomyopathies: Secondary | ICD-10-CM

## 2015-06-01 NOTE — Progress Notes (Signed)
Loop recorder 

## 2015-06-10 LAB — CUP PACEART REMOTE DEVICE CHECK
Brady Statistic RV Percent Paced: 0 %
Date Time Interrogation Session: 20160705124000
HIGH POWER IMPEDANCE MEASURED VALUE: 54 Ohm
Lead Channel Impedance Value: 501 Ohm
Lead Channel Impedance Value: 551 Ohm
Lead Channel Pacing Threshold Amplitude: 0.4 V
Lead Channel Pacing Threshold Pulse Width: 0.4 ms
Lead Channel Pacing Threshold Pulse Width: 0.4 ms
Lead Channel Setting Pacing Amplitude: 2.4 V
Lead Channel Setting Pacing Pulse Width: 0.4 ms
MDC IDC MSMT BATTERY REMAINING LONGEVITY: 66 mo
MDC IDC MSMT BATTERY REMAINING PERCENTAGE: 76 %
MDC IDC MSMT LEADCHNL RA PACING THRESHOLD AMPLITUDE: 0.7 V
MDC IDC SET LEADCHNL RA PACING AMPLITUDE: 2 V
MDC IDC SET LEADCHNL RV SENSING SENSITIVITY: 0.6 mV
MDC IDC SET ZONE DETECTION INTERVAL: 300 ms
MDC IDC STAT BRADY RA PERCENT PACED: 0 %
Pulse Gen Serial Number: 143406
Zone Setting Detection Interval: 250 ms
Zone Setting Detection Interval: 353 ms

## 2015-07-06 ENCOUNTER — Encounter: Payer: Self-pay | Admitting: Cardiology

## 2015-07-28 ENCOUNTER — Encounter (HOSPITAL_COMMUNITY): Payer: Self-pay | Admitting: *Deleted

## 2015-07-28 ENCOUNTER — Emergency Department (HOSPITAL_COMMUNITY)
Admission: EM | Admit: 2015-07-28 | Discharge: 2015-07-28 | Disposition: A | Discharge status: Home / Self Care | Payer: Medicaid Other | Attending: Emergency Medicine | Admitting: Emergency Medicine

## 2015-07-28 ENCOUNTER — Emergency Department (HOSPITAL_COMMUNITY): Payer: Medicaid Other

## 2015-07-28 DIAGNOSIS — M109 Gout, unspecified: Secondary | ICD-10-CM | POA: Insufficient documentation

## 2015-07-28 DIAGNOSIS — M25511 Pain in right shoulder: Secondary | ICD-10-CM | POA: Diagnosis not present

## 2015-07-28 DIAGNOSIS — I10 Essential (primary) hypertension: Secondary | ICD-10-CM | POA: Insufficient documentation

## 2015-07-28 DIAGNOSIS — Z79899 Other long term (current) drug therapy: Secondary | ICD-10-CM | POA: Insufficient documentation

## 2015-07-28 DIAGNOSIS — I5023 Acute on chronic systolic (congestive) heart failure: Secondary | ICD-10-CM | POA: Insufficient documentation

## 2015-07-28 DIAGNOSIS — Z720 Tobacco use: Secondary | ICD-10-CM | POA: Diagnosis not present

## 2015-07-28 DIAGNOSIS — Z95810 Presence of automatic (implantable) cardiac defibrillator: Secondary | ICD-10-CM | POA: Diagnosis not present

## 2015-07-28 DIAGNOSIS — R0602 Shortness of breath: Secondary | ICD-10-CM | POA: Diagnosis not present

## 2015-07-28 DIAGNOSIS — Z7901 Long term (current) use of anticoagulants: Secondary | ICD-10-CM | POA: Diagnosis not present

## 2015-07-28 DIAGNOSIS — R079 Chest pain, unspecified: Secondary | ICD-10-CM | POA: Diagnosis present

## 2015-07-28 LAB — TROPONIN I: Troponin I: 0.05 ng/mL — ABNORMAL HIGH (ref <0.031)

## 2015-07-28 LAB — BASIC METABOLIC PANEL
Anion gap: 10 (ref 5–15)
BUN: 28 mg/dL — ABNORMAL HIGH (ref 6–20)
CHLORIDE: 98 mmol/L — AB (ref 101–111)
CO2: 25 mmol/L (ref 22–32)
Calcium: 9.6 mg/dL (ref 8.9–10.3)
Creatinine, Ser: 1.54 mg/dL — ABNORMAL HIGH (ref 0.61–1.24)
GFR calc non Af Amer: 52 mL/min — ABNORMAL LOW (ref >60)
GFR, EST AFRICAN AMERICAN: 60 mL/min — AB (ref >60)
Glucose, Bld: 157 mg/dL — ABNORMAL HIGH (ref 65–99)
Potassium: 4.6 mmol/L (ref 3.5–5.1)
SODIUM: 133 mmol/L — AB (ref 135–145)

## 2015-07-28 LAB — CBC
HEMATOCRIT: 45 % (ref 39.0–52.0)
Hemoglobin: 15.3 g/dL (ref 13.0–17.0)
MCH: 30.4 pg (ref 26.0–34.0)
MCHC: 34 g/dL (ref 30.0–36.0)
MCV: 89.5 fL (ref 78.0–100.0)
PLATELETS: 182 10*3/uL (ref 150–400)
RBC: 5.03 MIL/uL (ref 4.22–5.81)
RDW: 18.5 % — ABNORMAL HIGH (ref 11.5–15.5)
WBC: 6.4 10*3/uL (ref 4.0–10.5)

## 2015-07-28 LAB — PROTIME-INR
INR: 4.49 — AB (ref 0.00–1.49)
Prothrombin Time: 41.4 seconds — ABNORMAL HIGH (ref 11.6–15.2)

## 2015-07-28 LAB — BRAIN NATRIURETIC PEPTIDE: B NATRIURETIC PEPTIDE 5: 3248.8 pg/mL — AB (ref 0.0–100.0)

## 2015-07-28 MED ORDER — FUROSEMIDE 10 MG/ML IJ SOLN
100.0000 mg | Freq: Once | INTRAVENOUS | Status: AC
  Administered 2015-07-28: 100 mg via INTRAVENOUS
  Filled 2015-07-28: qty 10

## 2015-07-28 MED ORDER — MORPHINE SULFATE (PF) 4 MG/ML IV SOLN
4.0000 mg | Freq: Once | INTRAVENOUS | Status: AC
  Administered 2015-07-28: 4 mg via INTRAVENOUS
  Filled 2015-07-28: qty 1

## 2015-07-28 NOTE — ED Provider Notes (Signed)
CSN: [id number]     Arrival date & time 07/28/15  0002 History  This chart was scribed for Azalia Bilis, MD by Lyndel Safe, ED Scribe. This patient was seen in room A12C/A12C and the patient's care was started 1:39 AM.   Chief Complaint  Patient presents with  . Shortness of Breath  . Chest Pain    The history is provided by the patient. No language interpreter was used.   HPI Comments: Parker Johnson is a 49 y.o. male, with a PMhx of HTN, CHF, cardiomyopathy, and ventricular tachycardia, who presents to the Emergency Department complaining of gradually worsening, constant, moderate right shoulder pain onset 2 weeks ago. He also c/o SOB and cough onset 3 days ago. Pt describes the pain in his right arm to be a throbbing pain. He reports this pain and SOB feel similar to the symptoms he experienced when he was diagnosed with a DVT in RUE. Pt was hospitalized in June for an acute exacerbation of CHF. Pt is on warfarin and  Lasix daily.  Patient reports compliance with medications.  Past Medical History  Diagnosis Date  . Ventricular tachycardia   . AICD (automatic cardioverter/defibrillator) present     [employer] 100  . Tobacco abuse   . Alcohol abuse     hx  . Cardiomyopathy     secondary  . HTN (hypertension)     uncontrolled  . CHF (congestive heart failure)   . Gout    Past Surgical History  Procedure Laterality Date  . Cardiac defibrillator placement  2010    [employer] 100  . Embolectomy Right 06/04/2013    Procedure: Upper Extremity Thrombectomy;  Surgeon: Nada Libman, MD;  Location: [facility] OR;  Service: Vascular;  Laterality: Right;   Family History  Problem Relation Age of Onset  . Cardiomyopathy Father     on a defib  . Diabetes Father    Social History  Substance Use Topics  . Smoking status: Current Every Day Smoker -- 0.50 packs/day for 30 years    Types: Cigarettes  . Smokeless tobacco: Never Used     Comment: I already  have information on quitting"  . Alcohol Use: 0.6 oz/week    1 Cans of beer per week    Review of Systems  Respiratory: Positive for cough and shortness of breath.   Musculoskeletal: Positive for arthralgias ( right shoulder ).  All other systems reviewed and are negative.  Allergies  Review of patient's allergies indicates no known allergies.  Home Medications   Prior to Admission medications   Medication Sig Start Date End Date Taking? Authorizing Provider  albuterol (PROVENTIL HFA;VENTOLIN HFA) 108 (90 BASE) MCG/ACT inhaler Inhale 2 puffs into the lungs every 6 (six) hours as needed for wheezing or shortness of breath. 04/14/15   Ozella Rocks, MD  allopurinol (ZYLOPRIM) 100 MG tablet Take 100 mg by mouth daily.    Historical Provider, MD  carvedilol (COREG) 6.25 MG tablet Take 1 tablet (6.25 mg total) by mouth 2 (two) times daily with a meal. 05/23/15   Rinaldo Cloud, MD  digoxin (LANOXIN) 0.25 MG tablet Take 0.25 mg by mouth daily.    Historical Provider, MD  lisinopril (PRINIVIL,ZESTRIL) 20 MG tablet Take 20 mg by mouth daily.      Historical Provider, MD  nicotine (NICODERM CQ - DOSED IN MG/24 HOURS) 14 mg/24hr patch Place 1 patch (14 mg total) onto the skin daily. 05/23/15   Rinaldo Cloud,  MD  Omega-3 Fatty Acids (OMEGA 3 PO) Take 1 tablet by mouth daily.    Historical Provider, MD  Spacer/Aero-Holding Chambers DEVI Use as directed 04/14/15   Ozella Rocks, MD  spironolactone (ALDACTONE) 25 MG tablet Take 25 mg by mouth daily.      Historical Provider, MD  torsemide (DEMADEX) 20 MG tablet Take 40 mg by mouth 2 (two) times daily.    Historical Provider, MD  vitamin B-12 (CYANOCOBALAMIN) 1000 MCG tablet Take 1,000 mcg by mouth daily.    Historical Provider, MD  warfarin (COUMADIN) 10 MG tablet Take 10 mg by mouth daily.    Historical Provider, MD   BP 132/96 mmHg  Pulse 103  Resp 16  Ht 5\' 7"  (1.702 m)  Wt 214 lb (97.07 kg)  BMI 33.51 kg/m2  SpO2 97% Physical Exam   Constitutional: He is oriented to person, place, and time. He appears well-developed and well-nourished.  HENT:  Head: Normocephalic and atraumatic.  Eyes: EOM are normal.  Neck: Normal range of motion.  Cardiovascular: Normal rate, regular rhythm, normal heart sounds and intact distal pulses.   Normal right radial pulse  Pulmonary/Chest: Effort normal and breath sounds normal. No respiratory distress.  Abdominal: Soft. He exhibits no distension. There is no tenderness.  Musculoskeletal: Normal range of motion.  Tenderness of right trapezius muscle  Neurological: He is alert and oriented to person, place, and time.  Skin: Skin is warm and dry.  Psychiatric: He has a normal mood and affect. Judgment normal.  Nursing note and vitals reviewed.   ED Course  Procedures  DIAGNOSTIC STUDIES: Oxygen Saturation is 97% on RA, normal by my interpretation.    COORDINATION OF CARE: 1:47 AM Discussed treatment plan with pt. Will order 100mg  Lasix and pain medication. Pt acknowledges and agrees to plan.   Labs Review Labs Reviewed  BASIC METABOLIC PANEL - Abnormal; Notable for the following:    Sodium 133 (*)    Chloride 98 (*)    Glucose, Bld 157 (*)    BUN 28 (*)    Creatinine, Ser 1.54 (*)    GFR calc non Af Amer 52 (*)    GFR calc Af Amer 60 (*)    All other components within normal limits  CBC - Abnormal; Notable for the following:    RDW 18.5 (*)    All other components within normal limits  TROPONIN I - Abnormal; Notable for the following:    Troponin I 0.05 (*)    All other components within normal limits  PROTIME-INR - Abnormal; Notable for the following:    Prothrombin Time 41.4 (*)    INR 4.49 (*)    All other components within normal limits  BRAIN NATRIURETIC PEPTIDE    Imaging Review Dg Chest 2 View  07/28/2015   CLINICAL DATA:  Chest pain and right scapular pain tonight with dyspnea.  EXAM: CHEST  2 VIEW  COMPARISON:  05/19/2015  FINDINGS: There is moderate  unchanged cardiomegaly. There are intact appearances of the transvenous leads. There is mild vascular prominence and minimal interstitial thickening in the basilar periphery. There is no alveolar opacity. There is no effusion.  IMPRESSION: Mild congestive changes in the vasculature and interstitium. Unchanged cardiomegaly. No alveolar edema. No effusion.   Electronically Signed   By: Ellery Plunk M.D.   On: 07/28/2015 01:37   I have personally reviewed and evaluated these images and lab results as part of my medical decision-making.   EKG Interpretation  Date/Time:  Thursday July 28 2015 00:11:51 EDT Ventricular Rate:  111 PR Interval:  158 QRS Duration: 94 QT Interval:  366 QTC Calculation: 497 R Axis:   -104 Text Interpretation:  Sinus tachycardia with occasional Premature  ventricular complexes Biatrial enlargement Right superior axis deviation  Pulmonary disease pattern Septal infarct , age undetermined Abnormal ECG  No significant change was found Confirmed by Yarisa Lynam  MD, Caryn Bee ([id number]) on  07/28/2015 4:30:24 AM      MDM   Final diagnoses:  None    Patient feels much better after pain medication.  He has no swelling of his right upper extremity to suggest DVT.  He is supratherapeutic on his INR.  I will have him hold his next dose of Coumadin and follow-up with his Coumadin team.  In regards to his shortness of breath I believe this is an acute on chronic congestive heart failure exacerbation.  This is mild and at this time he has no significant orthopnea.  He has no hypoxia.  He can ambulate without significant shortness of breath.  He was given a dose of Lasix in the emergency department is feeling better at this time.  He will take a double dose of his Lasix in the morning and have asked that he see his cardiologist in the next 24 hours.  Patient understands return to the emergency department for any new or worsening symptoms.  Is documented that he had O2 sats of 85%  while in the emergency department but this was only while he was sleeping and I suspect that he obstructed somewhat.  He is ambulated without difficulty and has no hypoxia with ambulation.  I personally performed the services described in this documentation, which was scribed in my presence. The recorded information has been reviewed and is accurate.      Azalia Bilis, MD 07/28/15 [phone]

## 2015-07-28 NOTE — Discharge Instructions (Signed)
Please take a double dose of your Lasix this morning.  Please do not take your Coumadin today.  Please call Dr. Sharyn Lull and follow-up with him in his office later today.  Please return the emergency department for any new or worsening symptoms.

## 2015-07-28 NOTE — ED Notes (Signed)
Patient presents stating he has been SOB and has had pain to the right shoulder like he did in the past when he had a blokage in the artery in the right arm.

## 2015-08-30 ENCOUNTER — Encounter: Payer: Self-pay | Admitting: *Deleted

## 2015-09-27 ENCOUNTER — Encounter: Payer: Self-pay | Admitting: *Deleted

## 2015-10-01 ENCOUNTER — Emergency Department (HOSPITAL_COMMUNITY): Payer: Medicaid Other

## 2015-10-01 ENCOUNTER — Inpatient Hospital Stay (HOSPITAL_COMMUNITY)
Admission: EM | Admit: 2015-10-01 | Discharge: 2015-10-05 | DRG: 291 | Disposition: A | Discharge status: Home / Self Care | Payer: Medicaid Other | Attending: Cardiovascular Disease | Admitting: Cardiovascular Disease

## 2015-10-01 ENCOUNTER — Encounter (HOSPITAL_COMMUNITY): Payer: Self-pay | Admitting: *Deleted

## 2015-10-01 DIAGNOSIS — Z79899 Other long term (current) drug therapy: Secondary | ICD-10-CM | POA: Diagnosis not present

## 2015-10-01 DIAGNOSIS — I252 Old myocardial infarction: Secondary | ICD-10-CM | POA: Diagnosis not present

## 2015-10-01 DIAGNOSIS — I130 Hypertensive heart and chronic kidney disease with heart failure and stage 1 through stage 4 chronic kidney disease, or unspecified chronic kidney disease: Secondary | ICD-10-CM | POA: Diagnosis not present

## 2015-10-01 DIAGNOSIS — T887XXA Unspecified adverse effect of drug or medicament, initial encounter: Secondary | ICD-10-CM | POA: Diagnosis present

## 2015-10-01 DIAGNOSIS — I5021 Acute systolic (congestive) heart failure: Secondary | ICD-10-CM | POA: Diagnosis present

## 2015-10-01 DIAGNOSIS — M1000 Idiopathic gout, unspecified site: Secondary | ICD-10-CM | POA: Diagnosis present

## 2015-10-01 DIAGNOSIS — Z7901 Long term (current) use of anticoagulants: Secondary | ICD-10-CM

## 2015-10-01 DIAGNOSIS — I5023 Acute on chronic systolic (congestive) heart failure: Secondary | ICD-10-CM | POA: Diagnosis present

## 2015-10-01 DIAGNOSIS — N183 Chronic kidney disease, stage 3 (moderate): Secondary | ICD-10-CM | POA: Diagnosis present

## 2015-10-01 DIAGNOSIS — Z95810 Presence of automatic (implantable) cardiac defibrillator: Secondary | ICD-10-CM

## 2015-10-01 DIAGNOSIS — T45515A Adverse effect of anticoagulants, initial encounter: Secondary | ICD-10-CM | POA: Diagnosis present

## 2015-10-01 DIAGNOSIS — I509 Heart failure, unspecified: Secondary | ICD-10-CM

## 2015-10-01 DIAGNOSIS — F17210 Nicotine dependence, cigarettes, uncomplicated: Secondary | ICD-10-CM | POA: Diagnosis present

## 2015-10-01 DIAGNOSIS — Y929 Unspecified place or not applicable: Secondary | ICD-10-CM

## 2015-10-01 DIAGNOSIS — I4891 Unspecified atrial fibrillation: Secondary | ICD-10-CM | POA: Diagnosis present

## 2015-10-01 DIAGNOSIS — Z86718 Personal history of other venous thrombosis and embolism: Secondary | ICD-10-CM

## 2015-10-01 DIAGNOSIS — Z23 Encounter for immunization: Secondary | ICD-10-CM

## 2015-10-01 DIAGNOSIS — I420 Dilated cardiomyopathy: Secondary | ICD-10-CM | POA: Diagnosis present

## 2015-10-01 LAB — BASIC METABOLIC PANEL
ANION GAP: 18 — AB (ref 5–15)
BUN: 48 mg/dL — ABNORMAL HIGH (ref 6–20)
CO2: 28 mmol/L (ref 22–32)
Calcium: 9.5 mg/dL (ref 8.9–10.3)
Chloride: 87 mmol/L — ABNORMAL LOW (ref 101–111)
Creatinine, Ser: 2.39 mg/dL — ABNORMAL HIGH (ref 0.61–1.24)
GFR calc non Af Amer: 30 mL/min — ABNORMAL LOW (ref >60)
GFR, EST AFRICAN AMERICAN: 35 mL/min — AB (ref >60)
Glucose, Bld: 168 mg/dL — ABNORMAL HIGH (ref 65–99)
Potassium: 4.2 mmol/L (ref 3.5–5.1)
Sodium: 133 mmol/L — ABNORMAL LOW (ref 135–145)

## 2015-10-01 LAB — URINALYSIS, ROUTINE W REFLEX MICROSCOPIC
GLUCOSE, UA: NEGATIVE mg/dL
KETONES UR: NEGATIVE mg/dL
Leukocytes, UA: NEGATIVE
NITRITE: NEGATIVE
PH: 5 (ref 5.0–8.0)
Protein, ur: 100 mg/dL — AB
Specific Gravity, Urine: 1.012 (ref 1.005–1.030)
Urobilinogen, UA: 1 mg/dL (ref 0.0–1.0)

## 2015-10-01 LAB — URINE MICROSCOPIC-ADD ON

## 2015-10-01 LAB — CBC
HCT: 43.2 % (ref 39.0–52.0)
HEMOGLOBIN: 14.7 g/dL (ref 13.0–17.0)
MCH: 29.6 pg (ref 26.0–34.0)
MCHC: 34 g/dL (ref 30.0–36.0)
MCV: 87.1 fL (ref 78.0–100.0)
Platelets: 172 10*3/uL (ref 150–400)
RBC: 4.96 MIL/uL (ref 4.22–5.81)
RDW: 18.5 % — ABNORMAL HIGH (ref 11.5–15.5)
WBC: 5.6 10*3/uL (ref 4.0–10.5)

## 2015-10-01 LAB — TROPONIN I: Troponin I: 0.05 ng/mL — ABNORMAL HIGH (ref <0.031)

## 2015-10-01 LAB — PROTIME-INR
INR: 5.13 — AB (ref 0.00–1.49)
PROTHROMBIN TIME: 45.8 s — AB (ref 11.6–15.2)

## 2015-10-01 LAB — I-STAT TROPONIN, ED: TROPONIN I, POC: 0.03 ng/mL (ref 0.00–0.08)

## 2015-10-01 LAB — BRAIN NATRIURETIC PEPTIDE: B Natriuretic Peptide: 4016.9 pg/mL — ABNORMAL HIGH (ref 0.0–100.0)

## 2015-10-01 MED ORDER — CARVEDILOL 6.25 MG PO TABS
6.2500 mg | ORAL_TABLET | Freq: Two times a day (BID) | ORAL | Status: DC
  Administered 2015-10-01 – 2015-10-05 (×9): 6.25 mg via ORAL
  Filled 2015-10-01 (×9): qty 1

## 2015-10-01 MED ORDER — SPIRONOLACTONE 25 MG PO TABS
25.0000 mg | ORAL_TABLET | Freq: Every day | ORAL | Status: DC
  Administered 2015-10-02 – 2015-10-05 (×4): 25 mg via ORAL
  Filled 2015-10-01 (×4): qty 1

## 2015-10-01 MED ORDER — ACETAMINOPHEN 325 MG PO TABS
650.0000 mg | ORAL_TABLET | ORAL | Status: DC | PRN
Start: 2015-10-01 — End: 2015-10-05

## 2015-10-01 MED ORDER — ONDANSETRON HCL 4 MG/2ML IJ SOLN: 4.0000 mg | Freq: Four times a day (QID) | INTRAMUSCULAR | Status: DC | PRN

## 2015-10-01 MED ORDER — DIGOXIN 250 MCG PO TABS
0.2500 mg | ORAL_TABLET | Freq: Every day | ORAL | Status: DC
  Administered 2015-10-02 – 2015-10-05 (×4): 0.25 mg via ORAL
  Filled 2015-10-01 (×4): qty 1

## 2015-10-01 MED ORDER — SODIUM CHLORIDE 0.9 % IJ SOLN
3.0000 mL | Freq: Two times a day (BID) | INTRAMUSCULAR | Status: DC
  Administered 2015-10-01 – 2015-10-04 (×6): 3 mL via INTRAVENOUS

## 2015-10-01 MED ORDER — SODIUM CHLORIDE 0.9 % IV SOLN: 250.0000 mL | INTRAVENOUS | Status: DC | PRN

## 2015-10-01 MED ORDER — FUROSEMIDE 10 MG/ML IJ SOLN
80.0000 mg | Freq: Two times a day (BID) | INTRAMUSCULAR | Status: DC
  Administered 2015-10-02 – 2015-10-04 (×5): 80 mg via INTRAVENOUS
  Filled 2015-10-01 (×5): qty 8

## 2015-10-01 MED ORDER — ALLOPURINOL 100 MG PO TABS
100.0000 mg | ORAL_TABLET | Freq: Every day | ORAL | Status: DC
  Administered 2015-10-02 – 2015-10-05 (×4): 100 mg via ORAL
  Filled 2015-10-01 (×4): qty 1

## 2015-10-01 MED ORDER — SODIUM CHLORIDE 0.9 % IJ SOLN: 3.0000 mL | INTRAMUSCULAR | Status: DC | PRN

## 2015-10-01 MED ORDER — ADULT MULTIVITAMIN W/MINERALS CH
1.0000 | ORAL_TABLET | Freq: Every day | ORAL | Status: DC
  Administered 2015-10-02 – 2015-10-05 (×4): 1 via ORAL
  Filled 2015-10-01 (×4): qty 1

## 2015-10-01 MED ORDER — HEPARIN SODIUM (PORCINE) 5000 UNIT/ML IJ SOLN: 5000.0000 [IU] | Freq: Three times a day (TID) | INTRAMUSCULAR | Status: DC

## 2015-10-01 MED ORDER — ASPIRIN EC 81 MG PO TBEC
81.0000 mg | DELAYED_RELEASE_TABLET | Freq: Every day | ORAL | Status: DC
  Administered 2015-10-02 – 2015-10-05 (×4): 81 mg via ORAL
  Filled 2015-10-01 (×4): qty 1

## 2015-10-01 NOTE — H&P (Signed)
Referring Physician:  [name] is an 49 y.o. male.                       Chief Complaint: Progressive increasing shortness of breath with leg swelling  HPI: 49 year old male with past medical history significant for multiple medical problems i.e. 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 Patient denies any noncompliance to medication but states has been eating outside food and drinking extra fluids lately.  He denies any cough, fever chills, chest pain, nausea, vomiting, diaphoresis or any ICD discharges. Patient was noted to have elevated BNP above 4000.  Past Medical History  Diagnosis Date  . Ventricular tachycardia ([place])   . AICD (automatic cardioverter/defibrillator) present     [employer] 100  . Tobacco abuse   . Alcohol abuse     hx  . Cardiomyopathy     secondary  . HTN (hypertension)     uncontrolled  . CHF (congestive heart failure) ([place])   . Gout       Past Surgical History  Procedure Laterality Date  . Cardiac defibrillator placement  2010    [employer] 100  . Embolectomy Right 06/04/2013    Procedure: Upper Extremity Thrombectomy;  Surgeon: Serafina Mitchell, MD;  Location: [facility] OR;  Service: Vascular;  Laterality: Right;    Family History  Problem Relation Age of Onset  . Cardiomyopathy Father     on a defib  . Diabetes Father    Social History:   reports that he has been smoking Cigarettes.  He has a 15 pack-year smoking history. He has never used smokeless tobacco. He reports that he drinks about 0.6 oz of alcohol per week. He reports that he does not use illicit drugs.  Allergies: No Known Allergies   (Not in a hospital admission)  Results for orders placed or performed during the hospital encounter of 10/01/15 (from the past 48 hour(s))  Basic metabolic panel     Status: Abnormal   Collection Time: 10/01/15  6:53 PM  Result Value Ref Range   Sodium 133 (L) 135 - 145 mmol/L   Potassium 4.2 3.5 - 5.1 mmol/L   Chloride 87 (L) 101 - 111 mmol/L   CO2 28 22 - 32 mmol/L   Glucose, Bld 168 (H) 65 - 99 mg/dL   BUN 48 (H) 6 - 20 mg/dL   Creatinine, Ser 2.39 (H) 0.61 - 1.24 mg/dL   Calcium 9.5 8.9 - 10.3 mg/dL   GFR calc non Af Amer 30 (L) >60 mL/min   GFR calc Af Amer 35 (L) >60 mL/min    Comment: (NOTE) The eGFR has been calculated using the CKD EPI equation. This calculation has not been validated in all clinical situations. eGFR's persistently <60 mL/min signify possible Chronic Kidney Disease.    Anion gap 18 (H) 5 - 15  CBC     Status: Abnormal   Collection Time: 10/01/15  6:53 PM  Result  Value Ref Range   WBC 5.6 4.0 - 10.5 K/uL   RBC 4.96 4.22 - 5.81 MIL/uL   Hemoglobin 14.7 13.0 - 17.0 g/dL   HCT 43.2 39.0 - 52.0 %   MCV 87.1 78.0 - 100.0 fL   MCH 29.6 26.0 - 34.0 pg   MCHC 34.0 30.0 - 36.0 g/dL   RDW 18.5 (H) 11.5 - 15.5 %   Platelets 172 150 - 400 K/uL  Brain natriuretic peptide     Status: Abnormal   Collection Time: 10/01/15  6:53 PM  Result Value Ref Range   B Natriuretic Peptide 4016.9 (H) 0.0 - 100.0 pg/mL  Protime-INR - (order if Patient is taking Coumadin / Warfarin)     Status: Abnormal   Collection Time: 10/01/15  6:53 PM  Result Value Ref Range   Prothrombin Time 45.8 (H) 11.6 - 15.2 seconds   INR 5.13 (HH) 0.00 - 1.49    Comment: CRITICAL RESULT CALLED TO, READ BACK BY AND VERIFIED  WITH: FERRIANOL,S RN 10/01/15 1940 WOOTEN,K   I-stat troponin, ED (not at [facility], [facility])     Status: None   Collection Time: 10/01/15  7:01 PM  Result Value Ref Range   Troponin i, poc 0.03 0.00 - 0.08 ng/mL   Comment 3            Comment: Due to the release kinetics of cTnI, a negative result within the first hours of the onset of symptoms does not rule out myocardial infarction with certainty. If myocardial infarction is still suspected, repeat the test at appropriate intervals.   Urinalysis, Routine w reflex microscopic (not at [facility])     Status: Abnormal   Collection Time: 10/01/15  9:15 PM  Result Value Ref Range   Color, Urine AMBER (A) YELLOW    Comment: BIOCHEMICALS MAY BE AFFECTED BY COLOR   APPearance CLOUDY (A) CLEAR   Specific Gravity, Urine 1.012 1.005 - 1.030   pH 5.0 5.0 - 8.0   Glucose, UA NEGATIVE NEGATIVE mg/dL   Hgb urine dipstick SMALL (A) NEGATIVE   Bilirubin Urine SMALL (A) NEGATIVE   Ketones, ur NEGATIVE NEGATIVE mg/dL   Protein, ur 100 (A) NEGATIVE mg/dL   Urobilinogen, UA 1.0 0.0 - 1.0 mg/dL   Nitrite NEGATIVE NEGATIVE   Leukocytes, UA NEGATIVE NEGATIVE  Urine microscopic-add on     Status: Abnormal   Collection Time: 10/01/15  9:15 PM  Result Value Ref Range   WBC, UA 0-2 <3 WBC/hpf   RBC / HPF 3-6 <3 RBC/hpf   Bacteria, UA RARE RARE   Casts HYALINE CASTS (A) NEGATIVE   Dg Chest 2 View  10/01/2015  CLINICAL DATA:  Increased shortness of breath and swelling. EXAM: CHEST  2 VIEW COMPARISON:  07/28/2015 FINDINGS: Mild bilateral interstitial prominence. There is no focal parenchymal opacity. There is no pleural effusion or pneumothorax. There is stable cardiomegaly. There is a dual lead cardiac pacemaker. The osseous structures are unremarkable. IMPRESSION: Cardiomegaly with mild pulmonary vascular congestion. Electronically Signed   By: Kathreen Devoid   On: 10/01/2015 19:11    Review Of Systems Constitutional: Negative for fever and chills.  Eyes: Negative  for double vision.  Respiratory: Positive for shortness of breath. Negative for hemoptysis.  Cardiovascular: Positive for leg swelling and PND. Negative for chest pain and palpitations.  Gastrointestinal: Negative for nausea and vomiting.  Genitourinary: Negative for dysuria.  Neurological: Negative for dizziness and headaches.   Blood pressure 104/63, pulse 91, temperature 97.6 F (36.4  C), temperature source Oral, resp. rate 26, height 5' 7"  (1.702 m), weight 95.482 kg (210 lb 8 oz), SpO2 99 %. Physical Exam  Constitutional: He is oriented to person, place, and time. Well built and nourished. HENT: Normocephalic and atraumatic, brown eyes, conjunctivae are normal. No scleral icterus.  Neck: Normal range of motion. Neck supple. JVD present. No tracheal deviation present. No thyromegaly present.  Cardiovascular: Normal rate and regular rhythm.Murmur (Soft systolic murmur and S3 gallop noted) heard. Respiratory: Decreased breath sound at bases with faint rales noted  GI: Soft. Bowel sounds are normal. He exhibits no distension. There is no tenderness.  Musculoskeletal: No clubbing cyanosis 2+ edema noted.  Neurological: He is alert and oriented to person, place, and time. Moves all 4 extremities.  Assessment/Plan Acute on chronic decompensated systolic heart failure Severe nonischemic dilated myopathy Coumadin toxicity Hypertension History of paroxysmal nonsustained VT and A. fib in the past History of cardiogenic non-Q-wave MI in the past History of cardio emboli to the right arm status post thrombectomy in July 2014 History of polysubstance/EtOH/tobacco abuse in the past History of gouty arthritis Chronic kidney disease stage III  Admit. IV lasix, Oxygen, Hold Ace-inhibitor, coumadin.  Birdie Riddle, MD  10/01/2015, 10:19 PM

## 2015-10-01 NOTE — ED Notes (Signed)
Pt states that he can not give urine sample now, pt states he needs lasix and took 100mg  today and has only voided once

## 2015-10-01 NOTE — ED Notes (Signed)
Attempted report 

## 2015-10-01 NOTE — ED Notes (Signed)
Patient transported to X-ray 

## 2015-10-01 NOTE — ED Provider Notes (Signed)
Patient seen/examined in the Emergency Department in conjunction with Resident Physician Provider  Patient reports increasing SOB and also dyspnea on exertion Exam : awake/alert, decreased BS noted bilaterally Plan: pt with probable CHF exacerbation Pt will likely be admitted to hospital   Parker Rhine, MD 10/01/15 1930

## 2015-10-01 NOTE — ED Notes (Signed)
Pt reports hx of chf. Having increase in sob and swelling x 1 week, no relief with lasix at home. Also having pain to left arm. ekg done at triage, airway intact.

## 2015-10-01 NOTE — ED Provider Notes (Signed)
CSN: [id number]     Arrival date & time 10/01/15  1807 History   First MD Initiated Contact with Patient 10/01/15 1832     Chief Complaint  Patient presents with  . Shortness of Breath     (Consider location/radiation/quality/duration/timing/severity/associated sxs/prior Treatment) Patient is a 49 y.o. male presenting with shortness of breath. The history is provided by the patient, medical records and a relative.  Shortness of Breath Severity:  Moderate Onset quality:  Gradual Duration:  1 week Timing:  Constant Progression:  Worsening Chronicity:  Recurrent Context: activity   Context: not URI   Relieved by:  Nothing Worsened by:  Exertion Ineffective treatments:  Rest Associated symptoms: no abdominal pain, no chest pain, no cough, no diaphoresis, no fever, no headaches, no rash, no sore throat, no syncope, no vomiting and no wheezing   Risk factors comment:  Hx of CHF   Past Medical History  Diagnosis Date  . Ventricular tachycardia (HCC)   . AICD (automatic cardioverter/defibrillator) present     [employer] 100  . Tobacco abuse   . Alcohol abuse     hx  . Cardiomyopathy     secondary  . HTN (hypertension)     uncontrolled  . CHF (congestive heart failure) (HCC)   . Gout    Past Surgical History  Procedure Laterality Date  . Cardiac defibrillator placement  2010    [employer] 100  . Embolectomy Right 06/04/2013    Procedure: Upper Extremity Thrombectomy;  Surgeon: Nada Libman, MD;  Location: [facility] OR;  Service: Vascular;  Laterality: Right;   Family History  Problem Relation Age of Onset  . Cardiomyopathy Father     on a defib  . Diabetes Father    Social History  Substance Use Topics  . Smoking status: Current Every Day Smoker -- 0.50 packs/day for 30 years    Types: Cigarettes  . Smokeless tobacco: Never Used     Comment: I already have information on quitting"  . Alcohol Use: 0.6 oz/week    1 Cans of beer per week     Review of Systems  Constitutional: Negative for fever and diaphoresis.  HENT: Negative for facial swelling and sore throat.   Respiratory: Positive for shortness of breath. Negative for cough and wheezing.   Cardiovascular: Positive for leg swelling. Negative for chest pain and syncope.  Gastrointestinal: Negative for vomiting and abdominal pain.  Genitourinary: Negative for dysuria.  Musculoskeletal: Negative for back pain.  Skin: Negative for rash.  Neurological: Negative for headaches.  Psychiatric/Behavioral: Negative for confusion.      Allergies  Review of patient's allergies indicates no known allergies.  Home Medications   Prior to Admission medications   Medication Sig Start Date End Date Taking? Authorizing Provider  allopurinol (ZYLOPRIM) 100 MG tablet Take 100 mg by mouth daily.   Yes Historical Provider, MD  carvedilol (COREG) 6.25 MG tablet Take 1 tablet (6.25 mg total) by mouth 2 (two) times daily with a meal. 05/23/15  Yes Rinaldo Cloud, MD  digoxin (LANOXIN) 0.25 MG tablet Take 0.25 mg by mouth daily.   Yes Historical Provider, MD  lisinopril (PRINIVIL,ZESTRIL) 20 MG tablet Take 20 mg by mouth daily.     Yes Historical Provider, MD  Multiple Vitamin (MULTIVITAMIN WITH MINERALS) TABS tablet Take 1 tablet by mouth daily.   Yes Historical Provider, MD  spironolactone (ALDACTONE) 25 MG tablet Take 25 mg by mouth daily.     Yes Historical Provider, MD  torsemide (DEMADEX) 20 MG tablet Take 40 mg by mouth 2 (two) times daily.   Yes Historical Provider, MD  warfarin (COUMADIN) 10 MG tablet Take 10 mg by mouth daily.   Yes Historical Provider, MD  albuterol (PROVENTIL HFA;VENTOLIN HFA) 108 (90 BASE) MCG/ACT inhaler Inhale 2 puffs into the lungs every 6 (six) hours as needed for wheezing or shortness of breath. Patient not taking: Reported on 07/28/2015 04/14/15   Ozella Rocks, MD  nicotine (NICODERM CQ - DOSED IN MG/24 HOURS) 14 mg/24hr patch Place 1 patch (14 mg total)  onto the skin daily. Patient not taking: Reported on 07/28/2015 05/23/15   Rinaldo Cloud, MD  Spacer/Aero-Holding [facility] Use as directed 04/14/15   Ozella Rocks, MD   BP 111/80 mmHg  Pulse 90  Temp(Src) 97.6 F (36.4 C) (Oral)  Resp 22  Ht  (1.702 m)  Wt 210 lb 8 oz (95.482 kg)  BMI 32.96 kg/m2  SpO2 100% Physical Exam  Constitutional: He is oriented to person, place, and time. He appears well-developed and well-nourished. No distress.  HENT:  Head: Normocephalic and atraumatic.  Right Ear: External ear normal.  Left Ear: External ear normal.  Nose: Nose normal.  Mouth/Throat: Oropharynx is clear and moist. No oropharyngeal exudate.  Eyes: Conjunctivae and EOM are normal. Pupils are equal, round, and reactive to light. Right eye exhibits no discharge. Left eye exhibits no discharge. No scleral icterus.  Neck: Normal range of motion. Neck supple. No JVD present. No tracheal deviation present. No thyromegaly present.  Cardiovascular: Normal rate, regular rhythm and intact distal pulses.   Pulmonary/Chest: Effort normal. No stridor. No respiratory distress. He has decreased breath sounds. He has no wheezes. He has no rales. He exhibits no tenderness.  Abdominal: Soft. He exhibits no distension. There is no tenderness.  Musculoskeletal: Normal range of motion. He exhibits edema. He exhibits no tenderness.  Lymphadenopathy:    He has no cervical adenopathy.  Neurological: He is alert and oriented to person, place, and time.  Skin: Skin is warm and dry. No rash noted. He is not diaphoretic. No erythema. No pallor.  Psychiatric: He has a normal mood and affect. His behavior is normal. Judgment and thought content normal.  Nursing note and vitals reviewed.   ED Course  Procedures (including critical care time) Labs Review Labs Reviewed  BASIC METABOLIC PANEL - Abnormal; Notable for the following:    Sodium 133 (*)    Chloride 87 (*)    Glucose, Bld 168 (*)    BUN 48 (*)     Creatinine, Ser 2.39 (*)    GFR calc non Af Amer 30 (*)    GFR calc Af Amer 35 (*)    Anion gap 18 (*)    All other components within normal limits  CBC - Abnormal; Notable for the following:    RDW 18.5 (*)    All other components within normal limits  BRAIN NATRIURETIC PEPTIDE - Abnormal; Notable for the following:    B Natriuretic Peptide 4016.9 (*)    All other components within normal limits  PROTIME-INR - Abnormal; Notable for the following:    Prothrombin Time 45.8 (*)    INR 5.13 (*)    All other components within normal limits  URINALYSIS, ROUTINE W REFLEX MICROSCOPIC (NOT AT [facility])  I-STAT TROPOININ, ED  I-STAT CHEM 8, ED  Rosezena Sensor, ED    Imaging Review Dg Chest 2 View  10/01/2015  CLINICAL DATA:  Increased  shortness of breath and swelling. EXAM: CHEST  2 VIEW COMPARISON:  07/28/2015 FINDINGS: Mild bilateral interstitial prominence. There is no focal parenchymal opacity. There is no pleural effusion or pneumothorax. There is stable cardiomegaly. There is a dual lead cardiac pacemaker. The osseous structures are unremarkable. IMPRESSION: Cardiomegaly with mild pulmonary vascular congestion. Electronically Signed   By: Elige Ko   On: 10/01/2015 19:11   I have personally reviewed and evaluated these images and lab results as part of my medical decision-making.   EKG Interpretation   Date/Time:  Saturday October 01 2015 18:14:00 EDT Ventricular Rate:  88 PR Interval:  168 QRS Duration: 104 QT Interval:  398 QTC Calculation: 481 R Axis:   -90 Text Interpretation:  Normal sinus rhythm Biatrial enlargement Right  superior axis deviation Incomplete right bundle branch block Inferior  infarct , age undetermined Anterior infarct , age undetermined Abnormal  ECG no change from previous Confirmed by Donnald Garre, MD, Lebron Conners [phone]) on  10/01/2015 6:26:26 PM      MDM   Final diagnoses:  Acute systolic congestive heart failure (HCC)   Pt with worsening DOE x 1  week.  Associated LE and lower torso edema.  Unresponsive to home PO diuretics.  Pt has severely reduced EF at baseline.  CXR similar to prior, but shows pleural effusions.  Pt BNP elevated to 4K.  Pt discussed with Dr. Algie Coffer o/c for Dr. Sharyn Lull.  Pt to be admitted to tele unit.  No O2 requirement currently.     Patient care was discussed with my attending, Dr. Zadie Rhine, MD.     Gavin Pound, MD 10/02/15 1605  Zadie Rhine, MD 10/03/15 [phone]

## 2015-10-02 LAB — TROPONIN I
Troponin I: 0.06 ng/mL — ABNORMAL HIGH (ref <0.031)
Troponin I: 0.1 ng/mL — ABNORMAL HIGH (ref <0.031)

## 2015-10-02 LAB — GLUCOSE, CAPILLARY
Glucose-Capillary: 138 mg/dL — ABNORMAL HIGH (ref 65–99)
Glucose-Capillary: 135 mg/dL — ABNORMAL HIGH (ref 65–99)
Glucose-Capillary: 241 mg/dL — ABNORMAL HIGH (ref 65–99)

## 2015-10-02 LAB — BASIC METABOLIC PANEL
ANION GAP: 12 (ref 5–15)
BUN: 48 mg/dL — ABNORMAL HIGH (ref 6–20)
CHLORIDE: 91 mmol/L — AB (ref 101–111)
CO2: 30 mmol/L (ref 22–32)
CREATININE: 2.23 mg/dL — AB (ref 0.61–1.24)
Calcium: 8.9 mg/dL (ref 8.9–10.3)
GFR calc non Af Amer: 33 mL/min — ABNORMAL LOW (ref >60)
GFR, EST AFRICAN AMERICAN: 38 mL/min — AB (ref >60)
Glucose, Bld: 133 mg/dL — ABNORMAL HIGH (ref 65–99)
POTASSIUM: 3.6 mmol/L (ref 3.5–5.1)
SODIUM: 133 mmol/L — AB (ref 135–145)

## 2015-10-02 LAB — URIC ACID: Uric Acid, Serum: 17.5 mg/dL — ABNORMAL HIGH (ref 4.4–7.6)

## 2015-10-02 LAB — PROTIME-INR: PROTHROMBIN TIME: 53.1 s — AB (ref 11.6–15.2)

## 2015-10-02 LAB — DIGOXIN LEVEL: DIGOXIN LVL: 0.6 ng/mL — AB (ref 0.8–2.0)

## 2015-10-02 MED ORDER — INSULIN ASPART 100 UNIT/ML ~~LOC~~ SOLN
0.0000 [IU] | Freq: Three times a day (TID) | SUBCUTANEOUS | Status: DC
  Administered 2015-10-03: 3 [IU] via SUBCUTANEOUS
  Administered 2015-10-04 (×2): 2 [IU] via SUBCUTANEOUS
  Administered 2015-10-04 – 2015-10-05 (×2): 3 [IU] via SUBCUTANEOUS
  Administered 2015-10-05: 2 [IU] via SUBCUTANEOUS

## 2015-10-02 MED ORDER — INFLUENZA VAC SPLIT QUAD 0.5 ML IM SUSY
0.5000 mL | PREFILLED_SYRINGE | INTRAMUSCULAR | Status: AC
  Administered 2015-10-03: 0.5 mL via INTRAMUSCULAR
  Filled 2015-10-02: qty 0.5

## 2015-10-02 NOTE — Progress Notes (Signed)
Ref: Rinaldo Cloud, MD   Subjective:  Breathing and leg edema are improving. Afebrile.  Objective:  Vital Signs in the last 24 hours: Temp:  [97.6 F (36.4 C)-98.6 F (37 C)] 98.6 F (37 C) (11/05 2230) Pulse Rate:  [87-93] 90 (11/05 2230) Cardiac Rhythm:  [-] Normal sinus rhythm (11/06 0800) Resp:  [18-26] 18 (11/05 2230) BP: (94-113)/(60-90) 103/60 mmHg (11/05 2230) SpO2:  [97 %-100 %] 99 % (11/05 2230) Weight:  [93.078 kg (205 lb 3.2 oz)-95.482 kg (210 lb 8 oz)] 93.078 kg (205 lb 3.2 oz) (11/05 2230)  Physical Exam: BP Readings from Last 1 Encounters:  10/01/15 103/60    Wt Readings from Last 1 Encounters:  10/01/15 93.078 kg (205 lb 3.2 oz)    Weight change:   HEENT: [place]/AT, Eyes-Brown, PERL, EOMI, Conjunctiva-Pink, Sclera-Non-icteric Neck: + JVD, No bruit, Trachea midline. Lungs:  Clear, Bilateral. Cardiac:  Regular rhythm, normal S1 and S2, no S3. II/VI systolic murmur. Abdomen:  Soft, non-tender. Extremities:  1 + edema present. No cyanosis. No clubbing. CNS: AxOx3, Cranial nerves grossly intact, moves all 4 extremities. Right handed. Skin: Warm and dry.   Intake/Output from previous day:      Lab Results: BMET    Component Value Date/Time   NA 133* 10/02/2015 0350   NA 133* 10/01/2015 1853   NA 133* 07/28/2015 0027   K 3.6 10/02/2015 0350   K 4.2 10/01/2015 1853   K 4.6 07/28/2015 0027   CL 91* 10/02/2015 0350   CL 87* 10/01/2015 1853   CL 98* 07/28/2015 0027   CO2 30 10/02/2015 0350   CO2 28 10/01/2015 1853   CO2 25 07/28/2015 0027   GLUCOSE 133* 10/02/2015 0350   GLUCOSE 168* 10/01/2015 1853   GLUCOSE 157* 07/28/2015 0027   BUN 48* 10/02/2015 0350   BUN 48* 10/01/2015 1853   BUN 28* 07/28/2015 0027   CREATININE 2.23* 10/02/2015 0350   CREATININE 2.39* 10/01/2015 1853   CREATININE 1.54* 07/28/2015 0027   CALCIUM 8.9 10/02/2015 0350   CALCIUM 9.5 10/01/2015 1853   CALCIUM 9.6 07/28/2015 0027   GFRNONAA 33* 10/02/2015 0350   GFRNONAA 30*  10/01/2015 1853   GFRNONAA 52* 07/28/2015 0027   GFRAA 38* 10/02/2015 0350   GFRAA 35* 10/01/2015 1853   GFRAA 60* 07/28/2015 0027   CBC    Component Value Date/Time   WBC 5.6 10/01/2015 1853   RBC 4.96 10/01/2015 1853   HGB 14.7 10/01/2015 1853   HCT 43.2 10/01/2015 1853   PLT 172 10/01/2015 1853   MCV 87.1 10/01/2015 1853   MCH 29.6 10/01/2015 1853   MCHC 34.0 10/01/2015 1853   RDW 18.5* 10/01/2015 1853   LYMPHSABS 2.2 05/19/2015 1950   MONOABS 0.4 05/19/2015 1950   EOSABS 0.1 05/19/2015 1950   BASOSABS 0.1 05/19/2015 1950   HEPATIC Function Panel  Recent Labs  03/13/15 1023 03/13/15 1545 05/19/15 1950  PROT 6.8 6.8 7.2   HEMOGLOBIN A1C No components found for: HGA1C,  MPG CARDIAC ENZYMES Lab Results  Component Value Date   CKTOTAL 107 06/04/2012   CKMB 1.9 06/04/2012   TROPONINI 0.06* 10/02/2015   TROPONINI 0.05* 10/01/2015   TROPONINI 0.05* 07/28/2015   BNP No results for input(s): PROBNP in the last 8760 hours. TSH  Recent Labs  03/13/15 1545 05/19/15 1900  TSH 1.143 2.467   CHOLESTEROL No results for input(s): CHOL in the last 8760 hours.  Scheduled Meds: . allopurinol  100 mg Oral Daily  . aspirin EC  81 mg Oral Daily  . carvedilol  6.25 mg Oral BID WC  . digoxin  0.25 mg Oral Daily  . furosemide  80 mg Intravenous Q12H  . multivitamin with minerals  1 tablet Oral Daily  . sodium chloride  3 mL Intravenous Q12H  . spironolactone  25 mg Oral Daily   Continuous Infusions:  PRN Meds:.sodium chloride, acetaminophen, ondansetron (ZOFRAN) IV, sodium chloride  Assessment/Plan: Acute on chronic decompensated systolic heart failure Severe nonischemic dilated myopathy Coumadin toxicity Hypertension History of paroxysmal nonsustained VT and A. 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    LOS: 1 day    Orpah Cobb  MD  10/02/2015, 10:50 AM

## 2015-10-02 NOTE — Progress Notes (Signed)
Utilization Review Completed.[employer] T11/04/2015  

## 2015-10-02 NOTE — Progress Notes (Signed)
CRITICAL VALUE ALERT  Critical value received:  INR >5.0  Date of notification:  10/02/15  Time of notification:  1315  Critical value read back:Yes.    Nurse who received alert: Alvin Critchley, RN  MD notified (1st page):  Dr. Algie Coffer  Responding MD:  Dr. Algie Coffer  Will continue to hold Coumadin and monitor patient. Patient is stable.

## 2015-10-03 LAB — BASIC METABOLIC PANEL
Anion gap: 14 (ref 5–15)
BUN: 49 mg/dL — AB (ref 6–20)
CALCIUM: 9.3 mg/dL (ref 8.9–10.3)
CO2: 30 mmol/L (ref 22–32)
CREATININE: 2.13 mg/dL — AB (ref 0.61–1.24)
Chloride: 88 mmol/L — ABNORMAL LOW (ref 101–111)
GFR calc Af Amer: 41 mL/min — ABNORMAL LOW (ref >60)
GFR, EST NON AFRICAN AMERICAN: 35 mL/min — AB (ref >60)
GLUCOSE: 152 mg/dL — AB (ref 65–99)
Potassium: 3.8 mmol/L (ref 3.5–5.1)
Sodium: 132 mmol/L — ABNORMAL LOW (ref 135–145)

## 2015-10-03 LAB — HEMOGLOBIN A1C
HEMOGLOBIN A1C: 7.8 % — AB (ref 4.8–5.6)
MEAN PLASMA GLUCOSE: 177 mg/dL

## 2015-10-03 LAB — GLUCOSE, CAPILLARY
GLUCOSE-CAPILLARY: 141 mg/dL — AB (ref 65–99)
GLUCOSE-CAPILLARY: 169 mg/dL — AB (ref 65–99)
Glucose-Capillary: 164 mg/dL — ABNORMAL HIGH (ref 65–99)
Glucose-Capillary: 153 mg/dL — ABNORMAL HIGH (ref 65–99)

## 2015-10-03 MED ORDER — PHYTONADIONE 5 MG PO TABS
10.0000 mg | ORAL_TABLET | Freq: Once | ORAL | Status: DC
  Filled 2015-10-03: qty 2

## 2015-10-03 MED ORDER — VITAMIN K1 10 MG/ML IJ SOLN
1.0000 mg | Freq: Once | INTRAVENOUS | Status: AC
  Administered 2015-10-03: 1 mg via INTRAVENOUS
  Filled 2015-10-03: qty 0.1

## 2015-10-03 NOTE — Progress Notes (Signed)
Subjective:  Patient denies any chest pain.  States breathing and leg swelling is slowly improving.  INR remains elevated, received one dose of vitamin K earlier this morning  Objective:  Vital Signs in the last 24 hours: Temp:  [97.7 F (36.5 C)-98.3 F (36.8 C)] 98.3 F (36.8 C) (11/07 0419) Pulse Rate:  [75-82] 75 (11/07 0842) Resp:  [18] 18 (11/06 2026) BP: (100-104)/(70-71) 104/71 mmHg (11/07 0842) SpO2:  [100 %] 100 % (11/07 0419) Weight:  [94.076 kg (207 lb 6.4 oz)] 94.076 kg (207 lb 6.4 oz) (11/07 0419)  Intake/Output from previous day: 11/06 0701 - 11/07 0700 In: 1200 [P.O.:1200] Out: 1376 [Urine:1375; Stool:1] Intake/Output from this shift: Total I/O In: 360 [P.O.:360] Out: 675 [Urine:675]  Physical Exam: Neck: JVD - 8 cm above sternal notch, no adenopathy, no carotid bruit and supple, symmetrical, trachea midline Lungs: decreased breath sounds at bases with faint rales Heart: regularly irregular rhythm, S1, S2 normal and 2/6 systolic murmur and soft S3 gallop noted Abdomen: soft, non-tender; bowel sounds normal; no masses,  no organomegaly Extremities: no clubbing, cyanosis, 1+ edema noted  Lab Results:  Recent Labs  10/01/15 1853  WBC 5.6  HGB 14.7  PLT 172    Recent Labs  10/02/15 0350 10/03/15 0440  NA 133* 132*  K 3.6 3.8  CL 91* 88*  CO2 30 30  GLUCOSE 133* 152*  BUN 48* 49*  CREATININE 2.23* 2.13*    Recent Labs  10/02/15 0352 10/02/15 1031  TROPONINI 0.06* 0.10*   Hepatic Function Panel No results for input(s): PROT, ALBUMIN, AST, ALT, ALKPHOS, BILITOT, BILIDIR, IBILI in the last 72 hours. No results for input(s): CHOL in the last 72 hours. No results for input(s): PROTIME in the last 72 hours.  Imaging: Imaging results have been reviewed and Dg Chest 2 View  10/01/2015  CLINICAL DATA:  Increased shortness of breath and swelling. EXAM: CHEST  2 VIEW COMPARISON:  07/28/2015 FINDINGS: Mild bilateral interstitial prominence. There is  no focal parenchymal opacity. There is no pleural effusion or pneumothorax. There is stable cardiomegaly. There is a dual lead cardiac pacemaker. The osseous structures are unremarkable. IMPRESSION: Cardiomegaly with mild pulmonary vascular congestion. Electronically Signed   By: Elige Ko   On: 10/01/2015 19:11    Cardiac Studies:  Assessment/Plan:  Acute on chronic decompensated systolic heart failure Severe nonischemic dilated myopathy Coumadin toxicity Hypertension History of paroxysmal nonsustained VT and A. 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 Check labs in a.m.  LOS: 2 days    Rinaldo Cloud 10/03/2015, 2:00 PM

## 2015-10-03 NOTE — Progress Notes (Signed)
CRITICAL VALUE ALERT  Critical value received:  INR 7.81  Date of notification:  10/03/15  Time of notification:  0635  Critical value read back:Yes.    Nurse who received alert:  Merry Proud, RN  MD notified (1st page):  Dr. Algie Coffer  Time of first page:  [phone]  MD notified (2nd page):  Time of second page:  Responding MD:  Dr. Algie Coffer  Time MD responded:  [phone] Vitamin K ordered.

## 2015-10-03 NOTE — Progress Notes (Signed)
Pharmacist Heart Failure Core Measure Documentation  Assessment: Parker Johnson has an EF documented as 10% on 03/10/14 by ECHO.  Rationale: Heart failure patients with left ventricular systolic dysfunction (LVSD) and an EF < 40% should be prescribed an angiotensin converting enzyme inhibitor (ACEI) or angiotensin receptor blocker (ARB) at discharge unless a contraindication is documented in the medical record.  This patient is not currently on an ACEI or ARB for HF.  This note is being placed in the record in order to provide documentation that a contraindication to the use of these agents is present for this encounter.  ACE Inhibitor or Angiotensin Receptor Blocker is contraindicated (specify all that apply)  []   ACEI allergy AND ARB allergy []   Angioedema []   Moderate or severe aortic stenosis []   Hyperkalemia []   Hypotension []   Renal artery stenosis [x]   Worsening renal function, preexisting renal disease or dysfunction   Tad Moore, BCPS  Clinical Pharmacist Pager [phone]  10/03/2015 2:46 PM

## 2015-10-04 LAB — PROTIME-INR
INR: 3.14 — AB (ref 0.00–1.49)
Prothrombin Time: 31.7 seconds — ABNORMAL HIGH (ref 11.6–15.2)

## 2015-10-04 LAB — BASIC METABOLIC PANEL
ANION GAP: 15 (ref 5–15)
BUN: 51 mg/dL — ABNORMAL HIGH (ref 6–20)
CHLORIDE: 89 mmol/L — AB (ref 101–111)
CO2: 30 mmol/L (ref 22–32)
Calcium: 9.6 mg/dL (ref 8.9–10.3)
Creatinine, Ser: 1.9 mg/dL — ABNORMAL HIGH (ref 0.61–1.24)
GFR calc non Af Amer: 40 mL/min — ABNORMAL LOW (ref >60)
GFR, EST AFRICAN AMERICAN: 47 mL/min — AB (ref >60)
GLUCOSE: 114 mg/dL — AB (ref 65–99)
POTASSIUM: 3.8 mmol/L (ref 3.5–5.1)
Sodium: 134 mmol/L — ABNORMAL LOW (ref 135–145)

## 2015-10-04 LAB — CBC
HEMATOCRIT: 42.5 % (ref 39.0–52.0)
HEMOGLOBIN: 14.4 g/dL (ref 13.0–17.0)
MCH: 29.7 pg (ref 26.0–34.0)
MCHC: 33.9 g/dL (ref 30.0–36.0)
MCV: 87.6 fL (ref 78.0–100.0)
Platelets: 138 10*3/uL — ABNORMAL LOW (ref 150–400)
RBC: 4.85 MIL/uL (ref 4.22–5.81)
RDW: 18.3 % — ABNORMAL HIGH (ref 11.5–15.5)
WBC: 5.9 10*3/uL (ref 4.0–10.5)

## 2015-10-04 LAB — GLUCOSE, CAPILLARY
GLUCOSE-CAPILLARY: 124 mg/dL — AB (ref 65–99)
Glucose-Capillary: 189 mg/dL — ABNORMAL HIGH (ref 65–99)
Glucose-Capillary: 132 mg/dL — ABNORMAL HIGH (ref 65–99)
Glucose-Capillary: 119 mg/dL — ABNORMAL HIGH (ref 65–99)

## 2015-10-04 LAB — BRAIN NATRIURETIC PEPTIDE: B NATRIURETIC PEPTIDE 5: 3991.6 pg/mL — AB (ref 0.0–100.0)

## 2015-10-04 MED ORDER — WARFARIN - PHARMACIST DOSING INPATIENT
Freq: Every day | Status: DC
  Administered 2015-10-04: 19:00:00

## 2015-10-04 MED ORDER — WARFARIN SODIUM 5 MG PO TABS
5.0000 mg | ORAL_TABLET | Freq: Once | ORAL | Status: AC
  Administered 2015-10-04: 5 mg via ORAL
  Filled 2015-10-04: qty 1

## 2015-10-04 MED ORDER — FUROSEMIDE 10 MG/ML IJ SOLN
80.0000 mg | Freq: Three times a day (TID) | INTRAMUSCULAR | Status: DC
  Administered 2015-10-04 – 2015-10-05 (×3): 80 mg via INTRAVENOUS
  Filled 2015-10-04 (×3): qty 8

## 2015-10-04 NOTE — Progress Notes (Signed)
Subjective:  Denies any chest pain states breathing is slowly improving.  Objective:  Vital Signs in the last 24 hours: Temp:  [97.6 F (36.4 C)] 97.6 F (36.4 C) (11/08 0533) Pulse Rate:  [70-79] 70 (11/08 0533) Resp:  [32] 32 (11/07 1401) BP: (95-109)/(52-76) 105/76 mmHg (11/08 0815) SpO2:  [96 %-100 %] 100 % (11/08 0533) Weight:  [94.666 kg (208 lb 11.2 oz)] 94.666 kg (208 lb 11.2 oz) (11/08 0533)  Intake/Output from previous day: 11/07 0701 - 11/08 0700 In: 960 [P.O.:960] Out: 1750 [Urine:1750] Intake/Output from this shift: Total I/O In: 220 [P.O.:220] Out: -   Physical Exam: Neck: no adenopathy, no carotid bruit, no JVD and supple, symmetrical, trachea midline Lungs: Decreased breath sound at bases with faint rales Heart: regular rate and rhythm, S1, S2 normal and 2/6 systolic murmur and soft S3 gallop noted Abdomen: soft, non-tender; bowel sounds normal; no masses,  no organomegaly Extremities: No clubbing cyanosis 1+ edema noted  Lab Results:  Recent Labs  10/01/15 1853 10/04/15 0258  WBC 5.6 5.9  HGB 14.7 14.4  PLT 172 138*    Recent Labs  10/03/15 0440 10/04/15 0258  NA 132* 134*  K 3.8 3.8  CL 88* 89*  CO2 30 30  GLUCOSE 152* 114*  BUN 49* 51*  CREATININE 2.13* 1.90*    Recent Labs  10/02/15 0352 10/02/15 1031  TROPONINI 0.06* 0.10*   Hepatic Function Panel No results for input(s): PROT, ALBUMIN, AST, ALT, ALKPHOS, BILITOT, BILIDIR, IBILI in the last 72 hours. No results for input(s): CHOL in the last 72 hours. No results for input(s): PROTIME in the last 72 hours.  Imaging: Imaging results have been reviewed and No results found.  Cardiac Studies:  Assessment/Plan:  Acute on chronic decompensated systolic heart failure Severe nonischemic dilated myopathy Status post Coumadin toxicity Hypertension History of paroxysmal nonsustained VT and A. 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 Restrict fluid to 1 L per 24 hours  LOS: 3 days    Rinaldo Cloud 10/04/2015, 9:16 AM

## 2015-10-04 NOTE — Care Management Note (Addendum)
Case Management Note  Patient Details  Name: Parker Johnson MRN: [id number] Date of Birth: 1966/06/15  Subjective/Objective:  Pt admitted for Acute CHF. Pt is affiliated with [facility]. Liaison did come by hospital for visit and spoke with pt in reference to disposition needs.                   Action/Plan: CM did call Heart Failure Navigator in ref to pt needed a scale for home. Liaison to bring a scale by pt's room before d/c. No further needs identified by CM at this time.    Expected Discharge Date:                  Expected Discharge Plan:  Home/Self Care  In-House Referral:  NA  Discharge planning Services  CM Consult  Post Acute Care Choice:  NA Choice offered to:  NA  DME Arranged:  N/A DME Agency:  NA  HH Arranged:  NA HH Agency:  NA  Status of Service:  Completed, signed off  Medicare Important Message Given:    Date Medicare IM Given:    Medicare IM give by:    Date Additional Medicare IM Given:    Additional Medicare Important Message give by:     If discussed at Long Length of Stay Meetings, dates discussed:    Additional Comments:  Gala Lewandowsky, RN 10/04/2015, 2:39 PM

## 2015-10-04 NOTE — Progress Notes (Signed)
Heart Failure Navigator Consult Note  Presentation: Parker Johnson is a 49 year old male with past medical history significant for multiple medical problems i.e. 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 Patient denies any noncompliance to medication but states has been eating outside food and drinking extra fluids lately.  He denies any cough, fever chills, chest pain, nausea, vomiting, diaphoresis or any ICD discharges. Patient was noted to have elevated BNP above 4000.   Past Medical History  Diagnosis Date  . Ventricular tachycardia (HCC)   . AICD (automatic cardioverter/defibrillator) present     [employer]  . Tobacco abuse   . Alcohol abuse     hx  . Cardiomyopathy     secondary  . HTN (hypertension)     uncontrolled  . CHF (congestive heart failure) (HCC)   . Gout     Social History   Social History  . Marital Status: Married    Spouse Name: N/A  . Number of Children: N/A  . Years of Education: N/A   Social History Main Topics  . Smoking status: Current Every Day Smoker -- 0.50 packs/day for 30 years    Types: Cigarettes  . Smokeless tobacco: Never Used     Comment: I already have information on quitting"  . Alcohol Use: 0.6 oz/week    1 Cans of beer per week  . Drug Use: No     Comment: history of cocaine use that is not very recent (1-2 yrs)  . Sexual  Activity: Yes    Birth Control/ Protection: None   Other Topics Concern  . None   Social History Narrative   Disabled. Did not finish the 12th grade but is interested in pursuing a GED.     ECHO:Study Conclusions--03/11/15  - Left ventricle: The cavity size was moderately dilated. The estimated ejection fraction was 10%. Wall motion was normal; there were no regional wall motion abnormalities. - Left atrium: The atrium was mildly dilated. - Atrial septum: No defect or patent foramen ovale was identified. - Pulmonary arteries: PA peak pressure: 65mm Hg (S).  ------------------------------------------------------------ Labs, prior tests, procedures, and surgery: Permanent pacemaker system implantation.  Transthoracic echocardiography. M-mode, complete 2D, spectral Doppler, and color Doppler. Height: Height: 170.2cm. Height: 67in. Weight: Weight: 84.2kg. Weight: 185.2lb. Body mass index: BMI: 29.1kg/m^2. Body surface area:  BSA: 2.52m^2. Blood pressure:   115/88. Patient status: Inpatient. Location: Bedside. BNP    Component Value Date/Time   BNP 3991.6* 10/04/2015 0258    ProBNP    Component Value Date/Time   PROBNP 3624.0* 03/13/2014 0330     Education Assessment and Provision:  Detailed education and instructions provided on heart failure disease management including the following:  Signs and symptoms of Heart Failure When to call the physician Importance of daily weights Low sodium diet Fluid restriction Medication management Anticipated future follow-up appointments  Patient education given on each of the above topics.  Patient acknowledges understanding and acceptance of all instructions.  I spoke briefly  with Mr. Hirota regarding his HF.  He tells me that he did not have a working scale and I have provided one for home use.  I reviewed the importance of daily weights and how they relate to the signs and symptoms of HF.  He admits to  eating higher sodium foods such as canned soups lately.  I reinforced a low sodium diet and high sodium foods to avoid.  He denies any issues with getting or taking prescribed medications.  He lives in [place] with a roommate.  He will follow-up with Dr Sharyn Lull as outpatient.  Education Materials:  "Living Better With Heart Failure" Booklet, Daily Weight Tracker Tool   High Risk Criteria for Readmission and/or Poor Patient Outcomes:   EF <30%- yes 10%  2 or more admissions in 6 months-2/6  Difficult social situation- No  Demonstrates medication noncompliance- No denies    Barriers of Care:  Knowledge and compliance  Discharge Planning:   Plans to return to home with roommate.

## 2015-10-04 NOTE — Progress Notes (Signed)
ANTICOAGULATION CONSULT NOTE  Pharmacy Consult for coumadin  Indication: atrial fibrillation  No Known Allergies  Patient Measurements: Height: 5\' 7"  (170.2 cm) Weight: 208 lb 11.2 oz (94.666 kg) IBW/kg (Calculated) : 66.1  Vital Signs: Temp: 97.6 F (36.4 C) (11/08 0533) Temp Source: Oral (11/08 0533) BP: 105/76 mmHg (11/08 0815) Pulse Rate: 70 (11/08 0533)  Labs:  Recent Labs  10/01/15 1853 10/01/15 2303 10/02/15 0350 10/02/15 0352 10/02/15 1031 10/03/15 0440 10/04/15 0258  HGB 14.7  --   --   --   --   --  14.4  HCT 43.2  --   --   --   --   --  42.5  PLT 172  --   --   --   --   --  138*  LABPROT 45.8*  --   --   --  53.1* 62.9* 31.7*  INR 5.13*  --   --   --  >5.00* 7.81* 3.14*  CREATININE 2.39*  --  2.23*  --   --  2.13* 1.90*  TROPONINI  --  0.05*  --  0.06* 0.10*  --   --     Estimated Creatinine Clearance: 52.1 mL/min (by C-G formula based on Cr of 1.9).   Medications:  Prescriptions prior to admission  Medication Sig Dispense Refill Last Dose  . allopurinol (ZYLOPRIM) 100 MG tablet Take 100 mg by mouth daily.   10/01/2015 at Unknown time  . carvedilol (COREG) 6.25 MG tablet Take 1 tablet (6.25 mg total) by mouth 2 (two) times daily with a meal. 60 tablet 3 10/01/2015 at 1000  . digoxin (LANOXIN) 0.25 MG tablet Take 0.25 mg by mouth daily.   10/01/2015 at Unknown time  . Multiple Vitamin (MULTIVITAMIN WITH MINERALS) TABS tablet Take 1 tablet by mouth daily.   10/01/2015 at Unknown time  . spironolactone (ALDACTONE) 25 MG tablet Take 25 mg by mouth daily.     10/01/2015 at Unknown time  . torsemide (DEMADEX) 20 MG tablet Take 40 mg by mouth 2 (two) times daily.   10/01/2015 at Unknown time  . warfarin (COUMADIN) 10 MG tablet Take 10 mg by mouth daily.   09/30/2015 at Unknown time   Scheduled:  . allopurinol  100 mg Oral Daily  . aspirin EC  81 mg Oral Daily  . carvedilol  6.25 mg Oral BID WC  . digoxin  0.25 mg Oral Daily  . furosemide  80 mg Intravenous 3  times per day  . insulin aspart  0-15 Units Subcutaneous TID WC  . multivitamin with minerals  1 tablet Oral Daily  . sodium chloride  3 mL Intravenous Q12H  . spironolactone  25 mg Oral Daily    Assessment: 49 yo male here with decompensated HF (EF= 10%) with history of PAF on coumadin PTA. Coumadin has been on hold due to elevated INR. He is noted s/p 1mg  IV vitamin K on 10/03/15 and INR is 3.14. Pharmacy has been consulted to dose coumadin -INR trend  (5.13>>7.81>>3.14), Hg= 14.4, plt= 138  Home dose: 10mg  daily (last dose 09/30/15)   Goal of Therapy:  INR 2-3 Monitor platelets by anticoagulation protocol: Yes   Plan:  -Coumadin 5mg  po today (anticipate further INR trend down) -Daily PT/INR  Harland German, Pharm D 10/04/2015 9:38 AM

## 2015-10-05 LAB — BASIC METABOLIC PANEL
ANION GAP: 9 (ref 5–15)
BUN: 46 mg/dL — ABNORMAL HIGH (ref 6–20)
CHLORIDE: 92 mmol/L — AB (ref 101–111)
CO2: 31 mmol/L (ref 22–32)
Calcium: 8.7 mg/dL — ABNORMAL LOW (ref 8.9–10.3)
Creatinine, Ser: 1.44 mg/dL — ABNORMAL HIGH (ref 0.61–1.24)
GFR calc non Af Amer: 56 mL/min — ABNORMAL LOW (ref >60)
Glucose, Bld: 168 mg/dL — ABNORMAL HIGH (ref 65–99)
POTASSIUM: 3.3 mmol/L — AB (ref 3.5–5.1)
SODIUM: 132 mmol/L — AB (ref 135–145)

## 2015-10-05 LAB — GLUCOSE, CAPILLARY
GLUCOSE-CAPILLARY: 144 mg/dL — AB (ref 65–99)
GLUCOSE-CAPILLARY: 160 mg/dL — AB (ref 65–99)
GLUCOSE-CAPILLARY: 94 mg/dL (ref 65–99)

## 2015-10-05 LAB — PROTIME-INR
INR: 7.81 (ref 0.00–1.49)
INR: 2.68 — AB (ref 0.00–1.49)
PROTHROMBIN TIME: 28.1 s — AB (ref 11.6–15.2)
Prothrombin Time: 62.9 seconds — ABNORMAL HIGH (ref 11.6–15.2)

## 2015-10-05 LAB — BRAIN NATRIURETIC PEPTIDE: B NATRIURETIC PEPTIDE 5: 3970.6 pg/mL — AB (ref 0.0–100.0)

## 2015-10-05 MED ORDER — WARFARIN SODIUM 7.5 MG PO TABS
7.5000 mg | ORAL_TABLET | Freq: Once | ORAL | Status: AC
  Administered 2015-10-05: 7.5 mg via ORAL
  Filled 2015-10-05: qty 1

## 2015-10-05 MED ORDER — TORSEMIDE 100 MG PO TABS: 50.0000 mg | ORAL_TABLET | Freq: Two times a day (BID) | ORAL | Status: DC

## 2015-10-05 MED ORDER — CETYLPYRIDINIUM CHLORIDE 0.05 % MT LIQD: 7.0000 mL | Freq: Two times a day (BID) | OROMUCOSAL | Status: DC

## 2015-10-05 MED ORDER — LISINOPRIL 10 MG PO TABS: 10.0000 mg | ORAL_TABLET | Freq: Every day | ORAL | Status: DC

## 2015-10-05 NOTE — Progress Notes (Signed)
ANTICOAGULATION CONSULT NOTE  Pharmacy Consult for coumadin  Indication: atrial fibrillation  No Known Allergies  Patient Measurements: Height: 5\' 7"  (170.2 cm) Weight: 210 lb 4.8 oz (95.391 kg) IBW/kg (Calculated) : 66.1  Vital Signs: Temp: 97.7 F (36.5 C) (11/09 0506) Temp Source: Oral (11/09 0506) BP: 101/83 mmHg (11/09 0506) Pulse Rate: 70 (11/09 0506)  Labs:  Recent Labs  10/02/15 1031 10/03/15 0440 10/04/15 0258 10/05/15 0510  HGB  --   --  14.4  --   HCT  --   --  42.5  --   PLT  --   --  138*  --   LABPROT 53.1* 62.9* 31.7* 28.1*  INR >5.00* 7.81* 3.14* 2.68*  CREATININE  --  2.13* 1.90* 1.44*  TROPONINI 0.10*  --   --   --     Estimated Creatinine Clearance: 69 mL/min (by C-G formula based on Cr of 1.44).   Medications:  Prescriptions prior to admission  Medication Sig Dispense Refill Last Dose  . allopurinol (ZYLOPRIM) 100 MG tablet Take 100 mg by mouth daily.   10/01/2015 at Unknown time  . carvedilol (COREG) 6.25 MG tablet Take 1 tablet (6.25 mg total) by mouth 2 (two) times daily with a meal. 60 tablet 3 10/01/2015 at 1000  . digoxin (LANOXIN) 0.25 MG tablet Take 0.25 mg by mouth daily.   10/01/2015 at Unknown time  . Multiple Vitamin (MULTIVITAMIN WITH MINERALS) TABS tablet Take 1 tablet by mouth daily.   10/01/2015 at Unknown time  . spironolactone (ALDACTONE) 25 MG tablet Take 25 mg by mouth daily.     10/01/2015 at Unknown time  . torsemide (DEMADEX) 20 MG tablet Take 40 mg by mouth 2 (two) times daily.   10/01/2015 at Unknown time  . warfarin (COUMADIN) 10 MG tablet Take 10 mg by mouth daily.   09/30/2015 at Unknown time   Scheduled:  . allopurinol  100 mg Oral Daily  . antiseptic oral rinse  7 mL Mouth Rinse BID  . aspirin EC  81 mg Oral Daily  . carvedilol  6.25 mg Oral BID WC  . digoxin  0.25 mg Oral Daily  . furosemide  80 mg Intravenous 3 times per day  . insulin aspart  0-15 Units Subcutaneous TID WC  . multivitamin with minerals  1 tablet  Oral Daily  . sodium chloride  3 mL Intravenous Q12H  . spironolactone  25 mg Oral Daily  . Warfarin - Pharmacist Dosing Inpatient   Does not apply q1800    Assessment: 49 yo male here with decompensated HF (EF= 10%) with history of PAF on coumadin PTA. Coumadin has been on hold due to elevated INR. He is noted s/p 1mg  IV vitamin K on 10/03/15 and INR is 2.68 w/ trend down. Pharmacy has been consulted to dose coumadin and was restarted on 11/8 -INR trend  (5.13>>7.81>>3.14>>2.68), Hg= 14.4, plt= 138  Home dose: 10mg  daily (last dose 09/30/15)   Goal of Therapy:  INR 2-3 Monitor platelets by anticoagulation protocol: Yes   Plan:  -Coumadin 7.5mg  po today -Daily PT/INR  Harland German, Pharm D 10/05/2015 7:59 AM

## 2015-10-05 NOTE — Discharge Summary (Signed)
Discharge summary dictated on 10/05/2015 dictation number is [phone]

## 2015-10-05 NOTE — Discharge Instructions (Signed)

## 2015-10-06 NOTE — Discharge Summary (Signed)
NAME:  Parker Johnson, Parker Johnson NO.:  [id number]  MEDICAL RECORD NO.:  [id number]  LOCATION:  3W04C                        FACILITY:  [facility]  PHYSICIAN:  Eduardo Osier. Sharyn Lull, M.D. DATE OF BIRTH:  07/28/1966  DATE OF ADMISSION:  10/01/2015 DATE OF DISCHARGE:  10/05/2015                              DISCHARGE SUMMARY   ADMITTING DIAGNOSES: 1. Acute on chronic decompensated systolic heart failure. 2. Severe nonischemic dilated cardiomyopathy. 3. Coumadin toxicity. 4. Hypertension. 5. History of paroxysmal nonsustained ventricular tachycardia and     atrial fibrillation in the past. 6. History of cardiogenic non-Q-wave myocardial infarction in the     past. 7. History of cardioemboli to right arm status post thrombectomy in     July 2014. 8. History of polysubstance abuse/alcohol abuse in the past. 9. History of gouty arthritis. 10.Chronic kidney disease, stage 3.  DISCHARGE DIAGNOSES: 1. Resolving decompensated chronic congestive heart failure. 2. Severe nonischemic dilated cardiomyopathy. 3. Status post Coumadin toxicity. 4. Hypertension. 5. History of paroxysmal nonsustained ventricular tachycardia and     atrial fibrillation in the past. 6. History of cardioembolic non-Q-wave myocardial infarction in the     past. 7. History of cardioemboli to the right arm status post thrombectomy     in July 2014. 8. History of polysubstance abuse. 9. History of alcohol abuse in the past. 10.History of gouty arthritis. 11.Chronic kidney disease, stage 3.  DISCHARGE HOME MEDICATIONS: 1. Lisinopril 10 mg 1 tablet daily. 2. Allopurinol 100 mg daily. 3. Carvedilol 6.25 mg 1 tablet twice daily. 4. Digoxin 0.25 mg daily. 5. Multivitamin with minerals 1 tablet daily. 6. Aldactone 25 mg daily. 7. Warfarin 10 mg daily as before. 8. Torsemide 50 mg twice daily.  DIET:  Low salt, low cholesterol.  ACTIVITY:  As tolerated.  DISCHARGE INSTRUCTIONS:  Heart failure instructions have  been given. The patient has been advised to get PT, INR, and basic metabolic panel early next week.  CONDITION AT DISCHARGE:  Stable.  FOLLOWUP:  Follow up with me in 1 week.  Heart failure instructions have been given.  BRIEF HISTORY AND HOSPITAL COURSE:  Mr. 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  He came to the ER complaining of progressive increasing shortness of breath associated with leg swelling for 1 week.  The patient denies any noncompliance to medication, but states has been eating outside food and drinking extra fluids lately. He denies any cough, fever, chills, chest pain, nausea, vomiting, diaphoresis, or any ICD discharge.  The patient was noted to have elevated BNP above 4000.  PHYSICAL EXAMINATION:  GENERAL:  He was alert, awake, oriented x3. VITAL SIGNS:  Blood pressure was 104/63, pulse 91, he was  afebrile. HEENT:  Conjunctivae were pink. NECK:  Supple.  Positive JVD. LUNGS:  Regular rate and rhythm.  There was soft systolic murmur and S3 gallop. ABDOMEN:  Soft.  Bowel sounds were present.  Nontender. EXTREMITIES:  There is no clubbing, cyanosis.  2+ edema was noted. NEUROLOGIC:  Grossly intact.  LABORATORY DATA:  Sodium was 133, potassium 4.2, BUN 48, creatinine 2.39, BNP was 4016.  Hemoglobin was 14.7, hematocrit 43.2, white count of 5.6.  His PT was 45.8, INR was 5.13.  Repeat  electrolytes today; sodium is 132, potassium 3.3, BUN 46, creatinine 1.44, it has come down; hemoglobin was 14.4, hematocrit 42.5, white count of 5.9.  Today, his PT is 28.1, INR is 2.68.  BRIEF HOSPITAL COURSE:  The patient was admitted to telemetry unit, was started on IV Lasix with good diuresis.  His ACE inhibitors were held with improvement in his renal function.  The patient received 1 dose of vitamin K with normalization of his PT/INR.  The patient did not have any episodes of bleeding during the hospital stay.  The patient is eager to go home and will be discharged home on above medications and will be followed up in my office in 1 week.  The patient has been advised to check BNP and PT/INR early next week.  Also the patient has been given instructions for heart failure and advised to check weight every day.     Eduardo Osier. Sharyn Lull, M.D.     [facility]  D:  10/05/2015  T:  10/06/2015  Job:  [id number]  cc:   Eduardo Osier. Sharyn Lull, M.D.

## 2015-11-08 ENCOUNTER — Ambulatory Visit (INDEPENDENT_AMBULATORY_CARE_PROVIDER_SITE_OTHER): Payer: Medicaid Other | Admitting: *Deleted

## 2015-11-08 DIAGNOSIS — I429 Cardiomyopathy, unspecified: Secondary | ICD-10-CM

## 2015-11-08 DIAGNOSIS — I428 Other cardiomyopathies: Secondary | ICD-10-CM

## 2015-11-09 NOTE — Progress Notes (Signed)
Remote ICD transmission.   

## 2015-11-12 LAB — CUP PACEART REMOTE DEVICE CHECK
HighPow Impedance: 51 Ohm
Implantable Lead Implant Date: 20100923
Implantable Lead Location: 753860
Implantable Lead Model: 158
Implantable Lead Serial Number: 183379
Lead Channel Impedance Value: 486 Ohm
Lead Channel Sensing Intrinsic Amplitude: 16.6 mV
Lead Channel Sensing Intrinsic Amplitude: 2.9 mV
MDC IDC LEAD IMPLANT DT: 20100923
MDC IDC LEAD LOCATION: 753859
MDC IDC MSMT BATTERY REMAINING LONGEVITY: 60 mo
MDC IDC MSMT LEADCHNL RA IMPEDANCE VALUE: 463 Ohm
MDC IDC PG SERIAL: 143406
MDC IDC SESS DTM: 20161217123540
MDC IDC STAT BRADY RA PERCENT PACED: 0 %
MDC IDC STAT BRADY RV PERCENT PACED: 0 %

## 2015-11-17 ENCOUNTER — Encounter: Payer: Self-pay | Admitting: *Deleted

## 2015-11-22 ENCOUNTER — Telehealth: Payer: Self-pay | Admitting: *Deleted

## 2015-11-22 NOTE — Telephone Encounter (Signed)
Pt called regarding VF episode requiring 41J shock 11/19/15. Pt denies missing any doses of medication. Pt denies symptoms now or at the time of the episode. Made aware of driving restrictions x 6 months. Will review with Dr. Johney Frame and call pt back with any recommendations. Pt verbalizes understanding.

## 2015-11-23 NOTE — Telephone Encounter (Signed)
Reviewed with SK- advised to see in office 11/25/15 at 3:30pm. Pt agreeable.

## 2015-11-25 ENCOUNTER — Encounter: Payer: Medicaid Other | Admitting: Internal Medicine

## 2015-11-29 ENCOUNTER — Encounter: Payer: Self-pay | Admitting: Internal Medicine

## 2015-12-28 ENCOUNTER — Emergency Department (HOSPITAL_COMMUNITY): Payer: Medicaid Other

## 2015-12-28 ENCOUNTER — Encounter (HOSPITAL_COMMUNITY): Payer: Self-pay | Admitting: Emergency Medicine

## 2015-12-28 ENCOUNTER — Inpatient Hospital Stay (HOSPITAL_COMMUNITY)
Admission: EM | Admit: 2015-12-28 | Discharge: 2016-01-01 | DRG: 291 | Disposition: A | Discharge status: Home / Self Care | Payer: Medicaid Other | Attending: Cardiology | Admitting: Cardiology

## 2015-12-28 DIAGNOSIS — E876 Hypokalemia: Secondary | ICD-10-CM | POA: Diagnosis present

## 2015-12-28 DIAGNOSIS — I5023 Acute on chronic systolic (congestive) heart failure: Secondary | ICD-10-CM

## 2015-12-28 DIAGNOSIS — F17210 Nicotine dependence, cigarettes, uncomplicated: Secondary | ICD-10-CM | POA: Diagnosis present

## 2015-12-28 DIAGNOSIS — I130 Hypertensive heart and chronic kidney disease with heart failure and stage 1 through stage 4 chronic kidney disease, or unspecified chronic kidney disease: Principal | ICD-10-CM | POA: Diagnosis present

## 2015-12-28 DIAGNOSIS — I252 Old myocardial infarction: Secondary | ICD-10-CM

## 2015-12-28 DIAGNOSIS — Z7901 Long term (current) use of anticoagulants: Secondary | ICD-10-CM

## 2015-12-28 DIAGNOSIS — N183 Chronic kidney disease, stage 3 (moderate): Secondary | ICD-10-CM | POA: Diagnosis present

## 2015-12-28 DIAGNOSIS — I420 Dilated cardiomyopathy: Secondary | ICD-10-CM | POA: Diagnosis present

## 2015-12-28 DIAGNOSIS — R609 Edema, unspecified: Secondary | ICD-10-CM

## 2015-12-28 DIAGNOSIS — M1000 Idiopathic gout, unspecified site: Secondary | ICD-10-CM | POA: Diagnosis present

## 2015-12-28 DIAGNOSIS — Z95810 Presence of automatic (implantable) cardiac defibrillator: Secondary | ICD-10-CM

## 2015-12-28 DIAGNOSIS — R0602 Shortness of breath: Secondary | ICD-10-CM | POA: Diagnosis not present

## 2015-12-28 DIAGNOSIS — N289 Disorder of kidney and ureter, unspecified: Secondary | ICD-10-CM

## 2015-12-28 LAB — BASIC METABOLIC PANEL
ANION GAP: 14 (ref 5–15)
BUN: 26 mg/dL — ABNORMAL HIGH (ref 6–20)
CO2: 32 mmol/L (ref 22–32)
Calcium: 9.1 mg/dL (ref 8.9–10.3)
Chloride: 90 mmol/L — ABNORMAL LOW (ref 101–111)
Creatinine, Ser: 1.48 mg/dL — ABNORMAL HIGH (ref 0.61–1.24)
GFR calc Af Amer: >60 mL/min (ref >60)
GFR calc non Af Amer: 54 mL/min — ABNORMAL LOW (ref >60)
GLUCOSE: 191 mg/dL — AB (ref 65–99)
POTASSIUM: 3.3 mmol/L — AB (ref 3.5–5.1)
Sodium: 136 mmol/L (ref 135–145)

## 2015-12-28 LAB — CBC
HEMATOCRIT: 43.9 % (ref 39.0–52.0)
HEMOGLOBIN: 14.7 g/dL (ref 13.0–17.0)
MCH: 29.1 pg (ref 26.0–34.0)
MCHC: 33.5 g/dL (ref 30.0–36.0)
MCV: 86.8 fL (ref 78.0–100.0)
Platelets: 222 10*3/uL (ref 150–400)
RBC: 5.06 MIL/uL (ref 4.22–5.81)
RDW: 21.3 % — ABNORMAL HIGH (ref 11.5–15.5)
WBC: 5.4 10*3/uL (ref 4.0–10.5)

## 2015-12-28 LAB — I-STAT TROPONIN, ED: Troponin i, poc: 0.04 ng/mL (ref 0.00–0.08)

## 2015-12-28 LAB — PROTIME-INR
INR: 1.88 — AB (ref 0.00–1.49)
Prothrombin Time: 21.5 seconds — ABNORMAL HIGH (ref 11.6–15.2)

## 2015-12-28 LAB — BRAIN NATRIURETIC PEPTIDE: B Natriuretic Peptide: 3316.9 pg/mL — ABNORMAL HIGH (ref 0.0–100.0)

## 2015-12-28 MED ORDER — WARFARIN - PHARMACIST DOSING INPATIENT
Freq: Every day | Status: DC
  Administered 2015-12-29: 18:00:00

## 2015-12-28 MED ORDER — MILRINONE IN DEXTROSE 20 MG/100ML IV SOLN
0.1250 ug/kg/min | INTRAVENOUS | Status: DC
  Administered 2015-12-29 – 2015-12-31 (×6): 0.375 ug/kg/min via INTRAVENOUS
  Administered 2015-12-31: 0.25 ug/kg/min via INTRAVENOUS
  Filled 2015-12-28 (×7): qty 100

## 2015-12-28 MED ORDER — SODIUM CHLORIDE 0.9% FLUSH: 3.0000 mL | INTRAVENOUS | Status: DC | PRN

## 2015-12-28 MED ORDER — SODIUM CHLORIDE 0.9 % IV SOLN: 250.0000 mL | INTRAVENOUS | Status: DC | PRN

## 2015-12-28 MED ORDER — ONDANSETRON HCL 4 MG/2ML IJ SOLN: 4.0000 mg | Freq: Four times a day (QID) | INTRAMUSCULAR | Status: DC | PRN

## 2015-12-28 MED ORDER — WARFARIN SODIUM 10 MG PO TABS
10.0000 mg | ORAL_TABLET | Freq: Once | ORAL | Status: AC
  Administered 2015-12-29: 10 mg via ORAL
  Filled 2015-12-28: qty 1

## 2015-12-28 MED ORDER — ALLOPURINOL 100 MG PO TABS
100.0000 mg | ORAL_TABLET | Freq: Every day | ORAL | Status: DC
  Administered 2015-12-29 – 2016-01-01 (×4): 100 mg via ORAL
  Filled 2015-12-28 (×4): qty 1

## 2015-12-28 MED ORDER — POTASSIUM CHLORIDE CRYS ER 20 MEQ PO TBCR
20.0000 meq | EXTENDED_RELEASE_TABLET | Freq: Two times a day (BID) | ORAL | Status: DC
  Administered 2015-12-28 – 2015-12-31 (×6): 20 meq via ORAL
  Filled 2015-12-28 (×6): qty 1

## 2015-12-28 MED ORDER — DIGOXIN 250 MCG PO TABS
0.2500 mg | ORAL_TABLET | Freq: Every day | ORAL | Status: DC
  Administered 2015-12-29 – 2016-01-01 (×4): 0.25 mg via ORAL
  Filled 2015-12-28 (×4): qty 1

## 2015-12-28 MED ORDER — POTASSIUM CHLORIDE CRYS ER 20 MEQ PO TBCR
40.0000 meq | EXTENDED_RELEASE_TABLET | Freq: Once | ORAL | Status: AC
  Administered 2015-12-28: 40 meq via ORAL
  Filled 2015-12-28: qty 2

## 2015-12-28 MED ORDER — SODIUM CHLORIDE 0.9% FLUSH
3.0000 mL | Freq: Two times a day (BID) | INTRAVENOUS | Status: DC
  Administered 2015-12-28 – 2015-12-31 (×6): 3 mL via INTRAVENOUS

## 2015-12-28 MED ORDER — FUROSEMIDE 10 MG/ML IJ SOLN
80.0000 mg | Freq: Once | INTRAMUSCULAR | Status: AC
  Administered 2015-12-28: 80 mg via INTRAVENOUS
  Filled 2015-12-28: qty 8

## 2015-12-28 MED ORDER — PANTOPRAZOLE SODIUM 40 MG PO TBEC
40.0000 mg | DELAYED_RELEASE_TABLET | Freq: Every day | ORAL | Status: DC
  Administered 2015-12-30 – 2016-01-01 (×4): 40 mg via ORAL
  Filled 2015-12-28 (×5): qty 1

## 2015-12-28 MED ORDER — FUROSEMIDE 10 MG/ML IJ SOLN
60.0000 mg | Freq: Two times a day (BID) | INTRAMUSCULAR | Status: DC
  Administered 2015-12-29 – 2015-12-31 (×5): 60 mg via INTRAVENOUS
  Filled 2015-12-28 (×5): qty 6

## 2015-12-28 MED ORDER — CARVEDILOL 3.125 MG PO TABS: 3.1250 mg | ORAL_TABLET | Freq: Two times a day (BID) | ORAL | Status: DC

## 2015-12-28 MED ORDER — ACETAMINOPHEN 325 MG PO TABS
650.0000 mg | ORAL_TABLET | ORAL | Status: DC | PRN
  Administered 2015-12-31: 650 mg via ORAL
  Filled 2015-12-28: qty 2

## 2015-12-28 NOTE — ED Provider Notes (Signed)
CSN: [id number]     Arrival date & time 12/28/15  1513 History   First MD Initiated Contact with Patient 12/28/15 2103     Chief Complaint  Patient presents with  . Shortness of Breath  . Leg Swelling     (Consider location/radiation/quality/duration/timing/severity/associated sxs/prior Treatment) Patient is a 50 y.o. male presenting with shortness of breath. The history is provided by the patient.  Shortness of Breath He comes in with progressive leg swelling and dyspnea over the last 2 weeks. He states that he has increased his Demadex from half tablet twice a day to a full tablet twice a day and a has not been able to control his leg swelling. Dyspnea has been getting worst and has reached the point where he has to sleep sitting up. He denies chest pain, heaviness, tightness, pressure. He denies fever or chills. Also, he is on warfarin but has not had his INR checked in over a month.  Past Medical History  Diagnosis Date  . Ventricular tachycardia (HCC)   . AICD (automatic cardioverter/defibrillator) present     [employer]  . Tobacco abuse   . Alcohol abuse     hx  . Cardiomyopathy     secondary  . HTN (hypertension)     uncontrolled  . CHF (congestive heart failure) (HCC)   . Gout    Past Surgical History  Procedure Laterality Date  . Cardiac defibrillator placement  2010    [employer] 100  . Embolectomy Right 06/04/2013    Procedure: Upper Extremity Thrombectomy;  Surgeon: Nada Libman, MD;  Location: [facility] OR;  Service: Vascular;  Laterality: Right;   Family History  Problem Relation Age of Onset  . Cardiomyopathy Father     on a defib  . Diabetes Father    Social History  Substance Use Topics  . Smoking status: Current Every Day Smoker -- 0.50 packs/day for 30 years    Types: Cigarettes  . Smokeless tobacco: Never Used     Comment: I already have information on quitting"  . Alcohol Use: 0.6 oz/week    1 Cans of beer per week     Review of Systems  Respiratory: Positive for shortness of breath.   All other systems reviewed and are negative.     Allergies  Review of patient's allergies indicates no known allergies.  Home Medications   Prior to Admission medications   Medication Sig Start Date End Date Taking? Authorizing Provider  allopurinol (ZYLOPRIM) 100 MG tablet Take 100 mg by mouth daily.   Yes Historical Provider, MD  carvedilol (COREG) 6.25 MG tablet Take 1 tablet (6.25 mg total) by mouth 2 (two) times daily with a meal. 05/23/15  Yes Rinaldo Cloud, MD  digoxin (LANOXIN) 0.25 MG tablet Take 0.25 mg by mouth daily.   Yes Historical Provider, MD  lisinopril (ZESTRIL) 10 MG tablet Take 1 tablet (10 mg total) by mouth daily. 10/05/15  Yes Rinaldo Cloud, MD  spironolactone (ALDACTONE) 25 MG tablet Take 25 mg by mouth daily.     Yes Historical Provider, MD  torsemide (DEMADEX) 100 MG tablet Take 0.5 tablets (50 mg total) by mouth 2 (two) times daily. Patient taking differently: Take 50-100 mg by mouth 2 (two) times daily. Normal dosage is  twice daily, but sometimes takes extra  as needed-up to including  daily as neede-for fluid and edema 10/05/15  Yes Rinaldo Cloud, MD  warfarin (COUMADIN) 10 MG tablet Take 10 mg by mouth  daily. Either morning or lunch time dose   Yes Historical Provider, MD   BP 110/93 mmHg  Pulse 97  Temp(Src) 97.4 F (36.3 C) (Oral)  Resp 34  Wt 201 lb (91.173 kg)  SpO2 95% Physical Exam  Nursing note and vitals reviewed.  50 year old male, resting comfortably and in no acute distress. Vital signs are significant for tachypnea. Oxygen saturation is 95%, which is normal. Head is normocephalic and atraumatic. PERRLA, EOMI. Oropharynx is clear. Neck is nontender and supple without adenopathy or JVD. Back is nontender and there is no CVA tenderness. Lungs are clear without rales, wheezes, or rhonchi. Chest is nontender. Heart has regular rate and rhythm without  murmur. Abdomen is soft, flat, nontender without masses or hepatosplenomegaly and peristalsis is normoactive. Extremities have 3+ edema with 1+ presacral edema, full range of motion is present. Skin is warm and dry without rash. Neurologic: Mental status is normal, cranial nerves are intact, there are no motor or sensory deficits.  ED Course  Procedures (including critical care time) Labs Review Results for orders placed or performed during the hospital encounter of 12/28/15  Basic metabolic panel  Result Value Ref Range   Sodium 136 135 - 145 mmol/L   Potassium 3.3 (L) 3.5 - 5.1 mmol/L   Chloride 90 (L) 101 - 111 mmol/L   CO2 32 22 - 32 mmol/L   Glucose, Bld 191 (H) 65 - 99 mg/dL   BUN 26 (H) 6 - 20 mg/dL   Creatinine, Ser 1.61 (H) 0.61 - 1.24 mg/dL   Calcium 9.1 8.9 - 09.6 mg/dL   GFR calc non Af Amer 54 (L) >60 mL/min   GFR calc Af Amer >60 >60 mL/min   Anion gap 14 5 - 15  CBC  Result Value Ref Range   WBC 5.4 4.0 - 10.5 K/uL   RBC 5.06 4.22 - 5.81 MIL/uL   Hemoglobin 14.7 13.0 - 17.0 g/dL   HCT 04.5 40.9 - 81.1 %   MCV 86.8 78.0 - 100.0 fL   MCH 29.1 26.0 - 34.0 pg   MCHC 33.5 30.0 - 36.0 g/dL   RDW 91.4 (H) 78.2 - 95.6 %   Platelets 222 150 - 400 K/uL  Brain natriuretic peptide  Result Value Ref Range   B Natriuretic Peptide 3316.9 (H) 0.0 - 100.0 pg/mL  Protime-INR  Result Value Ref Range   Prothrombin Time 21.5 (H) 11.6 - 15.2 seconds   INR 1.88 (H) 0.00 - 1.49  CBC WITH DIFFERENTIAL  Result Value Ref Range   WBC 5.6 4.0 - 10.5 K/uL   RBC 5.00 4.22 - 5.81 MIL/uL   Hemoglobin 14.7 13.0 - 17.0 g/dL   HCT 21.3 08.6 - 57.8 %   MCV 86.0 78.0 - 100.0 fL   MCH 29.4 26.0 - 34.0 pg   MCHC 34.2 30.0 - 36.0 g/dL   RDW 46.9 (H) 62.9 - 52.8 %   Platelets 217 150 - 400 K/uL   Neutrophils Relative % PENDING %   Neutro Abs PENDING 1.7 - 7.7 K/uL   Band Neutrophils PENDING %   Lymphocytes Relative PENDING %   Lymphs Abs PENDING 0.7 - 4.0 K/uL   Monocytes Relative  PENDING %   Monocytes Absolute PENDING 0.1 - 1.0 K/uL   Eosinophils Relative PENDING %   Eosinophils Absolute PENDING 0.0 - 0.7 K/uL   Basophils Relative PENDING %   Basophils Absolute PENDING 0.0 - 0.1 K/uL   WBC Morphology PENDING  RBC Morphology PENDING    Smear Review PENDING    nRBC PENDING 0 /100 WBC   Metamyelocytes Relative PENDING %   Myelocytes PENDING %   Promyelocytes Absolute PENDING %   Blasts PENDING %  Protime-INR  Result Value Ref Range   Prothrombin Time 20.6 (H) 11.6 - 15.2 seconds   INR 1.77 (H) 0.00 - 1.49  I-stat troponin, ED (not at [facility])  Result Value Ref Range   Troponin i, poc 0.04 0.00 - 0.08 ng/mL   Comment 3           Imaging Review Dg Chest 2 View  12/28/2015  CLINICAL DATA:  Shortness of breath. Swelling in the feet for 2 weeks. Congestive heart failure. Hypertension. EXAM: CHEST  2 VIEW COMPARISON:  Multiple exams, including 10/01/2015 FINDINGS: Moderate to prominent cardiomegaly observed.  AICD in place. There is minimal chronic scarring at both lung bases. No pleural effusion or edema. No adenopathy identified. IMPRESSION: 1. Moderate to prominent cardiomegaly, without current edema. 2. Mild bibasilar scarring. Electronically Signed   By: Gaylyn Rong M.D.   On: 12/28/2015 15:52   I have personally reviewed and evaluated these images and lab results as part of my medical decision-making.   EKG Interpretation   Date/Time:  Wednesday December 28 2015 15:19:23 EST Ventricular Rate:  102 PR Interval:  164 QRS Duration: 100 QT Interval:  396 QTC Calculation: 516 R Axis:   -85 Text Interpretation:  ** Poor data quality, interpretation may be  adversely affected Sinus tachycardia with occasional Premature ventricular  complexes Biatrial enlargement Left axis deviation Inferior infarct , age  undetermined Anterior infarct , age undetermined Abnormal ECG When  compared with ECG of 10/02/2015, Premature ventricular complexes are now   Present Confirmed by [facility]  MD, Kemontae Dunklee ([id number]) on 12/28/2015 9:05:49 PM      MDM   Final diagnoses:  Acute on chronic systolic heart failure (HCC)  Peripheral edema  Renal insufficiency  Hypokalemia    CHF exacerbation which is not responding to outpatient management. He is given a dose of furosemide intravenously. Chest x-ray shows cardiomegaly but no overt pulmonary edema. Troponin is normal. Potassium is slightly low at 3.3 and potassium is replaced. He will need additional potassium replacement because of diuresis. Renal function is at about his baseline. Case is discussed with Dr. Sharyn Lull who agrees to admit the patient.    Dione Booze, MD 12/29/15 [phone]

## 2015-12-28 NOTE — H&P (Signed)
Parker Johnson is an 50 y.o. male.   Chief Complaint: Shortness of breath associated with leg swelling HPI: Patient is 50 year old male with past medical history significant for multiple medical problems i.e. 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, history of recurrent congestive heart failure secondary to depressed LV systolic function, hypertension, history of paroxysmal nonsustained VT and atrial fibrillation in the past, history of cardiogenic non-Q-wave myocardial infarction the past, history of cardiac emboli to the right arm status post thrombectomy in July 2014, history of alcohol abuse, history of tobacco abuse, history of polysubstance abuse in the past, history of gouty arthritis, came to the ER complaining of progressive increasing shortness of breath associated with leg swelling for last 2 weeks states has been using torsemide 100 mg twice daily which is double the dose without much good diuresis. Patient denies any noncompliance to diet. Denies any chest pain nausea vomiting diaphoresis. Denies palpitation lightheadedness or syncope. Patient also gives history of PND orthopnea for last few days states some days he has to sit in the chair as his breathing is getting worse. Denies any fever chills denies any urinary complaints.  Past Medical History  Diagnosis Date  . Ventricular tachycardia ([place])   . AICD (automatic cardioverter/defibrillator) present     [employer] 100  . Tobacco abuse   . Alcohol abuse     hx  . Cardiomyopathy     secondary  . HTN (hypertension)     uncontrolled  . CHF (congestive heart failure) ([place])   . Gout     Past Surgical History  Procedure Laterality Date  . Cardiac defibrillator placement  2010    [employer] 100  . Embolectomy Right 06/04/2013    Procedure: Upper Extremity Thrombectomy;  Surgeon: Serafina Mitchell, MD;  Location: [facility] OR;  Service: Vascular;  Laterality: Right;    Family History   Problem Relation Age of Onset  . Cardiomyopathy Father     on a defib  . Diabetes Father    Social History:  reports that he has been smoking Cigarettes.  He has a 15 pack-year smoking history. He has never used smokeless tobacco. He reports that he drinks about 0.6 oz of alcohol per week. He reports that he does not use illicit drugs.  Allergies: No Known Allergies   (Not in a hospital admission)  Results for orders placed or performed during the hospital encounter of 12/28/15 (from the past 48 hour(s))  Basic metabolic panel     Status: Abnormal   Collection Time: 12/28/15  3:27 PM  Result Value Ref Range   Sodium 136 135 - 145 mmol/L   Potassium 3.3 (L) 3.5 - 5.1 mmol/L   Chloride 90 (L) 101 - 111 mmol/L   CO2 32 22 - 32 mmol/L   Glucose, Bld 191 (H) 65 - 99 mg/dL   BUN 26 (H) 6 - 20 mg/dL   Creatinine, Ser 1.48 (H) 0.61 - 1.24 mg/dL   Calcium 9.1 8.9 - 10.3 mg/dL   GFR calc non Af Amer 54 (L) >60 mL/min   GFR calc Af Amer >60 >60 mL/min    Comment: (NOTE) The eGFR has been calculated using the CKD EPI equation. This calculation has not been validated in all clinical situations. eGFR's persistently <60 mL/min signify possible Chronic Kidney Disease.    Anion gap 14 5 - 15  CBC     Status: Abnormal   Collection Time: 12/28/15  3:27 PM  Result Value Ref Range   WBC 5.4 4.0 - 10.5 K/uL   RBC 5.06 4.22 - 5.81 MIL/uL   Hemoglobin 14.7 13.0 - 17.0 g/dL   HCT 43.9 39.0 - 52.0 %   MCV 86.8 78.0 - 100.0 fL   MCH 29.1 26.0 - 34.0 pg   MCHC 33.5 30.0 - 36.0 g/dL   RDW 21.3 (H) 11.5 - 15.5 %   Platelets 222 150 - 400 K/uL  I-stat troponin, ED (not at [facility], [facility])     Status: None   Collection Time: 12/28/15  3:44 PM  Result Value Ref Range   Troponin i, poc 0.04 0.00 - 0.08 ng/mL   Comment 3            Comment: Due to the release kinetics of cTnI, a negative result within the first hours of the onset of symptoms does not rule out myocardial infarction with certainty. If  myocardial infarction is still suspected, repeat the test at appropriate intervals.   Protime-INR     Status: Abnormal   Collection Time: 12/28/15  9:15 PM  Result Value Ref Range   Prothrombin Time 21.5 (H) 11.6 - 15.2 seconds   INR 1.88 (H) 0.00 - 1.49   Dg Chest 2 View  12/28/2015  CLINICAL DATA:  Shortness of breath. Swelling in the feet for 2 weeks. Congestive heart failure. Hypertension. EXAM: CHEST  2 VIEW COMPARISON:  Multiple exams, including 10/01/2015 FINDINGS: Moderate to prominent cardiomegaly observed.  AICD in place. There is minimal chronic scarring at both lung bases. No pleural effusion or edema. No adenopathy identified. IMPRESSION: 1. Moderate to prominent cardiomegaly, without current edema. 2. Mild bibasilar scarring. Electronically Signed   By: Van Clines M.D.   On: 12/28/2015 15:52    Review of Systems  Constitutional: Negative for fever and chills.  Eyes: Negative for double vision and photophobia.  Respiratory: Positive for cough and shortness of breath. Negative for sputum production.   Cardiovascular: Positive for orthopnea, leg swelling and PND. Negative for chest pain and palpitations.  Gastrointestinal: Negative for nausea, vomiting and abdominal pain.  Genitourinary: Negative for dysuria.  Neurological: Negative for dizziness and headaches.    Blood pressure 118/83, pulse 94, temperature 97.4 F (36.3 C), temperature source Oral, resp. rate 26, weight 91.173 kg (201 lb), SpO2 92 %. Physical Exam  Constitutional: He is oriented to person, place, and time.  HENT:  Head: Normocephalic and atraumatic.  Eyes: Conjunctivae are normal. Pupils are equal, round, and reactive to light. Left eye exhibits no discharge. No scleral icterus.  Neck: Normal range of motion. Neck supple. JVD present. No tracheal deviation present. No thyromegaly present.  Cardiovascular: Normal rate and regular rhythm.   Murmur (Soft systolic murmur and S3 gallop noted)  heard. Respiratory:  Decreased breath sound at bases with faint bibasilar rales  GI: Soft. Bowel sounds are normal. He exhibits distension. There is no tenderness. There is no rebound.  Musculoskeletal:  No clubbing cyanosis 4+ edema noted  Neurological: He is alert and oriented to person, place, and time.     Assessment/Plan Acute on chronic decompensated systolic heart failure Severe nonischemic dilated myopathy status post ICD in the past Hypertension History of paroxysmal nonsustained VT and A. fib in the past History of cardiogenic non-Q-wave MI in the past History of cardio emboli to the right arm status post thrombectomy in July 2014 History of polysubstance/EtOH/tobacco abuse in the past History of gouty arthritis Chronic kidney disease stage III Plan As per  orders Charolette Forward, MD 12/28/2015, 9:56 PM

## 2015-12-28 NOTE — ED Notes (Signed)
Pt placed on 2L nasal cannula, O2 saturation up to 98%.

## 2015-12-28 NOTE — ED Notes (Signed)
Cardiology at bedside.

## 2015-12-28 NOTE — ED Notes (Signed)
Pt reports that he has had increased leg swelling and sob over the last two weeks. Pt has hx of CHF. Pt alert x4. NAD at this time.

## 2015-12-28 NOTE — Progress Notes (Signed)
ANTICOAGULATION CONSULT NOTE - Follow Up Consult  Pharmacy Consult for Warfarin  Indication: atrial fibrillation  No Known Allergies  Patient Measurements: Weight: 201 lb (91.173 kg) Vital Signs: Temp: 97.4 F (36.3 C) (02/01 1519) Temp Source: Oral (02/01 1519) BP: 121/94 mmHg (02/01 2245) Pulse Rate: 101 (02/01 2245)  Labs:  Recent Labs  12/28/15 1527 12/28/15 2115  HGB 14.7  --   HCT 43.9  --   PLT 222  --   LABPROT  --  21.5*  INR  --  1.88*  CREATININE 1.48*  --     Estimated Creatinine Clearance: 65 mL/min (by C-G formula based on Cr of 1.48).  Assessment: 50 y/o M here with heart failure exacerbation, also on warfarin PTA for afib, INR is 1.88 on admit, CBC good, PTA dose is 10 mg daily with last dose on 1/31.   Goal of Therapy:  INR 2-3 Monitor platelets by anticoagulation protocol: Yes   Plan:  -Warfarin 10 mg PO x 1 now -Daily PT/INR -Monitor for bleeding  Parker Johnson, Parker Johnson 12/28/2015,11:45 PM

## 2015-12-28 NOTE — Progress Notes (Addendum)
MEDICATION RELATED CONSULT NOTE - INITIAL   Pharmacy Consult for Milrinone  Indication: Acute decompensated systolic heart failure   No Known Allergies  Patient Measurements: Weight: 201 lb (91.173 kg)  Vital Signs: Temp: 97.4 F (36.3 C) (02/01 1519) Temp Source: Oral (02/01 1519) BP: 121/94 mmHg (02/01 2245) Pulse Rate: 101 (02/01 2245)  Labs:  Recent Labs  12/28/15 1527  WBC 5.4  HGB 14.7  HCT 43.9  PLT 222  CREATININE 1.48*   Estimated Creatinine Clearance: 65 mL/min (by C-G formula based on Cr of 1.48).  Medical History: Past Medical History  Diagnosis Date  . Ventricular tachycardia ([facility])   . AICD (automatic cardioverter/defibrillator) present     [employer] 100  . Tobacco abuse   . Alcohol abuse     hx  . Cardiomyopathy     secondary  . HTN (hypertension)     uncontrolled  . CHF (congestive heart failure) ([facility])   . Gout      Assessment: 50 y/o M with significant cardiac history here with shortness of breath/leg swelling. Pt has been using doubling his dose of torsemide to 100 mg BID without any increase diuresis. BP and HR are currently stable, noted renal dysfunction, starting milrinone per pharmacy at the request of cardiology.   Plan:  -Initiate Milrinone at 0.375 mcg/kg/minute  -Frequent hemodynamic monitoring  -Further plans per cardiology  [place], [place] 12/28/2015,11:34 PM

## 2015-12-29 LAB — COMPREHENSIVE METABOLIC PANEL
ALBUMIN: 3 g/dL — AB (ref 3.5–5.0)
ALT: 55 U/L (ref 17–63)
ANION GAP: 13 (ref 5–15)
AST: 35 U/L (ref 15–41)
Alkaline Phosphatase: 94 U/L (ref 38–126)
BILIRUBIN TOTAL: 4 mg/dL — AB (ref 0.3–1.2)
BUN: 25 mg/dL — ABNORMAL HIGH (ref 6–20)
CO2: 34 mmol/L — ABNORMAL HIGH (ref 22–32)
Calcium: 8.9 mg/dL (ref 8.9–10.3)
Chloride: 90 mmol/L — ABNORMAL LOW (ref 101–111)
Creatinine, Ser: 1.49 mg/dL — ABNORMAL HIGH (ref 0.61–1.24)
GFR calc Af Amer: >60 mL/min (ref >60)
GFR calc non Af Amer: 53 mL/min — ABNORMAL LOW (ref >60)
GLUCOSE: 158 mg/dL — AB (ref 65–99)
POTASSIUM: 3.1 mmol/L — AB (ref 3.5–5.1)
Sodium: 137 mmol/L (ref 135–145)
TOTAL PROTEIN: 7.3 g/dL (ref 6.5–8.1)

## 2015-12-29 LAB — CBC WITH DIFFERENTIAL/PLATELET
BASOS ABS: 0.1 10*3/uL (ref 0.0–0.1)
Basophils Relative: 1 %
EOS ABS: 0.1 10*3/uL (ref 0.0–0.7)
Eosinophils Relative: 1 %
HEMATOCRIT: 43 % (ref 39.0–52.0)
Hemoglobin: 14.7 g/dL (ref 13.0–17.0)
LYMPHS PCT: 37 %
Lymphs Abs: 2.1 10*3/uL (ref 0.7–4.0)
MCH: 29.4 pg (ref 26.0–34.0)
MCHC: 34.2 g/dL (ref 30.0–36.0)
MCV: 86 fL (ref 78.0–100.0)
MONO ABS: 0.3 10*3/uL (ref 0.1–1.0)
Monocytes Relative: 6 %
NEUTROS PCT: 55 %
Neutro Abs: 3 10*3/uL (ref 1.7–7.7)
Platelets: 217 10*3/uL (ref 150–400)
RBC: 5 MIL/uL (ref 4.22–5.81)
RDW: 21.1 % — AB (ref 11.5–15.5)
WBC: 5.6 10*3/uL (ref 4.0–10.5)

## 2015-12-29 LAB — TROPONIN I
TROPONIN I: 0.04 ng/mL — AB (ref <0.031)
TROPONIN I: 0.07 ng/mL — AB (ref <0.031)
Troponin I: 0.06 ng/mL — ABNORMAL HIGH (ref <0.031)

## 2015-12-29 LAB — BASIC METABOLIC PANEL
ANION GAP: 12 (ref 5–15)
ANION GAP: 15 (ref 5–15)
BUN: 25 mg/dL — AB (ref 6–20)
BUN: 25 mg/dL — ABNORMAL HIGH (ref 6–20)
CALCIUM: 8.5 mg/dL — AB (ref 8.9–10.3)
CO2: 32 mmol/L (ref 22–32)
CO2: 33 mmol/L — ABNORMAL HIGH (ref 22–32)
Calcium: 8.6 mg/dL — ABNORMAL LOW (ref 8.9–10.3)
Chloride: 90 mmol/L — ABNORMAL LOW (ref 101–111)
Chloride: 90 mmol/L — ABNORMAL LOW (ref 101–111)
Creatinine, Ser: 1.42 mg/dL — ABNORMAL HIGH (ref 0.61–1.24)
Creatinine, Ser: 1.41 mg/dL — ABNORMAL HIGH (ref 0.61–1.24)
GFR calc Af Amer: >60 mL/min (ref >60)
GFR calc non Af Amer: 57 mL/min — ABNORMAL LOW (ref >60)
GFR, EST NON AFRICAN AMERICAN: 57 mL/min — AB (ref >60)
GLUCOSE: 167 mg/dL — AB (ref 65–99)
Glucose, Bld: 162 mg/dL — ABNORMAL HIGH (ref 65–99)
POTASSIUM: 3.1 mmol/L — AB (ref 3.5–5.1)
POTASSIUM: 3.2 mmol/L — AB (ref 3.5–5.1)
Sodium: 137 mmol/L (ref 135–145)
Sodium: 135 mmol/L (ref 135–145)

## 2015-12-29 LAB — PROTIME-INR
INR: 1.77 — ABNORMAL HIGH (ref 0.00–1.49)
INR: 1.95 — ABNORMAL HIGH (ref 0.00–1.49)
PROTHROMBIN TIME: 20.6 s — AB (ref 11.6–15.2)
Prothrombin Time: 22.1 seconds — ABNORMAL HIGH (ref 11.6–15.2)

## 2015-12-29 LAB — BRAIN NATRIURETIC PEPTIDE: B NATRIURETIC PEPTIDE 5: 4130.4 pg/mL — AB (ref 0.0–100.0)

## 2015-12-29 LAB — MRSA PCR SCREENING: MRSA BY PCR: NEGATIVE

## 2015-12-29 LAB — DIGOXIN LEVEL

## 2015-12-29 LAB — MAGNESIUM: Magnesium: 1.6 mg/dL — ABNORMAL LOW (ref 1.7–2.4)

## 2015-12-29 LAB — TSH: TSH: 4.823 u[IU]/mL — AB (ref 0.350–4.500)

## 2015-12-29 MED ORDER — NICOTINE 14 MG/24HR TD PT24
14.0000 mg | MEDICATED_PATCH | Freq: Every day | TRANSDERMAL | Status: DC
  Administered 2015-12-29 – 2016-01-01 (×5): 14 mg via TRANSDERMAL
  Filled 2015-12-29 (×5): qty 1

## 2015-12-29 MED ORDER — POTASSIUM CHLORIDE CRYS ER 20 MEQ PO TBCR
40.0000 meq | EXTENDED_RELEASE_TABLET | Freq: Once | ORAL | Status: AC
  Administered 2015-12-29: 40 meq via ORAL
  Filled 2015-12-29: qty 2

## 2015-12-29 MED ORDER — WARFARIN SODIUM 10 MG PO TABS
10.0000 mg | ORAL_TABLET | Freq: Once | ORAL | Status: AC
  Administered 2015-12-29: 10 mg via ORAL
  Filled 2015-12-29: qty 1

## 2015-12-29 MED ORDER — CARVEDILOL 3.125 MG PO TABS
3.1250 mg | ORAL_TABLET | Freq: Two times a day (BID) | ORAL | Status: DC
  Administered 2015-12-29 – 2016-01-01 (×8): 3.125 mg via ORAL
  Filled 2015-12-29 (×9): qty 1

## 2015-12-29 MED ORDER — MAGNESIUM SULFATE 2 GM/50ML IV SOLN
2.0000 g | Freq: Once | INTRAVENOUS | Status: AC
  Administered 2015-12-29: 2 g via INTRAVENOUS
  Filled 2015-12-29: qty 50

## 2015-12-29 MED ORDER — CETYLPYRIDINIUM CHLORIDE 0.05 % MT LIQD
7.0000 mL | Freq: Two times a day (BID) | OROMUCOSAL | Status: DC
  Administered 2015-12-30 – 2016-01-01 (×3): 7 mL via OROMUCOSAL

## 2015-12-29 NOTE — Progress Notes (Signed)
Subjective:  Patient denies any chest pain states breathing and leg swelling has improved  Objective:  Vital Signs in the last 24 hours: Temp:  [97.2 F (36.2 C)-97.9 F (36.6 C)] 97.9 F (36.6 C) (02/02 0754) Pulse Rate:  [85-101] 101 (02/02 0833) Resp:  [12-34] 29 (02/02 0754) BP: (97-126)/(67-96) 126/92 mmHg (02/02 0833) SpO2:  [90 %-100 %] 96 % (02/02 0754) Weight:  [89.1 kg (196 lb 6.9 oz)-91.173 kg (201 lb)] 89.1 kg (196 lb 6.9 oz) (02/02 0500)  Intake/Output from previous day: 02/01 0701 - 02/02 0700 In: 53.5 [I.V.:53.5] Out: 725 [Urine:725] Intake/Output from this shift:    Physical Exam: Neck: no adenopathy, no carotid bruit, no JVD and supple, symmetrical, trachea midline Lungs: Decrease breath sounds at bases with faint rales Heart: regular rate and rhythm, S1, S2 normal and Soft systolic murmur and S3 gallop noted Abdomen: soft, non-tender; bowel sounds normal; no masses,  no organomegaly Extremities: No clubbing cyanosis 2+ edema noted  Lab Results:  Recent Labs  12/28/15 1527 12/29/15 0001  WBC 5.4 5.6  HGB 14.7 14.7  PLT 222 217    Recent Labs  12/29/15 0001 12/29/15 0630  NA 137 135  137  K 3.1* 3.2*  3.1*  CL 90* 90*  90*  CO2 34* 33*  32  GLUCOSE 158* 162*  167*  BUN 25* 25*  25*  CREATININE 1.49* 1.41*  1.42*    Recent Labs  12/29/15 0001 12/29/15 0630  TROPONINI 0.07* 0.06*   Hepatic Function Panel  Recent Labs  12/29/15 0001  PROT 7.3  ALBUMIN 3.0*  AST 35  ALT 55  ALKPHOS 94  BILITOT 4.0*   No results for input(s): CHOL in the last 72 hours. No results for input(s): PROTIME in the last 72 hours.  Imaging: Imaging results have been reviewed and Dg Chest 2 View  12/28/2015  CLINICAL DATA:  Shortness of breath. Swelling in the feet for 2 weeks. Congestive heart failure. Hypertension. EXAM: CHEST  2 VIEW COMPARISON:  Multiple exams, including 10/01/2015 FINDINGS: Moderate to prominent cardiomegaly observed.  AICD in  place. There is minimal chronic scarring at both lung bases. No pleural effusion or edema. No adenopathy identified. IMPRESSION: 1. Moderate to prominent cardiomegaly, without current edema. 2. Mild bibasilar scarring. Electronically Signed   By: Gaylyn Rong M.D.   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 fib in the past History of cardiogenic non-Q-wave MI in the past History of cardio emboli to the right arm status post thrombectomy in July 2014 History of polysubstance/EtOH/tobacco abuse in the past History of gouty arthritis Chronic kidney disease stage III Hypokalemia Hypomagnesemia Plan Replace K and magnesium Continue rest of the medication for now Check labs in a.m.  Will hold BiDil for now in view of tachycardia Continue low-dose beta blockers and digoxin Hold Ace inhibitors for now once euvolemic Will reduce diuretics and start ACE inhibitor  LOS: 1 day    Rinaldo Cloud 12/29/2015, 9:48 AM

## 2015-12-29 NOTE — Progress Notes (Signed)
Patient experienced a 26 beat run of VT.  VVS.  Patient assessed and unaware of any changes during this time.  Dr. Sharyn Lull paged to inform of event.

## 2015-12-29 NOTE — Care Management Note (Signed)
Case Management Note  Patient Details  Name: Parker Johnson MRN: [id number] Date of Birth: 09-23-1966  Subjective/Objective:    Adm w chf                Action/Plan: lives w wife, pcp dr Sharyn Lull  Expected Discharge Date:                  Expected Discharge Plan:  Home w Home Health Services  In-House Referral:     Discharge planning Services  CM Consult  Post Acute Care Choice:    Choice offered to:     DME Arranged:    DME Agency:     HH Arranged:    HH Agency:     Status of Service:     Medicare Important Message Given:    Date Medicare IM Given:    Medicare IM give by:    Date Additional Medicare IM Given:    Additional Medicare Important Message give by:     If discussed at Long Length of Stay Meetings, dates discussed:    Additional Comments: may need hhc. Ur review done.  Hanley Hays, RN 12/29/2015, 8:43 AM

## 2015-12-29 NOTE — Progress Notes (Signed)
ANTICOAGULATION CONSULT NOTE - Follow Up Consult  Pharmacy Consult for Warfarin  Indication: atrial fibrillation  No Known Allergies  Patient Measurements: Height: 5\' 7"  (170.2 cm) Weight: 196 lb 6.9 oz (89.1 kg) IBW/kg (Calculated) : 66.1 Vital Signs: Temp: 97.9 F (36.6 C) (02/02 0754) Temp Source: Oral (02/02 0754) BP: 126/92 mmHg (02/02 0833) Pulse Rate: 101 (02/02 0833)  Labs:  Recent Labs  12/28/15 1527 12/28/15 2115 12/29/15 0001 12/29/15 0630  HGB 14.7  --  14.7  --   HCT 43.9  --  43.0  --   PLT 222  --  217  --   LABPROT  --  21.5* 20.6* 22.1*  INR  --  1.88* 1.77* 1.95*  CREATININE 1.48*  --  1.49* 1.41*  1.42*  TROPONINI  --   --  0.07* 0.06*    Estimated Creatinine Clearance: 67.5 mL/min (by C-G formula based on Cr of 1.41).  Assessment: 50 y/o M here with heart failure exacerbation, also on warfarin PTA for afib, INR is 1.95 (trend up).  PTA dose is 10 mg daily with last dose on 1/31. CBC stable  Goal of Therapy:  INR 2-3 Monitor platelets by anticoagulation protocol: Yes   Plan:  -Warfarin 10 mg PO x 1 today -Daily PT/INR  Harland German, Pharm D 12/29/2015 9:49 AM

## 2015-12-29 NOTE — Progress Notes (Signed)
Dr. Sharyn Lull made aware of 26 beats of VT followed by another short burst of VT.  Informed that patient was asymptomatic with this.  No new orders given.  Will continue to monitor.

## 2015-12-30 LAB — BASIC METABOLIC PANEL
Anion gap: 10 (ref 5–15)
BUN: 23 mg/dL — AB (ref 6–20)
CALCIUM: 8.8 mg/dL — AB (ref 8.9–10.3)
CHLORIDE: 93 mmol/L — AB (ref 101–111)
CO2: 34 mmol/L — AB (ref 22–32)
CREATININE: 1.32 mg/dL — AB (ref 0.61–1.24)
GFR calc Af Amer: >60 mL/min (ref >60)
GFR calc non Af Amer: >60 mL/min (ref >60)
GLUCOSE: 154 mg/dL — AB (ref 65–99)
Potassium: 3.8 mmol/L (ref 3.5–5.1)
Sodium: 137 mmol/L (ref 135–145)

## 2015-12-30 LAB — MAGNESIUM: Magnesium: 1.9 mg/dL (ref 1.7–2.4)

## 2015-12-30 LAB — PROTIME-INR
INR: 2.2 — AB (ref 0.00–1.49)
Prothrombin Time: 24.2 seconds — ABNORMAL HIGH (ref 11.6–15.2)

## 2015-12-30 MED ORDER — WARFARIN SODIUM 7.5 MG PO TABS
7.5000 mg | ORAL_TABLET | Freq: Once | ORAL | Status: AC
  Administered 2015-12-30: 7.5 mg via ORAL
  Filled 2015-12-30: qty 1

## 2015-12-30 NOTE — Progress Notes (Signed)
Subjective:  Patient denies any chest pain or shortness of breath.  Denies any palpitation, lightheadedness or syncope.  Noted to have brief episodes of nonsustained VT yesterday, asymptomatic.  No ICD discharges.  States leg swelling and breathing is improved overall tolerating milrinone.  Objective:  Vital Signs in the last 24 hours: Temp:  [97.2 F (36.2 C)-97.7 F (36.5 C)] 97.6 F (36.4 C) (02/03 1143) Pulse Rate:  [95-97] 97 (02/02 2000) Resp:  [15-31] 26 (02/03 1143) BP: (86-148)/(50-116) 118/88 mmHg (02/03 1143) SpO2:  [83 %-100 %] 95 % (02/03 1143) Weight:  [91.4 kg (201 lb 8 oz)] 91.4 kg (201 lb 8 oz) (02/03 0600)  Intake/Output from previous day: 02/02 0701 - 02/03 0700 In: 968 [P.O.:505; I.V.:463] Out: 2100 [Urine:2100] Intake/Output from this shift: Total I/O In: -  Out: 200 [Urine:200]  Physical Exam: Neck: no adenopathy, no carotid bruit, no JVD and supple, symmetrical, trachea midline Lungs: decreased breath sounds at bases Heart: regular rate and rhythm, S1, S2 normal and soft systolic murmur and S3 gallop noted Abdomen: soft, non-tender; bowel sounds normal; no masses,  no organomegaly Extremities: no clubbing, cyanosis, 2+ edema noted  Lab Results:  Recent Labs  12/28/15 1527 12/29/15 0001  WBC 5.4 5.6  HGB 14.7 14.7  PLT 222 217    Recent Labs  12/29/15 0630 12/30/15 0250  NA 135  137 137  K 3.2*  3.1* 3.8  CL 90*  90* 93*  CO2 33*  32 34*  GLUCOSE 162*  167* 154*  BUN 25*  25* 23*  CREATININE 1.41*  1.42* 1.32*    Recent Labs  12/29/15 0630 12/29/15 0944  TROPONINI 0.06* 0.04*   Hepatic Function Panel  Recent Labs  12/29/15 0001  PROT 7.3  ALBUMIN 3.0*  AST 35  ALT 55  ALKPHOS 94  BILITOT 4.0*   No results for input(s): CHOL in the last 72 hours. No results for input(s): PROTIME in the last 72 hours.  Imaging: Imaging results have been reviewed and Dg Chest 2 View  12/28/2015  CLINICAL DATA:  Shortness of breath.  Swelling in the feet for 2 weeks. Congestive heart failure. Hypertension. EXAM: CHEST  2 VIEW COMPARISON:  Multiple exams, including 10/01/2015 FINDINGS: Moderate to prominent cardiomegaly observed.  AICD in place. There is minimal chronic scarring at both lung bases. No pleural effusion or edema. No adenopathy identified. IMPRESSION: 1. Moderate to prominent cardiomegaly, without current edema. 2. Mild bibasilar scarring. Electronically Signed   By: Gaylyn Rong M.D.   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 fib in the past History of cardiogenic non-Q-wave MI in the past History of cardio emboli to the right arm status post thrombectomy in July 2014 History of polysubstance/EtOH/tobacco abuse in the past History of gouty arthritis Chronic kidney disease stage III Status post Hypokalemia Status post Hypomagnesemia Plan Continue present management. Check labs in a.m.   LOS: 2 days    Rinaldo Cloud 12/30/2015, 12:08 PM

## 2015-12-30 NOTE — Progress Notes (Signed)
ANTICOAGULATION CONSULT NOTE - Follow Up Consult  Pharmacy Consult for Warfarin  Indication: atrial fibrillation  No Known Allergies  Patient Measurements: Height: 5\' 7"  (170.2 cm) Weight: 201 lb 8 oz (91.4 kg) IBW/kg (Calculated) : 66.1 Vital Signs: Temp: 97.2 F (36.2 C) (02/03 0700) Temp Source: Oral (02/03 0700) BP: 118/83 mmHg (02/03 0700)  Labs:  Recent Labs  12/28/15 1527  12/29/15 0001 12/29/15 0630 12/29/15 0944 12/30/15 0250  HGB 14.7  --  14.7  --   --   --   HCT 43.9  --  43.0  --   --   --   PLT 222  --  217  --   --   --   LABPROT  --   < > 20.6* 22.1*  --  24.2*  INR  --   < > 1.77* 1.95*  --  2.20*  CREATININE 1.48*  --  1.49* 1.41*  1.42*  --  1.32*  TROPONINI  --   --  0.07* 0.06* 0.04*  --   < > = values in this interval not displayed.  Estimated Creatinine Clearance: 73 mL/min (by C-G formula based on Cr of 1.32).  Assessment: 50 y/o M here with heart failure exacerbation, also on warfarin PTA for afib, INR is 2.2 (trend up).  PTA dose is 10 mg daily.  Goal of Therapy:  INR 2-3 Monitor platelets by anticoagulation protocol: Yes   Plan:  -Warfarin 7.5 mg PO x 1 today -Daily PT/INR  Harland German, Pharm D 12/30/2015 8:29 AM

## 2015-12-31 LAB — BASIC METABOLIC PANEL
Anion gap: 10 (ref 5–15)
Anion gap: 11 (ref 5–15)
BUN: 19 mg/dL (ref 6–20)
BUN: 17 mg/dL (ref 6–20)
CALCIUM: 8.9 mg/dL (ref 8.9–10.3)
CALCIUM: 9 mg/dL (ref 8.9–10.3)
CO2: 34 mmol/L — ABNORMAL HIGH (ref 22–32)
CO2: 32 mmol/L (ref 22–32)
CREATININE: 1.13 mg/dL (ref 0.61–1.24)
Chloride: 93 mmol/L — ABNORMAL LOW (ref 101–111)
Chloride: 95 mmol/L — ABNORMAL LOW (ref 101–111)
Creatinine, Ser: 1.27 mg/dL — ABNORMAL HIGH (ref 0.61–1.24)
GFR calc Af Amer: >60 mL/min (ref >60)
GFR calc non Af Amer: >60 mL/min (ref >60)
Glucose, Bld: 138 mg/dL — ABNORMAL HIGH (ref 65–99)
Glucose, Bld: 190 mg/dL — ABNORMAL HIGH (ref 65–99)
Potassium: 4 mmol/L (ref 3.5–5.1)
Potassium: 3.8 mmol/L (ref 3.5–5.1)
SODIUM: 137 mmol/L (ref 135–145)
SODIUM: 138 mmol/L (ref 135–145)

## 2015-12-31 LAB — CBC
HCT: 40.9 % (ref 39.0–52.0)
Hemoglobin: 13.8 g/dL (ref 13.0–17.0)
MCH: 29.3 pg (ref 26.0–34.0)
MCHC: 33.7 g/dL (ref 30.0–36.0)
MCV: 86.8 fL (ref 78.0–100.0)
PLATELETS: 181 10*3/uL (ref 150–400)
RBC: 4.71 MIL/uL (ref 4.22–5.81)
RDW: 21 % — AB (ref 11.5–15.5)
WBC: 5.2 10*3/uL (ref 4.0–10.5)

## 2015-12-31 LAB — PROTIME-INR
INR: 2.75 — AB (ref 0.00–1.49)
Prothrombin Time: 28.6 seconds — ABNORMAL HIGH (ref 11.6–15.2)

## 2015-12-31 LAB — MAGNESIUM: MAGNESIUM: 1.7 mg/dL (ref 1.7–2.4)

## 2015-12-31 LAB — BRAIN NATRIURETIC PEPTIDE: B Natriuretic Peptide: 2584.7 pg/mL — ABNORMAL HIGH (ref 0.0–100.0)

## 2015-12-31 MED ORDER — SACUBITRIL-VALSARTAN 24-26 MG PO TABS
1.0000 | ORAL_TABLET | Freq: Two times a day (BID) | ORAL | Status: DC
  Administered 2015-12-31 – 2016-01-01 (×2): 1 via ORAL
  Filled 2015-12-31 (×3): qty 1

## 2015-12-31 MED ORDER — SACUBITRIL-VALSARTAN 24-26 MG PO TABS
1.0000 | ORAL_TABLET | Freq: Two times a day (BID) | ORAL | Status: DC
  Filled 2015-12-31 (×2): qty 1

## 2015-12-31 MED ORDER — POTASSIUM CHLORIDE CRYS ER 20 MEQ PO TBCR
20.0000 meq | EXTENDED_RELEASE_TABLET | Freq: Every day | ORAL | Status: DC
  Administered 2016-01-01: 20 meq via ORAL
  Filled 2015-12-31: qty 1

## 2015-12-31 MED ORDER — FUROSEMIDE 10 MG/ML IJ SOLN
40.0000 mg | Freq: Two times a day (BID) | INTRAMUSCULAR | Status: DC
  Administered 2015-12-31 – 2016-01-01 (×2): 40 mg via INTRAVENOUS
  Filled 2015-12-31 (×2): qty 4

## 2015-12-31 MED ORDER — WARFARIN SODIUM 5 MG PO TABS
5.0000 mg | ORAL_TABLET | Freq: Once | ORAL | Status: AC
  Administered 2015-12-31: 5 mg via ORAL
  Filled 2015-12-31 (×2): qty 1

## 2015-12-31 NOTE — Progress Notes (Signed)
Pt has had multiple episodes of VT and wide QRS. PT asymptomatic. BP stable, and oxygen >94% on RA. Dr. Tarri Glenn, on call for Cardiology notified. No new orders at this time. Will continue to monitor.

## 2015-12-31 NOTE — Progress Notes (Signed)
ANTICOAGULATION CONSULT NOTE - Follow Up Consult  Pharmacy Consult for warfarin Indication: atrial fibrillation  No Known Allergies  Patient Measurements: Height: 5\' 7"  (170.2 cm) Weight: 202 lb 13.2 oz (92 kg) IBW/kg (Calculated) : 66.1  Vital Signs: Temp: 98.1 F (36.7 C) (02/04 0839) Temp Source: Oral (02/04 0839) BP: 133/86 mmHg (02/04 0839) Pulse Rate: 83 (02/04 0839)  Labs:  Recent Labs  12/28/15 1527  12/29/15 0001 12/29/15 0630 12/29/15 0944 12/30/15 0250 12/31/15 0238  HGB 14.7  --  14.7  --   --   --  13.8  HCT 43.9  --  43.0  --   --   --  40.9  PLT 222  --  217  --   --   --  181  LABPROT  --   < > 20.6* 22.1*  --  24.2* 28.6*  INR  --   < > 1.77* 1.95*  --  2.20* 2.75*  CREATININE 1.48*  --  1.49* 1.41*  1.42*  --  1.32* 1.27*  TROPONINI  --   --  0.07* 0.06* 0.04*  --   --   < > = values in this interval not displayed.  Estimated Creatinine Clearance: 76.1 mL/min (by C-G formula based on Cr of 1.27).   Assessment: 50 yo M admitted 12/28/2015 on warfarin prior to admission for Afib. Pharmacy consulted to dose warfarin.   PTA warfarin dose: 10 mg daily  INR 2.75 (trending up quickly). Likely acute HF effecting coumadin sensitivity. H/H stable, Plt wnl. No s/sx of bleeding.  Goal of Therapy:  INR 2-3 Monitor platelets by anticoagulation protocol: Yes   Plan:  - Coumadin 5 mg po x1 - Monitor daily INR, CBC and s/sx of bleeding  Casilda Carls, PharmD. PGY-1 Pharmacy Resident Pager: [phone] 12/31/2015,9:13 AM

## 2015-12-31 NOTE — Progress Notes (Signed)
Subjective:  Denies any chest pain shortness of breath or palpitations. States breathing and leg swelling has improved. Had episodes of nonsustained VT on the monitor asymptomatic  Objective:  Vital Signs in the last 24 hours: Temp:  [97.4 F (36.3 C)-98.1 F (36.7 C)] 98.1 F (36.7 C) (02/04 0839) Pulse Rate:  [83-90] 90 (02/04 0900) Resp:  [21-30] 29 (02/04 0900) BP: (100-133)/(37-101) 133/86 mmHg (02/04 0839) SpO2:  [92 %-99 %] 98 % (02/04 0900) Weight:  [92 kg (202 lb 13.2 oz)] 92 kg (202 lb 13.2 oz) (02/04 0631)  Intake/Output from previous day: 02/03 0701 - 02/04 0700 In: 1253 [P.O.:900; I.V.:353] Out: 2150 [Urine:2150] Intake/Output from this shift: Total I/O In: 30 [I.V.:30] Out: -   Physical Exam: Neck: no adenopathy, no carotid bruit, no JVD and supple, symmetrical, trachea midline Lungs: Decrease breath sounds at bases air entry has improved Heart: regular rate and rhythm, S1, S2 normal and Soft systolic murmur and S3 gallop noted Abdomen: soft, non-tender; bowel sounds normal; no masses,  no organomegaly Extremities: No clubbing cyanosis 1+ edema noted  Lab Results:  Recent Labs  12/29/15 0001 12/31/15 0238  WBC 5.6 5.2  HGB 14.7 13.8  PLT 217 181    Recent Labs  12/30/15 0250 12/31/15 0238  NA 137 137  K 3.8 4.0  CL 93* 93*  CO2 34* 34*  GLUCOSE 154* 138*  BUN 23* 19  CREATININE 1.32* 1.27*    Recent Labs  12/29/15 0630 12/29/15 0944  TROPONINI 0.06* 0.04*   Hepatic Function Panel  Recent Labs  12/29/15 0001  PROT 7.3  ALBUMIN 3.0*  AST 35  ALT 55  ALKPHOS 94  BILITOT 4.0*   No results for input(s): CHOL in the last 72 hours. No results for input(s): PROTIME in the last 72 hours.  Imaging: Imaging results have been reviewed and No results found.  Cardiac Studies:  Assessment/Plan:  Resolving Acute on chronic decompensated systolic heart failure Minimally elevated troponin I secondary to above doubt significant MI Severe  nonischemic dilated cardiomyopathy status post ICD in the past Hypertension Status post nonsustained VT, asymptomatic History of paroxysmal nonsustained VT and A. fib in the past History of cardiogenic non-Q-wave MI in the past History of cardio emboli to the right arm status post thrombectomy in July 2014 History of polysubstance/EtOH/tobacco abuse in the past History of gouty arthritis Chronic kidney disease stage III Plan Reduce milrinone dose in view of recurrent nonsustained VT Patient is off ACE inhibitor for more than 36 hours will start on Entresto as per orders Reduce IV Lasix as per orders Monitor renal function in a.m.  LOS: 3 days    Rinaldo Cloud 12/31/2015, 10:20 AM

## 2016-01-01 LAB — PROTIME-INR
INR: 2.84 — AB (ref 0.00–1.49)
PROTHROMBIN TIME: 29.3 s — AB (ref 11.6–15.2)

## 2016-01-01 LAB — BASIC METABOLIC PANEL
ANION GAP: 14 (ref 5–15)
BUN: 18 mg/dL (ref 6–20)
CHLORIDE: 92 mmol/L — AB (ref 101–111)
CO2: 30 mmol/L (ref 22–32)
Calcium: 9 mg/dL (ref 8.9–10.3)
Creatinine, Ser: 1.23 mg/dL (ref 0.61–1.24)
GFR calc non Af Amer: >60 mL/min (ref >60)
Glucose, Bld: 103 mg/dL — ABNORMAL HIGH (ref 65–99)
Potassium: 3.8 mmol/L (ref 3.5–5.1)
SODIUM: 136 mmol/L (ref 135–145)

## 2016-01-01 MED ORDER — NICOTINE 14 MG/24HR TD PT24: 14.0000 mg | MEDICATED_PATCH | Freq: Every day | TRANSDERMAL | Status: AC

## 2016-01-01 MED ORDER — WARFARIN SODIUM 2.5 MG PO TABS: 2.5000 mg | ORAL_TABLET | Freq: Once | ORAL | Status: DC

## 2016-01-01 MED ORDER — SACUBITRIL-VALSARTAN 24-26 MG PO TABS: 1.0000 | ORAL_TABLET | Freq: Two times a day (BID) | ORAL | Status: DC

## 2016-01-01 MED ORDER — WARFARIN SODIUM 10 MG PO TABS: ORAL_TABLET | ORAL | Status: DC

## 2016-01-01 NOTE — Discharge Instructions (Signed)

## 2016-01-01 NOTE — Progress Notes (Signed)
ANTICOAGULATION CONSULT NOTE - Follow Up Consult  Pharmacy Consult for warfarin Indication: atrial fibrillation  No Known Allergies  Patient Measurements: Height: 5\' 7"  (170.2 cm) Weight: 202 lb 11.2 oz (91.944 kg) IBW/kg (Calculated) : 66.1  Vital Signs: Temp: 98.3 F (36.8 C) (02/05 0346) Temp Source: Oral (02/05 0346) BP: 117/94 mmHg (02/05 0346)  Labs:  Recent Labs  12/29/15 0944  12/30/15 0250 12/31/15 0238 12/31/15 1120 01/01/16 0215  HGB  --   --   --  13.8  --   --   HCT  --   --   --  40.9  --   --   PLT  --   --   --  181  --   --   LABPROT  --   --  24.2* 28.6*  --  29.3*  INR  --   --  2.20* 2.75*  --  2.84*  CREATININE  --   < > 1.32* 1.27* 1.13 1.23  TROPONINI 0.04*  --   --   --   --   --   < > = values in this interval not displayed.  Estimated Creatinine Clearance: 78.5 mL/min (by C-G formula based on Cr of 1.23).   Assessment: 50 yo M admitted 12/28/2015 on warfarin prior to admission for Afib. Pharmacy consulted to dose warfarin.   PTA warfarin dose: 10 mg daily  INR= 2.84 (continuing to trend up ). Likely acute HF effecting coumadin sensitivity.  H/H stable, Plt wnl. No s/sx of bleeding.  Goal of Therapy:  INR 2-3 Monitor platelets by anticoagulation protocol: Yes   Plan:  - Coumadin 2.5 mg po x1 - Monitor daily INR, CBC and s/sx of bleeding  Casilda Carls, PharmD. PGY-1 Pharmacy Resident Pager: [phone] 01/01/2016,7:55 AM

## 2016-01-01 NOTE — Discharge Summary (Signed)
NAME:  GLOVER, CAPANO NO.:  [id number]  MEDICAL RECORD NO.:  [id number]  LOCATION:  2H25C                        FACILITY:  [facility]  PHYSICIAN:  Eduardo Osier. Sharyn Lull, M.D. DATE OF BIRTH:  1966/07/14  DATE OF ADMISSION:  12/28/2015 DATE OF DISCHARGE:  01/01/2016                              DISCHARGE SUMMARY   ADMITTING DIAGNOSES: 1. Acute-on-chronic decompensated systolic congestive heart failure. 2. 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 3. Hypertension. 4. History of paroxysmal nonsustained ventricular tachycardia and     atrial fibrillation in the past. 5. History of cardiogenic non-Q-wave myocardial infarction in the     past. 6. History of cardioemboli to the right arm status post thrombectomy     in July 2014. 7. History of polysubstance abuse, EtOH abuse, and tobacco abuse. 8. History of gouty arthritis. 9. Chronic kidney disease, stage 3.  FINAL DIAGNOSES: 1. Resolving acute-on-chronic decompensated systolic heart failure,     severe nonischemic dilated cardiomyopathy status post ICD in the     past. 2. Hypertension. 3. History of paroxysmal nonsustained ventricular tachycardia and     atrial fibrillation in the past. 4. History of cardiogenic non Q-wave myocardial infarction in the     past. 5. History of cardioemboli to the right arm status post thrombectomy     in July 2014. 6. History of polysubstance abuse/EtOH abuse/tobacco abuse. 7. History of gouty arthritis. 8. Chronic kidney disease, stage 3.  DISCHARGE HOME MEDICATIONS: 1. Nicotine patch 14 mg per 24 hours daily. 2. Entresto 24/26 mg 1 tablet twice daily. 3. Allopurinol 100 mg daily. 4. Carvedilol 6.25 mg twice daily. 5. Digoxin 0.25 mg 1 tablet daily. 6. Torsemide 50 mg twice daily. 7. Warfarin 10 mg 5 days per week and 5 mg on Wednesday and Sunday. 8. Patient has been advised to stop lisinopril and spironolactone for     now. The patient has  been advised to restrict fluid to 1 L per 24 hours, monitor his weight daily.  Heart failure instructions have been given. Followup with me in 1 week.  We will check PT/INR and his renal function as outpatient in 1 week.  CONDITION AT DISCHARGE:  Stable.  BRIEF HISTORY AND HOSPITAL COURSE:  Mr. Millstein is a 50 year old male with past medical history significant for multiple medical problems, i.e., history of 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, history of recurrent congestive heart failure secondary to depressed LV systolic function, hypertension, history of paroxysmal nonsustained VT and AFib in the past, history of cardiogenic non-Q-wave myocardial infarction in the past, history of cardiac emboli to the right arm status post thrombectomy in July of 2014, history of alcohol abuse, tobacco abuse, polysubstance abuse in the past, history of gouty arthritis.  He came to the ER complaining of progressive increasing shortness of breath, associated leg swelling for 2 weeks.  States has been using torsemide 100 mg twice daily which is double the dose without much good diuresis.  Patient denies any noncompliance to diet.  Denies any chest pain, shortness of breath.  No nausea, vomiting, diaphoresis. Denies palpitation, lightheadedness, or syncope.  The  patient also gives history of PND, orthopnea for last few days.  States some days he has to sit in the chair as his breathing is getting worse.  The patient denies any fever, chills.  Denies any urinary complaints.  PHYSICAL EXAMINATION:  VITAL SIGNS:  His blood pressure is 118/83, pulse 94.  He is afebrile. HEENT:  Conjunctivae are pink. NECK:  Supple.  Positive JVD. LUNGS:  He has decreased breath sounds at bases with faint bibasilar rales. CARDIOVASCULAR:  S1, S2 is soft.  There was soft S3 gallop. ABDOMEN:  Soft, nontender, distended. EXTREMITIES:  There is no clubbing, cyanosis.  There is 4+  edema. NEUROLOGIC:  Grossly intact.  LABORATORY DATA:  His labs, sodium was 136, potassium 3.3, BUN 26, creatinine 1.48.  Hemoglobin was 14.7, hematocrit 43.9, white count of 5.4, proBNP was 3316, repeat was 4130, yesterday was 2584, which is trending down.  Troponin-I were 0.04, 0.07, 0.067, 0.04.  His last electrolytes, sodium 136, potassium 3.8, chloride is 92, bicarb 30, BUN 18, creatinine 1.23.  His PT/INR today is 29.3, INR of 2.84.  TSH was slightly elevated 4.82.  BRIEF HOSPITAL COURSE:  The patient was admitted to step-down unit.  The patient was started on IV milrinone and furosemide was switched to IV Lasix with good diuresis and resolution of his leg swelling.  The patient states he could sleep flat in his bed for last 2 days without any problem breathing.  The patient had multiple episodes of nonsustained VT asymptomatic on the monitor.  His milrinone dose was reduced.  Also his IV Lasix was reduced and was started on Entresto which he is tolerating well.  His renal function has remained fairly stable.  The patient is eager to go home.  We will up titrate his Entresto and beta-blockers as outpatient as he tolerates.  If his renal function remains stable, we will add aldosterone inhibitors as outpatient as his blood pressure tolerates.  The patient has been given instructions regarding heart failure instructions at length and understands.  The patient will follow up with EP as scheduled as outpatient and will follow up with me in 1 week.  The patient has been advised to refrain from alcohol completely and smoking to which he agrees.     Eduardo Osier. Sharyn Lull, M.D.     [facility]  D:  01/01/2016  T:  01/01/2016  Job:  [id number]

## 2016-01-01 NOTE — Discharge Summary (Signed)
Discharge summary dictated on 01/01/2016, dictation number is [phone]

## 2016-01-01 NOTE — Progress Notes (Signed)
Pt given d/c instructions and verbalizes understanding. Patient with no complaints at the current time. Taken to short stay exit.

## 2016-01-30 ENCOUNTER — Encounter: Payer: Self-pay | Admitting: Internal Medicine

## 2016-01-30 ENCOUNTER — Telehealth: Payer: Self-pay | Admitting: *Deleted

## 2016-01-30 NOTE — Telephone Encounter (Signed)
Report faxed to Dr.Harwani's office. Patient encouraged to call them in the morning for further recommendations.  Patient is overdue to see Dr.Klein. F/u deferred to Glynda Jaeger.

## 2016-01-30 NOTE — Telephone Encounter (Signed)
S4  Well done He should have followed up with primary cardiology And we should see him as it looks 2 yrs or so

## 2016-01-30 NOTE — Telephone Encounter (Signed)
Spoke to patient regarding 41J ICD shock from 2/27. Patient states that he was unaware of the shock and may have actually been sleeping during the time. Patient states that he has been taking all of his medications as prescribed and currently takes his Torsemide 100AM, 50PM without potassium. Last BMP was on 2/5--K+ 3.8.  Patient aware of driving restriction x 6 months.  Will inform Dr.Klein and notify patient of any further recommendations.

## 2016-02-13 ENCOUNTER — Encounter: Payer: Self-pay | Admitting: Internal Medicine

## 2016-02-13 ENCOUNTER — Ambulatory Visit (INDEPENDENT_AMBULATORY_CARE_PROVIDER_SITE_OTHER): Payer: Medicaid Other | Admitting: Internal Medicine

## 2016-02-13 VITALS — BP 120/80 | HR 82 | Ht 67.0 in | Wt 204.4 lb

## 2016-02-13 DIAGNOSIS — I480 Paroxysmal atrial fibrillation: Secondary | ICD-10-CM | POA: Diagnosis not present

## 2016-02-13 DIAGNOSIS — I4901 Ventricular fibrillation: Secondary | ICD-10-CM | POA: Diagnosis not present

## 2016-02-13 DIAGNOSIS — Z95810 Presence of automatic (implantable) cardiac defibrillator: Secondary | ICD-10-CM | POA: Diagnosis not present

## 2016-02-13 DIAGNOSIS — I5022 Chronic systolic (congestive) heart failure: Secondary | ICD-10-CM | POA: Diagnosis not present

## 2016-02-13 DIAGNOSIS — I429 Cardiomyopathy, unspecified: Secondary | ICD-10-CM | POA: Diagnosis not present

## 2016-02-13 DIAGNOSIS — I428 Other cardiomyopathies: Secondary | ICD-10-CM

## 2016-02-13 LAB — BASIC METABOLIC PANEL
BUN: 16 mg/dL (ref 7–25)
CHLORIDE: 99 mmol/L (ref 98–110)
CO2: 32 mmol/L — AB (ref 20–31)
CREATININE: 1.08 mg/dL (ref 0.60–1.35)
Calcium: 8.6 mg/dL (ref 8.6–10.3)
GLUCOSE: 92 mg/dL (ref 65–99)
Potassium: 3.5 mmol/L (ref 3.5–5.3)
Sodium: 142 mmol/L (ref 135–146)

## 2016-02-13 LAB — CUP PACEART INCLINIC DEVICE CHECK
Date Time Interrogation Session: 20170320040000
HIGH POWER IMPEDANCE MEASURED VALUE: 35 Ohm
HIGH POWER IMPEDANCE MEASURED VALUE: 48 Ohm
Implantable Lead Implant Date: 20100923
Implantable Lead Location: 753859
Implantable Lead Model: 5076
Lead Channel Impedance Value: 501 Ohm
Lead Channel Impedance Value: 506 Ohm
Lead Channel Sensing Intrinsic Amplitude: 2.9 mV
MDC IDC LEAD IMPLANT DT: 20100923
MDC IDC LEAD LOCATION: 753860
MDC IDC LEAD MODEL: 158
MDC IDC LEAD SERIAL: 183379
MDC IDC MSMT LEADCHNL RA PACING THRESHOLD AMPLITUDE: 0.7 V
MDC IDC MSMT LEADCHNL RA PACING THRESHOLD PULSEWIDTH: 0.4 ms
MDC IDC MSMT LEADCHNL RV PACING THRESHOLD AMPLITUDE: 0.7 V
MDC IDC MSMT LEADCHNL RV PACING THRESHOLD PULSEWIDTH: 0.4 ms
MDC IDC MSMT LEADCHNL RV SENSING INTR AMPL: 17.3 mV
MDC IDC PG SERIAL: 143406
MDC IDC SET LEADCHNL RA PACING AMPLITUDE: 2 V
MDC IDC SET LEADCHNL RV PACING AMPLITUDE: 2.4 V
MDC IDC SET LEADCHNL RV PACING PULSEWIDTH: 0.4 ms
MDC IDC SET LEADCHNL RV SENSING SENSITIVITY: 0.6 mV

## 2016-02-13 LAB — TSH: TSH: 2.07 mIU/L (ref 0.40–4.50)

## 2016-02-13 MED ORDER — AMIODARONE HCL 400 MG PO TABS: ORAL_TABLET | ORAL | Status: DC

## 2016-02-13 MED ORDER — AMIODARONE HCL 200 MG PO TABS: 200.0000 mg | ORAL_TABLET | Freq: Every day | ORAL | Status: DC

## 2016-02-13 NOTE — Patient Instructions (Addendum)
Medication Instructions: 1) Start amiodarone 400 mg one tablet by mouth twice daily x 2 weeks,      Then decrease amiodarone to 400 mg one tablet by mouth once daily x 2 weeks     Then decrease amiodarone to 200 mg one tablet by mouth once daily after that (you have been given a written prescription for this dose 2) Decrease digoxin 0.25 mg to 1/2 tablet (0.125 mg) by mouth once daily 3) Decrease warfarin 10 mg to 1/2 tablet (5 mg) every day except 10 mg on Wednesdays & Sundays  Labwork: - Your physician recommends that you have lab work today: BMP/ TSH  - You will need to go to [facility] on Monday (3/27) for a repeat INR for Dr. Sharyn Lull   Procedures/Testing: - Your physician has recommended that you have a pulmonary function test. Pulmonary Function Tests are a group of tests that measure how well air moves in and out of your lungs.  Follow-Up: - Your physician recommends that you schedule a follow-up appointment in: 3 months with Francis Dowse, PA/ Gypsy Balsam, NP for Dr. Graciela Husbands.  Any Additional Special Instructions Will Be Listed Below (If Applicable).     If you need a refill on your cardiac medications before your next appointment, please call your pharmacy.

## 2016-02-13 NOTE — Progress Notes (Signed)
Patient Care Team: Rinaldo Cloud, MD as PCP - General (Internal Medicine)   HPI  Parker Johnson is a 50 y.o. male Is seen in followup for ICD implantation for congestive heart failure and nonischemic cardiomyopathy. He was part of the [id number] RiT trial   He has a narrow QRS HE  received a dual-chamber device without left ventricular resynchronization   He was hospitalized in April 2015 with acute on chronic heart failure. It is noted that he has a history of atrial fibrillation with embolism to the arm in 2014. He is on warfarin.   He has had recurrent Vf over the last 4 months with xhock x 2 and aborted X 1   Has been hospitalized 2.17    Past Medical History  Diagnosis Date  . Ventricular tachycardia (HCC)   . AICD (automatic cardioverter/defibrillator) present     [employer] 100  . Tobacco abuse   . Alcohol abuse     hx  . Cardiomyopathy     secondary  . HTN (hypertension)     uncontrolled  . CHF (congestive heart failure) (HCC)   . Gout     Past Surgical History  Procedure Laterality Date  . Cardiac defibrillator placement  2010    [employer] 100  . Embolectomy Right 06/04/2013    Procedure: Upper Extremity Thrombectomy;  Surgeon: Nada Libman, MD;  Location: [facility] OR;  Service: Vascular;  Laterality: Right;    Current Outpatient Prescriptions  Medication Sig Dispense Refill  . allopurinol (ZYLOPRIM) 100 MG tablet Take 100 mg by mouth daily.    . carvedilol (COREG) 6.25 MG tablet Take 1 tablet (6.25 mg total) by mouth 2 (two) times daily with a meal. 60 tablet 3  . digoxin (LANOXIN) 0.25 MG tablet Take 0.25 mg by mouth daily.    . nicotine (NICODERM CQ - DOSED IN MG/24 HOURS) 14 mg/24hr patch Place 1 patch (14 mg total) onto the skin daily. 28 patch 0  . sacubitril-valsartan (ENTRESTO) 24-26 MG Take 1 tablet by mouth 2 (two) times daily. 60 tablet 3  . torsemide (DEMADEX) 100 MG tablet Take 0.5 tablets (50 mg total) by mouth  2 (two) times daily. (Patient taking differently: Take 50-100 mg by mouth 2 (two) times daily. Normal dosage is 50mg  twice daily, but sometimes takes extra 50mg  as needed-up to including 100mg  daily as neede-for fluid and edema) 30 tablet 3  . warfarin (COUMADIN) 10 MG tablet 10 mg 5 days per week and 5 mg on Wednesday and Sunday 30 tablet 3   No current facility-administered medications for this visit.    No Known Allergies  Review of Systems negative except from HPI and PMH  Physical Exam BP 120/80 mmHg  Pulse 82  Ht 5\' 7"  (1.702 m)  Wt 204 lb 6.4 oz (92.715 kg)  BMI 32.01 kg/m2 Well developed and well nourished in no acute distress HENT normal E scleral and icterus clear Neck Supple JVP flat; carotids brisk and full Clear to ausculation Device pocket well healed; without hematoma or erythema.  There is no tethering Regular rate and rhythm, no murmurs gallops or rub Soft with active bowel sounds No clubbing cyanosis  Edema Alert and oriented, grossly normal motor and sensory function Skin Warm and Dry  ECG demonstrates sinus rhythm at 83 Intervals 15/10/39 Poor wave progression Left axis deviation  Assessment and  Plan  nonischemic cardiomyopathy  Paroxysmal atrial fibrillation  Ventricular fibrillation  Congestive heart  failure-chronic-systolic  Implantable defibrillator-[employer] The patient's device was interrogated.  The information was reviewed. No changes were made in the programming.     The patient has had recurrent ventricular fibrillation/tachycardia requiring shock therapy. He is unaware. He has been advised about driving. Given the recent hospitalization for heart failure in the ongoing electrical instability as illustrated by his device I spoke with Dr. [facility] we will begin him on amiodarone. We have discussed drug interactions and we will decrease his digoxin 0.25-0.125 mg daily and we will decrease his warfarin from 10 mg 5 days and 5 mg 2 days---10  mg 2 days and 5 mg 5 days. He will get his blood work checked next week.  We will need to do pulmonary function testing.   Last blood work including demonstrated potassium of 3.1. This raises the possibility also that hypokalemia could be contributing to his arrhythmia. The event that his potassium remains low, I would recommend that we use spironolactone to help protect him from hypokalemia  We will also check a TSH as we began amiodarone

## 2016-02-22 ENCOUNTER — Encounter (HOSPITAL_COMMUNITY): Payer: Medicaid Other

## 2016-02-27 ENCOUNTER — Other Ambulatory Visit: Payer: Self-pay | Admitting: *Deleted

## 2016-02-27 MED ORDER — AMIODARONE HCL 400 MG PO TABS: ORAL_TABLET | ORAL | Status: DC

## 2016-02-28 ENCOUNTER — Other Ambulatory Visit: Payer: Self-pay | Admitting: *Deleted

## 2016-02-28 MED ORDER — AMIODARONE HCL 200 MG PO TABS: 200.0000 mg | ORAL_TABLET | Freq: Every day | ORAL | Status: AC

## 2016-02-28 MED ORDER — AMIODARONE HCL 200 MG PO TABS: ORAL_TABLET | ORAL | Status: DC

## 2016-02-28 NOTE — Telephone Encounter (Signed)
Per patient, pharmacy has been attempting to contact the office in regards to the rx for amiodarone. Patient stated that they need an rx for the 200mg  as they do not carry the 400mg  and actually they just are getting the 200mg  back in stock.

## 2016-02-28 NOTE — Telephone Encounter (Signed)
refill 

## 2016-03-12 ENCOUNTER — Telehealth: Payer: Self-pay | Admitting: *Deleted

## 2016-03-12 ENCOUNTER — Encounter: Payer: Self-pay | Admitting: Internal Medicine

## 2016-03-12 NOTE — Telephone Encounter (Signed)
Per Dr. Graciela Husbands- added on to his schedule at 2:30pm tomorrow. Pt agreeable.

## 2016-03-12 NOTE — Telephone Encounter (Signed)
Spoke with patient about 3 shocks 03/09/16 and 03/10/16. Patient says he "blacked out" on Friday afternoon and Saturday night. He does not recall the episode at 2am 03/10/16. He verifies that he is taking his meds as prescribed. I advised him of his driving restrictions x 6 months per [place].  I advised him to seek treatment in the ER if he develops any dizziness or "blacking out". He verbalizes understanding. I will review episodes with Dr. Graciela Husbands and call the patients if any recommendations are made. He is agreeable with plan

## 2016-03-13 ENCOUNTER — Ambulatory Visit (INDEPENDENT_AMBULATORY_CARE_PROVIDER_SITE_OTHER): Payer: Medicaid Other | Admitting: Internal Medicine

## 2016-03-13 DIAGNOSIS — I4901 Ventricular fibrillation: Secondary | ICD-10-CM

## 2016-03-13 DIAGNOSIS — I429 Cardiomyopathy, unspecified: Secondary | ICD-10-CM

## 2016-03-13 DIAGNOSIS — I480 Paroxysmal atrial fibrillation: Secondary | ICD-10-CM

## 2016-03-13 DIAGNOSIS — Z95810 Presence of automatic (implantable) cardiac defibrillator: Secondary | ICD-10-CM

## 2016-03-13 DIAGNOSIS — I5022 Chronic systolic (congestive) heart failure: Secondary | ICD-10-CM

## 2016-03-13 NOTE — Progress Notes (Signed)
No show

## 2016-03-14 ENCOUNTER — Encounter: Payer: Self-pay | Admitting: Internal Medicine

## 2016-03-26 ENCOUNTER — Telehealth: Payer: Self-pay | Admitting: *Deleted

## 2016-03-26 NOTE — Telephone Encounter (Signed)
Attempted to call patient regarding recommendations.  LMTCB/sss

## 2016-03-26 NOTE — Telephone Encounter (Signed)
Spoke to patient about possible sx's of sleep apnea. Patient denies snoring or observed apnea, but he does admit to hypersomnolence. Patient is 5'7" and weighs 204 lbs.  I also informed patient about Dr.Klein's recommendation to follow up first available. Will defer scheduling to [facility].  I encouraged patient to call Dr.Harwani to inform him about episode. Patient stated that he talked to him about his ongoing episodes when he saw him just days ago. He said that at that time Dr.Harwani recommended that he restart the Amiodarone. Patient says that he didn't restart the amiodarone bc he was scared that it was going to cause him to "black out and he can not afford not to drive for 6 months". Again, I explained to him that the treated episodes of VT/VF is why he's restricted from driving. I told him that by taking the Amiodarone he could possibly prevent these episodes from recurring.   I told patient that we would get him in to see Dr.Klein who could discuss this in greater detail with him. I strongly urged him to keep the appointment.  I also reiterated the driving restriction. I strongly encouraged him not to drive until 60/45/40.  Patient voiced understanding of all information given.

## 2016-03-26 NOTE — Telephone Encounter (Signed)
Spoke to patient regarding ICD shock from 4/26. Patient denies any sx's of dizziness, CP, ShOB, or syncope during the time of episode. Patient stated that he discontinued his amiodarone roughly 2 weeks after seeing Dr.Klein in office due to the fatigue. I strongly encouraged him several times to call the office prior to stopping any medications in the future. I also informed patient that per [place] he is not supposed to drive x 6 months from the date of his last treated episode, which would be 09/20/16. Patient voiced understanding of information.  I spoke to Dr.Klein about treated episode. Dr.Klein recommends that patient be questioned about potential sx's r/t sleep apnea and that he follow up with him in the office first available.

## 2016-04-11 ENCOUNTER — Encounter: Payer: Medicaid Other | Admitting: Internal Medicine

## 2016-04-12 ENCOUNTER — Encounter: Payer: Self-pay | Admitting: Internal Medicine

## 2016-04-17 ENCOUNTER — Ambulatory Visit (INDEPENDENT_AMBULATORY_CARE_PROVIDER_SITE_OTHER): Payer: Medicaid Other | Admitting: *Deleted

## 2016-04-17 DIAGNOSIS — I428 Other cardiomyopathies: Secondary | ICD-10-CM

## 2016-04-17 DIAGNOSIS — Z95810 Presence of automatic (implantable) cardiac defibrillator: Secondary | ICD-10-CM | POA: Diagnosis not present

## 2016-04-17 DIAGNOSIS — I429 Cardiomyopathy, unspecified: Secondary | ICD-10-CM | POA: Diagnosis not present

## 2016-04-20 NOTE — Progress Notes (Signed)
Remote ICD transmission.   

## 2016-05-06 LAB — CUP PACEART REMOTE DEVICE CHECK
Implantable Lead Implant Date: 20100923
Implantable Lead Location: 753859
Implantable Lead Model: 158
Implantable Lead Model: 5076
Implantable Lead Serial Number: 183379
Lead Channel Setting Pacing Amplitude: 2 V
Lead Channel Setting Pacing Amplitude: 2.4 V
Lead Channel Setting Sensing Sensitivity: 0.6 mV
MDC IDC LEAD IMPLANT DT: 20100923
MDC IDC LEAD LOCATION: 753860
MDC IDC PG SERIAL: 143406
MDC IDC SESS DTM: 20170611120800
MDC IDC SET LEADCHNL RV PACING PULSEWIDTH: 0.4 ms

## 2016-05-13 ENCOUNTER — Inpatient Hospital Stay (HOSPITAL_COMMUNITY)
Admission: EM | Admit: 2016-05-13 | Discharge: 2016-05-18 | DRG: 291 | Disposition: A | Discharge status: Home / Self Care | Payer: Medicaid Other | Attending: Cardiovascular Disease | Admitting: Cardiovascular Disease

## 2016-05-13 ENCOUNTER — Emergency Department (HOSPITAL_COMMUNITY): Payer: Medicaid Other

## 2016-05-13 ENCOUNTER — Encounter (HOSPITAL_COMMUNITY): Payer: Self-pay | Admitting: Emergency Medicine

## 2016-05-13 ENCOUNTER — Other Ambulatory Visit: Payer: Self-pay

## 2016-05-13 DIAGNOSIS — R570 Cardiogenic shock: Secondary | ICD-10-CM | POA: Diagnosis present

## 2016-05-13 DIAGNOSIS — I472 Ventricular tachycardia: Secondary | ICD-10-CM | POA: Diagnosis present

## 2016-05-13 DIAGNOSIS — Z95810 Presence of automatic (implantable) cardiac defibrillator: Secondary | ICD-10-CM | POA: Diagnosis not present

## 2016-05-13 DIAGNOSIS — R778 Other specified abnormalities of plasma proteins: Secondary | ICD-10-CM | POA: Diagnosis present

## 2016-05-13 DIAGNOSIS — I480 Paroxysmal atrial fibrillation: Secondary | ICD-10-CM | POA: Diagnosis present

## 2016-05-13 DIAGNOSIS — I420 Dilated cardiomyopathy: Secondary | ICD-10-CM | POA: Diagnosis present

## 2016-05-13 DIAGNOSIS — M109 Gout, unspecified: Secondary | ICD-10-CM | POA: Diagnosis present

## 2016-05-13 DIAGNOSIS — I509 Heart failure, unspecified: Secondary | ICD-10-CM

## 2016-05-13 DIAGNOSIS — Z7901 Long term (current) use of anticoagulants: Secondary | ICD-10-CM

## 2016-05-13 DIAGNOSIS — F17210 Nicotine dependence, cigarettes, uncomplicated: Secondary | ICD-10-CM | POA: Diagnosis present

## 2016-05-13 DIAGNOSIS — N183 Chronic kidney disease, stage 3 (moderate): Secondary | ICD-10-CM | POA: Diagnosis present

## 2016-05-13 DIAGNOSIS — R0602 Shortness of breath: Secondary | ICD-10-CM | POA: Diagnosis present

## 2016-05-13 DIAGNOSIS — I252 Old myocardial infarction: Secondary | ICD-10-CM

## 2016-05-13 DIAGNOSIS — I5023 Acute on chronic systolic (congestive) heart failure: Secondary | ICD-10-CM | POA: Diagnosis present

## 2016-05-13 DIAGNOSIS — E876 Hypokalemia: Secondary | ICD-10-CM | POA: Diagnosis present

## 2016-05-13 DIAGNOSIS — I130 Hypertensive heart and chronic kidney disease with heart failure and stage 1 through stage 4 chronic kidney disease, or unspecified chronic kidney disease: Principal | ICD-10-CM | POA: Diagnosis present

## 2016-05-13 DIAGNOSIS — Z79899 Other long term (current) drug therapy: Secondary | ICD-10-CM | POA: Diagnosis not present

## 2016-05-13 DIAGNOSIS — N179 Acute kidney failure, unspecified: Secondary | ICD-10-CM | POA: Diagnosis present

## 2016-05-13 DIAGNOSIS — R079 Chest pain, unspecified: Secondary | ICD-10-CM | POA: Diagnosis present

## 2016-05-13 DIAGNOSIS — I4891 Unspecified atrial fibrillation: Secondary | ICD-10-CM | POA: Diagnosis present

## 2016-05-13 HISTORY — DX: Long term (current) use of anticoagulants: Z79.01

## 2016-05-13 LAB — CBC WITH DIFFERENTIAL/PLATELET
BASOS ABS: 0 10*3/uL (ref 0.0–0.1)
BASOS PCT: 1 %
Eosinophils Absolute: 0.1 10*3/uL (ref 0.0–0.7)
Eosinophils Relative: 1 %
HEMATOCRIT: 42.9 % (ref 39.0–52.0)
HEMOGLOBIN: 14.5 g/dL (ref 13.0–17.0)
Lymphocytes Relative: 24 %
Lymphs Abs: 1.5 10*3/uL (ref 0.7–4.0)
MCH: 28.4 pg (ref 26.0–34.0)
MCHC: 33.8 g/dL (ref 30.0–36.0)
MCV: 84.1 fL (ref 78.0–100.0)
Monocytes Absolute: 0.3 10*3/uL (ref 0.1–1.0)
Monocytes Relative: 5 %
NEUTROS ABS: 4.2 10*3/uL (ref 1.7–7.7)
NEUTROS PCT: 69 %
Platelets: 158 10*3/uL (ref 150–400)
RBC: 5.1 MIL/uL (ref 4.22–5.81)
RDW: 20.8 % — ABNORMAL HIGH (ref 11.5–15.5)
WBC: 6.1 10*3/uL (ref 4.0–10.5)

## 2016-05-13 LAB — BASIC METABOLIC PANEL
ANION GAP: 17 — AB (ref 5–15)
ANION GAP: 8 (ref 5–15)
Anion gap: 10 (ref 5–15)
BUN: 34 mg/dL — ABNORMAL HIGH (ref 6–20)
BUN: 34 mg/dL — ABNORMAL HIGH (ref 6–20)
BUN: 32 mg/dL — AB (ref 6–20)
CALCIUM: 9.2 mg/dL (ref 8.9–10.3)
CALCIUM: 8.7 mg/dL — AB (ref 8.9–10.3)
CALCIUM: 7.9 mg/dL — AB (ref 8.9–10.3)
CHLORIDE: 86 mmol/L — AB (ref 101–111)
CO2: 31 mmol/L (ref 22–32)
CO2: 33 mmol/L — AB (ref 22–32)
CO2: 31 mmol/L (ref 22–32)
CREATININE: 1.58 mg/dL — AB (ref 0.61–1.24)
Chloride: 90 mmol/L — ABNORMAL LOW (ref 101–111)
Chloride: 96 mmol/L — ABNORMAL LOW (ref 101–111)
Creatinine, Ser: 1.7 mg/dL — ABNORMAL HIGH (ref 0.61–1.24)
Creatinine, Ser: 1.45 mg/dL — ABNORMAL HIGH (ref 0.61–1.24)
GFR calc Af Amer: >60 mL/min (ref >60)
GFR calc non Af Amer: 46 mL/min — ABNORMAL LOW (ref >60)
GFR calc non Af Amer: 50 mL/min — ABNORMAL LOW (ref >60)
GFR, EST AFRICAN AMERICAN: 53 mL/min — AB (ref >60)
GFR, EST AFRICAN AMERICAN: 58 mL/min — AB (ref >60)
GFR, EST NON AFRICAN AMERICAN: 55 mL/min — AB (ref >60)
GLUCOSE: 150 mg/dL — AB (ref 65–99)
GLUCOSE: 128 mg/dL — AB (ref 65–99)
Glucose, Bld: 127 mg/dL — ABNORMAL HIGH (ref 65–99)
POTASSIUM: 2.8 mmol/L — AB (ref 3.5–5.1)
Potassium: 3.2 mmol/L — ABNORMAL LOW (ref 3.5–5.1)
Potassium: 3.1 mmol/L — ABNORMAL LOW (ref 3.5–5.1)
Sodium: 134 mmol/L — ABNORMAL LOW (ref 135–145)
Sodium: 133 mmol/L — ABNORMAL LOW (ref 135–145)
Sodium: 135 mmol/L (ref 135–145)

## 2016-05-13 LAB — DIGOXIN LEVEL

## 2016-05-13 LAB — PROTIME-INR
INR: 3.46 — ABNORMAL HIGH (ref 0.00–1.49)
PROTHROMBIN TIME: 34.1 s — AB (ref 11.6–15.2)

## 2016-05-13 LAB — BRAIN NATRIURETIC PEPTIDE: B Natriuretic Peptide: 3255.2 pg/mL — ABNORMAL HIGH (ref 0.0–100.0)

## 2016-05-13 LAB — CARBOXYHEMOGLOBIN
CARBOXYHEMOGLOBIN: 1.3 % (ref 0.5–1.5)
METHEMOGLOBIN: 0.9 % (ref 0.0–1.5)
O2 Saturation: 51 %
TOTAL HEMOGLOBIN: 13 g/dL — AB (ref 13.5–18.0)

## 2016-05-13 LAB — MAGNESIUM: MAGNESIUM: 1.8 mg/dL (ref 1.7–2.4)

## 2016-05-13 LAB — TSH: TSH: 4.085 u[IU]/mL (ref 0.350–4.500)

## 2016-05-13 LAB — MRSA PCR SCREENING: MRSA BY PCR: NEGATIVE

## 2016-05-13 MED ORDER — ZOLPIDEM TARTRATE 5 MG PO TABS: 5.0000 mg | ORAL_TABLET | Freq: Every evening | ORAL | Status: DC | PRN

## 2016-05-13 MED ORDER — HYDRALAZINE HCL 25 MG PO TABS
12.5000 mg | ORAL_TABLET | Freq: Three times a day (TID) | ORAL | Status: DC
  Administered 2016-05-13 – 2016-05-14 (×3): 12.5 mg via ORAL
  Filled 2016-05-13 (×3): qty 1

## 2016-05-13 MED ORDER — CARVEDILOL 12.5 MG PO TABS: 6.2500 mg | ORAL_TABLET | Freq: Two times a day (BID) | ORAL | Status: DC

## 2016-05-13 MED ORDER — DIGOXIN 125 MCG PO TABS
0.2500 mg | ORAL_TABLET | Freq: Once | ORAL | Status: AC
  Administered 2016-05-13: 0.25 mg via ORAL
  Filled 2016-05-13: qty 2

## 2016-05-13 MED ORDER — POTASSIUM CHLORIDE CRYS ER 20 MEQ PO TBCR
40.0000 meq | EXTENDED_RELEASE_TABLET | Freq: Once | ORAL | Status: AC
  Administered 2016-05-13: 40 meq via ORAL
  Filled 2016-05-13: qty 2

## 2016-05-13 MED ORDER — SODIUM CHLORIDE 0.9% FLUSH
3.0000 mL | Freq: Two times a day (BID) | INTRAVENOUS | Status: DC
  Administered 2016-05-13 – 2016-05-18 (×7): 3 mL via INTRAVENOUS

## 2016-05-13 MED ORDER — SODIUM CHLORIDE 0.9% FLUSH: 3.0000 mL | INTRAVENOUS | Status: DC | PRN

## 2016-05-13 MED ORDER — SPIRONOLACTONE 25 MG PO TABS
12.5000 mg | ORAL_TABLET | Freq: Every day | ORAL | Status: DC
  Administered 2016-05-13: 12.5 mg via ORAL
  Filled 2016-05-13: qty 1

## 2016-05-13 MED ORDER — ALBUTEROL SULFATE (2.5 MG/3ML) 0.083% IN NEBU
5.0000 mg | INHALATION_SOLUTION | Freq: Once | RESPIRATORY_TRACT | Status: AC
  Administered 2016-05-13: 5 mg via RESPIRATORY_TRACT
  Filled 2016-05-13: qty 6

## 2016-05-13 MED ORDER — ACETAMINOPHEN 325 MG PO TABS
650.0000 mg | ORAL_TABLET | ORAL | Status: DC | PRN
  Administered 2016-05-13 – 2016-05-17 (×6): 650 mg via ORAL
  Filled 2016-05-13 (×6): qty 2

## 2016-05-13 MED ORDER — POTASSIUM CHLORIDE CRYS ER 20 MEQ PO TBCR
40.0000 meq | EXTENDED_RELEASE_TABLET | Freq: Three times a day (TID) | ORAL | Status: DC
  Administered 2016-05-13 (×3): 40 meq via ORAL
  Filled 2016-05-13 (×2): qty 2

## 2016-05-13 MED ORDER — SODIUM CHLORIDE 0.9 % IV SOLN: 250.0000 mL | INTRAVENOUS | Status: DC | PRN

## 2016-05-13 MED ORDER — ONDANSETRON HCL 4 MG/2ML IJ SOLN: 4.0000 mg | Freq: Four times a day (QID) | INTRAMUSCULAR | Status: DC | PRN

## 2016-05-13 MED ORDER — FUROSEMIDE 10 MG/ML IJ SOLN
100.0000 mg | Freq: Two times a day (BID) | INTRAVENOUS | Status: DC
  Administered 2016-05-13 – 2016-05-15 (×4): 100 mg via INTRAVENOUS
  Filled 2016-05-13 (×7): qty 10

## 2016-05-13 MED ORDER — NICOTINE 14 MG/24HR TD PT24
14.0000 mg | MEDICATED_PATCH | Freq: Every day | TRANSDERMAL | Status: DC
  Administered 2016-05-13 – 2016-05-18 (×6): 14 mg via TRANSDERMAL
  Filled 2016-05-13 (×6): qty 1

## 2016-05-13 MED ORDER — SODIUM CHLORIDE 0.9% FLUSH
10.0000 mL | Freq: Two times a day (BID) | INTRAVENOUS | Status: DC
  Administered 2016-05-13 – 2016-05-17 (×8): 10 mL
  Administered 2016-05-17: 20 mL
  Administered 2016-05-18: 10 mL

## 2016-05-13 MED ORDER — FUROSEMIDE 10 MG/ML IJ SOLN
40.0000 mg | Freq: Once | INTRAMUSCULAR | Status: AC
  Administered 2016-05-13: 40 mg via INTRAVENOUS
  Filled 2016-05-13: qty 4

## 2016-05-13 MED ORDER — SODIUM CHLORIDE 0.9% FLUSH
10.0000 mL | INTRAVENOUS | Status: DC | PRN
Start: 2016-05-13 — End: 2016-05-18

## 2016-05-13 MED ORDER — FUROSEMIDE 10 MG/ML IJ SOLN
60.0000 mg | Freq: Once | INTRAMUSCULAR | Status: AC
  Administered 2016-05-13: 60 mg via INTRAVENOUS
  Filled 2016-05-13: qty 6

## 2016-05-13 MED ORDER — METOLAZONE 5 MG PO TABS
5.0000 mg | ORAL_TABLET | Freq: Once | ORAL | Status: AC
  Administered 2016-05-13: 5 mg via ORAL
  Filled 2016-05-13: qty 1

## 2016-05-13 MED ORDER — POTASSIUM CHLORIDE CRYS ER 20 MEQ PO TBCR: 40.0000 meq | EXTENDED_RELEASE_TABLET | Freq: Once | ORAL | Status: DC

## 2016-05-13 MED ORDER — ALLOPURINOL 100 MG PO TABS
100.0000 mg | ORAL_TABLET | Freq: Every day | ORAL | Status: DC
  Administered 2016-05-13 – 2016-05-18 (×6): 100 mg via ORAL
  Filled 2016-05-13 (×6): qty 1

## 2016-05-13 MED ORDER — MILRINONE LACTATE IN DEXTROSE 20-5 MG/100ML-% IV SOLN
0.3750 ug/kg/min | INTRAVENOUS | Status: DC
  Administered 2016-05-13 – 2016-05-14 (×2): 0.25 ug/kg/min via INTRAVENOUS
  Administered 2016-05-14 – 2016-05-17 (×7): 0.375 ug/kg/min via INTRAVENOUS
  Filled 2016-05-13 (×8): qty 100

## 2016-05-13 MED ORDER — ALPRAZOLAM 0.25 MG PO TABS: 0.2500 mg | ORAL_TABLET | Freq: Two times a day (BID) | ORAL | Status: DC | PRN

## 2016-05-13 MED ORDER — AMIODARONE HCL 200 MG PO TABS
200.0000 mg | ORAL_TABLET | Freq: Every day | ORAL | Status: DC
  Administered 2016-05-13 – 2016-05-18 (×6): 200 mg via ORAL
  Filled 2016-05-13 (×6): qty 1

## 2016-05-13 NOTE — Progress Notes (Signed)
  ANTICOAGULATION CONSULT NOTE - Initial Consult  Pharmacy Consult for Warfarin Indication: atrial fibrillation, and LV thrombus  Assessment: 50 yo with history of CAD - VT requiring AICD, Cardiomyopathy with CHF, HTN, Afib and LV thrombus.  He also has a history of hypertension, alcohol and tobacco use.  His INR today is 3.46 and his last dose was 6/17 at 7PM.  HOME dose:  Takes 10mg  on Wed/Sun and 5mg  all other days  Drug/Drug Interactions: Allopurinol + Warfarin = The hypoprothrombinemic effect of warfarin may be increased by allopurinol. Amiodarone + Warfarin = amiodarone may inhibit hepatic metabolism and increase the anticoagulant effect of warfarin.   Atrial Fibrillation CHADS(2) Score for Stroke Risk Select Criteria:  Diagnosed heart failure, past or current (1 point)  Hypertension treated or untreated (1 point)  Age >= 75 years (1 point)  Diabetes Mellitus (1 point)  Secondary prevention in patients with prior ischemic stroke, TIA or thromboembolism (2 points) Results: Total Criteria Point Count:   2 Reset Form Stroke Risk per 100 Person Years/Warfarin Rx Interpretation 0 Points: 0.25 ON Rx;  0.49 NO Rx 1 Point: 0.72 ON Rx;   1.52 NO Rx 2 Points: 1.27 ON Rx;  2.50 No Rx 3 Points: 2.20 ON Rx;  5.27 NO Rx 4 Points: 2.35 ON Rx;  6.02 NO Rx 5-6 Points: 4.60 ON Rx;  6.88 NO Rx  No Known Allergies  Patient Measurements: Height: 5\' 7"  (170.2 cm) Weight: 207 lb 2 oz (93.951 kg) IBW/kg (Calculated) : 66.1  Vital Signs: Temp: 97.6 F (36.4 C) (06/18 0658) Temp Source: Oral (06/18 0658) BP: 132/80 mmHg (06/18 1200) Pulse Rate: 90 (06/18 1200)  Labs:  Recent Labs  05/13/16 0700  HGB 14.5  HCT 42.9  PLT 158  LABPROT 34.1*  INR 3.46*  CREATININE 1.70*   Estimated Creatinine Clearance: 57.5 mL/min (by C-G formula based on Cr of 1.7).  Medical History: Past Medical History  Diagnosis Date  . Ventricular tachycardia (HCC)   . AICD (automatic  cardioverter/defibrillator) present     [employer] 100  . Tobacco abuse   . Alcohol abuse     hx  . Cardiomyopathy     secondary  . HTN (hypertension)     uncontrolled  . CHF (congestive heart failure) (HCC)   . Gout   . Chronic anticoagulation     Coumadin for hx LV thrombus and PAF   Goal of Therapy:  INR 2-3 Monitor platelets by anticoagulation protocol: Yes   Plan:  - Hold Warfarin today  - Check PT/INR in AM and re-dose - Monitor for s/s of bleeding   Nadara Mustard, PharmD., MS Clinical Pharmacist Pager:  [phone] Thank you for allowing pharmacy to be part of this patients care team. 05/13/2016,12:41 PM

## 2016-05-13 NOTE — ED Notes (Signed)
Bensimhon, MD changing level of care. Do not transport to 3E.

## 2016-05-13 NOTE — Progress Notes (Signed)
Peripherally Inserted Central Catheter/Midline Placement  The IV Nurse has discussed with the patient and/or persons authorized to consent for the patient, the purpose of this procedure and the potential benefits and risks involved with this procedure.  The benefits include less needle sticks, lab draws from the catheter and patient may be discharged home with the catheter.  Risks include, but not limited to, infection, bleeding, blood clot (thrombus formation), and puncture of an artery; nerve damage and irregular heat beat.  Alternatives to this procedure were also discussed.  PICC/Midline Placement Documentation  PICC Triple Lumen 05/13/16 PICC Right Brachial 39 cm 0 cm (Active)  Indication for Insertion or Continuance of Line Vasoactive infusions 05/13/2016  3:10 PM  Exposed Catheter (cm) 0 cm 05/13/2016  3:10 PM  Site Assessment Clean;Dry;Intact 05/13/2016  3:10 PM  Lumen #1 Status Flushed;Saline locked;Blood return noted 05/13/2016  3:10 PM  Lumen #2 Status Flushed;Saline locked;Blood return noted 05/13/2016  3:10 PM  Lumen #3 Status Flushed;Saline locked;Blood return noted 05/13/2016  3:10 PM  Dressing Type Transparent 05/13/2016  3:10 PM  Dressing Status Clean;Dry;Intact 05/13/2016  3:10 PM  Dressing Change Due 05/20/16 05/13/2016  3:10 PM       [name] 05/13/2016, 3:11 PM

## 2016-05-13 NOTE — ED Notes (Signed)
From home with c/o sob and edema in legs and scrotum and penis.  Hx of CHF.  States it started about 2 weeks ago but getting worse.

## 2016-05-13 NOTE — Progress Notes (Signed)
   PIC Line placed. CVP 29. Co-ox 50% suggestive of low output. Start milrinone. Continue diuresis.   Janesha Brissette,MD 5:42 PM

## 2016-05-13 NOTE — Progress Notes (Signed)
Ref: Rinaldo Cloud, MD   Subjective:  Feeling better. Had good response to ted hose use last time. On milrinone drip.  Objective:  Vital Signs in the last 24 hours: Temp:  [97.6 F (36.4 C)] 97.6 F (36.4 C) (06/18 1700) Pulse Rate:  [32-101] 94 (06/18 1715) Cardiac Rhythm:  [-] Normal sinus rhythm (06/18 1900) Resp:  [14-39] 28 (06/18 1800) BP: (93-132)/(73-100) 97/84 mmHg (06/18 1800) SpO2:  [26 %-100 %] 97 % (06/18 1715) Weight:  [93.895 kg (207 lb)-93.951 kg (207 lb 2 oz)] 93.895 kg (207 lb) (06/18 1550)  Physical Exam: BP Readings from Last 1 Encounters:  05/13/16 97/84    Wt Readings from Last 1 Encounters:  05/13/16 93.895 kg (207 lb)    Weight change:   HEENT: [place]/AT, Eyes-Brown, PERL, EOMI, Conjunctiva-Pink, Sclera-Non-icteric Neck: + JVD, No bruit, Trachea midline. Lungs:  Clearing, Bilateral. Cardiac:  Regular rhythm, normal S1 and S2, no S3. II/VI systolic murmur Abdomen:  Soft, non-tender. Extremities:  2 + edema present. No cyanosis. No clubbing. CNS: AxOx3, Cranial nerves grossly intact, moves all 4 extremities. Right handed. Skin: Warm and dry.   Intake/Output from previous day:      Lab Results: BMET    Component Value Date/Time   NA 135 05/13/2016 1645   NA 133* 05/13/2016 1530   NA 134* 05/13/2016 0700   K 3.1* 05/13/2016 1645   K 3.2* 05/13/2016 1530   K 2.8* 05/13/2016 0700   CL 96* 05/13/2016 1645   CL 90* 05/13/2016 1530   CL 86* 05/13/2016 0700   CO2 31 05/13/2016 1645   CO2 33* 05/13/2016 1530   CO2 31 05/13/2016 0700   GLUCOSE 128* 05/13/2016 1645   GLUCOSE 150* 05/13/2016 1530   GLUCOSE 127* 05/13/2016 0700   BUN 32* 05/13/2016 1645   BUN 34* 05/13/2016 1530   BUN 34* 05/13/2016 0700   CREATININE 1.45* 05/13/2016 1645   CREATININE 1.58* 05/13/2016 1530   CREATININE 1.70* 05/13/2016 0700   CREATININE 1.08 02/13/2016 1115   CALCIUM 7.9* 05/13/2016 1645   CALCIUM 8.7* 05/13/2016 1530   CALCIUM 9.2 05/13/2016 0700   GFRNONAA  55* 05/13/2016 1645   GFRNONAA 50* 05/13/2016 1530   GFRNONAA 46* 05/13/2016 0700   GFRAA >60 05/13/2016 1645   GFRAA 58* 05/13/2016 1530   GFRAA 53* 05/13/2016 0700   CBC    Component Value Date/Time   WBC 6.1 05/13/2016 0700   RBC 5.10 05/13/2016 0700   HGB 14.5 05/13/2016 0700   HCT 42.9 05/13/2016 0700   PLT 158 05/13/2016 0700   MCV 84.1 05/13/2016 0700   MCH 28.4 05/13/2016 0700   MCHC 33.8 05/13/2016 0700   RDW 20.8* 05/13/2016 0700   LYMPHSABS 1.5 05/13/2016 0700   MONOABS 0.3 05/13/2016 0700   EOSABS 0.1 05/13/2016 0700   BASOSABS 0.0 05/13/2016 0700   HEPATIC Function Panel  Recent Labs  05/19/15 1950 12/29/15 0001  PROT 7.2 7.3   HEMOGLOBIN A1C No components found for: HGA1C,  MPG CARDIAC ENZYMES Lab Results  Component Value Date   CKTOTAL 107 06/04/2012   CKMB 1.9 06/04/2012   TROPONINI 0.04* 12/29/2015   TROPONINI 0.06* 12/29/2015   TROPONINI 0.07* 12/29/2015   BNP No results for input(s): PROBNP in the last 8760 hours. TSH  Recent Labs  12/29/15 0050 02/13/16 1115 05/13/16 1210  TSH 4.823* 2.07 4.085   CHOLESTEROL No results for input(s): CHOL in the last 8760 hours.  Scheduled Meds: . allopurinol  100 mg Oral Daily  .  amiodarone  200 mg Oral Daily  . furosemide  100 mg Intravenous BID  . hydrALAZINE  12.5 mg Oral Q8H  . nicotine  14 mg Transdermal Daily  . potassium chloride  40 mEq Oral TID  . potassium chloride  40 mEq Oral Once  . sodium chloride flush  10-40 mL Intracatheter Q12H  . sodium chloride flush  3 mL Intravenous Q12H  . spironolactone  12.5 mg Oral Daily   Continuous Infusions: . 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 Apply Ted hose. Appreciate Dr. Prescott Gum care. Dr. Sharyn Lull to resume care in AM.   LOS: 0 days    Orpah Cobb  MD  05/13/2016, 7:36 PM

## 2016-05-13 NOTE — H&P (Addendum)
History and Physical   Patient ID: Parker Johnson MRN: [id number], DOB/AGE: 1965/12/16 50 y.o. Date of Encounter: 05/13/2016  Primary Physician: Rinaldo Cloud, MD Primary Cardiologist: Dr Sharyn Lull  EP: Dr Graciela Husbands  Chief Complaint:  SOB, extra fluid  HPI: Parker Johnson is a 50 y.o. male with a history of NICM (EF 10% 2015), S-CHF, BSci ICD (part of the MADIT- RiT trial). Dr Sharyn Lull may have a more recent echo, no currently available.  [name], does not feel like he has been over-drinking. Compliant with meds, but ran out of Digoxin 8 days ago and did not have the money to get it filled.  Says he has struggled with severe HF for over 7 years. Has h/o ETOH and tobacco use. Quit drinking at least 3 months ago. Trying to quit smoking so he can "get on the list."  He has noticed a slow weight gain, only 5 lbs on his home scales. However, he describes increasing DOE, orthopnea and PND. The PND has been worse over the last week or 2. LE edema and abd size have been increasing. He was up most of the night, only able to get a little rest in a chair. His symptoms have worsened to the point that he was SOB at rest. He came to the ER and feels a little better after IV Lasix 60 mg and on O2.   Past Medical History  Diagnosis Date  . Ventricular tachycardia (HCC)   . AICD (automatic cardioverter/defibrillator) present     [employer] 100  . Tobacco abuse   . Alcohol abuse     hx  . Cardiomyopathy     secondary  . HTN (hypertension)     uncontrolled  . CHF (congestive heart failure) (HCC)   . Gout   . Chronic anticoagulation     Coumadin for hx LV thrombus and PAF    Surgical History:  Past Surgical History  Procedure Laterality Date  . Cardiac defibrillator placement  2010    [employer] 100  . Embolectomy Right 06/04/2013    Procedure: Upper Extremity Thrombectomy;  Surgeon: Nada Libman, MD;  Location: [facility] OR;  Service: Vascular;  Laterality:  Right;     I have reviewed the patient's current medications. Medication Sig  allopurinol (ZYLOPRIM) 100 MG tablet Take 100 mg by mouth daily.  amiodarone (PACERONE) 200 MG tablet Take two tablets (400 mg) by mouth twice daily x 2 weeks, then decrease to two tablets (400 mg) by mouth once daily x 2 weeks Patient taking differently: Take 400 mg by mouth daily.   carvedilol (COREG) 6.25 MG tablet Take 1 tablet (6.25 mg total) by mouth 2 (two) times daily with a meal.  digoxin (LANOXIN) 0.25 MG tablet Take 1/2 tablet (125 mcg) once daily  torsemide (DEMADEX) 100 MG tablet Take 0.5 tablets (50 mg total) by mouth 2 (two) times daily. Patient taking differently: Take 50-100 mg by mouth 2 (two) times daily. Normal dosage is 50mg  twice daily, but sometimes takes extra 50mg  as needed-up to including 100mg  daily as neede-for fluid and edema  warfarin (COUMADIN) 10 MG tablet Take 1/2 tablet (5 mg) every day except one whole tablet (10 mg) on Wednesdays & Sundays  amiodarone (PACERONE) 200 MG tablet Take 1 tablet (200 mg total) by mouth daily. Patient not taking: Reported on 05/13/2016  nicotine (NICODERM CQ - DOSED IN MG/24 HOURS) 14 mg/24hr patch Place 1 patch (14 mg total)  onto the skin daily. Patient not taking: Reported on 05/13/2016  sacubitril-valsartan (ENTRESTO) 24-26 MG Take 1 tablet by mouth 2 (two) times daily. Patient not taking: Reported on 05/13/2016   Scheduled Meds: Continuous Infusions: PRN Meds:.  Allergies: No Known Allergies  Social History   Social History  . Marital Status: Married    Spouse Name: N/A  . Number of Children: N/A  . Years of Education: N/A   Occupational History  . Disabled    Social History Main Topics  . Smoking status: Current Some Day Smoker -- 0.50 packs/day for 30 years    Types: Cigarettes  . Smokeless tobacco: Never Used     Comment: I already have information on quitting  . Alcohol Use: 0.6 oz/week    1 Cans of beer per week     Comment:  None in 3 months  . Drug Use: No     Comment: history of cocaine use that is not very recent (1-2 yrs)  . Sexual Activity: Yes    Birth Control/ Protection: None   Other Topics Concern  . Not on file   Social History Narrative   Disabled. Did not finish the 12th grade but is interested in pursuing a GED.     Family History  Problem Relation Age of Onset  . Cardiomyopathy Father     on a defib  . Diabetes Father    Family Status  Relation Status Death Age  . Father Deceased 84  . Mother Alive     Review of Systems:   Full 14-point review of systems otherwise negative except as noted above.  Physical Exam: Blood pressure 118/85, pulse 100, temperature 97.6 F (36.4 C), temperature source Oral, resp. rate 18, height 5\' 7"  (1.702 m), weight 207 lb 2 oz (93.951 kg), SpO2 99 %. General: Well developed, well nourished,male in no acute distress. Head: Normocephalic, atraumatic, sclera + icteric, no xanthomas, nares are without discharge. Dentition: fair Neck: No carotid bruits. JVD elevated to ear . No thyromegally Lungs: Good expansion bilaterally. without wheezes or rhonchi. Bibasilar rales Heart: Regular rate and rhythm with S1 S2.  Loud s3  2/6 murmur, no rubs, or gallops appreciated. Abdomen: Soft but enlarged per pt, non-tender, + distended with normoactive bowel sounds. No hepatomegaly. No rebound/guarding. No obvious abdominal masses. Msk:  Strength and tone appear normal for age. No joint deformities or effusions, no spine or costo-vertebral angle tenderness. Extremities: No clubbing or cyanosis. 3+ edema.  + scrotal edema Neuro: Alert and oriented X 3. Moves all extremities spontaneously. No focal deficits noted. Psych:  Responds to questions appropriately with a normal affect. Skin: No rashes or lesions noted  Labs:   Lab Results  Component Value Date   WBC 6.1 05/13/2016   HGB 14.5 05/13/2016   HCT 42.9 05/13/2016   MCV 84.1 05/13/2016   PLT 158 05/13/2016     Recent Labs  05/13/16 0700  INR 3.46*     Recent Labs Lab 05/13/16 0700  NA 134*  K 2.8*  CL 86*  CO2 31  BUN 34*  CREATININE 1.70*  CALCIUM 9.2  GLUCOSE 127*   No results for input(s): CKTOTAL, CKMB, TROPONINI in the last 72 hours. No results for input(s): TROPIPOC in the last 72 hours. Lab Results  Component Value Date   CHOL 98 07/17/2011   HDL 19* 07/17/2011   LDLCALC 60 07/17/2011   TRIG 94 07/17/2011   B NATRIURETIC PEPTIDE  Date/Time Value Ref Range Status  05/13/2016  07:00 AM 3255.2* 0.0 - 100.0 pg/mL Final  12/31/2015 02:38 AM 2584.7* 0.0 - 100.0 pg/mL Final  12/29/2015 12:01 AM 4130.4* 0.0 - 100.0 pg/mL Final    Radiology/Studies: Dg Chest 2 View 05/13/2016  CLINICAL DATA:  Dyspnea. EXAM: CHEST  2 VIEW COMPARISON:  Radiograph of December 28, 2015. FINDINGS: Stable cardiomegaly. Left-sided pacemaker is unchanged in position. No pneumothorax or pleural effusion is noted. No acute pulmonary disease is noted. Bony thorax is unremarkable. IMPRESSION: Stable cardiomegaly.  No acute cardiopulmonary abnormality seen. Electronically Signed   By: Lupita Raider, M.D.   On: 05/13/2016 09:00     Cardiac Cath: 06/2010 EF 10%, +LV thrombus LAD 10-15%, no other plaque mentioned  Echo: 02/2014 - Left ventricle: The cavity size was moderately dilated. The estimated ejection fraction was 10%. Wall motion was normal; there were no regional wall motion abnormalities. - Left atrium: The atrium was mildly dilated. - Atrial septum: No defect or patent foramen ovale was identified. - Pulmonary arteries: PA peak pressure: 37mm Hg (S).  ECG: 06/18 SR, PVC seen  ASSESSMENT AND PLAN:  Principal Problem:   Acute on chronic systolic heart failure, NYHA class 4 (HCC) - per computer records, weight is only up 3 lbs since March, but he has significant extra volume on board. - admit for diuresis, PTA on Demadex 50 mg bid, start with Lasix 100 mg IV bid (pharmacy suggested  this dose) - follow I/O, daily weights, renal function - if problems with hypotension, may need milrinone  Active Problems:   PAROXYSMAL ATRIAL FIBRILLATION - keep on telemetry - Dig level 0.2, was given 0.25 mg dig in ER - with renal insufficiency, have to be careful with dosing - pt has been out of dig > 1 week - MD advise on restarting digoxin - decrease amio to 200 mg qd    Chronic anticoagulation - for hx LV thrombus and PAF - coumadin is therapeutic, per pharmacy    Hypokalemia - supplement with 40 meq x 2 doses now, recheck this pm and add more if needed - may need 40 meq qid    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  - BUN/Cr 01/2016 were 16/1.08 - now 34/1.70 - follow with diuresis  Will admit to Dr Algie Coffer  Signed, Parker Demark, PA-C 05/13/2016 11:41 AM Beeper [phone]  Patient seen and examined with Parker Demark, PA-C. We discussed all aspects of the encounter. I agree with the assessment and plan as stated above.   He has severe HF with class IV symptoms and marked volume overload. 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 Likely nearing the point where we will need to consider advanced HF therapies if he is a candidate. (Lives with friend but mother lives nearby. Recently stopped ETOH but still smoking cigarettes)  Agree with IV lasix. Will place PICC line to measure CVP and co-ox and assess need for inotropic support. Start hydralazine for afterload reduction. No ACE due to renal failure.  No b-blocker due to low output. Will repeat echo to assess both LV and RV function. Continue coumadin for AF and LV thrombus. Watch dosing with low output HF.   Will d/w Dr. Sharyn Lull in am and see if he would like the Advanced HF Team to assist with management.   Yader Criger,MD 12:47 PM

## 2016-05-13 NOTE — ED Provider Notes (Signed)
CSN: [id number]     Arrival date & time 05/13/16  4098 History   First MD Initiated Contact with Patient 05/13/16 [phone]     Chief Complaint  Patient presents with  . Shortness of Breath  . Leg Swelling     (Consider location/radiation/quality/duration/timing/severity/associated sxs/prior Treatment) HPI Patient presents with concern of diffuse swelling, ongoing dyspnea, fatigue. Symptoms have been developing over about 2 weeks, worse over the past few days. No focal pain, though there is generalized discomfort, including scrotal discomfort as he has had increasing swelling. He continues to take all medication as directed, including torsemide, amiodarone, Coumadin.  No clear precipitant for the patient's illness. No fever, cough.  Past Medical History  Diagnosis Date  . Ventricular tachycardia (HCC)   . AICD (automatic cardioverter/defibrillator) present     [employer]  . Tobacco abuse   . Alcohol abuse     hx  . Cardiomyopathy     secondary  . HTN (hypertension)     uncontrolled  . CHF (congestive heart failure) (HCC)   . Gout    Past Surgical History  Procedure Laterality Date  . Cardiac defibrillator placement  2010    [employer] 100  . Embolectomy Right 06/04/2013    Procedure: Upper Extremity Thrombectomy;  Surgeon: Nada Libman, MD;  Location: [facility] OR;  Service: Vascular;  Laterality: Right;   Family History  Problem Relation Age of Onset  . Cardiomyopathy Father     on a defib  . Diabetes Father    Social History  Substance Use Topics  . Smoking status: Current Some Day Smoker -- 0.50 packs/day for 30 years    Types: Cigarettes  . Smokeless tobacco: Never Used     Comment: I already have information on quitting"  . Alcohol Use: 0.6 oz/week    1 Cans of beer per week    Review of Systems  Constitutional:       Per HPI, otherwise negative  HENT:       Per HPI, otherwise negative  Respiratory:       Per HPI, otherwise  negative  Cardiovascular:       Per HPI, otherwise negative  Gastrointestinal: Negative for vomiting.  Endocrine:       Negative aside from HPI  Genitourinary:       Neg aside from HPI   Musculoskeletal:       Per HPI, otherwise negative  Skin: Negative.   Neurological: Positive for weakness. Negative for syncope.      Allergies  Review of patient's allergies indicates no known allergies.  Home Medications   Prior to Admission medications   Medication Sig Start Date End Date Taking? Authorizing Provider  allopurinol (ZYLOPRIM) 100 MG tablet Take 100 mg by mouth daily.   Yes Historical Provider, MD  amiodarone (PACERONE) 200 MG tablet Take two tablets (400 mg) by mouth twice daily x 2 weeks, then decrease to two tablets (400 mg) by mouth once daily x 2 weeks Patient taking differently: Take 400 mg by mouth daily.  02/28/16  Yes Duke Salvia, MD  carvedilol (COREG) 6.25 MG tablet Take 1 tablet (6.25 mg total) by mouth 2 (two) times daily with a meal. 05/23/15  Yes Rinaldo Cloud, MD  digoxin (LANOXIN) 0.25 MG tablet Take 1/2 tablet (125 mcg) once daily   Yes Historical Provider, MD  torsemide (DEMADEX) 100 MG tablet Take 0.5 tablets (50 mg total) by mouth 2 (two) times daily. Patient taking differently:  Take 50-100 mg by mouth 2 (two) times daily. Normal dosage is 50mg  twice daily, but sometimes takes extra 50mg  as needed-up to including 100mg  daily as neede-for fluid and edema 10/05/15  Yes Rinaldo Cloud, MD  warfarin (COUMADIN) 10 MG tablet Take 1/2 tablet (5 mg) every day except one whole tablet (10 mg) on Wednesdays & Sundays 02/13/16  Yes Duke Salvia, MD  amiodarone (PACERONE) 200 MG tablet Take 1 tablet (200 mg total) by mouth daily. Patient not taking: Reported on 05/13/2016 02/28/16   Duke Salvia, MD  nicotine (NICODERM CQ - DOSED IN MG/24 HOURS) 14 mg/24hr patch Place 1 patch (14 mg total) onto the skin daily. Patient not taking: Reported on 05/13/2016 01/01/16   Rinaldo Cloud,  MD  sacubitril-valsartan (ENTRESTO) 24-26 MG Take 1 tablet by mouth 2 (two) times daily. Patient not taking: Reported on 05/13/2016 01/01/16   Rinaldo Cloud, MD   BP 112/91 mmHg  Pulse 100  Temp(Src) 97.6 F (36.4 C) (Oral)  Resp 26  Ht 5\' 7"  (1.702 m)  Wt 207 lb 2 oz (93.951 kg)  BMI 32.43 kg/m2  SpO2 100% Physical Exam  Constitutional: He is oriented to person, place, and time. He appears well-developed. No distress.  HENT:  Head: Normocephalic and atraumatic.  Eyes: Conjunctivae and EOM are normal.  Cardiovascular: Normal rate and regular rhythm.   Pulmonary/Chest: Effort normal. No stridor. He has wheezes.  Abdominal: He exhibits no distension.  Genitourinary:  Diffuse scrotal and penis swelling, no discoloration, no tenderness to palpation  Musculoskeletal: He exhibits edema.  Diffuse edema both legs symmetric, pitting  Neurological: He is alert and oriented to person, place, and time.  Skin: Skin is warm and dry.  Psychiatric: He has a normal mood and affect.  Nursing note and vitals reviewed.   ED Course  Procedures (including critical care time) Labs Review Labs Reviewed  CBC WITH DIFFERENTIAL/PLATELET - Abnormal; Notable for the following:    RDW 20.8 (*)    All other components within normal limits  BASIC METABOLIC PANEL - Abnormal; Notable for the following:    Sodium 134 (*)    Potassium 2.8 (*)    Chloride 86 (*)    Glucose, Bld 127 (*)    BUN 34 (*)    Creatinine, Ser 1.70 (*)    GFR calc non Af Amer 46 (*)    GFR calc Af Amer 53 (*)    Anion gap 17 (*)    All other components within normal limits  BRAIN NATRIURETIC PEPTIDE - Abnormal; Notable for the following:    B Natriuretic Peptide 3255.2 (*)    All other components within normal limits  DIGOXIN LEVEL - Abnormal; Notable for the following:    Digoxin Level <0.2 (*)    All other components within normal limits  PROTIME-INR - Abnormal; Notable for the following:    Prothrombin Time 34.1 (*)     INR 3.46 (*)    All other components within normal limits    Imaging Review Dg Chest 2 View  05/13/2016  CLINICAL DATA:  Dyspnea. EXAM: CHEST  2 VIEW COMPARISON:  Radiograph of December 28, 2015. FINDINGS: Stable cardiomegaly. Left-sided pacemaker is unchanged in position. No pneumothorax or pleural effusion is noted. No acute pulmonary disease is noted. Bony thorax is unremarkable. IMPRESSION: Stable cardiomegaly.  No acute cardiopulmonary abnormality seen. Electronically Signed   By: Lupita Raider, M.D.   On: 05/13/2016 09:00   I have personally reviewed and evaluated  these images and lab results as part of my medical decision-making.   EKG Interpretation   Date/Time:  Sunday May 13 2016 06:50:46 EDT Ventricular Rate:  97 PR Interval:    QRS Duration: 107 QT Interval:  420 QTC Calculation: 534 R Axis:   -89 Text Interpretation:  Sinus arrhythmia Ventricular premature complex  Probable left atrial enlargement Abnormal lateral Q waves Anterior  infarct, old Prolonged QT interval No significant change since last  tracing Abnormal ekg Confirmed by Gerhard Munch  MD [phone]) on 05/13/2016  7:57:39 AM     Update: Patient remains dyspneic. Labs notable for acute kidney injury, negligible digoxin level, elevated BNP, all concerning for heart failure progression.  MDM  Patient with history of congestive heart failure presents with dyspnea, diffuse edema, including scrotal and penis swelling. Here patient is found to have acute kidney injury, as well as progression of heart failure. Patient received IV diuretics albuterol breathing treatment. Patient had minimal improvement, and given his acute kidney injury in addition to the aforementioned congestive heart failure progression, required admission for further evaluation, management.  Gerhard Munch, MD 05/13/16 [phone]

## 2016-05-14 ENCOUNTER — Inpatient Hospital Stay (HOSPITAL_COMMUNITY): Payer: Medicaid Other

## 2016-05-14 DIAGNOSIS — R570 Cardiogenic shock: Secondary | ICD-10-CM

## 2016-05-14 LAB — ECHOCARDIOGRAM COMPLETE
E decel time: 129 msec
FS: 9 % — AB (ref 28–44)
HEIGHTINCHES: 67 in
IVS/LV PW RATIO, ED: 0.82
LA vol index: 35.9 mL/m2
LADIAMINDEX: 2.28 cm/m2
LASIZE: 47 mm
LAVOL: 73.9 mL
LAVOLA4C: 77.3 mL
LDCA: 2.54 cm2
LEFT ATRIUM END SYS DIAM: 47 mm
LV PW d: 9 mm — AB (ref 0.6–1.1)
LVDIAVOL: 452 mL — AB (ref 62–150)
LVDIAVOLIN: 219 mL/m2
LVOT diameter: 18 mm
LVSYSVOL: 392 mL — AB (ref 21–61)
LVSYSVOLIN: 190 mL/m2
MV Dec: 129
MV Peak grad: 7 mmHg
MV pk E vel: 133 m/s
PISA EROA: 0.21 cm2
PV Reg grad dias: 6 mmHg
PV Reg vel dias: 125 cm/s
Simpson's disk: 13
Stroke v: 60 ml
TAPSE: 11.6 mm
VTI: 102 cm
Weight: 3336 oz

## 2016-05-14 LAB — CARBOXYHEMOGLOBIN
Carboxyhemoglobin: 1.3 % (ref 0.5–1.5)
Carboxyhemoglobin: 1.6 % — ABNORMAL HIGH (ref 0.5–1.5)
Methemoglobin: 0.8 % (ref 0.0–1.5)
Methemoglobin: 0.6 % (ref 0.0–1.5)
O2 SAT: 58.7 %
O2 Saturation: 55.7 %
TOTAL HEMOGLOBIN: 12.1 g/dL — AB (ref 13.5–18.0)
Total hemoglobin: 13.3 g/dL — ABNORMAL LOW (ref 13.5–18.0)

## 2016-05-14 LAB — BASIC METABOLIC PANEL
Anion gap: 11 (ref 5–15)
BUN: 33 mg/dL — ABNORMAL HIGH (ref 6–20)
CALCIUM: 8.9 mg/dL (ref 8.9–10.3)
CO2: 33 mmol/L — AB (ref 22–32)
CREATININE: 1.47 mg/dL — AB (ref 0.61–1.24)
Chloride: 91 mmol/L — ABNORMAL LOW (ref 101–111)
GFR calc Af Amer: >60 mL/min (ref >60)
GFR calc non Af Amer: 54 mL/min — ABNORMAL LOW (ref >60)
GLUCOSE: 183 mg/dL — AB (ref 65–99)
Potassium: 3.2 mmol/L — ABNORMAL LOW (ref 3.5–5.1)
Sodium: 135 mmol/L (ref 135–145)

## 2016-05-14 LAB — PROTIME-INR
INR: 4.96 — AB (ref 0.00–1.49)
Prothrombin Time: 44.6 seconds — ABNORMAL HIGH (ref 11.6–15.2)

## 2016-05-14 MED ORDER — METOLAZONE 5 MG PO TABS
5.0000 mg | ORAL_TABLET | Freq: Once | ORAL | Status: AC
Start: 2016-05-14 — End: 2016-05-14
  Administered 2016-05-14: 5 mg via ORAL
  Filled 2016-05-14: qty 1

## 2016-05-14 MED ORDER — PERFLUTREN LIPID MICROSPHERE
INTRAVENOUS | Status: AC
  Administered 2016-05-14: 2 mL
  Filled 2016-05-14: qty 10

## 2016-05-14 MED ORDER — ISOSORB DINITRATE-HYDRALAZINE 20-37.5 MG PO TABS: 0.5000 | ORAL_TABLET | Freq: Three times a day (TID) | ORAL | Status: DC

## 2016-05-14 MED ORDER — SPIRONOLACTONE 25 MG PO TABS
25.0000 mg | ORAL_TABLET | Freq: Every day | ORAL | Status: DC
  Administered 2016-05-14 – 2016-05-18 (×5): 25 mg via ORAL
  Filled 2016-05-14 (×5): qty 1

## 2016-05-14 MED ORDER — POTASSIUM CHLORIDE CRYS ER 20 MEQ PO TBCR
60.0000 meq | EXTENDED_RELEASE_TABLET | Freq: Three times a day (TID) | ORAL | Status: DC
  Administered 2016-05-14 (×3): 60 meq via ORAL
  Filled 2016-05-14 (×3): qty 3

## 2016-05-14 MED ORDER — ISOSORB DINITRATE-HYDRALAZINE 20-37.5 MG PO TABS
0.5000 | ORAL_TABLET | Freq: Three times a day (TID) | ORAL | Status: DC
  Administered 2016-05-14 – 2016-05-15 (×3): 0.5 via ORAL
  Filled 2016-05-14 (×3): qty 1

## 2016-05-14 MED ORDER — DIGOXIN 125 MCG PO TABS
0.1250 mg | ORAL_TABLET | Freq: Every day | ORAL | Status: DC
  Administered 2016-05-14 – 2016-05-18 (×5): 0.125 mg via ORAL
  Filled 2016-05-14 (×5): qty 1

## 2016-05-14 MED ORDER — POTASSIUM CHLORIDE CRYS ER 20 MEQ PO TBCR
40.0000 meq | EXTENDED_RELEASE_TABLET | Freq: Once | ORAL | Status: AC
  Administered 2016-05-14: 40 meq via ORAL
  Filled 2016-05-14: qty 2

## 2016-05-14 NOTE — Progress Notes (Signed)
Advanced Heart Failure Rounding Note   Subjective:    Milrinone added yesterday. Diuresed about 1L. Breathing better. CVP still 25. Weight unchanged.    Objective:   Weight Range:  Vital Signs:   Temp:  [97.5 F (36.4 C)-97.7 F (36.5 C)] 97.6 F (36.4 C) (06/19 0800) Pulse Rate:  [29-101] 90 (06/19 0800) Resp:  [14-39] 19 (06/19 0800) BP: (93-132)/(53-100) 127/98 mmHg (06/19 0800) SpO2:  [91 %-100 %] 96 % (06/19 0800) Weight:  [93.895 kg (207 lb)-94.575 kg (208 lb 8 oz)] 94.575 kg (208 lb 8 oz) (06/19 0400) Last BM Date: 05/12/16  Weight change: Filed Weights   05/13/16 0654 05/13/16 1550 05/14/16 0400  Weight: 93.951 kg (207 lb 2 oz) 93.895 kg (207 lb) 94.575 kg (208 lb 8 oz)    Intake/Output:   Intake/Output Summary (Last 24 hours) at 05/14/16 0852 Last data filed at 05/14/16 0800  Gross per 24 hour  Intake 146.45 ml  Output   1100 ml  Net -953.55 ml     Physical Exam: General:  Well appearing. No resp difficulty HEENT: normal Neck: supple. JVP ear . Carotids 2+ bilat; no bruits. No lymphadenopathy or thryomegaly appreciated. Cor: PMI laterally displaced. Regular rate & rhythm. +s3 Lungs: clear Abdomen: soft, nontender, nondistended. No hepatosplenomegaly. No bruits or masses. Good bowel sounds. Extremities: no cyanosis, clubbing, rash, trace-1+ edema Neuro: alert & orientedx3, cranial nerves grossly intact. moves all 4 extremities w/o difficulty. Affect pleasant  Telemetry: NSR  Labs: Basic Metabolic Panel:  Recent Labs Lab 05/13/16 0700 05/13/16 1210 05/13/16 1530 05/13/16 1645 05/14/16 0320  NA 134*  --  133* 135 135  K 2.8*  --  3.2* 3.1* 3.2*  CL 86*  --  90* 96* 91*  CO2 31  --  33* 31 33*  GLUCOSE 127*  --  150* 128* 183*  BUN 34*  --  34* 32* 33*  CREATININE 1.70*  --  1.58* 1.45* 1.47*  CALCIUM 9.2  --  8.7* 7.9* 8.9  MG  --  1.8  --   --   --     Liver Function Tests: No results for input(s): AST, ALT, ALKPHOS, BILITOT, PROT,  ALBUMIN in the last 168 hours. No results for input(s): LIPASE, AMYLASE in the last 168 hours. No results for input(s): AMMONIA in the last 168 hours.  CBC:  Recent Labs Lab 05/13/16 0700  WBC 6.1  NEUTROABS 4.2  HGB 14.5  HCT 42.9  MCV 84.1  PLT 158    Cardiac Enzymes: No results for input(s): CKTOTAL, CKMB, CKMBINDEX, TROPONINI in the last 168 hours.  BNP: BNP (last 3 results)  Recent Labs  12/29/15 0001 12/31/15 0238 05/13/16 0700  BNP 4130.4* 2584.7* 3255.2*    ProBNP (last 3 results) No results for input(s): PROBNP in the last 8760 hours.    Other results:  Imaging: Dg Chest 2 View  05/13/2016  CLINICAL DATA:  Dyspnea. EXAM: CHEST  2 VIEW COMPARISON:  Radiograph of December 28, 2015. FINDINGS: Stable cardiomegaly. Left-sided pacemaker is unchanged in position. No pneumothorax or pleural effusion is noted. No acute pulmonary disease is noted. Bony thorax is unremarkable. IMPRESSION: Stable cardiomegaly.  No acute cardiopulmonary abnormality seen. Electronically Signed   By: Lupita Raider, M.D.   On: 05/13/2016 09:00      Medications:     Scheduled Medications: . allopurinol  100 mg Oral Daily  . amiodarone  200 mg Oral Daily  . furosemide  100 mg Intravenous  BID  . hydrALAZINE  12.5 mg Oral Q8H  . nicotine  14 mg Transdermal Daily  . potassium chloride  40 mEq Oral TID  . sodium chloride flush  10-40 mL Intracatheter Q12H  . sodium chloride flush  3 mL Intravenous Q12H  . spironolactone  12.5 mg Oral Daily     Infusions: . milrinone 0.25 mcg/kg/min (05/14/16 0700)     PRN Medications:  sodium chloride, acetaminophen, ALPRAZolam, ondansetron (ZOFRAN) IV, sodium chloride flush, sodium chloride flush, zolpidem   Assessment:   1. Cardiogenic shock 2. Acute on chronic systolic HF due to NICM 3. Acute on chronic renal failure, stage 3 4. PAF - now in NSR 5. H/o LV thrombus 6. H/o ETOH and substance abuse - now quit 7. Tobacco  use   Plan/Discussion:    He remains very tenuous. Co-ox still in the low 50s on milrinone 0.25. CVP 25. Will increase milrinone to 0.375. Increase diuretics (add metolazone). Renal function improving. Increase spiro. Switch hydralazine to Bidil 1/2 tab tid.   I think he has reached the time to consider advanced therapies (inotropes, VAD, etc). However with CVP staying so high, I worry about significant RV dysfunction. Await repeat echo.   Social situation also a challenge but it seems like he has multiple family members whom he might be able to rely on.   Continue anti-coagulation for PAF and LV thrombus.   I will discuss plans with Dr. Sharyn Lull and see if he would like the AHF team to remain on board.     Length of Stay: 1  Teofila Bowery MD 05/14/2016, 8:52 AM  Advanced Heart Failure Team Pager [phone] (M-F; 7a - 4p)  Please contact [facility] for night-coverage after hours (4p -7a ) and weekends on [email or web address]

## 2016-05-14 NOTE — Progress Notes (Signed)
Subjective:  Appreciate the advanced heart failure team care and follow-up. Patient acceptable for LVAD or consideration for cardiac transplant. States his breathing and swelling has improved. Denies any chest pain. Renal function improved  after starting milrinone. Still remains volume overloaded. Noted to have occasional episodes of nonsustained VT. No ICD shocks.  Objective:  Vital Signs in the last 24 hours: Temp:  [97.5 F (36.4 C)-97.7 F (36.5 C)] 97.6 F (36.4 C) (06/19 0800) Pulse Rate:  [29-102] 102 (06/19 1000) Resp:  [14-39] 35 (06/19 1000) BP: (93-132)/(53-100) 127/98 mmHg (06/19 0800) SpO2:  [92 %-100 %] 98 % (06/19 1000) Weight:  [93.895 kg (207 lb)-94.575 kg (208 lb 8 oz)] 94.575 kg (208 lb 8 oz) (06/19 0400)  Intake/Output from previous day: 06/18 0701 - 06/19 0700 In: 136.5 [I.V.:86.5; IV Piggyback:50] Out: 1625 [Urine:1625] Intake/Output from this shift: Total I/O In: 560.5 [P.O.:480; I.V.:30.5; IV Piggyback:50] Out: 850 [Urine:850]  Physical Exam: Neck: JVD - 10 cm above sternal notch, no adenopathy, no carotid bruit and supple, symmetrical, trachea midline Lungs: Decreased breath sound at bases Heart: regular rate and rhythm, S1, S2 normal and Soft systolic murmur and S3 gallop noted Abdomen: soft, non-tender; bowel sounds normal; no masses,  no organomegaly Extremities: No clubbing cyanosis 2+ edema noted  Lab Results:  Recent Labs  05/13/16 0700  WBC 6.1  HGB 14.5  PLT 158    Recent Labs  05/13/16 1645 05/14/16 0320  NA 135 135  K 3.1* 3.2*  CL 96* 91*  CO2 31 33*  GLUCOSE 128* 183*  BUN 32* 33*  CREATININE 1.45* 1.47*   No results for input(s): TROPONINI in the last 72 hours.  Invalid input(s): CK, MB Hepatic Function Panel No results for input(s): PROT, ALBUMIN, AST, ALT, ALKPHOS, BILITOT, BILIDIR, IBILI in the last 72 hours. No results for input(s): CHOL in the last 72 hours. No results for input(s): PROTIME in the last 72  hours.  Imaging: Imaging results have been reviewed and Dg Chest 2 View  05/13/2016  CLINICAL DATA:  Dyspnea. EXAM: CHEST  2 VIEW COMPARISON:  Radiograph of December 28, 2015. FINDINGS: Stable cardiomegaly. Left-sided pacemaker is unchanged in position. No pneumothorax or pleural effusion is noted. No acute pulmonary disease is noted. Bony thorax is unremarkable. IMPRESSION: Stable cardiomegaly.  No acute cardiopulmonary abnormality seen. Electronically Signed   By: Lupita Raider, M.D.   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 Check labs in a.m. Will check 2-D echo  LOS: 1 day    Rinaldo Cloud 05/14/2016, 11:50 AM

## 2016-05-14 NOTE — Progress Notes (Signed)
Echocardiogram 2D Echocardiogram has been performed.  Parker Johnson 05/14/2016, 11:45 AM

## 2016-05-14 NOTE — Progress Notes (Signed)
  ANTICOAGULATION CONSULT NOTE - Follow Up Consult  Pharmacy Consult for Warfarin Indication: atrial fibrillation, and LV thrombus  Assessment: 50 yo with history of CAD - VT requiring AICD, Cardiomyopathy with CHF, HTN, Afib and LV thrombus.  He also has a history of hypertension, alcohol and tobacco use.  His INR on admit is 3.46 and his last dose was 6/17 at 7PM.  No warfarin last night but increase in INR - possible passive liver congestion from volume overload.  Check LFTs in am and continue to hold warfarin for now.  HOME dose:  Takes 10mg  on Wed/Sun and 5mg  all other days     Atrial Fibrillation CHADS(2) Score for Stroke Risk Select Criteria:  Diagnosed heart failure, past or current (1 point)  Hypertension treated or untreated (1 point)  Age >= 75 years (1 point)  Diabetes Mellitus (1 point)  Secondary prevention in patients with prior ischemic stroke, TIA or thromboembolism (2 points) Results: Total Criteria Point Count:   2 Reset Form Stroke Risk per 100 Person Years/Warfarin Rx Interpretation 0 Points: 0.25 ON Rx;  0.49 NO Rx 1 Point: 0.72 ON Rx;   1.52 NO Rx 2 Points: 1.27 ON Rx;  2.50 No Rx 3 Points: 2.20 ON Rx;  5.27 NO Rx 4 Points: 2.35 ON Rx;  6.02 NO Rx 5-6 Points: 4.60 ON Rx;  6.88 NO Rx  No Known Allergies  Patient Measurements: Height: 5\' 7"  (170.2 cm) Weight: 208 lb 8 oz (94.575 kg) IBW/kg (Calculated) : 66.1  Vital Signs: Temp: 97.6 F (36.4 C) (06/19 0800) Temp Source: Oral (06/19 0800) BP: 127/98 mmHg (06/19 0800) Pulse Rate: 102 (06/19 1000)  Labs:  Recent Labs  05/13/16 0700 05/13/16 1530 05/13/16 1645 05/14/16 0320  HGB 14.5  --   --   --   HCT 42.9  --   --   --   PLT 158  --   --   --   LABPROT 34.1*  --   --  44.6*  INR 3.46*  --   --  4.96*  CREATININE 1.70* 1.58* 1.45* 1.47*   Estimated Creatinine Clearance: 66.6 mL/min (by C-G formula based on Cr of 1.47).  Medical History: Past Medical History  Diagnosis Date  .  Ventricular tachycardia (HCC)   . AICD (automatic cardioverter/defibrillator) present     [employer]  . Tobacco abuse   . Alcohol abuse     hx  . Cardiomyopathy     secondary  . HTN (hypertension)     uncontrolled  . CHF (congestive heart failure) (HCC)   . Gout   . Chronic anticoagulation     Coumadin for hx LV thrombus and PAF   Goal of Therapy:  INR 2-3 Monitor platelets by anticoagulation protocol: Yes   Plan:  - Hold Warfarin today  - Check PT/INR in AM  - Monitor for s/s of bleeding   Leota Sauers Pharm.D. CPP, BCPS Clinical Pharmacist [phone] 05/14/2016 10:41 AM

## 2016-05-14 NOTE — Progress Notes (Signed)
Echo images reviewed.   LVEF 10% with severe RV dysfunction. With RV dysfunction and markedly elevated CVP likely not candidate for LVAD therapy as RV wouldn't support. Best option for now might be home inotropes (milrinone) with possible transplant evaluation down the road if he can get his social issues in check.   We will continue to titrate his Bidil and diuretics in hope that we can wean milrinone in house but given echo results, I think that may be difficult.   Bertrum Helmstetter,MD 7:28 PM

## 2016-05-15 ENCOUNTER — Encounter: Payer: Medicaid Other | Admitting: Physician Assistant

## 2016-05-15 ENCOUNTER — Encounter: Payer: Self-pay | Admitting: Cardiology

## 2016-05-15 LAB — CARBOXYHEMOGLOBIN
Carboxyhemoglobin: 1.3 % (ref 0.5–1.5)
Methemoglobin: 0.8 % (ref 0.0–1.5)
O2 SAT: 59.8 %
Total hemoglobin: 12.5 g/dL — ABNORMAL LOW (ref 13.5–18.0)

## 2016-05-15 LAB — COMPREHENSIVE METABOLIC PANEL
ALBUMIN: 2.9 g/dL — AB (ref 3.5–5.0)
ALK PHOS: 82 U/L (ref 38–126)
ALT: 18 U/L (ref 17–63)
ANION GAP: 9 (ref 5–15)
AST: 24 U/L (ref 15–41)
BILIRUBIN TOTAL: 3.1 mg/dL — AB (ref 0.3–1.2)
BUN: 35 mg/dL — AB (ref 6–20)
CALCIUM: 9.2 mg/dL (ref 8.9–10.3)
CO2: 37 mmol/L — ABNORMAL HIGH (ref 22–32)
Chloride: 88 mmol/L — ABNORMAL LOW (ref 101–111)
Creatinine, Ser: 1.65 mg/dL — ABNORMAL HIGH (ref 0.61–1.24)
GFR calc Af Amer: 55 mL/min — ABNORMAL LOW (ref >60)
GFR, EST NON AFRICAN AMERICAN: 47 mL/min — AB (ref >60)
GLUCOSE: 146 mg/dL — AB (ref 65–99)
Potassium: 4.7 mmol/L (ref 3.5–5.1)
Sodium: 134 mmol/L — ABNORMAL LOW (ref 135–145)
TOTAL PROTEIN: 6.8 g/dL (ref 6.5–8.1)

## 2016-05-15 LAB — CBC
HEMATOCRIT: 40.9 % (ref 39.0–52.0)
Hemoglobin: 13.3 g/dL (ref 13.0–17.0)
MCH: 28.1 pg (ref 26.0–34.0)
MCHC: 32.5 g/dL (ref 30.0–36.0)
MCV: 86.3 fL (ref 78.0–100.0)
Platelets: 146 10*3/uL — ABNORMAL LOW (ref 150–400)
RBC: 4.74 MIL/uL (ref 4.22–5.81)
RDW: 21 % — ABNORMAL HIGH (ref 11.5–15.5)
WBC: 6.5 10*3/uL (ref 4.0–10.5)

## 2016-05-15 LAB — PROTIME-INR
INR: 3.47 — ABNORMAL HIGH (ref 0.00–1.49)
PROTHROMBIN TIME: 34.1 s — AB (ref 11.6–15.2)

## 2016-05-15 LAB — MAGNESIUM: Magnesium: 1.7 mg/dL (ref 1.7–2.4)

## 2016-05-15 LAB — BRAIN NATRIURETIC PEPTIDE: B Natriuretic Peptide: 2707.4 pg/mL — ABNORMAL HIGH (ref 0.0–100.0)

## 2016-05-15 MED ORDER — ALTEPLASE 2 MG IJ SOLR
2.0000 mg | Freq: Once | INTRAMUSCULAR | Status: AC
  Filled 2016-05-15: qty 2

## 2016-05-15 MED ORDER — POTASSIUM CHLORIDE CRYS ER 20 MEQ PO TBCR
40.0000 meq | EXTENDED_RELEASE_TABLET | Freq: Three times a day (TID) | ORAL | Status: DC
  Administered 2016-05-15: 40 meq via ORAL
  Filled 2016-05-15: qty 2

## 2016-05-15 MED ORDER — MAGNESIUM SULFATE 2 GM/50ML IV SOLN
2.0000 g | Freq: Once | INTRAVENOUS | Status: AC
  Administered 2016-05-15: 2 g via INTRAVENOUS
  Filled 2016-05-15: qty 50

## 2016-05-15 MED ORDER — ALTEPLASE 2 MG IJ SOLR
2.0000 mg | Freq: Once | INTRAMUSCULAR | Status: AC
  Administered 2016-05-15: 2 mg
  Filled 2016-05-15: qty 2

## 2016-05-15 MED ORDER — ISOSORB DINITRATE-HYDRALAZINE 20-37.5 MG PO TABS
1.0000 | ORAL_TABLET | Freq: Two times a day (BID) | ORAL | Status: DC
  Administered 2016-05-15 – 2016-05-17 (×4): 1 via ORAL
  Filled 2016-05-15 (×4): qty 1

## 2016-05-15 MED ORDER — FUROSEMIDE 10 MG/ML IJ SOLN
80.0000 mg | Freq: Two times a day (BID) | INTRAMUSCULAR | Status: DC
  Administered 2016-05-15 – 2016-05-17 (×5): 80 mg via INTRAVENOUS
  Filled 2016-05-15 (×6): qty 8

## 2016-05-15 MED ORDER — POTASSIUM CHLORIDE CRYS ER 20 MEQ PO TBCR: 20.0000 meq | EXTENDED_RELEASE_TABLET | Freq: Two times a day (BID) | ORAL | Status: DC

## 2016-05-15 NOTE — Progress Notes (Signed)
Patient with several runs of v-tach. On call cardiologist notified via text page. Patient asymptomatic and in no discomfort or distress. Will continue to monitor. Asher Muir Mykelle Cockerell,RN

## 2016-05-15 NOTE — Progress Notes (Signed)
Subjective:  Patient denies any chest pain states breathing and leg swelling has markedly improved. Denies any palpitations.diuresing well.2-D echo showed markedly depressed LV systolic function EF approximately 10% being considered for IV inotropes as outpatient and evaluation for heart transplant.  Objective:  Vital Signs in the last 24 hours: Temp:  [97.4 F (36.3 C)-98.5 F (36.9 C)] 97.6 F (36.4 C) (06/20 1159) Pulse Rate:  [89-102] 94 (06/20 1159) Resp:  [19-35] 35 (06/20 1159) BP: (90-136)/(64-90) 118/79 mmHg (06/20 1159) SpO2:  [93 %-100 %] 97 % (06/20 1159) Weight:  [91.899 kg (202 lb 9.6 oz)] 91.899 kg (202 lb 9.6 oz) (06/20 0400)  Intake/Output from previous day: 06/19 0701 - 06/20 0700 In: 1542.5 [P.O.:1200; I.V.:242.5; IV Piggyback:100] Out: 5975 [Urine:5975] Intake/Output from this shift: Total I/O In: 53 [I.V.:53] Out: 675 [Urine:675]  Physical Exam: Neck: JVD - 10 cm above sternal notch, no adenopathy, no carotid bruit and supple, symmetrical, trachea midline Lungs: decreased breath sound at bases Heart: regular rate and rhythm, S1, S2 normal and soft systolic murmur and S3 gallop noted Abdomen: soft, non-tender; bowel sounds normal; no masses,  no organomegaly Extremities: no clubbing cyanosis 2+ edema noted  Lab Results:  Recent Labs  05/13/16 0700 05/15/16 0408  WBC 6.1 6.5  HGB 14.5 13.3  PLT 158 146*    Recent Labs  05/14/16 0320 05/15/16 0408  NA 135 134*  K 3.2* 4.7  CL 91* 88*  CO2 33* 37*  GLUCOSE 183* 146*  BUN 33* 35*  CREATININE 1.47* 1.65*   No results for input(s): TROPONINI in the last 72 hours.  Invalid input(s): CK, MB Hepatic Function Panel  Recent Labs  05/15/16 0408  PROT 6.8  ALBUMIN 2.9*  AST 24  ALT 18  ALKPHOS 82  BILITOT 3.1*   No results for input(s): CHOL in the last 72 hours. No results for input(s): PROTIME in the last 72 hours.  Imaging: Imaging results have been reviewed and No results  found.  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 Increase BiDil to one tablet twice a day Will hold off ACE inhibitor for now in view of worsening renal function  LOS: 2 days    Parker Johnson 05/15/2016, 12:01 PM

## 2016-05-15 NOTE — Progress Notes (Signed)
Advanced Heart Failure Rounding Note   Subjective:    Remains on milrinone 0.25. Echo with severe biventricular dysfunction. LVEF 10%  Diuresing briskly. Weight down 6 pounds. Feels better. Co-ox 60% Cr 1.47-> 1.65  CVP 25 -> 17  Objective:   Weight Range:  Vital Signs:   Temp:  [97.4 F (36.3 C)-98.5 F (36.9 C)] 97.6 F (36.4 C) (06/20 1601) Pulse Rate:  [89-102] 90 (06/20 1601) Resp:  [19-35] 27 (06/20 1601) BP: (90-118)/(64-90) 107/77 mmHg (06/20 1601) SpO2:  [93 %-100 %] 100 % (06/20 1601) Weight:  [202 lb 9.6 oz (91.899 kg)] 202 lb 9.6 oz (91.899 kg) (06/20 0400) Last BM Date: 05/14/16  Weight change: Filed Weights   05/13/16 1550 05/14/16 0400 05/15/16 0400  Weight: 207 lb (93.895 kg) 208 lb 8 oz (94.575 kg) 202 lb 9.6 oz (91.899 kg)    Intake/Output:   Intake/Output Summary (Last 24 hours) at 05/15/16 1628 Last data filed at 05/15/16 1456  Gross per 24 hour  Intake  491.4 ml  Output   6625 ml  Net -6133.6 ml     Physical Exam: General:  Well appearing. No resp difficulty HEENT: normal Neck: supple. JVP ear . Carotids 2+ bilat; no bruits. No lymphadenopathy or thryomegaly appreciated. Cor: PMI laterally displaced. Regular rate & rhythm. +s3 Lungs: clear Abdomen: soft, nontender, nondistended. No hepatosplenomegaly. No bruits or masses. Good bowel sounds. Extremities: no cyanosis, clubbing, rash, trace-1+ edema Neuro: alert & orientedx3, cranial nerves grossly intact. moves all 4 extremities w/o difficulty. Affect pleasant  Telemetry: NSR  Labs: Basic Metabolic Panel:  Recent Labs Lab 05/13/16 0700 05/13/16 1210 05/13/16 1530 05/13/16 1645 05/14/16 0320 05/15/16 0408  NA 134*  --  133* 135 135 134*  K 2.8*  --  3.2* 3.1* 3.2* 4.7  CL 86*  --  90* 96* 91* 88*  CO2 31  --  33* 31 33* 37*  GLUCOSE 127*  --  150* 128* 183* 146*  BUN 34*  --  34* 32* 33* 35*  CREATININE 1.70*  --  1.58* 1.45* 1.47* 1.65*  CALCIUM 9.2  --  8.7* 7.9* 8.9  9.2  MG  --  1.8  --   --   --  1.7    Liver Function Tests:  Recent Labs Lab 05/15/16 0408  AST 24  ALT 18  ALKPHOS 82  BILITOT 3.1*  PROT 6.8  ALBUMIN 2.9*   No results for input(s): LIPASE, AMYLASE in the last 168 hours. No results for input(s): AMMONIA in the last 168 hours.  CBC:  Recent Labs Lab 05/13/16 0700 05/15/16 0408  WBC 6.1 6.5  NEUTROABS 4.2  --   HGB 14.5 13.3  HCT 42.9 40.9  MCV 84.1 86.3  PLT 158 146*    Cardiac Enzymes: No results for input(s): CKTOTAL, CKMB, CKMBINDEX, TROPONINI in the last 168 hours.  BNP: BNP (last 3 results)  Recent Labs  12/31/15 0238 05/13/16 0700 05/15/16 0408  BNP 2584.7* 3255.2* 2707.4*    ProBNP (last 3 results) No results for input(s): PROBNP in the last 8760 hours.    Other results:  Imaging: No results found.   Medications:     Scheduled Medications: . allopurinol  100 mg Oral Daily  . amiodarone  200 mg Oral Daily  . digoxin  0.125 mg Oral Daily  . furosemide  80 mg Intravenous BID  . isosorbide-hydrALAZINE  1 tablet Oral BID AC  . nicotine  14 mg Transdermal Daily  .  potassium chloride  20 mEq Oral BID  . sodium chloride flush  10-40 mL Intracatheter Q12H  . sodium chloride flush  3 mL Intravenous Q12H  . spironolactone  25 mg Oral Daily    Infusions: . milrinone 0.375 mcg/kg/min (05/15/16 1100)    PRN Medications: sodium chloride, acetaminophen, ALPRAZolam, ondansetron (ZOFRAN) IV, sodium chloride flush, sodium chloride flush, zolpidem   Assessment:   1. Cardiogenic shock 2. Acute on chronic systolic HF due to NICM    -LVEF 10% RV severely HK 3. Acute on chronic renal failure, stage 3 4. PAF - now in NSR 5. H/o LV thrombus 6. H/o ETOH and substance abuse - now quit 7. Tobacco use   Plan/Discussion:    Diuresing briskly on milrinone. LVEF 10% with severe RV dysfunction. With RV dysfunction and markedly elevated CVP likely not candidate for LVAD therapy as RV wouldn't  support. Best option for now might be home inotropes (milrinone) with possible transplant evaluation down the road if he can get his social issues in check.   We will continue to diurese and titrate his Bidil in hope that we can wean milrinone in house but given echo results, I think that may be difficult. Given AKI will cut lasix back a bit. Titrate Bidil to 1 tab TID. Watch renal function closely. Continue amio, dig and spiro. Stop Kcl.    Social situation also a challenge but it seems like he has multiple family members whom he might be able to rely on.   Continue anti-coagulation for PAF and LV thrombus.   Length of Stay: 2  Arvilla Meres MD 05/15/2016, 4:28 PM  Advanced Heart Failure Team Pager [phone] (M-F; 7a - 4p)  Please contact [facility] for night-coverage after hours (4p -7a ) and weekends on [email or web address]

## 2016-05-15 NOTE — Progress Notes (Signed)
  ANTICOAGULATION CONSULT NOTE - Follow Up Consult  Pharmacy Consult for Warfarin Indication: atrial fibrillation, and LV thrombus  Assessment: 50 yo with history of CAD - VT requiring AICD, Cardiomyopathy with CHF, HTN, Afib and LV thrombus.  He also has a history of hypertension, alcohol and tobacco use.  His INR on admit is 3.46 and his last dose was 6/17 at 7PM.  No warfarin last 2 nights but  INR remains elevated - possible passive liver congestion from volume overload.  Check LFTs in am and continue to hold warfarin for now.  HOME dose:  Takes 10mg  on Wed/Sun and 5mg  all other days     Atrial Fibrillation CHADS(2) Score for Stroke Risk Select Criteria:  Diagnosed heart failure, past or current (1 point)  Hypertension treated or untreated (1 point)  Age >= 75 years (1 point)  Diabetes Mellitus (1 point)  Secondary prevention in patients with prior ischemic stroke, TIA or thromboembolism (2 points) Results: Total Criteria Point Count:   2 Reset Form Stroke Risk per 100 Person Years/Warfarin Rx Interpretation 0 Points: 0.25 ON Rx;  0.49 NO Rx 1 Point: 0.72 ON Rx;   1.52 NO Rx 2 Points: 1.27 ON Rx;  2.50 No Rx 3 Points: 2.20 ON Rx;  5.27 NO Rx 4 Points: 2.35 ON Rx;  6.02 NO Rx 5-6 Points: 4.60 ON Rx;  6.88 NO Rx  No Known Allergies  Patient Measurements: Height: 5\' 7"  (170.2 cm) Weight: 202 lb 9.6 oz (91.899 kg) IBW/kg (Calculated) : 66.1  Vital Signs: Temp: 97.6 F (36.4 C) (06/20 1159) Temp Source: Oral (06/20 1159) BP: 118/79 mmHg (06/20 1159) Pulse Rate: 94 (06/20 1159)  Labs:  Recent Labs  05/13/16 0700  05/13/16 1645 05/14/16 0320 05/15/16 0408  HGB 14.5  --   --   --  13.3  HCT 42.9  --   --   --  40.9  PLT 158  --   --   --  146*  LABPROT 34.1*  --   --  44.6* 34.1*  INR 3.46*  --   --  4.96* 3.47*  CREATININE 1.70*  < > 1.45* 1.47* 1.65*  < > = values in this interval not displayed. Estimated Creatinine Clearance: 58.5 mL/min (by C-G formula  based on Cr of 1.65).  Medical History: Past Medical History  Diagnosis Date  . Ventricular tachycardia (HCC)   . AICD (automatic cardioverter/defibrillator) present     [employer] 100  . Tobacco abuse   . Alcohol abuse     hx  . Cardiomyopathy     secondary  . HTN (hypertension)     uncontrolled  . CHF (congestive heart failure) (HCC)   . Gout   . Chronic anticoagulation     Coumadin for hx LV thrombus and PAF   Goal of Therapy:  INR 2-3 Monitor platelets by anticoagulation protocol: Yes   Plan:  - Hold Warfarin today  - Check PT/INR in AM  - Monitor for s/s of bleeding   Leota Sauers Pharm.D. CPP, BCPS Clinical Pharmacist [phone] 05/15/2016 1:08 PM

## 2016-05-16 LAB — CARBOXYHEMOGLOBIN
Carboxyhemoglobin: 1.7 % — ABNORMAL HIGH (ref 0.5–1.5)
Methemoglobin: 0.8 % (ref 0.0–1.5)
O2 SAT: 61.8 %
TOTAL HEMOGLOBIN: 13.1 g/dL — AB (ref 13.5–18.0)

## 2016-05-16 LAB — BASIC METABOLIC PANEL
Anion gap: 10 (ref 5–15)
BUN: 31 mg/dL — ABNORMAL HIGH (ref 6–20)
CALCIUM: 9.4 mg/dL (ref 8.9–10.3)
CO2: 38 mmol/L — ABNORMAL HIGH (ref 22–32)
CREATININE: 1.51 mg/dL — AB (ref 0.61–1.24)
Chloride: 85 mmol/L — ABNORMAL LOW (ref 101–111)
GFR calc non Af Amer: 53 mL/min — ABNORMAL LOW (ref >60)
Glucose, Bld: 192 mg/dL — ABNORMAL HIGH (ref 65–99)
Potassium: 3.9 mmol/L (ref 3.5–5.1)
SODIUM: 133 mmol/L — AB (ref 135–145)

## 2016-05-16 LAB — PROTIME-INR
INR: 2.71 — ABNORMAL HIGH (ref 0.00–1.49)
Prothrombin Time: 28.3 seconds — ABNORMAL HIGH (ref 11.6–15.2)

## 2016-05-16 LAB — BRAIN NATRIURETIC PEPTIDE: B Natriuretic Peptide: 2453.6 pg/mL — ABNORMAL HIGH (ref 0.0–100.0)

## 2016-05-16 MED ORDER — POTASSIUM CHLORIDE CRYS ER 20 MEQ PO TBCR
40.0000 meq | EXTENDED_RELEASE_TABLET | Freq: Two times a day (BID) | ORAL | Status: DC
  Administered 2016-05-16 – 2016-05-18 (×5): 40 meq via ORAL
  Filled 2016-05-16 (×5): qty 2

## 2016-05-16 MED ORDER — LOSARTAN POTASSIUM 25 MG PO TABS
12.5000 mg | ORAL_TABLET | Freq: Every day | ORAL | Status: DC
  Administered 2016-05-16 – 2016-05-18 (×3): 12.5 mg via ORAL
  Filled 2016-05-16 (×3): qty 1

## 2016-05-16 MED ORDER — WARFARIN - PHARMACIST DOSING INPATIENT
Freq: Every day | Status: DC
  Administered 2016-05-16: 18:00:00

## 2016-05-16 MED ORDER — WARFARIN SODIUM 7.5 MG PO TABS
7.5000 mg | ORAL_TABLET | Freq: Once | ORAL | Status: AC
  Administered 2016-05-16: 7.5 mg via ORAL
  Filled 2016-05-16: qty 1

## 2016-05-16 NOTE — Progress Notes (Signed)
  ANTICOAGULATION CONSULT NOTE - Follow Up Consult  Pharmacy Consult for Warfarin Indication: atrial fibrillation, and LV thrombus  Assessment: 50 yo with history of CAD - VT requiring AICD, Cardiomyopathy with CHF, HTN, Afib and LV thrombus.  He also has a history of hypertension, alcohol and tobacco use.  His INR on admit is 3.46 and his last dose was 6/17 at 7PM.  No warfarin x2 days INR remained elevated - possible passive liver congestion from volume overload.  LFTs WNL INR dropped 2.7 will restart warfarin   HOME dose:  Takes 10mg  on Wed/Sun and 5mg  all other days     Atrial Fibrillation CHADS(2) Score for Stroke Risk Select Criteria:  Diagnosed heart failure, past or current (1 point)  Hypertension treated or untreated (1 point)  Age >= 75 years (1 point)  Diabetes Mellitus (1 point)  Secondary prevention in patients with prior ischemic stroke, TIA or thromboembolism (2 points) Results: Total Criteria Point Count:   2 Reset Form Stroke Risk per 100 Person Years/Warfarin Rx Interpretation 0 Points: 0.25 ON Rx;  0.49 NO Rx 1 Point: 0.72 ON Rx;   1.52 NO Rx 2 Points: 1.27 ON Rx;  2.50 No Rx 3 Points: 2.20 ON Rx;  5.27 NO Rx 4 Points: 2.35 ON Rx;  6.02 NO Rx 5-6 Points: 4.60 ON Rx;  6.88 NO Rx  No Known Allergies  Patient Measurements: Height: 5\' 7"  (170.2 cm) Weight: 193 lb 2 oz (87.6 kg) IBW/kg (Calculated) : 66.1  Vital Signs: Temp: 97.7 F (36.5 C) (06/21 0404) Temp Source: Oral (06/21 0404) BP: 118/75 mmHg (06/21 0404) Pulse Rate: 95 (06/21 0404)  Labs:  Recent Labs  05/14/16 0320 05/15/16 0408 05/16/16 0420  HGB  --  13.3  --   HCT  --  40.9  --   PLT  --  146*  --   LABPROT 44.6* 34.1* 28.3*  INR 4.96* 3.47* 2.71*  CREATININE 1.47* 1.65* 1.51*   Estimated Creatinine Clearance: 62.5 mL/min (by C-G formula based on Cr of 1.51).  Medical History: Past Medical History  Diagnosis Date  . Ventricular tachycardia (HCC)   . AICD (automatic  cardioverter/defibrillator) present     [employer]  . Tobacco abuse   . Alcohol abuse     hx  . Cardiomyopathy     secondary  . HTN (hypertension)     uncontrolled  . CHF (congestive heart failure) (HCC)   . Gout   . Chronic anticoagulation     Coumadin for hx LV thrombus and PAF   Goal of Therapy:  INR 2-3 Monitor platelets by anticoagulation protocol: Yes   Plan:  -  Warfarin 7.5mg  x1  today  - Check PT/INR in AM  - Monitor for s/s of bleeding   Leota Sauers Pharm.D. CPP, BCPS Clinical Pharmacist [phone] 05/16/2016 8:09 AM

## 2016-05-16 NOTE — Progress Notes (Signed)
Advanced Heart Failure Rounding Note   Subjective:    Remains on milrinone 0.25. Echo with severe biventricular dysfunction. LVEF 10%  Diuresing briskly.  Negative 5 liters. Weight down another 9 pounds.  Co-ox 62% Cr 1.51  CVP 20   Objective:   Weight Range:  Vital Signs:   Temp:  [97.5 F (36.4 C)-97.8 F (36.6 C)] 97.7 F (36.5 C) (06/21 0404) Pulse Rate:  [89-95] 95 (06/21 0404) Resp:  [21-35] 32 (06/21 0404) BP: (104-118)/(58-79) 118/75 mmHg (06/21 0404) SpO2:  [92 %-100 %] 94 % (06/21 0404) Weight:  [193 lb 2 oz (87.6 kg)] 193 lb 2 oz (87.6 kg) (06/21 0404) Last BM Date: 05/14/16  Weight change: Filed Weights   05/14/16 0400 05/15/16 0400 05/16/16 0404  Weight: 208 lb 8 oz (94.575 kg) 202 lb 9.6 oz (91.899 kg) 193 lb 2 oz (87.6 kg)    Intake/Output:   Intake/Output Summary (Last 24 hours) at 05/16/16 0752 Last data filed at 05/16/16 0552  Gross per 24 hour  Intake 252.99 ml  Output   6200 ml  Net -5947.01 ml     Physical Exam: CVP 20  General:  Well appearing. No resp difficulty. In bed.  HEENT: normal Neck: supple. JVP ear . Carotids 2+ bilat; no bruits. No lymphadenopathy or thryomegaly appreciated. Cor: PMI laterally displaced. Regular rate & rhythm. +s3 Lungs: clear Abdomen: soft, nontender, nondistended. No hepatosplenomegaly. No bruits or masses. Good bowel sounds. Extremities: no cyanosis, clubbing, rash, R and LLE 2+ edema. Ted hose.  Neuro: alert & orientedx3, cranial nerves grossly intact. moves all 4 extremities w/o difficulty. Affect pleasant  Telemetry: NSR PVCs 90s   Labs: Basic Metabolic Panel:  Recent Labs Lab 05/13/16 1210 05/13/16 1530 05/13/16 1645 05/14/16 0320 05/15/16 0408 05/16/16 0420  NA  --  133* 135 135 134* 133*  K  --  3.2* 3.1* 3.2* 4.7 3.9  CL  --  90* 96* 91* 88* 85*  CO2  --  33* 31 33* 37* 38*  GLUCOSE  --  150* 128* 183* 146* 192*  BUN  --  34* 32* 33* 35* 31*  CREATININE  --  1.58* 1.45* 1.47*  1.65* 1.51*  CALCIUM  --  8.7* 7.9* 8.9 9.2 9.4  MG 1.8  --   --   --  1.7  --     Liver Function Tests:  Recent Labs Lab 05/15/16 0408  AST 24  ALT 18  ALKPHOS 82  BILITOT 3.1*  PROT 6.8  ALBUMIN 2.9*   No results for input(s): LIPASE, AMYLASE in the last 168 hours. No results for input(s): AMMONIA in the last 168 hours.  CBC:  Recent Labs Lab 05/13/16 0700 05/15/16 0408  WBC 6.1 6.5  NEUTROABS 4.2  --   HGB 14.5 13.3  HCT 42.9 40.9  MCV 84.1 86.3  PLT 158 146*    Cardiac Enzymes: No results for input(s): CKTOTAL, CKMB, CKMBINDEX, TROPONINI in the last 168 hours.  BNP: BNP (last 3 results)  Recent Labs  05/13/16 0700 05/15/16 0408 05/16/16 0420  BNP 3255.2* 2707.4* 2453.6*    ProBNP (last 3 results) No results for input(s): PROBNP in the last 8760 hours.    Other results:  Imaging: No results found.   Medications:     Scheduled Medications: . allopurinol  100 mg Oral Daily  . amiodarone  200 mg Oral Daily  . digoxin  0.125 mg Oral Daily  . furosemide  80 mg Intravenous BID  .  isosorbide-hydrALAZINE  1 tablet Oral BID AC  . nicotine  14 mg Transdermal Daily  . sodium chloride flush  10-40 mL Intracatheter Q12H  . sodium chloride flush  3 mL Intravenous Q12H  . spironolactone  25 mg Oral Daily    Infusions: . milrinone 0.375 mcg/kg/min (05/16/16 0552)    PRN Medications: sodium chloride, acetaminophen, ALPRAZolam, ondansetron (ZOFRAN) IV, sodium chloride flush, sodium chloride flush, zolpidem   Assessment:   1. Cardiogenic shock 2. Acute on chronic systolic HF due to NICM    -LVEF 10% RV severely HK 3. Acute on chronic renal failure, stage 3 4. PAF - now in NSR 5. H/o LV thrombus 6. H/o ETOH and substance abuse - now quit 7. Tobacco use   Plan/Discussion:    LVEF 10% with severe RV dysfunction. Continue milrinone 0.375 mcg. CVP 20. Will need a few more days of IV diuresis. Brisk diuresis with 80 mg IV lasix twice daily.  Continue sprio 25 daily. Give 40 meq twice a day. Continue bidil 1 tab tid. On dig check level tomorrow. Renal function ok.   Continue anti-coagulation for PAF and LV thrombus. INR 2.7  On Amio 200 mg daily for PAF and history of VT.   Advanced therapies may be an option though RV could be a limiting factor for LVAD.  Social situation also a challenge but it seems like he has multiple family members whom he might be able to rely on.   Consult cardiac rehab.   Length of Stay: 3  Amy Clegg NP-C  05/16/2016, 7:52 AM  Advanced Heart Failure Team Pager [phone] (M-F; 7a - 4p)  Please contact [facility] for night-coverage after hours (4p -7a ) and weekends on [email or web address]  Patient seen with NP, agree with the above note.  Patient remains markedly volume overloaded but is diuresing well.  Co-ox stable at 61%, CVP remains 20.   - Continue milrinone 0.375 => concerned that he may be inotrope dependent, but will attempt wean when he is more fully diuresed.  - Continue Bidil, digoxin, spironolactone.  Check digoxin level tomorrow. - Creatinine lower, will try low dose losartan 12.5 mg daily today. - Continue current IV Lasix, diuresing well on this regimen.   Continue amiodarone, has had NSVT runs (h/o PAF and VT).   Long-term, if he remains inotrope-dependent will need evaluation for advanced therapies.  LVAD may be option but RV dysfunction will be an issue.   Marca Ancona 05/16/2016 8:14 AM

## 2016-05-16 NOTE — Progress Notes (Signed)
CARDIAC REHAB PHASE I   PRE:  Rate/Rhythm: 93 SR with PVCs    BP: sitting 112/65    SaO2:   MODE:  Ambulation: 1050 ft   POST:  Rate/Rhythm: 103 ST with PVCs    BP: sitting 112/68     SaO2:   Pt walked earlier independently 3 laps. Walked again with me without problems, feels well. Pt is happy he can walk long distances again. Sts it has probably been a year since he walked so far. Discussed low sodium, low fluid, daily wts, smoking cessation, managing heart failure. Pt likes to tell all he knows however he is sometimes off base. He was appreciative of the information I gave him and he is motivated to quit smoking and watch his sodium. Encouraged him to read information and watch videos. He can walk more independently. Will f/u. [phone]  Harriet Masson CES, ACSM 05/16/2016 12:18 PM

## 2016-05-16 NOTE — Progress Notes (Signed)
Subjective:  Denies any chest pain or shortness of breath. Diuresing well on IV milrinone. Renal function remains stable.  Objective:  Vital Signs in the last 24 hours: Temp:  [97.2 F (36.2 C)-97.8 F (36.6 C)] 97.4 F (36.3 C) (06/21 1111) Pulse Rate:  [86-95] 86 (06/21 0819) Resp:  [20-35] 20 (06/21 1111) BP: (104-118)/(58-79) 112/65 mmHg (06/21 1111) SpO2:  [92 %-100 %] 96 % (06/21 1111) Weight:  [87.6 kg (193 lb 2 oz)] 87.6 kg (193 lb 2 oz) (06/21 0404)  Intake/Output from previous day: 06/20 0701 - 06/21 0700 In: 253 [I.V.:253] Out: 6200 [Urine:6200] Intake/Output from this shift: Total I/O In: 273.2 [P.O.:240; I.V.:33.2] Out: 1700 [Urine:1700]  Physical Exam: Neck: no adenopathy, no carotid bruit and supple, symmetrical, trachea midline Lungs: clear to auscultation bilaterally Heart: regular rate and rhythm, S1, S2 normal and Soft systolic murmur and S3 gallop noted Abdomen: soft, non-tender; bowel sounds normal; no masses,  no organomegaly Extremities: No clubbing cyanosis 1+ edema noted  Lab Results:  Recent Labs  05/15/16 0408  WBC 6.5  HGB 13.3  PLT 146*    Recent Labs  05/15/16 0408 05/16/16 0420  NA 134* 133*  K 4.7 3.9  CL 88* 85*  CO2 37* 38*  GLUCOSE 146* 192*  BUN 35* 31*  CREATININE 1.65* 1.51*   No results for input(s): TROPONINI in the last 72 hours.  Invalid input(s): CK, MB Hepatic Function Panel  Recent Labs  05/15/16 0408  PROT 6.8  ALBUMIN 2.9*  AST 24  ALT 18  ALKPHOS 82  BILITOT 3.1*   No results for input(s): CHOL in the last 72 hours. No results for input(s): PROTIME in the last 72 hours.  Imaging: Imaging results have been reviewed and No results found.  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

## 2016-05-17 LAB — BASIC METABOLIC PANEL
ANION GAP: 9 (ref 5–15)
BUN: 28 mg/dL — ABNORMAL HIGH (ref 6–20)
CALCIUM: 9 mg/dL (ref 8.9–10.3)
CHLORIDE: 84 mmol/L — AB (ref 101–111)
CO2: 34 mmol/L — AB (ref 22–32)
CREATININE: 1.47 mg/dL — AB (ref 0.61–1.24)
GFR, EST NON AFRICAN AMERICAN: 54 mL/min — AB (ref >60)
Glucose, Bld: 188 mg/dL — ABNORMAL HIGH (ref 65–99)
Potassium: 4 mmol/L (ref 3.5–5.1)
Sodium: 127 mmol/L — ABNORMAL LOW (ref 135–145)

## 2016-05-17 LAB — CARBOXYHEMOGLOBIN
CARBOXYHEMOGLOBIN: 2.3 % — AB (ref 0.5–1.5)
Carboxyhemoglobin: 2.5 % — ABNORMAL HIGH (ref 0.5–1.5)
Carboxyhemoglobin: 1.9 % — ABNORMAL HIGH (ref 0.5–1.5)
METHEMOGLOBIN: 0.6 % (ref 0.0–1.5)
METHEMOGLOBIN: 0.6 % (ref 0.0–1.5)
Methemoglobin: 0.9 % (ref 0.0–1.5)
O2 SAT: 70.3 %
O2 Saturation: 64.1 %
O2 Saturation: 65.9 %
TOTAL HEMOGLOBIN: 13.3 g/dL — AB (ref 13.5–18.0)
TOTAL HEMOGLOBIN: 14.9 g/dL (ref 13.5–18.0)
Total hemoglobin: 15.4 g/dL (ref 13.5–18.0)

## 2016-05-17 LAB — PROTIME-INR
INR: 2.14 — ABNORMAL HIGH (ref 0.00–1.49)
Prothrombin Time: 23.7 seconds — ABNORMAL HIGH (ref 11.6–15.2)

## 2016-05-17 LAB — MAGNESIUM: MAGNESIUM: 1.8 mg/dL (ref 1.7–2.4)

## 2016-05-17 MED ORDER — MAGNESIUM SULFATE 2 GM/50ML IV SOLN
2.0000 g | Freq: Once | INTRAVENOUS | Status: AC
  Administered 2016-05-17: 2 g via INTRAVENOUS
  Filled 2016-05-17: qty 50

## 2016-05-17 MED ORDER — MILRINONE LACTATE IN DEXTROSE 20-5 MG/100ML-% IV SOLN
0.1250 ug/kg/min | INTRAVENOUS | Status: DC
  Administered 2016-05-17: 0.25 ug/kg/min via INTRAVENOUS
  Filled 2016-05-17: qty 100

## 2016-05-17 MED ORDER — ISOSORB DINITRATE-HYDRALAZINE 20-37.5 MG PO TABS
1.0000 | ORAL_TABLET | Freq: Three times a day (TID) | ORAL | Status: DC
  Administered 2016-05-17 – 2016-05-18 (×3): 1 via ORAL
  Filled 2016-05-17 (×3): qty 1

## 2016-05-17 MED ORDER — WARFARIN SODIUM 10 MG PO TABS
10.0000 mg | ORAL_TABLET | Freq: Once | ORAL | Status: AC
  Administered 2016-05-17: 10 mg via ORAL
  Filled 2016-05-17: qty 1

## 2016-05-17 NOTE — Progress Notes (Signed)
Subjective:  Doing well denies any chest pain or shortness of breath. Diuresing well remains on milrinone. States has appointment with parole officer tomorrow.  Objective:  Vital Signs in the last 24 hours: Temp:  [97.2 F (36.2 C)-98.4 F (36.9 C)] 97.8 F (36.6 C) (06/22 0437) Pulse Rate:  [86-93] 93 (06/21 1708) Resp:  [18-21] 18 (06/21 1708) BP: (92-112)/(53-77) 99/60 mmHg (06/22 0437) SpO2:  [95 %-98 %] 95 % (06/22 0437) Weight:  [83.3 kg (183 lb 10.3 oz)] 83.3 kg (183 lb 10.3 oz) (06/22 0438)  Intake/Output from previous day: 06/21 0701 - 06/22 0700 In: 1545.8 [P.O.:1290; I.V.:255.8] Out: 6450 [Urine:6450] Intake/Output from this shift:    Physical Exam: Neck: no adenopathy, no carotid bruit, no JVD and supple, symmetrical, trachea midline Lungs: clear to auscultation bilaterally Heart: regular rate and rhythm, S1, S2 normal and Soft systolic murmur noted Abdomen: soft, non-tender; bowel sounds normal; no masses,  no organomegaly Extremities: extremities normal, atraumatic, no cyanosis or edema  Lab Results:  Recent Labs  05/15/16 0408  WBC 6.5  HGB 13.3  PLT 146*    Recent Labs  05/15/16 0408 05/16/16 0420  NA 134* 133*  K 4.7 3.9  CL 88* 85*  CO2 37* 38*  GLUCOSE 146* 192*  BUN 35* 31*  CREATININE 1.65* 1.51*   No results for input(s): TROPONINI in the last 72 hours.  Invalid input(s): CK, MB Hepatic Function Panel  Recent Labs  05/15/16 0408  PROT 6.8  ALBUMIN 2.9*  AST 24  ALT 18  ALKPHOS 82  BILITOT 3.1*   No results for input(s): CHOL in the last 72 hours. No results for input(s): PROTIME in the last 72 hours.  Imaging: Imaging results have been reviewed and No results found.  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   LOS: 4 days    Rinaldo Cloud 05/17/2016, 7:31 AM

## 2016-05-17 NOTE — Progress Notes (Signed)
CARDIAC REHAB PHASE I   PRE:  Rate/Rhythm: 93 SR  BP:  Sitting: 113/71        SaO2: 100 RA  MODE:  Ambulation: 1050 ft   POST:  Rate/Rhythm: 103 ST c/PVCs  BP:  Sitting: 130/74         SaO2: 97 RA  Pt in bed, states he feels well, pt states he spoke with the doctors this morning about restarting his torsemide. Pt expressed some concern that he has been taking 100 mg torsemide twice a day at home since he noticed increased swelling in his legs. Advised pt to notify MD of this. Pt ambulated 1050 ft on RA, IV, independent, steady gait, tolerated well, with no complaints. Pt able to perform CHF education teach back. Briefly discussed phase 2 cardiac rehab. Pt expressed some interest. Will readdress if pt able to wean off milrinone. Pt to recliner after walk, feet elevated, call bell within reach. Will follow.   [id number] Joylene Grapes, RN, BSN 05/17/2016 12:15 PM

## 2016-05-17 NOTE — Progress Notes (Signed)
Advanced Heart Failure Rounding Note   Subjective:    Remains on milrinone 0.25. Echo with severe biventricular dysfunction. LVEF 10%  Diuresing briskly.  Negative 4.9 liters. Weight down another 10 pounds.  Co-ox 70%, Cr 1.51  Wants to go home tomorrow. Says he has an appointment that he needs to go to at 11.   Objective:   Weight Range:  Vital Signs:   Temp:  [97.2 F (36.2 C)-98.4 F (36.9 C)] 97.8 F (36.6 C) (06/22 0437) Pulse Rate:  [86-93] 93 (06/21 1708) Resp:  [18-21] 18 (06/21 1708) BP: (92-112)/(53-77) 99/60 mmHg (06/22 0437) SpO2:  [95 %-98 %] 95 % (06/22 0437) Weight:  [183 lb 10.3 oz (83.3 kg)] 183 lb 10.3 oz (83.3 kg) (06/22 0438) Last BM Date: 05/14/16  Weight change: Filed Weights   05/15/16 0400 05/16/16 0404 05/17/16 0438  Weight: 202 lb 9.6 oz (91.899 kg) 193 lb 2 oz (87.6 kg) 183 lb 10.3 oz (83.3 kg)    Intake/Output:   Intake/Output Summary (Last 24 hours) at 05/17/16 0725 Last data filed at 05/17/16 0700  Gross per 24 hour  Intake 1545.81 ml  Output   6450 ml  Net -4904.19 ml     Physical Exam: CVP 9-10  General:  Well appearing. No resp difficulty. In bed.  HEENT: normal Neck: supple. JVP ~10  . Carotids 2+ bilat; no bruits. No lymphadenopathy or thryomegaly appreciated. Cor: PMI laterally displaced. Regular rate & rhythm. +s3 Lungs: clear Abdomen: soft, nontender, nondistended. No hepatosplenomegaly. No bruits or masses. Good bowel sounds. Extremities: no cyanosis, clubbing, rash, R and LLE 1+ edema. Ted hose.  Neuro: alert & orientedx3, cranial nerves grossly intact. moves all 4 extremities w/o difficulty. Affect pleasant  Telemetry: NSR PVCs 90s   Labs: Basic Metabolic Panel:  Recent Labs Lab 05/13/16 1210 05/13/16 1530 05/13/16 1645 05/14/16 0320 05/15/16 0408 05/16/16 0420  NA  --  133* 135 135 134* 133*  K  --  3.2* 3.1* 3.2* 4.7 3.9  CL  --  90* 96* 91* 88* 85*  CO2  --  33* 31 33* 37* 38*  GLUCOSE  --  150*  128* 183* 146* 192*  BUN  --  34* 32* 33* 35* 31*  CREATININE  --  1.58* 1.45* 1.47* 1.65* 1.51*  CALCIUM  --  8.7* 7.9* 8.9 9.2 9.4  MG 1.8  --   --   --  1.7  --     Liver Function Tests:  Recent Labs Lab 05/15/16 0408  AST 24  ALT 18  ALKPHOS 82  BILITOT 3.1*  PROT 6.8  ALBUMIN 2.9*   No results for input(s): LIPASE, AMYLASE in the last 168 hours. No results for input(s): AMMONIA in the last 168 hours.  CBC:  Recent Labs Lab 05/13/16 0700 05/15/16 0408  WBC 6.1 6.5  NEUTROABS 4.2  --   HGB 14.5 13.3  HCT 42.9 40.9  MCV 84.1 86.3  PLT 158 146*    Cardiac Enzymes: No results for input(s): CKTOTAL, CKMB, CKMBINDEX, TROPONINI in the last 168 hours.  BNP: BNP (last 3 results)  Recent Labs  05/13/16 0700 05/15/16 0408 05/16/16 0420  BNP 3255.2* 2707.4* 2453.6*    ProBNP (last 3 results) No results for input(s): PROBNP in the last 8760 hours.    Other results:  Imaging: No results found.   Medications:     Scheduled Medications: . allopurinol  100 mg Oral Daily  . amiodarone  200 mg Oral Daily  .  digoxin  0.125 mg Oral Daily  . furosemide  80 mg Intravenous BID  . isosorbide-hydrALAZINE  1 tablet Oral BID AC  . losartan  12.5 mg Oral Daily  . nicotine  14 mg Transdermal Daily  . potassium chloride  40 mEq Oral BID  . sodium chloride flush  10-40 mL Intracatheter Q12H  . sodium chloride flush  3 mL Intravenous Q12H  . spironolactone  25 mg Oral Daily  . Warfarin - Pharmacist Dosing Inpatient   Does not apply q1800    Infusions: . milrinone 0.375 mcg/kg/min (05/17/16 0022)    PRN Medications: sodium chloride, acetaminophen, ALPRAZolam, ondansetron (ZOFRAN) IV, sodium chloride flush, sodium chloride flush, zolpidem   Assessment:   1. Cardiogenic shock 2. Acute on chronic systolic HF due to NICM    -LVEF 10% RV severely HK 3. Acute on chronic renal failure, stage 3 4. PAF - now in NSR 5. H/o LV thrombus 6. H/o ETOH and substance  abuse - now quit 7. Tobacco use   Plan/Discussion:    LVEF 10% with severe RV dysfunction.   CO-OX 70%. CVP 9-10. Cut back milrinone 0.25 mcg. Continue lasix twice a day. Prior to admit he was taking torsemide 50 mg twice a day.  Continue sprio 25 daily. Give 40 meq twice a day. Continue bidil 1 tab tid. Continue 12.5 mg losartan daily. On dig check level tomorrow. Renal function ok.    Continue anti-coagulation for PAF and LV thrombus. INR 2.14  On Amio 200 mg daily for PAF and history of VT.   Advanced therapies may be an option though RV could be a limiting factor for LVAD.  Social situation also a challenge but it seems like he has multiple family members whom he might be able to rely on.   Cardiac rehab following.   Says he needs to d/c tomorrow for an appointment at 11. Will try to wean milrinone. May be able to stop IV lasix later today and switch to torsemide 50 mg twice a day.   Length of Stay: 4  Amy Clegg NP-C  05/17/2016, 7:25 AM  Advanced Heart Failure Team Pager [phone] (M-F; 7a - 4p)  Please contact [facility] for night-coverage after hours (4p -7a ) and weekends on [email or web address]  Patient seen with NP, agree with the above note.  Good diuresis again yesterday, CVP down to 9-10 with good co-ox.   - Decrease milrinone to 0.25 then repeat co-ox at noon today and will wean again if it remains ok.  - Continue current Bidil, losartan, spironolactone.  No BP room to titrate.  - Continue digoxin, check level in am.  - Continue IV Lasix today, probably stop IV and start torsemide tomorrow.  Says that at home, he had been taking around 100 mg twice a day of torsemide for the 2 weeks prior to admission.  Would probably send him home with torsemide 80 mg bid for the time being with close followup.   INR therapeutic.    He has appt with parole officer tomorrow.  Ideally, probably would need another day in hospital to sort out milrinone need (wean versus home).  Suspect we will  be able to wean off, however.  I asked him to try to reschedule appt.  If he cannot, will probably have to let him go tomorrow.  Will continue to work on milrinone wean.   Marca Ancona 05/17/2016 11:58 AM

## 2016-05-17 NOTE — Progress Notes (Signed)
  Tolerated milrinone wean to 0.125 mcg with CO-OX 66%.   Stop milrinone.   CO-OX in am   Amy Clegg NP-C  3:53 PM

## 2016-05-17 NOTE — Progress Notes (Signed)
ANTICOAGULATION CONSULT NOTE - Follow Up Consult  Pharmacy Consult for Coumadin Indication: atrial fibrillation and LV thrombus  No Known Allergies  Patient Measurements: Height: 5\' 7"  (170.2 cm) Weight: 183 lb 10.3 oz (83.3 kg) IBW/kg (Calculated) : 66.1  Vital Signs: Temp: 97.9 F (36.6 C) (06/22 0747) Temp Source: Oral (06/22 0747) BP: 108/64 mmHg (06/22 0925) Pulse Rate: 90 (06/22 0747)  Labs:  Recent Labs  05/15/16 0408 05/16/16 0420 05/17/16 0548  HGB 13.3  --   --   HCT 40.9  --   --   PLT 146*  --   --   LABPROT 34.1* 28.3* 23.7*  INR 3.47* 2.71* 2.14*  CREATININE 1.65* 1.51* 1.47*    Estimated Creatinine Clearance: 62.8 mL/min (by C-G formula based on Cr of 1.47).  Assessment: 49yom on coumadin pta for afib and hx LV thrombus, admitted with volume overload. INR above goal on admission and coumadin held. INR down to 2.7 yesterday and coumadin resumed. Today's INR is therapeutic at 2.14 but trending down.  Home dose: 5mg  daily except 10mg  Wed/Sun  Goal of Therapy:  INR 2-3 Monitor platelets by anticoagulation protocol: Yes   Plan:  1) Coumadin 10mg  x 1 2) Daily INR  Fredrik Rigger 05/17/2016,11:01 AM

## 2016-05-18 LAB — BASIC METABOLIC PANEL
ANION GAP: 5 (ref 5–15)
BUN: 26 mg/dL — ABNORMAL HIGH (ref 6–20)
CO2: 34 mmol/L — AB (ref 22–32)
CREATININE: 1.4 mg/dL — AB (ref 0.61–1.24)
Calcium: 9 mg/dL (ref 8.9–10.3)
Chloride: 93 mmol/L — ABNORMAL LOW (ref 101–111)
GFR calc non Af Amer: 58 mL/min — ABNORMAL LOW (ref >60)
Glucose, Bld: 140 mg/dL — ABNORMAL HIGH (ref 65–99)
Potassium: 4 mmol/L (ref 3.5–5.1)
SODIUM: 132 mmol/L — AB (ref 135–145)

## 2016-05-18 LAB — CARBOXYHEMOGLOBIN
Carboxyhemoglobin: 1.6 % — ABNORMAL HIGH (ref 0.5–1.5)
METHEMOGLOBIN: 0.7 % (ref 0.0–1.5)
O2 Saturation: 60.2 %
TOTAL HEMOGLOBIN: 14.9 g/dL (ref 13.5–18.0)

## 2016-05-18 LAB — DIGOXIN LEVEL: Digoxin Level: 0.5 ng/mL — ABNORMAL LOW (ref 0.8–2.0)

## 2016-05-18 LAB — PROTIME-INR
INR: 2.09 — AB (ref 0.00–1.49)
PROTHROMBIN TIME: 23.3 s — AB (ref 11.6–15.2)

## 2016-05-18 MED ORDER — FUROSEMIDE 10 MG/ML IJ SOLN
80.0000 mg | Freq: Two times a day (BID) | INTRAMUSCULAR | Status: AC
  Administered 2016-05-18: 80 mg via INTRAVENOUS

## 2016-05-18 MED ORDER — WARFARIN SODIUM 7.5 MG PO TABS
7.5000 mg | ORAL_TABLET | Freq: Every day | ORAL | Status: AC
Start: 2016-05-18 — End: ?

## 2016-05-18 MED ORDER — LOSARTAN POTASSIUM 25 MG PO TABS: 12.5000 mg | ORAL_TABLET | Freq: Every day | ORAL | Status: DC

## 2016-05-18 MED ORDER — POTASSIUM CHLORIDE CRYS ER 20 MEQ PO TBCR: 40.0000 meq | EXTENDED_RELEASE_TABLET | Freq: Every day | ORAL | Status: AC

## 2016-05-18 MED ORDER — WARFARIN SODIUM 7.5 MG PO TABS
7.5000 mg | ORAL_TABLET | Freq: Once | ORAL | Status: AC
  Administered 2016-05-18: 7.5 mg via ORAL
  Filled 2016-05-18: qty 1

## 2016-05-18 MED ORDER — TORSEMIDE 20 MG PO TABS: 50.0000 mg | ORAL_TABLET | Freq: Two times a day (BID) | ORAL | Status: DC

## 2016-05-18 MED ORDER — ISOSORB DINITRATE-HYDRALAZINE 20-37.5 MG PO TABS: 1.0000 | ORAL_TABLET | Freq: Three times a day (TID) | ORAL | Status: DC

## 2016-05-18 MED ORDER — SPIRONOLACTONE 25 MG PO TABS: 25.0000 mg | ORAL_TABLET | Freq: Every day | ORAL | Status: AC

## 2016-05-18 NOTE — Discharge Summary (Signed)
Discharge summary dictated on 05/18/2016, dictation number is [id number]

## 2016-05-18 NOTE — Discharge Instructions (Signed)
Atrial Fibrillation °Atrial fibrillation is a type of irregular or rapid heartbeat (arrhythmia). In atrial fibrillation, the heart quivers continuously in a chaotic pattern. This occurs when parts of the heart receive disorganized signals that make the heart unable to pump blood normally. This can increase the risk for stroke, heart failure, and other heart-related conditions. There are different types of atrial fibrillation, including: °· Paroxysmal atrial fibrillation. This type starts suddenly, and it usually stops on its own shortly after it starts. °· Persistent atrial fibrillation. This type often lasts longer than a week. It may stop on its own or with treatment. °· Long-lasting persistent atrial fibrillation. This type lasts longer than 12 months. °· Permanent atrial fibrillation. This type does not go away. °Talk with your health care provider to learn about the type of atrial fibrillation that you have. °CAUSES °This condition is caused by some heart-related conditions or procedures, including: °· A heart attack. °· Coronary artery disease. °· Heart failure. °· Heart valve conditions. °· High blood pressure. °· Inflammation of the sac that surrounds the heart (pericarditis). °· Heart surgery. °· Certain heart rhythm disorders, such as Wolf-Parkinson-White syndrome. °Other causes include: °· Pneumonia. °· Obstructive sleep apnea. °· Blockage of an artery in the lungs (pulmonary embolism, or PE). °· Lung cancer. °· Chronic lung disease. °· Thyroid problems, especially if the thyroid is overactive (hyperthyroidism). °· Caffeine. °· Excessive alcohol use or illegal drug use. °· Use of some medicines, including certain decongestants and diet pills. °Sometimes, the cause cannot be found. °RISK FACTORS °This condition is more likely to develop in: °· People who are older in age. °· People who smoke. °· People who have diabetes mellitus. °· People who are overweight (obese). °· Athletes who exercise  vigorously. °SYMPTOMS °Symptoms of this condition include: °· A feeling that your heart is beating rapidly or irregularly. °· A feeling of discomfort or pain in your chest. °· Shortness of breath. °· Sudden light-headedness or weakness. °· Getting tired easily during exercise. °In some cases, there are no symptoms. °DIAGNOSIS °Your health care provider may be able to detect atrial fibrillation when taking your pulse. If detected, this condition may be diagnosed with: °· An electrocardiogram (ECG). °· A Holter monitor test that records your heartbeat patterns over a 24-hour period. °· Transthoracic echocardiogram (TTE) to evaluate how blood flows through your heart. °· Transesophageal echocardiogram (TEE) to view more detailed images of your heart. °· A stress test. °· Imaging tests, such as a CT scan or chest X-ray. °· Blood tests. °TREATMENT °The main goals of treatment are to prevent blood clots from forming and to keep your heart beating at a normal rate and rhythm. The type of treatment that you receive depends on many factors, such as your underlying medical conditions and how you feel when you are experiencing atrial fibrillation. °This condition may be treated with: °· Medicine to slow down the heart rate, bring the heart's rhythm back to normal, or prevent clots from forming. °· Electrical cardioversion. This is a procedure that resets your heart's rhythm by delivering a controlled, low-energy shock to the heart through your skin. °· Different types of ablation, such as catheter ablation, catheter ablation with pacemaker, or surgical ablation. These procedures destroy the heart tissues that send abnormal signals. When the pacemaker is used, it is placed under your skin to help your heart beat in a regular rhythm. °HOME CARE INSTRUCTIONS °· Take over-the counter and prescription medicines only as told by your health care provider. °·   If your health care provider prescribed a blood-thinning medicine  (anticoagulant), take it exactly as told. Taking too much blood-thinning medicine can cause bleeding. If you do not take enough blood-thinning medicine, you will not have the protection that you need against stroke and other problems. °· Do not use tobacco products, including cigarettes, chewing tobacco, and e-cigarettes. If you need help quitting, ask your health care provider. °· If you have obstructive sleep apnea, manage your condition as told by your health care provider. °· Do not drink alcohol. °· Do not drink beverages that contain caffeine, such as coffee, soda, and tea. °· Maintain a healthy weight. Do not use diet pills unless your health care provider approves. Diet pills may make heart problems worse. °· Follow diet instructions as told by your health care provider. °· Exercise regularly as told by your health care provider. °· Keep all follow-up visits as told by your health care provider. This is important. °PREVENTION °· Avoid drinking beverages that contain caffeine or alcohol. °· Avoid certain medicines, especially medicines that are used for breathing problems. °· Avoid certain herbs and herbal medicines, such as those that contain ephedra or ginseng. °· Do not use illegal drugs, such as cocaine and amphetamines. °· Do not smoke. °· Manage your high blood pressure. °SEEK MEDICAL CARE IF: °· You notice a change in the rate, rhythm, or strength of your heartbeat. °· You are taking an anticoagulant and you notice increased bruising. °· You tire more easily when you exercise or exert yourself. °SEEK IMMEDIATE MEDICAL CARE IF: °· You have chest pain, abdominal pain, sweating, or weakness. °· You feel nauseous. °· You notice blood in your vomit, bowel movement, or urine. °· You have shortness of breath. °· You suddenly have swollen feet and ankles. °· You feel dizzy. °· You have sudden weakness or numbness of the face, arm, or leg, especially on one side of the body. °· You have trouble speaking,  trouble understanding, or both (aphasia). °· Your face or your eyelid droops on one side. °These symptoms may represent a serious problem that is an emergency. Do not wait to see if the symptoms will go away. Get medical help right away. Call your local emergency services (911 in the [place] Do not drive yourself to the hospital. °  °This information is not intended to replace advice given to you by your health care provider. Make sure you discuss any questions you have with your health care provider. °  °Document Released: 11/12/2005 Document Revised: 08/03/2015 Document Reviewed: 03/09/2015 °[employer] Interactive Patient Education ©2016 [employer] °Heart Failure °Heart failure is a condition in which the heart has trouble pumping blood. This means your heart does not pump blood efficiently for your body to work well. In some cases of heart failure, fluid may back up into your lungs or you may have swelling (edema) in your lower legs. Heart failure is usually a long-term (chronic) condition. It is important for you to take good care of yourself and follow your health care provider's treatment plan. °CAUSES  °Some health conditions can cause heart failure. Those health conditions include: °· High blood pressure (hypertension). Hypertension causes the heart muscle to work harder than normal. When pressure in the blood vessels is high, the heart needs to pump (contract) with more force in order to circulate blood throughout the body. High blood pressure eventually causes the heart to become stiff and weak. °· Coronary artery disease (CAD). CAD is the buildup of cholesterol and fat (  plaque) in the arteries of the heart. The blockage in the arteries deprives the heart muscle of oxygen and blood. This can cause chest pain and may lead to a heart attack. High blood pressure can also contribute to CAD. °· Heart attack (myocardial infarction). A heart attack occurs when one or more arteries in the heart become blocked. The  loss of oxygen damages the muscle tissue of the heart. When this happens, part of the heart muscle dies. The injured tissue does not contract as well and weakens the heart's ability to pump blood. °· Abnormal heart valves. When the heart valves do not open and close properly, it can cause heart failure. This makes the heart muscle pump harder to keep the blood flowing. °· Heart muscle disease (cardiomyopathy or myocarditis). Heart muscle disease is damage to the heart muscle from a variety of causes. These can include drug or alcohol abuse, infections, or unknown reasons. These can increase the risk of heart failure. °· Lung disease. Lung disease makes the heart work harder because the lungs do not work properly. This can cause a strain on the heart, leading it to fail. °· Diabetes. Diabetes increases the risk of heart failure. High blood sugar contributes to high fat (lipid) levels in the blood. Diabetes can also cause slow damage to tiny blood vessels that carry important nutrients to the heart muscle. When the heart does not get enough oxygen and food, it can cause the heart to become weak and stiff. This leads to a heart that does not contract efficiently. °· Other conditions can contribute to heart failure. These include abnormal heart rhythms, thyroid problems, and low blood counts (anemia). °Certain unhealthy behaviors can increase the risk of heart failure, including: °· Being overweight. °· Smoking or chewing tobacco. °· Eating foods high in fat and cholesterol. °· Abusing illicit drugs or alcohol. °· Lacking physical activity. °SYMPTOMS  °Heart failure symptoms may vary and can be hard to detect. Symptoms may include: °· Shortness of breath with activity, such as climbing stairs. °· Persistent cough. °· Swelling of the feet, ankles, legs, or abdomen. °· Unexplained weight gain. °· Difficulty breathing when lying flat (orthopnea). °· Waking from sleep because of the need to sit up and get more air. °· Rapid  heartbeat. °· Fatigue and loss of energy. °· Feeling light-headed, dizzy, or close to fainting. °· Loss of appetite. °· Nausea. °· Increased urination during the night (nocturia). °DIAGNOSIS  °A diagnosis of heart failure is based on your history, symptoms, physical examination, and diagnostic tests. Diagnostic tests for heart failure may include: °· Echocardiography. °· Electrocardiography. °· Chest X-ray. °· Blood tests. °· Exercise stress test. °· Cardiac angiography. °· Radionuclide scans. °TREATMENT  °Treatment is aimed at managing the symptoms of heart failure. Medicines, behavioral changes, or surgical intervention may be necessary to treat heart failure. °· Medicines to help treat heart failure may include: °¨ Angiotensin-converting enzyme (ACE) inhibitors. This type of medicine blocks the effects of a blood protein called angiotensin-converting enzyme. ACE inhibitors relax (dilate) the blood vessels and help lower blood pressure. °¨ Angiotensin receptor blockers (ARBs). This type of medicine blocks the actions of a blood protein called angiotensin. Angiotensin receptor blockers dilate the blood vessels and help lower blood pressure. °¨ Water pills (diuretics). Diuretics cause the kidneys to remove salt and water from the blood. The extra fluid is removed through urination. This loss of extra fluid lowers the volume of blood the heart pumps. °¨ Beta blockers. These prevent the heart   from beating too fast and improve heart muscle strength. °¨ Digitalis. This increases the force of the heartbeat. °· Healthy behavior changes include: °¨ Obtaining and maintaining a healthy weight. °¨ Stopping smoking or chewing tobacco. °¨ Eating heart-healthy foods. °¨ Limiting or avoiding alcohol. °¨ Stopping illicit drug use. °¨ Physical activity as directed by your health care provider. °· Surgical treatment for heart failure may include: °¨ A procedure to open blocked arteries, repair damaged heart valves, or remove damaged  heart muscle tissue. °¨ A pacemaker to improve heart muscle function and control certain abnormal heart rhythms. °¨ An internal cardioverter defibrillator to treat certain serious abnormal heart rhythms. °¨ A left ventricular assist device (LVAD) to assist the pumping ability of the heart. °HOME CARE INSTRUCTIONS  °· Take medicines only as directed by your health care provider. Medicines are important in reducing the workload of your heart, slowing the progression of heart failure, and improving your symptoms. °¨ Do not stop taking your medicine unless directed by your health care provider. °¨ Do not skip any dose of medicine. °¨ Refill your prescriptions before you run out of medicine. Your medicines are needed every day. °· Engage in moderate physical activity if directed by your health care provider. Moderate physical activity can benefit some people. The elderly and people with severe heart failure should consult with a health care provider for physical activity recommendations. °· Eat heart-healthy foods. Food choices should be free of trans fat and low in saturated fat, cholesterol, and salt (sodium). Healthy choices include fresh or frozen fruits and vegetables, fish, lean meats, legumes, fat-free or low-fat dairy products, and whole grain or high fiber foods. Talk to a dietitian to learn more about heart-healthy foods. °· Limit sodium if directed by your health care provider. Sodium restriction may reduce symptoms of heart failure in some people. Talk to a dietitian to learn more about heart-healthy seasonings. °· Use healthy cooking methods. Healthy cooking methods include roasting, grilling, broiling, baking, poaching, steaming, or stir-frying. Talk to a dietitian to learn more about healthy cooking methods. °· Limit fluids if directed by your health care provider. Fluid restriction may reduce symptoms of heart failure in some people. °· Weigh yourself every day. Daily weights are important in the early  recognition of excess fluid. You should weigh yourself every morning after you urinate and before you eat breakfast. Wear the same amount of clothing each time you weigh yourself. Record your daily weight. Provide your health care provider with your weight record. °· Monitor and record your blood pressure if directed by your health care provider. °· Check your pulse if directed by your health care provider. °· Lose weight if directed by your health care provider. Weight loss may reduce symptoms of heart failure in some people. °· Stop smoking or chewing tobacco. Nicotine makes your heart work harder by causing your blood vessels to constrict. Do not use nicotine gum or patches before talking to your health care provider. °· Keep all follow-up visits as directed by your health care provider. This is important. °· Limit alcohol intake to no more than 1 drink per day for nonpregnant women and 2 drinks per day for men. One drink equals 12 ounces of beer, 5 ounces of wine, or 1½ ounces of hard liquor. Drinking more than that is harmful to your heart. Tell your health care provider if you drink alcohol several times a week. Talk with your health care provider about whether alcohol is safe for you. If your   heart has already been damaged by alcohol or you have severe heart failure, drinking alcohol should be stopped completely. °· Stop illicit drug use. °· Stay up-to-date with immunizations. It is especially important to prevent respiratory infections through current pneumococcal and influenza immunizations. °· Manage other health conditions such as hypertension, diabetes, thyroid disease, or abnormal heart rhythms as directed by your health care provider. °· Learn to manage stress. °· Plan rest periods when fatigued. °· Learn strategies to manage high temperatures. If the weather is extremely hot: °¨ Avoid vigorous physical activity. °¨ Use air conditioning or fans or seek a cooler location. °¨ Avoid caffeine and  alcohol. °¨ Wear loose-fitting, lightweight, and light-colored clothing. °· Learn strategies to manage cold temperatures. If the weather is extremely cold: °¨ Avoid vigorous physical activity. °¨ Layer clothes. °¨ Wear mittens or gloves, a hat, and a scarf when going outside. °¨ Avoid alcohol. °· Obtain ongoing education and support as needed. °· Participate in or seek rehabilitation as needed to maintain or improve independence and quality of life. °SEEK MEDICAL CARE IF:  °· You have a rapid weight gain. °· You have increasing shortness of breath that is unusual for you. °· You are unable to participate in your usual physical activities. °· You tire easily. °· You cough more than normal, especially with physical activity. °· You have any or more swelling in areas such as your hands, feet, ankles, or abdomen. °· You are unable to sleep because it is hard to breathe. °· You feel like your heart is beating fast (palpitations). °· You become dizzy or light-headed upon standing up. °SEEK IMMEDIATE MEDICAL CARE IF:  °· You have difficulty breathing. °· There is a change in mental status such as decreased alertness or difficulty with concentration. °· You have a pain or discomfort in your chest. °· You have an episode of fainting (syncope). °MAKE SURE YOU:  °· Understand these instructions. °· Will watch your condition. °· Will get help right away if you are not doing well or get worse. °  °This information is not intended to replace advice given to you by your health care provider. Make sure you discuss any questions you have with your health care provider. °  °Document Released: 11/12/2005 Document Revised: 03/29/2015 Document Reviewed: 12/12/2012 °[employer] Interactive Patient Education ©2016 [employer] ° °

## 2016-05-18 NOTE — Progress Notes (Signed)
ANTICOAGULATION CONSULT NOTE - Follow Up Consult  Pharmacy Consult for Coumadin Indication: atrial fibrillation and LV thrombus  No Known Allergies  Patient Measurements: Height: 5\' 7"  (170.2 cm) Weight: 177 lb 3.2 oz (80.377 kg) IBW/kg (Calculated) : 66.1  Vital Signs: Temp: 97.5 F (36.4 C) (06/23 0915) Temp Source: Oral (06/23 0915) BP: 107/70 mmHg (06/23 0737)  Labs:  Recent Labs  05/16/16 0420 05/17/16 0548 05/18/16 0420  LABPROT 28.3* 23.7* 23.3*  INR 2.71* 2.14* 2.09*  CREATININE 1.51* 1.47* 1.40*    Estimated Creatinine Clearance: 64.8 mL/min (by C-G formula based on Cr of 1.4).  Assessment: 49yom on coumadin pta for afib and hx LV thrombus, admitted with volume overload. INR above goal on admission and coumadin held. INR down to 2.7 on 6/21 and coumadin resumed.Today's INR is therapeutic at 2.09 but continues to trend down.  Home dose: 5mg  daily except 10mg  Wed/Sun  Goal of Therapy:  INR 2-3 Monitor platelets by anticoagulation protocol: Yes   Plan:  Coumadin 7.5mg  x 1 prior to d/c today then resume home dose  Fredrik Rigger 05/18/2016,11:04 AM

## 2016-05-18 NOTE — Progress Notes (Signed)
Advanced Heart Failure Rounding Note   Subjective:    Echo with severe biventricular dysfunction. LVEF 10%  Diuresing briskly.  Negative 3.1 liters. Weight down another 6 pounds.  Co-ox 60%, Cr 1.4.  He has now been off milrinone overnight and remains stable.   Needs to go home today.    Objective:   Weight Range:  Vital Signs:   Temp:  [97.7 F (36.5 C)-98.2 F (36.8 C)] 97.7 F (36.5 C) (06/23 0429) Pulse Rate:  [90] 90 (06/22 0747) Resp:  [18-20] 20 (06/23 0429) BP: (88-130)/(54-97) 92/59 mmHg (06/23 0429) SpO2:  [92 %-99 %] 98 % (06/23 0429) Weight:  [177 lb 3.2 oz (80.377 kg)] 177 lb 3.2 oz (80.377 kg) (06/23 0429) Last BM Date: 05/16/16  Weight change: Filed Weights   05/16/16 0404 05/17/16 0438 05/18/16 0429  Weight: 193 lb 2 oz (87.6 kg) 183 lb 10.3 oz (83.3 kg) 177 lb 3.2 oz (80.377 kg)    Intake/Output:   Intake/Output Summary (Last 24 hours) at 05/18/16 0741 Last data filed at 05/18/16 0600  Gross per 24 hour  Intake 1079.08 ml  Output   4205 ml  Net -3125.92 ml     Physical Exam: CVP 12  General:  Well appearing. No resp difficulty. In bed.  HEENT: normal Neck: supple. JVP ~10 -11 . Carotids 2+ bilat; no bruits. No lymphadenopathy or thryomegaly appreciated. Cor: PMI laterally displaced. Regular rate & rhythm. +s3 Lungs: clear Abdomen: soft, nontender, nondistended. No hepatosplenomegaly. No bruits or masses. Good bowel sounds. Extremities: no cyanosis, clubbing, rash, R and LLE trace edema. Ted hose.  Neuro: alert & orientedx3, cranial nerves grossly intact. moves all 4 extremities w/o difficulty. Affect pleasant  Telemetry: NSR PVCs 90s   Labs: Basic Metabolic Panel:  Recent Labs Lab 05/13/16 1210  05/14/16 0320 05/15/16 0408 05/16/16 0420 05/17/16 0548 05/18/16 0420  NA  --   < > 135 134* 133* 127* 132*  K  --   < > 3.2* 4.7 3.9 4.0 4.0  CL  --   < > 91* 88* 85* 84* 93*  CO2  --   < > 33* 37* 38* 34* 34*  GLUCOSE  --   < >  183* 146* 192* 188* 140*  BUN  --   < > 33* 35* 31* 28* 26*  CREATININE  --   < > 1.47* 1.65* 1.51* 1.47* 1.40*  CALCIUM  --   < > 8.9 9.2 9.4 9.0 9.0  MG 1.8  --   --  1.7  --  1.8  --   < > = values in this interval not displayed.  Liver Function Tests:  Recent Labs Lab 05/15/16 0408  AST 24  ALT 18  ALKPHOS 82  BILITOT 3.1*  PROT 6.8  ALBUMIN 2.9*   No results for input(s): LIPASE, AMYLASE in the last 168 hours. No results for input(s): AMMONIA in the last 168 hours.  CBC:  Recent Labs Lab 05/13/16 0700 05/15/16 0408  WBC 6.1 6.5  NEUTROABS 4.2  --   HGB 14.5 13.3  HCT 42.9 40.9  MCV 84.1 86.3  PLT 158 146*    Cardiac Enzymes: No results for input(s): CKTOTAL, CKMB, CKMBINDEX, TROPONINI in the last 168 hours.  BNP: BNP (last 3 results)  Recent Labs  05/13/16 0700 05/15/16 0408 05/16/16 0420  BNP 3255.2* 2707.4* 2453.6*    ProBNP (last 3 results) No results for input(s): PROBNP in the last 8760 hours.    Other  results:  Imaging: No results found.   Medications:     Scheduled Medications: . allopurinol  100 mg Oral Daily  . amiodarone  200 mg Oral Daily  . digoxin  0.125 mg Oral Daily  . furosemide  80 mg Intravenous BID  . isosorbide-hydrALAZINE  1 tablet Oral TID  . losartan  12.5 mg Oral Daily  . nicotine  14 mg Transdermal Daily  . potassium chloride  40 mEq Oral BID  . sodium chloride flush  10-40 mL Intracatheter Q12H  . sodium chloride flush  3 mL Intravenous Q12H  . spironolactone  25 mg Oral Daily  . Warfarin - Pharmacist Dosing Inpatient   Does not apply q1800    Infusions:    PRN Medications: sodium chloride, acetaminophen, ALPRAZolam, ondansetron (ZOFRAN) IV, sodium chloride flush, sodium chloride flush, zolpidem   Assessment:   1. Cardiogenic shock 2. Acute on chronic systolic HF due to NICM    -LVEF 10% RV severely HK 3. Acute on chronic renal failure, stage 3 4. PAF - now in NSR 5. H/o LV thrombus 6. H/o  ETOH and substance abuse - now quit 7. Tobacco use   Plan/Discussion:    LVEF 10% with severe RV dysfunction.   Stable off milrinone with CO-OX 60%. CVP 12. Give one more dose of IV lasix then start  torsemide 50 mg twice a day.  Continue sprio 25 daily. Continue Bidil 1 tab tid. Continue 12.5 mg losartan daily, no BP room for titration. Dig level <.5. No beta blocker for now.   Renal function ok.    Continue anti-coagulation for PAF and LV thrombus with warfarin. INR 2.1  On Amio 200 mg daily for PAF and history of VT.   Advanced therapies may be an option though RV could be a limiting factor for LVAD.  Social situation also a challenge but it seems like he has multiple family members whom he might be able to rely on.   Home Meds Torsemide 50 mg twice a day with extra 50 mg for 3 pound weight gain KCl 40 daily Spiro 25 mg daily Losartan 12.5 mg daily Digoxin 0.125 mg daily Bidil 1 tab tid.  Amiod 200 mg daily.  warfarin  Will set up HF follow up .  Length of Stay: 5  Amy Clegg NP-C  05/18/2016, 7:41 AM  Advanced Heart Failure Team Pager [phone] (M-F; 7a - 4p)  Please contact [facility] for night-coverage after hours (4p -7a ) and weekends on [email or web address]  Patient seen with NP, agree with the above note.  We weaned him off milrinone relatively fast given need to get home for meeting with parole officer.  However, he seems to be tolerating wean and co-ox is good this morning.    He is down 30 lbs but still mildly volume overloaded with CVP 11-12.  Will give one more dose of IV Lasix this morning before he goes home.  Will send home on the cardiac medication regimen listed above in NP Clegg's note.  We'll see him back next week in CHF clinic, will give him appt prior to discharge.  Will also need INR followup.   Marca Ancona 05/18/2016 8:27 AM

## 2016-05-18 NOTE — Discharge Summary (Signed)
NAME:  Parker Johnson, BLANK NO.:  [id number]  MEDICAL RECORD NO.:  [id number]  LOCATION:  2H16C                        FACILITY:  [facility]  PHYSICIAN:  Eduardo Osier. Sharyn Lull, M.D. DATE OF BIRTH:  26-May-1966  DATE OF ADMISSION:  05/13/2016 DATE OF DISCHARGE:  05/18/2016                              DISCHARGE SUMMARY   ADMITTING DIAGNOSES: 1. Acute on chronic systolic congestive heart failure, [place] Heart     Association Class 4, paroxysmal atrial fibrillation. 2. Hypokalemia. 3. Acute kidney injury due to cardiorenal syndrome.  DISCHARGE DIAGNOSES: 1. Compensated systolic congestive heart failure.  Severe nonischemic     dilated cardiomyopathy, status post ICD in the past. 2. Hypertension. 3. Status post nonsustained VT.  Asymptomatic. 4. History of paroxysmal atrial fibrillation in the past. 5. History of cardiogenic non-Q-wave myocardial infarction in the     past. 6. History of cardiac emboli to the right arm, status post     thrombectomy in July 2014. 7. History of polysubstance and alcohol and tobacco abuse. 8. History of gouty arthritis. 9. Chronic kidney disease, stage 3.  DISCHARGE HOME MEDICATIONS: 1. BiDil 1 tablet 3 times daily. 2. Losartan 25 mg half tablet daily. 3. Potassium chloride 40 mEq daily. 4. Aldactone 25 mg daily. 5. Allopurinol 100 mg daily. 6. Digoxin 0.125 mg daily. 7. Nicotine patch 14 mg per 24 hours daily. 8. Amiodarone 200 mg daily. 9. Demadex 50 mg twice daily. 10.Coumadin 7.5 mg daily. 11.The patient has been advised to stop carvedilol and Entresto for     now.  DIET:  Low salt, low cholesterol diet.  Heart failure instructions have been given to the patient.  The patient has been advised to restrict fluid to 1 L per 24 hours and monitor weight daily.  FOLLOWUP:  Follow up with me in 1 week.  Follow up with Advanced Heart Failure Team in 2 weeks.  CONDITION AT DISCHARGE:  Stable.  The patient will refer for  cardiac transplant by Advanced Heart Failure Team as outpatient.  BRIEF HISTORY AND HOSPITAL COURSE:  Parker Johnson is a 50 year old male with past medical history significant for multiple medical problems, i.e., severe nonischemic dilated cardiomyopathy, status post ICD in the past, history of recurrent congestive heart failure secondary to depressed LV systolic function, hypertension, history of paroxysmal nonsustained VT and atrial fibrillation in the past, history of cardiogenic non-Q-wave myocardial infarction in the past, history of cardiac emboli to the right arm, status post thrombectomy in July 2014, history of alcohol abuse, tobacco abuse, and polysubstance abuse in the past.  History of gouty arthritis, was admitted on June 18 because of progressive increasing weight gain, associated with dyspnea on exertion, orthopnea and PND, and leg and abdominal swelling, and was noted to be in decompensated congestive heart failure.  The patient denies any chest pain, nausea, vomiting, diaphoresis.  Denies any fever, chills.  Denies noncompliance to medication except he has stopped his Entresto.  PHYSICAL EXAMINATION:  VITAL SIGNS:  His blood pressure was 118/85, pulse 100. NECK:  Supple.  Positive JVD. LUNGS:  He had bibasilar rales. CARDIOVASCULAR:  Regular rate and rhythm.  S1, S2 soft.  There was loud  S3 gallop 2/6 systolic murmur was noted. ABDOMEN:  Soft, distended, nontender. EXTREMITIES:  There is no clubbing, cyanosis.  There was 3+ edema. NEUROLOGIC:  Grossly intact.  LABORATORY DATA:  Sodium was 134, potassium was 2.8, BUN 34, creatinine 1.70.  His BNP was 3255.  Hemoglobin 14.5, hematocrit 42.9, white count of 6.1.  His PT was 34.1, INR 3.46.  Repeat BNP is trending down 2707, 2454.  Last BUN is 26, creatinine 1.40.  His PT is 23.3, INR 2.09. TSH level is 0.05.  BRIEF HOSPITAL COURSE:  The patient was admitted to step-down unit, was started on IV Lasix and IV Milrinone with  good diuresis.  The patient lost approximately 30 pounds during his hospital stay.  His Milrinone was weaned off yesterday.  The patient had episodes of nonsustained VT in the hospital asymptomatic.  Did not have any ICD shocks.  His INR has remained in therapeutic range.  The patient did have 2D echo, which showed severely depressed LV and RV systolic function.  EF approximately 10%.  The patient was seen in the hospital by Heart Failure Care Team. Also, his Sherryll Burger was discontinued and was started on BiDil which the dose of BiDil was gradually increased which he is tolerating it well. His renal function has remained stable.  The patient will be discharged home on above medications and will be followed up in my office in 1 week and Advanced Heart Failure Team in 2 weeks.  The patient has been given heart failure instructions.     Eduardo Osier. Sharyn Lull, M.D.     [facility]  D:  05/18/2016  T:  05/18/2016  Job:  [id number]

## 2016-05-18 NOTE — Progress Notes (Signed)
[id number] Pt did not want to walk as he stated going home today. Gave pt ex ed and discussed CRP 2. He would like referral as he will consider program. Will refer to [place]. Seems motivated to make changes. Luetta Nutting RN BSN 05/18/2016 9:52 AM

## 2016-06-01 ENCOUNTER — Inpatient Hospital Stay (HOSPITAL_COMMUNITY): Payer: Medicaid Other | Admitting: Internal Medicine

## 2016-06-21 ENCOUNTER — Encounter (HOSPITAL_COMMUNITY): Payer: Self-pay

## 2016-06-21 ENCOUNTER — Ambulatory Visit (HOSPITAL_COMMUNITY)
Admission: RE | Admit: 2016-06-21 | Discharge: 2016-06-21 | Disposition: A | Discharge status: Home / Self Care | Payer: Medicaid Other | Source: Ambulatory Visit | Attending: Internal Medicine | Admitting: Internal Medicine

## 2016-06-21 VITALS — BP 100/60 | HR 80 | Ht 67.0 in | Wt 183.8 lb

## 2016-06-21 DIAGNOSIS — Z8249 Family history of ischemic heart disease and other diseases of the circulatory system: Secondary | ICD-10-CM | POA: Diagnosis not present

## 2016-06-21 DIAGNOSIS — N183 Chronic kidney disease, stage 3 (moderate): Secondary | ICD-10-CM | POA: Insufficient documentation

## 2016-06-21 DIAGNOSIS — I428 Other cardiomyopathies: Secondary | ICD-10-CM | POA: Diagnosis not present

## 2016-06-21 DIAGNOSIS — Z86718 Personal history of other venous thrombosis and embolism: Secondary | ICD-10-CM | POA: Diagnosis not present

## 2016-06-21 DIAGNOSIS — M109 Gout, unspecified: Secondary | ICD-10-CM | POA: Insufficient documentation

## 2016-06-21 DIAGNOSIS — I480 Paroxysmal atrial fibrillation: Secondary | ICD-10-CM | POA: Diagnosis not present

## 2016-06-21 DIAGNOSIS — F17210 Nicotine dependence, cigarettes, uncomplicated: Secondary | ICD-10-CM | POA: Diagnosis not present

## 2016-06-21 DIAGNOSIS — I130 Hypertensive heart and chronic kidney disease with heart failure and stage 1 through stage 4 chronic kidney disease, or unspecified chronic kidney disease: Secondary | ICD-10-CM | POA: Diagnosis not present

## 2016-06-21 DIAGNOSIS — Z7901 Long term (current) use of anticoagulants: Secondary | ICD-10-CM | POA: Diagnosis not present

## 2016-06-21 DIAGNOSIS — Z95810 Presence of automatic (implantable) cardiac defibrillator: Secondary | ICD-10-CM | POA: Diagnosis not present

## 2016-06-21 DIAGNOSIS — Z79899 Other long term (current) drug therapy: Secondary | ICD-10-CM | POA: Diagnosis not present

## 2016-06-21 DIAGNOSIS — I5022 Chronic systolic (congestive) heart failure: Secondary | ICD-10-CM | POA: Insufficient documentation

## 2016-06-21 DIAGNOSIS — I509 Heart failure, unspecified: Secondary | ICD-10-CM | POA: Diagnosis present

## 2016-06-21 DIAGNOSIS — F17200 Nicotine dependence, unspecified, uncomplicated: Secondary | ICD-10-CM

## 2016-06-21 DIAGNOSIS — Z833 Family history of diabetes mellitus: Secondary | ICD-10-CM | POA: Insufficient documentation

## 2016-06-21 LAB — BASIC METABOLIC PANEL
Anion gap: 9 (ref 5–15)
BUN: 16 mg/dL (ref 6–20)
CHLORIDE: 97 mmol/L — AB (ref 101–111)
CO2: 31 mmol/L (ref 22–32)
Calcium: 9.2 mg/dL (ref 8.9–10.3)
Creatinine, Ser: 1.12 mg/dL (ref 0.61–1.24)
GFR calc Af Amer: >60 mL/min (ref >60)
GFR calc non Af Amer: >60 mL/min (ref >60)
Glucose, Bld: 90 mg/dL (ref 65–99)
POTASSIUM: 3.6 mmol/L (ref 3.5–5.1)
SODIUM: 137 mmol/L (ref 135–145)

## 2016-06-21 LAB — BRAIN NATRIURETIC PEPTIDE: B NATRIURETIC PEPTIDE 5: 2504.3 pg/mL — AB (ref 0.0–100.0)

## 2016-06-21 NOTE — Patient Instructions (Signed)
Labs today  Your physician recommends that you schedule a follow-up appointment in: 2 weeks with the CHF pharmacist Your physician recommends that you schedule a follow-up appointment in: 6 weeks with Dr Gala Romney

## 2016-06-21 NOTE — Progress Notes (Signed)
Patient ID: AMEYA SEARCH, male   DOB: 1966-06-24, 50 y.o.   MRN: [id number]    Advanced Heart Failure Clinic Note   Primary Care:  Primary Cardiologist: Dr. Sharyn Lull EP: Dr. Graciela Husbands HF: Dr. Gala Romney   HPI:  Parker Johnson is a 50 y.o. male with a history of chronic systolic CHF due to NICM, BSci ICD (part of the MADIT- RiT trial), PAF, CKD III, history of tobacco, alcohol, and substance abuse.    Previously followed by Dr Sharyn Lull.   Has had HF since before 2010. Stopped drinking earlier this year.    Admitted to [facility] 6/18 - 05/18/16 with cardiogenic shock and a/c systolic HF. PICC line placed, initial coox 51% and CVP elevated to 29.  Was on milrinone up to 0.375 mcg/kg/min but able to weaned off prior to discharge.  Diuresed on IV lasix up to 100 mg BID with 5 mg metolazone.  Overall he diuresed > 30 lbs.  Discharge weight 177 lbs.  He presents today for post hospital follow up (missed original scheduled appointment). Weight up 5 lbs from discharge.  He is now taking torsemide 100 mg BID, 25 mg daily, and Bidil only BID. He states he has discussed the torsemide change with Dr. Sharyn Lull.  Has been feeling great overall.  Watching fluid and salt.  Continues to smoke 2-3 cigarettes a day. Remains off alcohol and drugs.  No DOE on flat ground.  No problem with hills or steps either.  Denies orthopnea, PND, or bendopnea. Does yard work ( mostly weed eating). Did 5 yards in one day in the past several weeks without SOB.  Denies CP. No lightheadedness or dizziness. Starts cardiac rehab in a couple of weeks.   Echo 05/14/16 LVEF 10%, Mod MR, Mod LAE, Mild RAE, Severe TR  Past Medical History:  Diagnosis Date  . AICD (automatic cardioverter/defibrillator) present    [employer] 100  . Alcohol abuse    hx  . Cardiomyopathy    secondary  . CHF (congestive heart failure) (HCC)   . Chronic anticoagulation    Coumadin for hx LV thrombus and PAF  . Gout   . HTN (hypertension)    uncontrolled  . Tobacco abuse   . Ventricular tachycardia [employer])     Current Outpatient Prescriptions  Medication Sig Dispense Refill  . allopurinol (ZYLOPRIM) 100 MG tablet Take 100 mg by mouth daily.    Marland Kitchen amiodarone (PACERONE) 200 MG tablet Take 1 tablet (200 mg total) by mouth daily. 30 tablet 3  . digoxin (LANOXIN) 0.25 MG tablet Take 1/2 tablet (125 mcg) once daily    . isosorbide-hydrALAZINE (BIDIL) 20-37.5 MG tablet Take 1 tablet by mouth 2 (two) times daily.    Marland Kitchen losartan (COZAAR) 25 MG tablet Take 25 mg by mouth daily.    . nicotine (NICODERM CQ - DOSED IN MG/24 HOURS) 14 mg/24hr patch Place 1 patch (14 mg total) onto the skin daily. 28 patch 0  . potassium chloride SA (K-DUR,KLOR-CON) 20 MEQ tablet Take 2 tablets (40 mEq total) by mouth daily. 30 tablet 3  . spironolactone (ALDACTONE) 25 MG tablet Take 1 tablet (25 mg total) by mouth daily. 30 tablet 3  . torsemide (DEMADEX) 100 MG tablet Take 100 mg by mouth 2 (two) times daily.    Marland Kitchen warfarin (COUMADIN) 7.5 MG tablet Take 1 tablet (7.5 mg total) by mouth daily at 6 PM. 35 tablet 3   No current facility-administered medications for this encounter.  No Known Allergies    Social History   Social History  . Marital status: Married    Spouse name: N/A  . Number of children: N/A  . Years of education: N/A   Occupational History  . Disabled    Social History Main Topics  . Smoking status: Current Some Day Smoker    Packs/day: 0.50    Years: 30.00    Types: Cigarettes  . Smokeless tobacco: Never Used     Comment: I already have information on quitting  . Alcohol use 0.6 oz/week    1 Cans of beer per week     Comment: None in 3 months  . Drug use: No     Comment: history of cocaine use that is not very recent (1-2 yrs)  . Sexual activity: Yes    Birth control/ protection: None   Other Topics Concern  . Not on file   Social History Narrative   Disabled. Did not finish the 12th grade but is interested in  pursuing a GED.       Family History  Problem Relation Age of Onset  . Cardiomyopathy Father     on a defib  . Diabetes Father     Vitals:   06/21/16 1122  BP: 100/60  Pulse: 80  SpO2: 97%  Weight: 183 lb 12.8 oz (83.4 kg)  Height:  (1.702 m)    PHYSICAL EXAM: General:  Well appearing. No resp difficulty. In bed.  HEENT: normal Neck: supple. JVP ~7-8 . Carotids 2+ bilat; no bruits. No thyromegaly or nodule noted.  Cor: PMI laterally displaced. RRR. No murmur +s3 Lungs: CTAB. Normal effort Abdomen: soft, NT, ND, no HSM. No bruits or masses. +BS  Extremities: no cyanosis, clubbing, rash, R and LLE 1+ edema.  Neuro: alert & orientedx3, cranial nerves grossly intact. moves all 4 extremities w/o difficulty. Affect pleasant  ECG: NSR 83 bpm with PVC  ASSESSMENT & PLAN: 1. Chronic systolic HF due to NICM    -LVEF 10% RV severely HK 2. CKD stage 3 3. PAF - now in NSR 4. H/o LV thrombus 5. H/o ETOH and substance abuse - now quit 6. Tobacco use   Has some volume overload with 1+ ankle edema.  Has missed several doses of torsemide and ran out yesterday.  Encouraged to wear compression stockings as well.   Reinforced importance of daily weights, limiting fluid and salt intake, and to call HF clinic if weight gain of 3 lbs overnight or 5 lbs within one week.   Continue torsemide 100 mg BID. Continue Losartan 25 mg daily. Continue Bidil 2 tabs BID for now.  With borderline pressure, will have see Pharm-D in 3 weeks (with his cardiac rehab appointment) and attempt to uptitrate.  Encouraged to quit smoking.   Start cardiac rehab.   Will plan on repeat echo 10/2016. Follow up 3 weeks with pharmacy and 6 weeks with MD.   [place]" [place], PA-C 06/21/2016 1:28 PM   Total time spent  25 minutes, over half that spent discussing the above.

## 2016-07-05 ENCOUNTER — Ambulatory Visit (HOSPITAL_COMMUNITY): Payer: Medicaid Other

## 2016-07-09 ENCOUNTER — Ambulatory Visit (HOSPITAL_COMMUNITY): Payer: Medicaid Other

## 2016-07-11 ENCOUNTER — Inpatient Hospital Stay (HOSPITAL_COMMUNITY): Admission: RE | Admit: 2016-07-11 | Payer: Medicaid Other | Source: Ambulatory Visit

## 2016-07-11 ENCOUNTER — Ambulatory Visit (HOSPITAL_COMMUNITY): Payer: Medicaid Other

## 2016-07-13 ENCOUNTER — Ambulatory Visit (HOSPITAL_COMMUNITY): Payer: Medicaid Other

## 2016-07-16 ENCOUNTER — Ambulatory Visit (HOSPITAL_COMMUNITY): Payer: Medicaid Other

## 2016-07-18 ENCOUNTER — Ambulatory Visit (HOSPITAL_COMMUNITY): Payer: Medicaid Other

## 2016-07-20 ENCOUNTER — Ambulatory Visit (HOSPITAL_COMMUNITY): Payer: Medicaid Other

## 2016-07-23 ENCOUNTER — Ambulatory Visit (HOSPITAL_COMMUNITY): Payer: Medicaid Other

## 2016-07-25 ENCOUNTER — Encounter (HOSPITAL_COMMUNITY): Payer: Self-pay | Admitting: *Deleted

## 2016-07-25 ENCOUNTER — Ambulatory Visit (HOSPITAL_COMMUNITY): Payer: Medicaid Other

## 2016-07-25 DIAGNOSIS — I5022 Chronic systolic (congestive) heart failure: Secondary | ICD-10-CM

## 2016-07-26 ENCOUNTER — Inpatient Hospital Stay (HOSPITAL_COMMUNITY): Admission: RE | Admit: 2016-07-26 | Payer: Medicaid Other | Source: Ambulatory Visit

## 2016-07-27 ENCOUNTER — Ambulatory Visit (HOSPITAL_COMMUNITY): Payer: Medicaid Other

## 2016-08-01 ENCOUNTER — Ambulatory Visit (HOSPITAL_COMMUNITY): Payer: Medicaid Other

## 2016-08-03 ENCOUNTER — Ambulatory Visit (HOSPITAL_COMMUNITY): Payer: Medicaid Other

## 2016-08-06 ENCOUNTER — Ambulatory Visit (HOSPITAL_COMMUNITY): Payer: Medicaid Other

## 2016-08-08 ENCOUNTER — Ambulatory Visit (HOSPITAL_COMMUNITY): Payer: Medicaid Other

## 2016-08-10 ENCOUNTER — Ambulatory Visit (HOSPITAL_COMMUNITY): Payer: Medicaid Other

## 2016-08-13 ENCOUNTER — Ambulatory Visit (HOSPITAL_COMMUNITY): Payer: Medicaid Other

## 2016-08-15 ENCOUNTER — Ambulatory Visit (HOSPITAL_COMMUNITY): Payer: Medicaid Other

## 2016-08-17 ENCOUNTER — Ambulatory Visit (HOSPITAL_COMMUNITY): Payer: Medicaid Other

## 2016-08-20 ENCOUNTER — Ambulatory Visit (HOSPITAL_COMMUNITY): Payer: Medicaid Other

## 2016-08-22 ENCOUNTER — Ambulatory Visit (HOSPITAL_COMMUNITY): Payer: Medicaid Other

## 2016-08-24 ENCOUNTER — Ambulatory Visit (HOSPITAL_COMMUNITY): Payer: Medicaid Other

## 2016-08-27 ENCOUNTER — Ambulatory Visit (HOSPITAL_COMMUNITY): Payer: Medicaid Other

## 2016-08-29 ENCOUNTER — Ambulatory Visit (HOSPITAL_COMMUNITY): Payer: Medicaid Other

## 2016-08-31 ENCOUNTER — Ambulatory Visit (HOSPITAL_COMMUNITY): Payer: Medicaid Other

## 2016-09-03 ENCOUNTER — Ambulatory Visit (HOSPITAL_COMMUNITY): Payer: Medicaid Other

## 2016-09-05 ENCOUNTER — Ambulatory Visit (HOSPITAL_COMMUNITY): Payer: Medicaid Other

## 2016-09-07 ENCOUNTER — Ambulatory Visit (HOSPITAL_COMMUNITY): Payer: Medicaid Other

## 2016-09-10 ENCOUNTER — Ambulatory Visit (HOSPITAL_COMMUNITY): Payer: Medicaid Other

## 2016-09-12 ENCOUNTER — Ambulatory Visit (HOSPITAL_COMMUNITY): Payer: Medicaid Other

## 2016-09-14 ENCOUNTER — Ambulatory Visit (HOSPITAL_COMMUNITY): Payer: Medicaid Other

## 2016-09-17 ENCOUNTER — Ambulatory Visit (HOSPITAL_COMMUNITY): Payer: Medicaid Other

## 2016-09-19 ENCOUNTER — Ambulatory Visit (HOSPITAL_COMMUNITY): Payer: Medicaid Other

## 2016-09-21 ENCOUNTER — Ambulatory Visit (HOSPITAL_COMMUNITY): Payer: Medicaid Other

## 2016-09-24 ENCOUNTER — Ambulatory Visit (HOSPITAL_COMMUNITY): Payer: Medicaid Other

## 2016-09-26 ENCOUNTER — Ambulatory Visit (HOSPITAL_COMMUNITY): Payer: Medicaid Other

## 2016-09-28 ENCOUNTER — Ambulatory Visit (HOSPITAL_COMMUNITY): Payer: Medicaid Other

## 2016-10-01 ENCOUNTER — Ambulatory Visit (HOSPITAL_COMMUNITY): Payer: Medicaid Other

## 2016-10-03 ENCOUNTER — Ambulatory Visit (HOSPITAL_COMMUNITY): Payer: Medicaid Other

## 2016-10-05 ENCOUNTER — Ambulatory Visit (HOSPITAL_COMMUNITY): Payer: Medicaid Other

## 2016-10-08 ENCOUNTER — Ambulatory Visit (HOSPITAL_COMMUNITY): Payer: Medicaid Other

## 2016-10-10 ENCOUNTER — Emergency Department (HOSPITAL_COMMUNITY): Payer: Medicaid Other

## 2016-10-10 ENCOUNTER — Encounter (HOSPITAL_COMMUNITY): Payer: Self-pay | Admitting: Emergency Medicine

## 2016-10-10 ENCOUNTER — Inpatient Hospital Stay (HOSPITAL_COMMUNITY)
Admission: EM | Admit: 2016-10-10 | Discharge: 2016-11-26 | DRG: 270 | Disposition: E | Payer: Medicaid Other | Attending: Internal Medicine | Admitting: Internal Medicine

## 2016-10-10 ENCOUNTER — Ambulatory Visit (HOSPITAL_COMMUNITY): Payer: Medicaid Other

## 2016-10-10 ENCOUNTER — Inpatient Hospital Stay (HOSPITAL_COMMUNITY): Payer: Medicaid Other

## 2016-10-10 DIAGNOSIS — I5022 Chronic systolic (congestive) heart failure: Secondary | ICD-10-CM | POA: Diagnosis not present

## 2016-10-10 DIAGNOSIS — R509 Fever, unspecified: Secondary | ICD-10-CM | POA: Diagnosis not present

## 2016-10-10 DIAGNOSIS — Z7901 Long term (current) use of anticoagulants: Secondary | ICD-10-CM | POA: Diagnosis not present

## 2016-10-10 DIAGNOSIS — I480 Paroxysmal atrial fibrillation: Secondary | ICD-10-CM | POA: Diagnosis present

## 2016-10-10 DIAGNOSIS — I442 Atrioventricular block, complete: Secondary | ICD-10-CM | POA: Diagnosis present

## 2016-10-10 DIAGNOSIS — T827XXA Infection and inflammatory reaction due to other cardiac and vascular devices, implants and grafts, initial encounter: Secondary | ICD-10-CM

## 2016-10-10 DIAGNOSIS — N186 End stage renal disease: Secondary | ICD-10-CM | POA: Diagnosis not present

## 2016-10-10 DIAGNOSIS — Z515 Encounter for palliative care: Secondary | ICD-10-CM | POA: Diagnosis present

## 2016-10-10 DIAGNOSIS — Z9889 Other specified postprocedural states: Secondary | ICD-10-CM | POA: Diagnosis not present

## 2016-10-10 DIAGNOSIS — N179 Acute kidney failure, unspecified: Secondary | ICD-10-CM | POA: Diagnosis not present

## 2016-10-10 DIAGNOSIS — I5023 Acute on chronic systolic (congestive) heart failure: Secondary | ICD-10-CM

## 2016-10-10 DIAGNOSIS — I130 Hypertensive heart and chronic kidney disease with heart failure and stage 1 through stage 4 chronic kidney disease, or unspecified chronic kidney disease: Secondary | ICD-10-CM | POA: Diagnosis present

## 2016-10-10 DIAGNOSIS — R0602 Shortness of breath: Secondary | ICD-10-CM | POA: Diagnosis not present

## 2016-10-10 DIAGNOSIS — N170 Acute kidney failure with tubular necrosis: Secondary | ICD-10-CM | POA: Diagnosis present

## 2016-10-10 DIAGNOSIS — R34 Anuria and oliguria: Secondary | ICD-10-CM | POA: Diagnosis present

## 2016-10-10 DIAGNOSIS — Z9049 Acquired absence of other specified parts of digestive tract: Secondary | ICD-10-CM | POA: Diagnosis not present

## 2016-10-10 DIAGNOSIS — A4901 Methicillin susceptible Staphylococcus aureus infection, unspecified site: Secondary | ICD-10-CM | POA: Diagnosis not present

## 2016-10-10 DIAGNOSIS — M109 Gout, unspecified: Secondary | ICD-10-CM | POA: Diagnosis present

## 2016-10-10 DIAGNOSIS — E876 Hypokalemia: Secondary | ICD-10-CM | POA: Diagnosis not present

## 2016-10-10 DIAGNOSIS — I4901 Ventricular fibrillation: Secondary | ICD-10-CM

## 2016-10-10 DIAGNOSIS — A4101 Sepsis due to Methicillin susceptible Staphylococcus aureus: Secondary | ICD-10-CM | POA: Diagnosis present

## 2016-10-10 DIAGNOSIS — I509 Heart failure, unspecified: Secondary | ICD-10-CM

## 2016-10-10 DIAGNOSIS — Z79899 Other long term (current) drug therapy: Secondary | ICD-10-CM

## 2016-10-10 DIAGNOSIS — Z992 Dependence on renal dialysis: Secondary | ICD-10-CM

## 2016-10-10 DIAGNOSIS — J852 Abscess of lung without pneumonia: Secondary | ICD-10-CM | POA: Diagnosis not present

## 2016-10-10 DIAGNOSIS — N183 Chronic kidney disease, stage 3 (moderate): Secondary | ICD-10-CM | POA: Diagnosis present

## 2016-10-10 DIAGNOSIS — A419 Sepsis, unspecified organism: Secondary | ICD-10-CM | POA: Diagnosis present

## 2016-10-10 DIAGNOSIS — E872 Acidosis: Secondary | ICD-10-CM | POA: Diagnosis present

## 2016-10-10 DIAGNOSIS — Z95810 Presence of automatic (implantable) cardiac defibrillator: Secondary | ICD-10-CM | POA: Diagnosis not present

## 2016-10-10 DIAGNOSIS — R791 Abnormal coagulation profile: Secondary | ICD-10-CM | POA: Diagnosis present

## 2016-10-10 DIAGNOSIS — T80219A Unspecified infection due to central venous catheter, initial encounter: Secondary | ICD-10-CM | POA: Diagnosis present

## 2016-10-10 DIAGNOSIS — Z7189 Other specified counseling: Secondary | ICD-10-CM | POA: Diagnosis not present

## 2016-10-10 DIAGNOSIS — Z6829 Body mass index (BMI) 29.0-29.9, adult: Secondary | ICD-10-CM

## 2016-10-10 DIAGNOSIS — J918 Pleural effusion in other conditions classified elsewhere: Secondary | ICD-10-CM | POA: Diagnosis not present

## 2016-10-10 DIAGNOSIS — E871 Hypo-osmolality and hyponatremia: Secondary | ICD-10-CM | POA: Diagnosis present

## 2016-10-10 DIAGNOSIS — R55 Syncope and collapse: Secondary | ICD-10-CM

## 2016-10-10 DIAGNOSIS — R5081 Fever presenting with conditions classified elsewhere: Secondary | ICD-10-CM | POA: Diagnosis not present

## 2016-10-10 DIAGNOSIS — I472 Ventricular tachycardia: Secondary | ICD-10-CM | POA: Diagnosis present

## 2016-10-10 DIAGNOSIS — Z9114 Patient's other noncompliance with medication regimen: Secondary | ICD-10-CM

## 2016-10-10 DIAGNOSIS — F17210 Nicotine dependence, cigarettes, uncomplicated: Secondary | ICD-10-CM | POA: Diagnosis present

## 2016-10-10 DIAGNOSIS — R7881 Bacteremia: Secondary | ICD-10-CM

## 2016-10-10 DIAGNOSIS — K761 Chronic passive congestion of liver: Secondary | ICD-10-CM | POA: Diagnosis present

## 2016-10-10 DIAGNOSIS — Y848 Other medical procedures as the cause of abnormal reaction of the patient, or of later complication, without mention of misadventure at the time of the procedure: Secondary | ICD-10-CM | POA: Diagnosis present

## 2016-10-10 DIAGNOSIS — Z66 Do not resuscitate: Secondary | ICD-10-CM | POA: Diagnosis present

## 2016-10-10 DIAGNOSIS — R6521 Severe sepsis with septic shock: Secondary | ICD-10-CM | POA: Diagnosis present

## 2016-10-10 DIAGNOSIS — I471 Supraventricular tachycardia: Secondary | ICD-10-CM | POA: Diagnosis present

## 2016-10-10 DIAGNOSIS — R570 Cardiogenic shock: Secondary | ICD-10-CM | POA: Diagnosis not present

## 2016-10-10 DIAGNOSIS — R451 Restlessness and agitation: Secondary | ICD-10-CM | POA: Diagnosis present

## 2016-10-10 DIAGNOSIS — T827XXD Infection and inflammatory reaction due to other cardiac and vascular devices, implants and grafts, subsequent encounter: Secondary | ICD-10-CM | POA: Diagnosis not present

## 2016-10-10 DIAGNOSIS — K921 Melena: Secondary | ICD-10-CM | POA: Diagnosis present

## 2016-10-10 DIAGNOSIS — Z833 Family history of diabetes mellitus: Secondary | ICD-10-CM

## 2016-10-10 DIAGNOSIS — B9561 Methicillin susceptible Staphylococcus aureus infection as the cause of diseases classified elsewhere: Secondary | ICD-10-CM

## 2016-10-10 DIAGNOSIS — J984 Other disorders of lung: Secondary | ICD-10-CM | POA: Diagnosis not present

## 2016-10-10 DIAGNOSIS — D689 Coagulation defect, unspecified: Secondary | ICD-10-CM | POA: Diagnosis present

## 2016-10-10 DIAGNOSIS — I5082 Biventricular heart failure: Secondary | ICD-10-CM | POA: Diagnosis present

## 2016-10-10 DIAGNOSIS — D649 Anemia, unspecified: Secondary | ICD-10-CM | POA: Diagnosis present

## 2016-10-10 DIAGNOSIS — I428 Other cardiomyopathies: Secondary | ICD-10-CM

## 2016-10-10 LAB — CBC WITH DIFFERENTIAL/PLATELET
BASOS ABS: 0.1 10*3/uL (ref 0.0–0.1)
Basophils Relative: 1 %
Eosinophils Absolute: 2 10*3/uL — ABNORMAL HIGH (ref 0.0–0.7)
Eosinophils Relative: 30 %
HEMATOCRIT: 43.9 % (ref 39.0–52.0)
HEMOGLOBIN: 14.7 g/dL (ref 13.0–17.0)
LYMPHS PCT: 17 %
Lymphs Abs: 1.1 10*3/uL (ref 0.7–4.0)
MCH: 29.4 pg (ref 26.0–34.0)
MCHC: 33.5 g/dL (ref 30.0–36.0)
MCV: 87.8 fL (ref 78.0–100.0)
MONOS PCT: 5 %
Monocytes Absolute: 0.3 10*3/uL (ref 0.1–1.0)
NEUTROS ABS: 3.1 10*3/uL (ref 1.7–7.7)
Neutrophils Relative %: 47 %
Platelets: 169 10*3/uL (ref 150–400)
RBC: 5 MIL/uL (ref 4.22–5.81)
RDW: 21.1 % — ABNORMAL HIGH (ref 11.5–15.5)
WBC: 6.6 10*3/uL (ref 4.0–10.5)

## 2016-10-10 LAB — COOXEMETRY PANEL
CARBOXYHEMOGLOBIN: 0.9 % (ref 0.5–1.5)
Carboxyhemoglobin: 1.1 % (ref 0.5–1.5)
METHEMOGLOBIN: 0.9 % (ref 0.0–1.5)
Methemoglobin: 0.9 % (ref 0.0–1.5)
O2 SAT: 46.4 %
O2 Saturation: 25.8 %
Total hemoglobin: 14.3 g/dL (ref 12.0–16.0)
Total hemoglobin: 14.8 g/dL (ref 12.0–16.0)

## 2016-10-10 LAB — BASIC METABOLIC PANEL
ANION GAP: 12 (ref 5–15)
BUN: 22 mg/dL — AB (ref 6–20)
CO2: 31 mmol/L (ref 22–32)
Calcium: 8.6 mg/dL — ABNORMAL LOW (ref 8.9–10.3)
Chloride: 94 mmol/L — ABNORMAL LOW (ref 101–111)
Creatinine, Ser: 1.47 mg/dL — ABNORMAL HIGH (ref 0.61–1.24)
GFR calc Af Amer: >60 mL/min (ref >60)
GFR calc non Af Amer: 54 mL/min — ABNORMAL LOW (ref >60)
GLUCOSE: 133 mg/dL — AB (ref 65–99)
POTASSIUM: 3.5 mmol/L (ref 3.5–5.1)
Sodium: 137 mmol/L (ref 135–145)

## 2016-10-10 LAB — COMPREHENSIVE METABOLIC PANEL
ALBUMIN: 3.3 g/dL — AB (ref 3.5–5.0)
ALK PHOS: 88 U/L (ref 38–126)
ALT: 16 U/L — AB (ref 17–63)
AST: 31 U/L (ref 15–41)
Anion gap: 13 (ref 5–15)
BUN: 18 mg/dL (ref 6–20)
CALCIUM: 8.6 mg/dL — AB (ref 8.9–10.3)
CO2: 31 mmol/L (ref 22–32)
CREATININE: 1.28 mg/dL — AB (ref 0.61–1.24)
Chloride: 94 mmol/L — ABNORMAL LOW (ref 101–111)
GFR calc Af Amer: >60 mL/min (ref >60)
GFR calc non Af Amer: >60 mL/min (ref >60)
GLUCOSE: 151 mg/dL — AB (ref 65–99)
Potassium: 2.8 mmol/L — ABNORMAL LOW (ref 3.5–5.1)
SODIUM: 138 mmol/L (ref 135–145)
Total Bilirubin: 2.8 mg/dL — ABNORMAL HIGH (ref 0.3–1.2)
Total Protein: 7.5 g/dL (ref 6.5–8.1)

## 2016-10-10 LAB — PROTIME-INR
INR: 2.82
Prothrombin Time: 30.2 seconds — ABNORMAL HIGH (ref 11.4–15.2)

## 2016-10-10 LAB — I-STAT TROPONIN, ED: Troponin i, poc: 0.05 ng/mL (ref 0.00–0.08)

## 2016-10-10 LAB — LACTIC ACID, PLASMA: LACTIC ACID, VENOUS: 2.8 mmol/L — AB (ref 0.5–1.9)

## 2016-10-10 LAB — DIGOXIN LEVEL: DIGOXIN LVL: 1 ng/mL (ref 0.8–2.0)

## 2016-10-10 LAB — MRSA PCR SCREENING: MRSA by PCR: NEGATIVE

## 2016-10-10 LAB — BRAIN NATRIURETIC PEPTIDE: B Natriuretic Peptide: 3542.3 pg/mL — ABNORMAL HIGH (ref 0.0–100.0)

## 2016-10-10 LAB — MAGNESIUM: MAGNESIUM: 1.8 mg/dL (ref 1.7–2.4)

## 2016-10-10 MED ORDER — POTASSIUM CHLORIDE CRYS ER 20 MEQ PO TBCR
40.0000 meq | EXTENDED_RELEASE_TABLET | Freq: Once | ORAL | Status: AC
  Administered 2016-10-10: 40 meq via ORAL
  Filled 2016-10-10: qty 2

## 2016-10-10 MED ORDER — ACETAMINOPHEN 325 MG PO TABS
650.0000 mg | ORAL_TABLET | ORAL | Status: DC | PRN
  Administered 2016-10-13 – 2016-10-21 (×6): 650 mg via ORAL
  Filled 2016-10-10 (×6): qty 2

## 2016-10-10 MED ORDER — AMIODARONE HCL IN DEXTROSE 360-4.14 MG/200ML-% IV SOLN
30.0000 mg/h | INTRAVENOUS | Status: DC
  Filled 2016-10-10: qty 200

## 2016-10-10 MED ORDER — METOLAZONE 2.5 MG PO TABS
2.5000 mg | ORAL_TABLET | Freq: Once | ORAL | Status: AC
  Administered 2016-10-10: 2.5 mg via ORAL
  Filled 2016-10-10: qty 1

## 2016-10-10 MED ORDER — SODIUM CHLORIDE 0.9% FLUSH: 10.0000 mL | INTRAVENOUS | Status: DC | PRN

## 2016-10-10 MED ORDER — DEXTROSE 5 % IV SOLN
0.0000 ug/min | INTRAVENOUS | Status: DC
  Administered 2016-10-10 – 2016-10-12 (×3): 5 ug/min via INTRAVENOUS
  Filled 2016-10-10 (×4): qty 4

## 2016-10-10 MED ORDER — SPIRONOLACTONE 25 MG PO TABS
12.5000 mg | ORAL_TABLET | Freq: Every day | ORAL | Status: DC
  Administered 2016-10-10 – 2016-10-11 (×2): 12.5 mg via ORAL
  Filled 2016-10-10 (×2): qty 1

## 2016-10-10 MED ORDER — MILRINONE LACTATE IN DEXTROSE 20-5 MG/100ML-% IV SOLN
0.3750 ug/kg/min | INTRAVENOUS | Status: DC
  Administered 2016-10-10 – 2016-10-12 (×4): 0.375 ug/kg/min via INTRAVENOUS
  Filled 2016-10-10 (×4): qty 100

## 2016-10-10 MED ORDER — DIGOXIN 125 MCG PO TABS
0.1250 mg | ORAL_TABLET | Freq: Every day | ORAL | Status: DC
Start: 2016-10-10 — End: 2016-10-11
  Administered 2016-10-10 – 2016-10-11 (×2): 0.125 mg via ORAL
  Filled 2016-10-10 (×2): qty 1

## 2016-10-10 MED ORDER — WARFARIN - PHARMACIST DOSING INPATIENT
Freq: Every day | Status: DC
Start: 2016-10-10 — End: 2016-10-11
  Administered 2016-10-10: 18:00:00

## 2016-10-10 MED ORDER — SODIUM CHLORIDE 0.9 % IV SOLN: 250.0000 mL | INTRAVENOUS | Status: DC | PRN

## 2016-10-10 MED ORDER — ONDANSETRON HCL 4 MG/2ML IJ SOLN
4.0000 mg | Freq: Four times a day (QID) | INTRAMUSCULAR | Status: DC | PRN
  Administered 2016-10-11: 4 mg via INTRAVENOUS
  Filled 2016-10-10: qty 2

## 2016-10-10 MED ORDER — POTASSIUM CHLORIDE CRYS ER 20 MEQ PO TBCR
40.0000 meq | EXTENDED_RELEASE_TABLET | Freq: Two times a day (BID) | ORAL | Status: DC
  Administered 2016-10-10 – 2016-10-11 (×3): 40 meq via ORAL
  Filled 2016-10-10 (×3): qty 2

## 2016-10-10 MED ORDER — SODIUM CHLORIDE 0.9% FLUSH
3.0000 mL | Freq: Two times a day (BID) | INTRAVENOUS | Status: DC
  Administered 2016-10-11 – 2016-10-12 (×3): 3 mL via INTRAVENOUS

## 2016-10-10 MED ORDER — SODIUM CHLORIDE 0.9 % IV SOLN
Freq: Once | INTRAVENOUS | Status: AC
  Administered 2016-10-10: 12:00:00 via INTRAVENOUS
  Filled 2016-10-10: qty 1000

## 2016-10-10 MED ORDER — MILRINONE LACTATE IN DEXTROSE 20-5 MG/100ML-% IV SOLN: 0.3750 ug/kg/min | INTRAVENOUS | Status: DC

## 2016-10-10 MED ORDER — SODIUM CHLORIDE 0.9% FLUSH: 3.0000 mL | INTRAVENOUS | Status: DC | PRN

## 2016-10-10 MED ORDER — SODIUM CHLORIDE 0.9% FLUSH
10.0000 mL | Freq: Two times a day (BID) | INTRAVENOUS | Status: DC
  Administered 2016-10-10 – 2016-10-26 (×26): 10 mL
  Administered 2016-10-27: 20 mL
  Administered 2016-10-28 – 2016-11-02 (×6): 10 mL

## 2016-10-10 MED ORDER — FUROSEMIDE 10 MG/ML IJ SOLN
80.0000 mg | Freq: Once | INTRAMUSCULAR | Status: AC
  Administered 2016-10-10: 80 mg via INTRAVENOUS
  Filled 2016-10-10: qty 8

## 2016-10-10 MED ORDER — FUROSEMIDE 10 MG/ML IJ SOLN
80.0000 mg | Freq: Two times a day (BID) | INTRAMUSCULAR | Status: DC
  Administered 2016-10-10: 80 mg via INTRAVENOUS
  Filled 2016-10-10: qty 8

## 2016-10-10 MED ORDER — AMIODARONE LOAD VIA INFUSION
150.0000 mg | Freq: Once | INTRAVENOUS | Status: AC
  Administered 2016-10-10: 150 mg via INTRAVENOUS
  Filled 2016-10-10: qty 83.34

## 2016-10-10 MED ORDER — AMIODARONE HCL IN DEXTROSE 360-4.14 MG/200ML-% IV SOLN
60.0000 mg/h | INTRAVENOUS | Status: AC
  Administered 2016-10-10 – 2016-10-11 (×4): 60 mg/h via INTRAVENOUS
  Filled 2016-10-10 (×3): qty 200

## 2016-10-10 MED ORDER — TRAMADOL HCL 50 MG PO TABS
50.0000 mg | ORAL_TABLET | Freq: Four times a day (QID) | ORAL | Status: DC | PRN
  Administered 2016-10-10 – 2016-10-27 (×6): 50 mg via ORAL
  Filled 2016-10-10 (×6): qty 1

## 2016-10-10 MED ORDER — FUROSEMIDE 10 MG/ML IJ SOLN
40.0000 mg | Freq: Once | INTRAMUSCULAR | Status: AC
  Administered 2016-10-10: 40 mg via INTRAVENOUS
  Filled 2016-10-10: qty 4

## 2016-10-10 MED ORDER — WARFARIN SODIUM 7.5 MG PO TABS
7.5000 mg | ORAL_TABLET | Freq: Every day | ORAL | Status: DC
  Administered 2016-10-10: 7.5 mg via ORAL
  Filled 2016-10-10 (×2): qty 1

## 2016-10-10 MED ORDER — MAGNESIUM OXIDE 400 (241.3 MG) MG PO TABS
400.0000 mg | ORAL_TABLET | Freq: Two times a day (BID) | ORAL | Status: DC
  Administered 2016-10-10 – 2016-10-11 (×3): 400 mg via ORAL
  Filled 2016-10-10 (×3): qty 1

## 2016-10-10 NOTE — ED Triage Notes (Signed)
EKG given to [facility] MD at this time.
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
## 2016-10-10 NOTE — Consult Note (Signed)
Advanced Heart Failure Team Consult Note  Referring Physician: Dr Graciela Husbands Primary Cardiologist: Dr Sharyn Lull HF Cardiologist:  Dr Gala Romney  Reason for Consultation: Acute Heart Failure  HPI:    Parker Johnson is a 50 y.o. male with a past medical history significant for NICM, chronic systolic heart failure EF 10-15%, [employer] ICD, paroxysmal atrial fibrillation, and CKD admitted with syncope and ICD shocks.    Admitted in June 2017 with cardiogenic shock. Required milrinone + IV lasix. Milrinone was weaned off.   He has been followed in the HF clinic and was last seen July 2017. At that time he was stable but has not followed up.   Over last few weeks he has had increased dyspnea and fatigue. He has been working full time in [employer], [employer] but reports fatigue and increase leg edema. Has had some really bad days. Has had syncope x 2 while driving. Also reports episodes of dizziness. Says he has been taking extra torsemide for swelling. Has been out of losartan and potassium. He was supposed to be on amiodarone but does not have bottle in his bag.  Today he arrived to the ED via EMS after syncopal episode and injury to head from fall. Todays device interrogation shows multiple episode of of VT. Received IV fluids in the ED. CT of head negative. CXR with pulmonary edema.  Pertinent labs include: K 2.8, Creatinine 1.28, BNP 3542, Hgb 14.7, Troponin 0.05, INR 2.82 .      Echo 05/14/16 LVEF 10%, Mod MR, Mod LAE, Mild RAE, Severe TR  Review of Systems: [y] = yes, [ ]  = no   General: Weight gain [Y ]; Weight loss [ ] ; Anorexia [ ] ; Fatigue [ Y]; Fever [ ] ; Chills [ ] ; Weakness [Y ]  Cardiac: Chest pain/pressure [ ] ; Resting SOB [ ] ; Exertional SOB [Y ]; Orthopnea [ ] ; Pedal Edema [Y ]; Palpitations [ ] ; Syncope [Y ]; Presyncope [ ] ; Paroxysmal nocturnal dyspnea[ ]   Pulmonary: Cough [ ] ; Wheezing[ ] ; Hemoptysis[ ] ; Sputum [ ] ; Snoring [ ]   GI: Vomiting[ ] ; Dysphagia[ ] ; Melena[ ] ;  Hematochezia [ ] ; Heartburn[ ] ; Abdominal pain [ ] ; Constipation [ ] ; Diarrhea [ ] ; BRBPR [ ]   GU: Hematuria[ ] ; Dysuria [ ] ; Nocturia[ ]   Vascular: Pain in legs with walking [ ] ; Pain in feet with lying flat [ ] ; Non-healing sores [ ] ; Stroke [ ] ; TIA [ ] ; Slurred speech [ ] ;  Neuro: Headaches[ ] ; Vertigo[ ] ; Seizures[ ] ; Paresthesias[ ] ;Blurred vision [ ] ; Diplopia [ ] ; Vision changes [ ]   Ortho/Skin: Arthritis [ ] ; Joint pain [ ] ; Muscle pain [ ] ; Joint swelling [ ] ; Back Pain [ ] ; Rash [ ]   Psych: Depression[ ] ; Anxiety[ ]   Heme: Bleeding problems [ ] ; Clotting disorders [ ] ; Anemia [ ]   Endocrine: Diabetes [ ] ; Thyroid dysfunction[ ]   Home Medications Prior to Admission medications   Medication Sig Start Date End Date Taking? Authorizing Provider  allopurinol (ZYLOPRIM) 100 MG tablet Take 100 mg by mouth daily.    Historical Provider, MD  amiodarone (PACERONE) 200 MG tablet Take 1 tablet (200 mg total) by mouth daily. 02/28/16   Duke Salvia, MD  digoxin (LANOXIN) 0.25 MG tablet Take 1/2 tablet (125 mcg) once daily    Historical Provider, MD  isosorbide-hydrALAZINE (BIDIL) 20-37.5 MG tablet Take 1 tablet by mouth 2 (two) times daily.    Historical Provider, MD  losartan (COZAAR) 25 MG tablet Take 25 mg by  mouth daily.    Historical Provider, MD  nicotine (NICODERM CQ - DOSED IN MG/24 HOURS) 14 mg/24hr patch Place 1 patch (14 mg total) onto the skin daily. 01/01/16   Rinaldo Cloud, MD  potassium chloride SA (K-DUR,KLOR-CON) 20 MEQ tablet Take 2 tablets (40 mEq total) by mouth daily. 05/18/16   Rinaldo Cloud, MD  spironolactone (ALDACTONE) 25 MG tablet Take 1 tablet (25 mg total) by mouth daily. 05/18/16   Rinaldo Cloud, MD  torsemide (DEMADEX) 100 MG tablet Take 100 mg by mouth 2 (two) times daily.    Historical Provider, MD  warfarin (COUMADIN) 7.5 MG tablet Take 1 tablet (7.5 mg total) by mouth daily at 6 PM. 05/18/16   Rinaldo Cloud, MD    Past Medical History: Past Medical History:   Diagnosis Date  . AICD (automatic cardioverter/defibrillator) present    [employer] 100  . Alcohol abuse    hx  . Cardiomyopathy    secondary  . CHF (congestive heart failure) (HCC)   . Chronic anticoagulation    Coumadin for hx LV thrombus and PAF  . Gout   . HTN (hypertension)    uncontrolled  . Tobacco abuse   . Ventricular tachycardia [facility])     Past Surgical History: Past Surgical History:  Procedure Laterality Date  . CARDIAC DEFIBRILLATOR PLACEMENT  2010   [employer]leigen 100  . EMBOLECTOMY Right 06/04/2013   Procedure: Upper Extremity Thrombectomy;  Surgeon: Nada Libman, MD;  Location: [facility] OR;  Service: Vascular;  Laterality: Right;    Family History: Family History  Problem Relation Age of Onset  . Cardiomyopathy Father     on a defib  . Diabetes Father     Social History: Social History   Social History  . Marital status: Married    Spouse name: N/A  . Number of children: N/A  . Years of education: N/A   Occupational History  . Disabled    Social History Main Topics  . Smoking status: Current Some Day Smoker    Packs/day: 0.50    Years: 30.00    Types: Cigarettes  . Smokeless tobacco: Never Used     Comment: I already have information on quitting  . Alcohol use 0.6 oz/week    1 Cans of beer per week     Comment: None in 3 months  . Drug use: No     Comment: history of cocaine use that is not very recent (1-2 yrs)  . Sexual activity: Yes    Birth control/ protection: None   Other Topics Concern  . None   Social History Narrative   Disabled. Did not finish the 12th grade but is interested in pursuing a GED.     Allergies:  No Known Allergies  Objective:    Vital Signs:   Pulse Rate:  [38-79] 78 (11/15 1200) Resp:  [20-28] 28 (11/15 1200) BP: (109-127)/(81-91) 109/81 (11/15 1200) SpO2:  [97 %-99 %] 99 % (11/15 1200) Weight:  [180 lb (81.6 kg)] 180 lb (81.6 kg) (11/15 0958)    Weight change: Filed  Weights   10/01/2016 0958  Weight: 180 lb (81.6 kg)    Intake/Output:  No intake or output data in the 24 hours ending 10/09/2016 1412   Physical Exam: General:  Lying in bed  No resp difficulty HEENT: normal Neck: supple. JVP to ear . Carotids 2+ bilat; no bruits. No lymphadenopathy or thryomegaly appreciated. Cor: PMI nondisplaced. Regular rate & rhythm. No rubs, +  S3 2/6 TR Lungs: LLL crackles on 2 liters oxygen.  Abdomen: soft, nontender,distended. No hepatosplenomegaly. No bruits or masses. Good bowel sounds. Extremities: warm, no cyanosis, clubbing, rash, R and LLE 2+ edema Neuro: alert & orientedx3, cranial nerves grossly intact. moves all 4 extremities w/o difficulty. Affect pleasant  Telemetry: Sinus Rhythm PVCs.   Labs: Basic Metabolic Panel:  Recent Labs Lab October 26, 2016 0957  NA 138  K 2.8*  CL 94*  CO2 31  GLUCOSE 151*  BUN 18  CREATININE 1.28*  CALCIUM 8.6*    Liver Function Tests:  Recent Labs Lab 26-Oct-2016 0957  AST 31  ALT 16*  ALKPHOS 88  BILITOT 2.8*  PROT 7.5  ALBUMIN 3.3*   No results for input(s): LIPASE, AMYLASE in the last 168 hours. No results for input(s): AMMONIA in the last 168 hours.  CBC:  Recent Labs Lab 10/26/2016 0957  WBC 6.6  NEUTROABS 3.1  HGB 14.7  HCT 43.9  MCV 87.8  PLT 169    Cardiac Enzymes: No results for input(s): CKTOTAL, CKMB, CKMBINDEX, TROPONINI in the last 168 hours.  BNP: BNP (last 3 results)  Recent Labs  05/16/16 0420 06/21/16 1218 Oct 26, 2016 0957  BNP 2,453.6* 2,504.3* 3,542.3*    ProBNP (last 3 results) No results for input(s): PROBNP in the last 8760 hours.   CBG: No results for input(s): GLUCAP in the last 168 hours.  Coagulation Studies:  Recent Labs  26-Oct-2016 0957  LABPROT 30.2*  INR 2.82    Other results: EKG: Sinus Rhythm [place]  Imaging: Ct Head Wo Contrast  Result Date: 10/26/16 CLINICAL DATA:  Syncope. EXAM: CT HEAD WITHOUT CONTRAST TECHNIQUE: Contiguous axial  images were obtained from the base of the skull through the vertex without intravenous contrast. COMPARISON:  CT scan of July 03, 2014. 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 No mass effect or midline shift is noted. Ventricular size is within normal limits. There is no evidence of mass lesion, hemorrhage or acute infarction. Vascular: Atherosclerosis of carotid siphons is noted. Skull: Bony calvarium is unremarkable. Sinuses/Orbits: Visualized paranasal sinuses appear normal. Other: None. IMPRESSION: Stable old infarctions as described above. No acute intracranial abnormality seen. Electronically Signed   By: Lupita Raider, M.D.   On: 10/26/16 11:54   Dg Chest Port 1 View  Result Date: 2016-10-26 CLINICAL DATA:  Syncope today, cough for 1 week EXAM: PORTABLE CHEST 1 VIEW COMPARISON:  05/13/2016 FINDINGS: There is mild bilateral interstitial thickening. There is no focal parenchymal opacity. There is no pleural effusion or pneumothorax. There is stable cardiomegaly. There is a dual lead AICD. The osseous structures are unremarkable. IMPRESSION: Cardiomegaly with mild pulmonary vascular congestion. Electronically Signed   By: Elige Ko   On: 10/26/2016 10:38      Medications:     Current Medications: . furosemide  80 mg Intravenous BID  . potassium chloride  40 mEq Oral BID     Infusions:    Assessment/Plan/Discussio   1. Syncope- VT [employer]  Device interrogation showed multiple episodes of VT. Prior to admit he was supposed to be on amio daily. We called his pharmacy today and he last picked up prescription for amio in Apri 2017. Load amio per EP. K 2.8. Mag pending. K supplemented 2. A/C Systolic Heart Failure- NICM. ECHO EF 10%. June 2017. Marland Kitchen BNP >3000. Marked volume overload with prominent S3. Appears warm and wet. Diurese with 80 mg IV lasix twice a  day. No bb for now with acute decompensation.  Add 0.125 mg digoxin and 12.5 mg spiro. Place PICC for CVP and CO-OX. Low threshold to add inotropes.  Repeat ECHO. Follow renal function closely 3. PAF- starting amio drip. On coumadin. INR 2.8 Pharmacy to dose coumadin. 4. H/O LV thrombus 5. CKD III- watch renal function closely.  6. H/O ETOH  7. H/O tobacco abuse 8. Hypokalemia- K 2.8  Admit to stepdown.   Length of Stay: 0  Amy Clegg NP-C  10/01/2016, 2:12 PM  Advanced Heart Failure Team Pager [phone] (M-F; 7a - 4p)  Please contact [facility] for night-coverage after hours (4p -7a ) and weekends on [email or web address]  Patient seen and examined with Tonye BecketAmy Clegg, NP. We discussed all aspects of the encounter. I agree with the assessment and plan as stated above.   50 y/o male with severe systolic HF EF 10-15% now admitted with syncope due to recurrent VT in setting of hypokalemia and decompensated HF. Has prominent s3 and volume overload. Will diureses with IV lasix. Supp K+. If does not respond to diuresis will need PICC for CVP and co-ox. Low threshold for inotropes. Add digoxin and spiro. IV amio per EP.   Duval Macleod,MD 3:30 PM

## 2016-10-10 NOTE — H&P (Signed)
ELECTROPHYSIOLOGY CONSULT NOTE    Patient ID: Parker Johnson MRN: [id number], DOB/AGE: Sep 30, 1966 50 y.o.  Admit date: 10/20/2016 Date of Consult: 09/30/2016  Primary Physician: Rinaldo Cloud, MD Heart Failure: Shirlee Latch Electrophysiologist: Mathis Bud MD: ER   Reason for Consultation: syncope and ICD shocks   HPI:  Parker Johnson is a 50 y.o. male with a past medical history significant for NICM, chronic systolic heart failure, paroxysmal atrial fibrillation, CKD.  He reports that for the last several weeks, he has been having episodes of syncope that occur with preceding dizziness and happen while standing and seated. He also has been taking extra Torsemide because he is worried about gaining extra fluid.  He has been compliant with medications otherwise. Although he notes that he has run out of his medications over the last few days and weeks  He has been having problems with peripheral edema as well as abdominal distention with worsening DOE  Echocardiogram 6/17 EF 10% with moderate MR and moderate LAE and severe TR    He has not had follow up with the AHF clinic since July but has been seeing Dr Sharyn Lull.  He occasionally eats [employer] sausage but is trying to comply with low sodium diet.  Device interrogation today demonstrates multiple episodes of VT treated with ICD therapy since 09/09/16.  His recurrent syncope correlated with ICD therapy.    He currently denies shortness of breath at rest, chest pain, recent fevers, chills, nausea or vomiting  Lab work is notable for BNP of >3500, K 2.8, creat 1.28.   Past Medical History:  Diagnosis Date  . AICD (automatic cardioverter/defibrillator) present    [employer] 100  . Alcohol abuse    hx  . Cardiomyopathy    secondary  . CHF (congestive heart failure) (HCC)   . Chronic anticoagulation    Coumadin for hx LV thrombus and PAF  . Gout   . HTN (hypertension)    uncontrolled  . Tobacco abuse   .  Ventricular tachycardia [facility]: [facility] Rhonda W)      Surgical History:  Past Surgical History:  Procedure Laterality Date  . CARDIAC DEFIBRILLATOR PLACEMENT  2010   [employer] 100  . EMBOLECTOMY Right 06/04/2013   Procedure: Upper Extremity Thrombectomy;  Surgeon: Nada Libman, MD;  Location[facility] OR;  Service: Vascular;  Laterality: Right;    No current facility-administered medications for this encounter.   Current Outpatient Prescriptions:  .  allopurinol (ZYLOPRIM) 100 MG tablet, Take 100 mg by mouth daily., Disp: , Rfl:  .  amiodarone (PACERONE) 200 MG tablet, Take 1 tablet (200 mg total) by mouth daily., Disp: 30 tablet, Rfl: 3 .  digoxin (LANOXIN) 0.25 MG tablet, Take 1/2 tablet (125 mcg) once daily, Disp: , Rfl:  .  isosorbide-hydrALAZINE (BIDIL) 20-37.5 MG tablet, Take 1 tablet by mouth 2 (two) times daily., Disp: , Rfl:  .  losartan (COZAAR) 25 MG tablet, Take 25 mg by mouth daily., Disp: , Rfl:  .  nicotine (NICODERM CQ - DOSED IN MG/24 HOURS) 14 mg/24hr patch, Place 1 patch (14 mg total) onto the skin daily., Disp: 28 patch, Rfl: 0 .  potassium chloride SA (K-DUR,KLOR-CON) 20 MEQ tablet, Take 2 tablets (40 mEq total) by mouth daily., Disp: 30 tablet, Rfl: 3 .  spironolactone (ALDACTONE) 25 MG tablet, Take 1 tablet (25 mg total) by mouth daily., Disp: 30 tablet, Rfl: 3 .  torsemide (DEMADEX) 100 MG tablet, Take 100 mg by mouth 2 (two) times daily.,  Disp: , Rfl:  .  warfarin (COUMADIN) 7.5 MG tablet, Take 1 tablet (7.5 mg total) by mouth daily at 6 PM., Disp: 35 tablet, Rfl: 3  Inpatient Medications:   Allergies: No Known Allergies  Social History   Social History  . Marital status: Married    Spouse name: N/A  . Number of children: N/A  . Years of education: N/A   Occupational History  . Disabled    Social History Main Topics  . Smoking status: Current Some Day Smoker    Packs/day: 0.50    Years: 30.00    Types: Cigarettes  . Smokeless tobacco: Never Used      Comment: I already have information on quitting  . Alcohol use 0.6 oz/week    1 Cans of beer per week     Comment: None in 3 months  . Drug use: No     Comment: history of cocaine use that is not very recent (1-2 yrs)  . Sexual activity: Yes    Birth control/ protection: None   Other Topics Concern  . Not on file   Social History Narrative   Disabled. Did not finish the 12th grade but is interested in pursuing a GED.      Family History  Problem Relation Age of Onset  . Cardiomyopathy Father     on a defib  . Diabetes Father      Review of Systems: All other systems reviewed and are otherwise negative except as noted above.  Physical Exam: Vitals:   10/16/2016 0954 10/13/2016 0958 10/19/2016 1045 10/21/2016 1200  BP: 127/81  111/91 109/81  Pulse: (!) 38  79 78  Resp: (!) 27  20 (!) 28  SpO2: 98%  99% 99%  Weight:  180 lb (81.6 kg)    Height:  5\' 9"  (1.753 m)         GEN- The patient is well appearing, alert and oriented x 3 today.   H : normocephalic, atraumatic EENT; sclera clear, conjunctiva pink; hearing intact ; neck supple  Neck + JVP 8-10 cm  Supple Back without kyphosis Lungs- normal work of breathing, diffuse wheezing and rhonchi  Heart- Regular rate and rhythm, 2/6 SEM, +S3  GI- soft, non-tender, non-distended, bowel sounds present  Extremities- no clubbing, cyanosis, 2+ BLE edema MS- no significant deformity or atrophy Skin- warm and dry, no rash or lesion, left chest ICD incision well healed Psych- euthymic mood, full affect Neuro- strength and sensation are intact  Labs:   Lab Results  Component Value Date   WBC 6.6 09/26/2016   HGB 14.7 10/25/2016   HCT 43.9 10/17/2016   MCV 87.8 10/23/2016   PLT 169 10/17/2016    Recent Labs Lab 10/08/2016 0957  NA 138  K 2.8*  CL 94*  CO2 31  BUN 18  CREATININE 1.28*  CALCIUM 8.6*  PROT 7.5  BILITOT 2.8*  ALKPHOS 88  ALT 16*  AST 31  GLUCOSE 151*      Radiology/Studies: Ct Head Wo Contrast Result  Date: 10/25/2016 CLINICAL DATA:  Syncope. EXAM: CT HEAD WITHOUT CONTRAST TECHNIQUE: Contiguous axial images were obtained from the base of the skull through the vertex without intravenous contrast. COMPARISON:  CT scan of July 03, 2014. 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 No mass effect or midline shift is noted. Ventricular size is within normal limits. There is no evidence of mass lesion, hemorrhage or acute infarction. Vascular: Atherosclerosis  of carotid siphons is noted. Skull: Bony calvarium is unremarkable. Sinuses/Orbits: Visualized paranasal sinuses appear normal. Other: None. IMPRESSION: Stable old infarctions as described above. No acute intracranial abnormality seen. Electronically Signed   By: Lupita Raider, M.D.   On: 10/04/2016 11:54   Dg Chest Port 1 View Result Date: 10/23/2016 CLINICAL DATA:  Syncope today, cough for 1 week EXAM: PORTABLE CHEST 1 VIEW COMPARISON:  05/13/2016 FINDINGS: There is mild bilateral interstitial thickening. There is no focal parenchymal opacity. There is no pleural effusion or pneumothorax. There is stable cardiomegaly. There is a dual lead AICD. The osseous structures are unremarkable. IMPRESSION: Cardiomegaly with mild pulmonary vascular congestion. Electronically Signed   By: Elige Ko   On: 10/03/2016 10:38    SRP:RXYVO rhythm, bigeminal PVC's  TELEMETRY: sinus rhythm  DEVICE HISTORY: BSX dual chamber ICD implanted 2010 for primary prevention   Device reports were personally reviewed as noted above demonstrated mostly long- short couple polymorphic ventricular tachycardia; device function is otherwise normal  Assessment/Plan: 1. VT storm The patient has had recurrent ventricular tachycardia over the last several weeks corresponding with syncopal spells.  Majority of episodes start with long-short initiation.  He was supposed to have been taking amiodarone at home but has  not had filled since April. Will start IV Amiodarone for now to help suppress arrhythmias.   Keep K >3.9, Mg>1.8 No driving x6 months  2.  Acute on chronic systolic heart failure Clearly volume overloaded on exam Have asked the AHF team to see patient and help manage volume status  3.  CKD, stage III Stable No change required today  4.  Paroxysmal atrial fibrillation Currently in SR Continue Warfarin for CHADS2VASC of 2 long term Will defer need to hold Warfarin to HF team  5.  Hypokalemia Will replete  Dr Graciela Husbands to see later today   Signed, Gypsy Balsam, NP 10/13/2016 1:08 PM  Agree with notes as outlined above. Nonischemic cardiomyopathy acute on chronic congestive heart failure poor medication compliance presenting with recurrent syncope associated with mostly long-short triggered polymorphic ventricular tachycardia;  he is noted to be quite hypokalemic. He has significant valvular disease and I suspect his electrical irritability relates both to hypokalemia as well as electromechanical interactions from his heart failure.  At his last hospitalization he was able to tolerate inotropic support; PICC line and co-ox measurements are anticipated.  In the event that inotropes are needed, hopefully they will not be proarrhythmic. In this regard, we will be very aggressive at trying to replete his potassium deficit and resume his Aldactone (done)  For now, we'll use amiodarone for rhythm support. Replete potassium aggressively. I added magnesium.  While by this evening the nurse noted that he had not urinated in response to his IV Lasix; I've ordered another dose with support of Zaroxolyn.  A repeat metabolic profile is ordered

## 2016-10-10 NOTE — ED Provider Notes (Signed)
[facility]-EMERGENCY DEPT Provider Note   CSN: [id number] Arrival date & time: 11/22/2016  [id number]     History   Chief Complaint Chief Complaint  Patient presents with  . Loss of Consciousness  . Irregular Heart Beat    HPI Parker Johnson is a 50 y.o. male.  HPI Patient presents after witnessed syncopal episode. States he was standing in the kitchen and became lightheaded. Felt backwards and struck the back of his head on the wall. Question loss of consciousness roughly to 3 minutes. No known defibrillator firing. States he's had ongoing shortness of breath especially with exertion. Denies any chest pain. States he's had increasingly frequent syncopal episodes over the last few months. No recent fever or chills. No neck pain. No focal weakness or numbness. Past Medical History:  Diagnosis Date  . AICD (automatic cardioverter/defibrillator) present    [employer] Teligen 100  . Alcohol abuse    hx  . Cardiomyopathy    secondary  . CHF (congestive heart failure) (HCC)   . Chronic anticoagulation    Coumadin for hx LV thrombus and PAF  . Gout   . HTN (hypertension)    uncontrolled  . Tobacco abuse   . Ventricular tachycardia [facility](HCC)     Patient Active Problem List   Diagnosis Date Noted  . Ventricular fibrillation (HCC) 2016/05/01  . Hypokalemia 05/13/2016  . CHF (congestive heart failure) (HCC) 05/13/2016  . Chest pain 05/13/2016  . Chronic anticoagulation   . AICD -DDD  [employer] MADIT-RIT 03/13/2011  . TOBACCO ABUSE 07/29/2009  . HYPERTENSION, UNCONTROLLED 07/29/2009  . CARDIOMYOPATHY, SECONDARY 07/29/2009  . VENTRICULAR TACHYCARDIA 07/29/2009  . PAROXYSMAL ATRIAL FIBRILLATION 07/29/2009  . CHF 07/29/2009  . ALCOHOL ABUSE, HX OF 07/29/2009    Past Surgical History:  Procedure Laterality Date  . CARDIAC DEFIBRILLATOR PLACEMENT  2010   [employer] tleigen 100  . EMBOLECTOMY Right 06/04/2013   Procedure: Upper Extremity Thrombectomy;  Surgeon: Nada LibmanVance W  Brabham, MD;  [facility];  Service: Vascular;  Laterality: Right;       Home Medications    Prior to Admission medications   Medication Sig Start Date End Date Taking? Authorizing Provider  allopurinol (ZYLOPRIM) 100 MG tablet Take 100 mg by mouth daily.   Yes Historical Provider, MD  digoxin (LANOXIN) 0.25 MG tablet Take 0.25 mg by mouth daily.    Yes Historical Provider, MD  potassium chloride SA (K-DUR,KLOR-CON) 20 MEQ tablet Take 2 tablets (40 mEq total) by mouth daily. 05/18/16  Yes Rinaldo CloudMohan Harwani, MD  spironolactone (ALDACTONE) 25 MG tablet Take 1 tablet (25 mg total) by mouth daily. 05/18/16  Yes Rinaldo CloudMohan Harwani, MD  torsemide (DEMADEX) 100 MG tablet Take 100 mg by mouth 2 (two) times daily.   Yes Historical Provider, MD  warfarin (COUMADIN) 7.5 MG tablet Take 1 tablet (7.5 mg total) by mouth daily at 6 PM. Patient taking differently: Take 3.75-7.5 mg by mouth daily at 6 PM. Patient takes 7.5 mg everyday except on Sunday patient takes 3.75 mg 05/18/16  Yes Rinaldo CloudMohan Harwani, MD  amiodarone (PACERONE) 200 MG tablet Take 1 tablet (200 mg total) by mouth daily. Patient not taking: Reported on 2016/05/01 02/28/16   Duke SalviaSteven C Klein, MD  nicotine (NICODERM CQ - DOSED IN MG/24 HOURS) 14 mg/24hr patch Place 1 patch (14 mg total) onto the skin daily. Patient not taking: Reported on 2016/05/01 01/01/16   Rinaldo CloudMohan Harwani, MD    Family History Family History  Problem Relation Age of Onset  .  Cardiomyopathy Father     on a defib  . Diabetes Father     Social History Social History  Substance Use Topics  . Smoking status: Current Some Day Smoker    Packs/day: 0.50    Years: 30.00    Types: Cigarettes  . Smokeless tobacco: Never Used     Comment: I already have information on quitting  . Alcohol use 0.6 oz/week    1 Cans of beer per week     Comment: None in 3 months     Allergies   Patient has no known allergies.   Review of Systems Review of Systems  Constitutional: Negative for  chills.  HENT: Negative for facial swelling.   Respiratory: Positive for shortness of breath. Negative for cough.   Cardiovascular: Positive for leg swelling. Negative for chest pain and palpitations.  Gastrointestinal: Negative for abdominal pain, diarrhea, nausea and vomiting.  Genitourinary: Negative for dysuria, flank pain and frequency.  Musculoskeletal: Negative for back pain, neck pain and neck stiffness.  Skin: Positive for wound. Negative for rash.  Neurological: Positive for dizziness, syncope and light-headedness. Negative for weakness, numbness and headaches.  All other systems reviewed and are negative.    Physical Exam Updated Vital Signs BP 98/78 (BP Location: Left Arm)   Pulse 89   Temp 98 F (36.7 C) (Oral)   Resp (!) 23   Ht 5\' 7"  (1.702 m)   Wt 180 lb 4.8 oz (81.8 kg)   SpO2 94%   BMI 28.24 kg/m   Physical Exam  Constitutional: He is oriented to person, place, and time. He appears well-developed and well-nourished. No distress.  HENT:  Head: Normocephalic.  Mouth/Throat: Oropharynx is clear and moist.  Small abrasion to posterior scalp. No intraoral trauma.  Eyes: EOM are normal. Pupils are equal, round, and reactive to light.  Neck: Normal range of motion. Neck supple.  No posterior midline cervical tenderness to palpation.  Cardiovascular: Normal rate.  Exam reveals no gallop and no friction rub.   Murmur heard. Irregular  Pulmonary/Chest: Effort normal and breath sounds normal. No respiratory distress. He has no wheezes. He has no rales. He exhibits no tenderness.  Abdominal: Soft. Bowel sounds are normal. There is no tenderness. There is no rebound and no guarding.  Musculoskeletal: Normal range of motion. He exhibits edema. He exhibits no tenderness.  1+ bilateral pitting edema. No asymmetry or tenderness. Distal pulses intact. No midline thoracic or lumbar tenderness. Pelvis is stable.  Neurological: He is alert and oriented to person, place, and  time.  5/5 motor in all extremity. Sensation is fully intact.  Skin: Skin is warm and dry. Capillary refill takes less than 2 seconds. No rash noted. No erythema.  Psychiatric: He has a normal mood and affect. His behavior is normal.  Nursing note and vitals reviewed.    ED Treatments / Results  Labs (all labs ordered are listed, but only abnormal results are displayed) Labs Reviewed  CBC WITH DIFFERENTIAL/PLATELET - Abnormal; Notable for the following:       Result Value   RDW 21.1 (*)    Eosinophils Absolute 2.0 (*)    All other components within normal limits  BRAIN NATRIURETIC PEPTIDE - Abnormal; Notable for the following:    B Natriuretic Peptide 3,542.3 (*)    All other components within normal limits  COMPREHENSIVE METABOLIC PANEL - Abnormal; Notable for the following:    Potassium 2.8 (*)    Chloride 94 (*)    Glucose,  Bld 151 (*)    Creatinine, Ser 1.28 (*)    Calcium 8.6 (*)    Albumin 3.3 (*)    ALT 16 (*)    Total Bilirubin 2.8 (*)    All other components within normal limits  PROTIME-INR - Abnormal; Notable for the following:    Prothrombin Time 30.2 (*)    All other components within normal limits  BASIC METABOLIC PANEL - Abnormal; Notable for the following:    Chloride 93 (*)    Glucose, Bld 252 (*)    BUN 24 (*)    Creatinine, Ser 1.67 (*)    Calcium 8.3 (*)    GFR calc non Af Amer 47 (*)    GFR calc Af Amer 54 (*)    All other components within normal limits  MAGNESIUM - Abnormal; Notable for the following:    Magnesium 1.5 (*)    All other components within normal limits  HEPATIC FUNCTION PANEL - Abnormal; Notable for the following:    Albumin 3.1 (*)    AST 62 (*)    Total Bilirubin 2.8 (*)    Bilirubin, Direct 1.2 (*)    Indirect Bilirubin 1.6 (*)    All other components within normal limits  PROTIME-INR - Abnormal; Notable for the following:    Prothrombin Time 44.3 (*)    INR 4.54 (*)    All other components within normal limits  BASIC  METABOLIC PANEL - Abnormal; Notable for the following:    Chloride 94 (*)    Glucose, Bld 133 (*)    BUN 22 (*)    Creatinine, Ser 1.47 (*)    Calcium 8.6 (*)    GFR calc non Af Amer 54 (*)    All other components within normal limits  LACTIC ACID, PLASMA - Abnormal; Notable for the following:    Lactic Acid, Venous 2.8 (*)    All other components within normal limits  LACTIC ACID, PLASMA - Abnormal; Notable for the following:    Lactic Acid, Venous 3.9 (*)    All other components within normal limits  MAGNESIUM - Abnormal; Notable for the following:    Magnesium 2.7 (*)    All other components within normal limits  BASIC METABOLIC PANEL - Abnormal; Notable for the following:    Sodium 129 (*)    Chloride 88 (*)    Glucose, Bld 355 (*)    BUN 25 (*)    Creatinine, Ser 1.81 (*)    Calcium 8.4 (*)    GFR calc non Af Amer 42 (*)    GFR calc Af Amer 49 (*)    All other components within normal limits  MRSA PCR SCREENING  MAGNESIUM  DIGOXIN LEVEL  TSH  COOXEMETRY PANEL  COOXEMETRY PANEL  COOXEMETRY PANEL  I-STAT TROPOININ, ED    EKG  EKG Interpretation  Date/Time:  Wednesday October 10 2016 09:50:29 EST Ventricular Rate:  83 PR Interval:    QRS Duration: 104 QT Interval:  436 QTC Calculation: 500 R Axis:   -102 Text Interpretation:  Sinus rhythm Ventricular bigeminy Inferior infarct, old Probable anterolateral infarct, old Confirmed by Ranae Palms  MD, Calixto Pavel ([id number]) on 10/11/2016 3:27:44 PM       Radiology Ct Head Wo Contrast  Result Date: 09/30/2016 CLINICAL DATA:  Syncope. EXAM: CT HEAD WITHOUT CONTRAST TECHNIQUE: Contiguous axial images were obtained from the base of the skull through the vertex without intravenous contrast. COMPARISON:  CT scan of July 03, 2014. 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 No mass effect or midline shift is noted. Ventricular size is within normal limits. There  is no evidence of mass lesion, hemorrhage or acute infarction. Vascular: Atherosclerosis of carotid siphons is noted. Skull: Bony calvarium is unremarkable. Sinuses/Orbits: Visualized paranasal sinuses appear normal. Other: None. IMPRESSION: Stable old infarctions as described above. No acute intracranial abnormality seen. Electronically Signed   By: Lupita Raider, M.D.   On: 09/26/2016 11:54   Dg Chest Port 1 View  Result Date: 09/29/2016 CLINICAL DATA:  PICC line placement EXAM: PORTABLE CHEST 1 VIEW COMPARISON:  10/07/2016 at 10:29 FINDINGS: There is a new right upper extremity PICC line extending into the low SVC. There is marked unchanged cardiomegaly. No airspace consolidation. No large effusion. No pneumothorax. IMPRESSION: Satisfactorily positioned right upper extremity PICC line. No other significant interval changes. Electronically Signed   By: Ellery Plunk M.D.   On: 10/06/2016 21:01   Dg Chest Port 1 View  Result Date: 09/27/2016 CLINICAL DATA:  Syncope today, cough for 1 week EXAM: PORTABLE CHEST 1 VIEW COMPARISON:  05/13/2016 FINDINGS: There is mild bilateral interstitial thickening. There is no focal parenchymal opacity. There is no pleural effusion or pneumothorax. There is stable cardiomegaly. There is a dual lead AICD. The osseous structures are unremarkable. IMPRESSION: Cardiomegaly with mild pulmonary vascular congestion. Electronically Signed   By: Elige Ko   On: 10/05/2016 10:38    Procedures Procedures (including critical care time)  Medications Ordered in ED Medications  potassium chloride SA (K-DUR,KLOR-CON) CR tablet 40 mEq (40 mEq Oral Given 10/11/16 0944)  sodium chloride flush (NS) 0.9 % injection 3 mL (3 mLs Intravenous Given 10/11/16 1000)  sodium chloride flush (NS) 0.9 % injection 3 mL (not administered)  0.9 %  sodium chloride infusion (not administered)  acetaminophen (TYLENOL) tablet 650 mg (not administered)  ondansetron (ZOFRAN) injection 4 mg  (not administered)  digoxin (LANOXIN) tablet 0.125 mg (0.125 mg Oral Given 10/11/16 0944)  spironolactone (ALDACTONE) tablet 12.5 mg (12.5 mg Oral Given 10/11/16 0944)  amiodarone (NEXTERONE PREMIX) 360-4.14 MG/200ML-% (1.8 mg/mL) IV infusion (60 mg/hr Intravenous New Bag/Given 10/11/16 0649)  traMADol (ULTRAM) tablet 50 mg (50 mg Oral Given 10/24/2016 1624)  magnesium oxide (MAG-OX) tablet 400 mg (400 mg Oral Given 10/11/16 0944)  sodium chloride flush (NS) 0.9 % injection 10-40 mL (10 mLs Intracatheter Given 10/11/16 1000)  sodium chloride flush (NS) 0.9 % injection 10-40 mL (not administered)  norepinephrine (LEVOPHED) 4 mg in dextrose 5 % 250 mL (0.016 mg/mL) infusion (5 mcg/min Intravenous Rate/Dose Verify 10/11/16 1400)  milrinone (PRIMACOR) 20 MG/100 ML (0.2 mg/mL) infusion (0.375 mcg/kg/min  80.7 kg Intravenous Rate/Dose Verify 10/11/16 1400)  Influenza vac split quadrivalent PF (FLUARIX) injection 0.5 mL (not administered)  furosemide (LASIX) 160 mg in dextrose 5 % 50 mL IVPB (160 mg Intravenous Given 10/11/16 0830)  lidocaine (XYLOCAINE) 4 mg/mL bolus via infusion 100 mg (100 mg Intravenous Bolus from Bag 10/11/16 0826)    And  lidocaine (cardiac) IV  infusion 4 mg/mL (2 mg/min Intravenous Rate/Dose Verify 10/11/16 1400)  amiodarone (NEXTERONE PREMIX) 360-4.14 MG/200ML-% (1.8 mg/mL) IV infusion (30 mg/hr Intravenous Rate/Dose Verify 10/11/16 1400)  amiodarone (NEXTERONE PREMIX) 360-4.14 MG/200ML-% (1.8 mg/mL) IV infusion (not administered)  sodium chloride 0.9 % 1,000 mL with potassium chloride 80 mEq infusion ( Intravenous Given 10/22/2016 1149)  potassium chloride SA (K-DUR,KLOR-CON) CR tablet 40 mEq (40 mEq Oral Given 10/22/2016 1232)  amiodarone (NEXTERONE)  1.8 mg/mL load via infusion 150 mg (150 mg Intravenous Bolus from Bag 10/24/2016 1440)  metolazone (ZAROXOLYN) tablet 2.5 mg (2.5 mg Oral Given 10/20/2016 1735)  furosemide (LASIX) injection 80 mg (80 mg Intravenous Given 10/16/2016 2130)    furosemide (LASIX) injection 40 mg (40 mg Intravenous Given 10/22/2016 2307)  magnesium sulfate IVPB 2 g 50 mL (2 g Intravenous Given 10/11/16 0545)  magnesium sulfate IVPB 2 g 50 mL (2 g Intravenous Given 10/11/16 0732)  metolazone (ZAROXOLYN) tablet 5 mg (5 mg Oral Given 10/11/16 0827)     Initial Impression / Assessment and Plan / ED Course  I have reviewed the triage vital signs and the nursing notes.  Pertinent labs & imaging results that were available during my care of the patient were reviewed by me and considered in my medical decision making (see chart for details).  Clinical Course   Discussed with Dr.Harwani. Will see the patient in emergency department and admit.    Final Clinical Impressions(s) / ED Diagnoses   Final diagnoses:  Syncope and collapse    New Prescriptions Current Discharge Medication List       Loren Racer, MD 10/11/16 1528

## 2016-10-10 NOTE — Progress Notes (Signed)
ANTICOAGULATION CONSULT NOTE - Initial Consult  Pharmacy Consult for warfarin Indication: atrial fibrillation   Assessment: 50 y.o. male with a past medical history significant for NICM, chronic systolic heart failure, paroxysmal atrial fibrillation, CKD.  He reports that for the last several weeks, he has been having episodes of syncope that occur with preceding dizziness and happen while standing and seated.   Syncope likely related to VT noted on device interrogation. Patient on chronic warfarin therapy for PAF. INR therapeutic at 2.8. Note that he has ran out of several medications but stated compliance with warfarin as evidence by his INR.   No bleeding issues noted, cbc within normal range.   Goal of Therapy:  INR 2-3 Monitor platelets by anticoagulation protocol: Yes   Plan:  Continue warfarin 7.5mg  daily INR daily for now Patient being loaded with IV amiodarone for VT - watch INR closely   No Known Allergies  Patient Measurements: Height: 5\' 9"  (175.3 cm) Weight: 180 lb (81.6 kg) IBW/kg (Calculated) : 70.7  Vital Signs: BP: 109/81 (11/15 1200) Pulse Rate: 78 (11/15 1200)  Labs:  Recent Labs  October 30, 2016 0957  HGB 14.7  HCT 43.9  PLT 169  LABPROT 30.2*  INR 2.82  CREATININE 1.28*    Estimated Creatinine Clearance: 69.8 mL/min (by C-G formula based on SCr of 1.28 mg/dL (H)).   Medical History: Past Medical History:  Diagnosis Date  . AICD (automatic cardioverter/defibrillator) present    [employer] 100  . Alcohol abuse    hx  . Cardiomyopathy    secondary  . CHF (congestive heart failure) (HCC)   . Chronic anticoagulation    Coumadin for hx LV thrombus and PAF  . Gout   . HTN (hypertension)    uncontrolled  . Tobacco abuse   . Ventricular tachycardia (HCC)     [name] PharmD., BCPS Clinical Pharmacist Pager [phone] October 30, 2016 2:24 PM

## 2016-10-10 NOTE — ED Notes (Signed)
Attempted to call report x 1  

## 2016-10-10 NOTE — Progress Notes (Signed)
Pt having SOB and increased work of breathing with anxiety. 14 beat run VT. Lung sounds crackles. Low UOP since IV lasix was given in ED. CHF NP notified. New orders placed and initiated. PICC being placed currently. Once placed CVP and coox will be obtained.  Report given to night shift RN.

## 2016-10-10 NOTE — Progress Notes (Signed)
Peripherally Inserted Central Catheter/Midline Placement  The IV Nurse has discussed with the patient and/or persons authorized to consent for the patient, the purpose of this procedure and the potential benefits and risks involved with this procedure.  The benefits include less needle sticks, lab draws from the catheter, and the patient may be discharged home with the catheter. Risks include, but not limited to, infection, bleeding, blood clot (thrombus formation), and puncture of an artery; nerve damage and irregular heartbeat and possibility to perform a PICC exchange if needed/ordered by physician.  Alternatives to this procedure were also discussed.  B[employer] Power PICC patient education guide, fact sheet on infection prevention and patient information card has been provided to patient /or left at bedside.    PICC/Midline Placement Documentation        Parker Johnson, [name] 10/16/2016, 8:41 PM

## 2016-10-11 DIAGNOSIS — N179 Acute kidney failure, unspecified: Secondary | ICD-10-CM

## 2016-10-11 LAB — COOXEMETRY PANEL
CARBOXYHEMOGLOBIN: 1.2 % (ref 0.5–1.5)
METHEMOGLOBIN: 0.9 % (ref 0.0–1.5)
O2 Saturation: 61.6 %
Total hemoglobin: 14.3 g/dL (ref 12.0–16.0)

## 2016-10-11 LAB — BASIC METABOLIC PANEL
ANION GAP: 10 (ref 5–15)
Anion gap: 13 (ref 5–15)
BUN: 24 mg/dL — ABNORMAL HIGH (ref 6–20)
BUN: 25 mg/dL — AB (ref 6–20)
CHLORIDE: 93 mmol/L — AB (ref 101–111)
CHLORIDE: 88 mmol/L — AB (ref 101–111)
CO2: 32 mmol/L (ref 22–32)
CO2: 28 mmol/L (ref 22–32)
Calcium: 8.3 mg/dL — ABNORMAL LOW (ref 8.9–10.3)
Calcium: 8.4 mg/dL — ABNORMAL LOW (ref 8.9–10.3)
Creatinine, Ser: 1.67 mg/dL — ABNORMAL HIGH (ref 0.61–1.24)
Creatinine, Ser: 1.81 mg/dL — ABNORMAL HIGH (ref 0.61–1.24)
GFR calc non Af Amer: 47 mL/min — ABNORMAL LOW (ref >60)
GFR calc non Af Amer: 42 mL/min — ABNORMAL LOW (ref >60)
GFR, EST AFRICAN AMERICAN: 54 mL/min — AB (ref >60)
GFR, EST AFRICAN AMERICAN: 49 mL/min — AB (ref >60)
Glucose, Bld: 252 mg/dL — ABNORMAL HIGH (ref 65–99)
Glucose, Bld: 355 mg/dL — ABNORMAL HIGH (ref 65–99)
POTASSIUM: 3.9 mmol/L (ref 3.5–5.1)
POTASSIUM: 3.7 mmol/L (ref 3.5–5.1)
SODIUM: 135 mmol/L (ref 135–145)
SODIUM: 129 mmol/L — AB (ref 135–145)

## 2016-10-11 LAB — LACTIC ACID, PLASMA: Lactic Acid, Venous: 3.9 mmol/L (ref 0.5–1.9)

## 2016-10-11 LAB — HEPATIC FUNCTION PANEL
ALBUMIN: 3.1 g/dL — AB (ref 3.5–5.0)
ALT: 39 U/L (ref 17–63)
AST: 62 U/L — AB (ref 15–41)
Alkaline Phosphatase: 86 U/L (ref 38–126)
Bilirubin, Direct: 1.2 mg/dL — ABNORMAL HIGH (ref 0.1–0.5)
Indirect Bilirubin: 1.6 mg/dL — ABNORMAL HIGH (ref 0.3–0.9)
Total Bilirubin: 2.8 mg/dL — ABNORMAL HIGH (ref 0.3–1.2)
Total Protein: 6.8 g/dL (ref 6.5–8.1)

## 2016-10-11 LAB — MAGNESIUM
MAGNESIUM: 1.5 mg/dL — AB (ref 1.7–2.4)
Magnesium: 2.7 mg/dL — ABNORMAL HIGH (ref 1.7–2.4)

## 2016-10-11 LAB — TSH: TSH: 2.444 u[IU]/mL (ref 0.350–4.500)

## 2016-10-11 LAB — GLUCOSE, CAPILLARY: GLUCOSE-CAPILLARY: 186 mg/dL — AB (ref 65–99)

## 2016-10-11 LAB — PROTIME-INR
INR: 4.54 — AB
Prothrombin Time: 44.3 seconds — ABNORMAL HIGH (ref 11.4–15.2)

## 2016-10-11 MED ORDER — MAGNESIUM SULFATE 2 GM/50ML IV SOLN
2.0000 g | Freq: Once | INTRAVENOUS | Status: AC
Start: 2016-10-11 — End: 2016-10-11
  Administered 2016-10-11: 2 g via INTRAVENOUS

## 2016-10-11 MED ORDER — LIDOCAINE BOLUS VIA INFUSION
100.0000 mg | Freq: Once | INTRAVENOUS | Status: AC
  Administered 2016-10-11: 100 mg via INTRAVENOUS
  Filled 2016-10-11: qty 100

## 2016-10-11 MED ORDER — MAGNESIUM SULFATE 2 GM/50ML IV SOLN
2.0000 g | Freq: Once | INTRAVENOUS | Status: AC
  Administered 2016-10-11: 2 g via INTRAVENOUS

## 2016-10-11 MED ORDER — INSULIN ASPART 100 UNIT/ML ~~LOC~~ SOLN
0.0000 [IU] | Freq: Three times a day (TID) | SUBCUTANEOUS | Status: DC
  Administered 2016-10-13 – 2016-10-16 (×6): 1 [IU] via SUBCUTANEOUS
  Administered 2016-10-16: 2 [IU] via SUBCUTANEOUS
  Administered 2016-10-18 – 2016-10-19 (×3): 1 [IU] via SUBCUTANEOUS
  Administered 2016-10-19: 2 [IU] via SUBCUTANEOUS
  Administered 2016-10-20: 1 [IU] via SUBCUTANEOUS
  Administered 2016-10-20: 2 [IU] via SUBCUTANEOUS
  Administered 2016-10-21: 5 [IU] via SUBCUTANEOUS
  Administered 2016-10-21 – 2016-10-22 (×2): 1 [IU] via SUBCUTANEOUS
  Administered 2016-10-22 – 2016-10-25 (×3): 2 [IU] via SUBCUTANEOUS
  Administered 2016-10-26: 1 [IU] via SUBCUTANEOUS

## 2016-10-11 MED ORDER — PHYTONADIONE 5 MG PO TABS
5.0000 mg | ORAL_TABLET | Freq: Once | ORAL | Status: AC
Start: 2016-10-11 — End: 2016-10-11
  Administered 2016-10-11: 5 mg via ORAL
  Filled 2016-10-11: qty 1

## 2016-10-11 MED ORDER — AMIODARONE HCL IN DEXTROSE 360-4.14 MG/200ML-% IV SOLN
30.0000 mg/h | INTRAVENOUS | Status: AC
  Administered 2016-10-11: 30 mg/h via INTRAVENOUS

## 2016-10-11 MED ORDER — INFLUENZA VAC SPLIT QUAD 0.5 ML IM SUSY: 0.5000 mL | PREFILLED_SYRINGE | INTRAMUSCULAR | Status: DC

## 2016-10-11 MED ORDER — METOLAZONE 5 MG PO TABS
5.0000 mg | ORAL_TABLET | Freq: Once | ORAL | Status: AC
  Administered 2016-10-11: 5 mg via ORAL
  Filled 2016-10-11: qty 1

## 2016-10-11 MED ORDER — FUROSEMIDE 10 MG/ML IJ SOLN
160.0000 mg | Freq: Two times a day (BID) | INTRAVENOUS | Status: DC
  Administered 2016-10-11: 160 mg via INTRAVENOUS
  Filled 2016-10-11 (×2): qty 16

## 2016-10-11 MED ORDER — MAGNESIUM SULFATE 2 GM/50ML IV SOLN
INTRAVENOUS | Status: AC
  Administered 2016-10-11: 2 g via INTRAVENOUS
  Filled 2016-10-11: qty 50

## 2016-10-11 MED ORDER — INSULIN ASPART 100 UNIT/ML ~~LOC~~ SOLN: 0.0000 [IU] | Freq: Every day | SUBCUTANEOUS | Status: DC

## 2016-10-11 MED ORDER — DEXTROSE 5 % IV SOLN
160.0000 mg | Freq: Four times a day (QID) | INTRAVENOUS | Status: DC
  Administered 2016-10-11 – 2016-10-13 (×6): 160 mg via INTRAVENOUS
  Filled 2016-10-11 (×11): qty 16

## 2016-10-11 MED ORDER — AMIODARONE HCL IN DEXTROSE 360-4.14 MG/200ML-% IV SOLN
60.0000 mg/h | INTRAVENOUS | Status: DC
  Administered 2016-10-12: 30 mg/h via INTRAVENOUS
  Administered 2016-10-12 – 2016-10-17 (×20): 60 mg/h via INTRAVENOUS
  Filled 2016-10-11 (×24): qty 200

## 2016-10-11 MED ORDER — LIDOCAINE IN D5W 4-5 MG/ML-% IV SOLN
2.0000 mg/min | INTRAVENOUS | Status: DC
  Administered 2016-10-11 (×3): 2 mg/min via INTRAVENOUS
  Filled 2016-10-11 (×2): qty 500

## 2016-10-11 NOTE — Progress Notes (Signed)
ANTICOAGULATION CONSULT NOTE - Follow-Up Consult  Pharmacy Consult for warfarin Indication: atrial fibrillation  Assessment: 32 yoM with PMH significant for NICM, chronic systolic heart failure, paroxysmal atrial fibrillation, and CKD on warfarin PTA for PAF. INR therapeutic on admit at 2.8 now up to 4.54 after 7.5mg  x1 last night, possibly due to initiation of amiodarone. AST elevated this morning. No S/Sx bleeding noted.  PTA Warfarin dose: 7.5mg  daily except 3.75 mg on Sunday  Goal of Therapy:  INR 2-3 Monitor platelets by anticoagulation protocol: Yes   Plan:  -Hold warfarin tonight -Watch INR and S/Sx bleeding closely  No Known Allergies  Patient Measurements: Height: 5\' 7"  (170.2 cm) Weight: 180 lb 4.8 oz (81.8 kg) IBW/kg (Calculated) : 66.1  Vital Signs: Temp: 98.5 F (36.9 C) (11/16 0335) Temp Source: Oral (11/16 0335) BP: 136/76 (11/16 0600) Pulse Rate: 45 (11/16 0600)  Labs:  Recent Labs  November 07, 2016 0957 11-07-16 1835 10/11/16 0422  HGB 14.7  --   --   HCT 43.9  --   --   PLT 169  --   --   LABPROT 30.2*  --  44.3*  INR 2.82  --  4.54*  CREATININE 1.28* 1.47* 1.67*    Estimated Creatinine Clearance: 54.8 mL/min (by C-G formula based on SCr of 1.67 mg/dL (H)).   Medical History: Past Medical History:  Diagnosis Date  . AICD (automatic cardioverter/defibrillator) present    [employer] 100  . Alcohol abuse    hx  . Cardiomyopathy    secondary  . CHF (congestive heart failure) (HCC)   . Chronic anticoagulation    Coumadin for hx LV thrombus and PAF  . Gout   . HTN (hypertension)    uncontrolled  . Tobacco abuse   . Ventricular tachycardia [facility])     Fredonia Highland, PharmD PGY-1 Pharmacy Resident Pager: [phone] 10/11/2016

## 2016-10-11 NOTE — Care Management Note (Signed)
Case Management Note  Patient Details  Name: Parker Johnson MRN: [id number] Date of Birth: 1966/08/13  Subjective/Objective:          Adm w fld overload, ef 15%  Action/Plan: for milrinone and levophed   Expected Discharge Date:                  Expected Discharge Plan:  Home w Home Health Services  In-House Referral:     Discharge planning Services     Post Acute Care Choice:    Choice offered to:     DME Arranged:    DME Agency:     HH Arranged:    HH Agency:     Status of Service:  In process, will continue to follow  If discussed at Long Length of Stay Meetings, dates discussed:    Additional Comments:lives w fam, pcp dr Sharyn Lull, will moniter for dc needs as pt progresses. Hanley Hays, RN 10/11/2016, 2:01 PM

## 2016-10-11 NOTE — Progress Notes (Signed)
CRITICAL VALUE ALERT  Critical value received: Lactic Acid 3.9  Date of notification: 10/11/16  Time of notification: 1315  Critical value read back: yes  Nurse who received alert: Lindajo Royal, RN  MD notified (1st page): [place]  Time of first page: notified in person  No new orders received.

## 2016-10-11 NOTE — Progress Notes (Signed)
Advanced Heart Failure Rounding Note   Subjective:    Admitted with VT/syncope. PICC placed with initial CO-OX 26%. Started on milrinone 0.375 mcg + Norepi 5 mcg.   Todays CO-OX is 62%. Creatinine trending up 1.4>1.6. Sluggish urine output. Frequent ventricular ectopy.   He feels much better. Denies SOB.    Objective:   Weight Range:  Vital Signs:   Temp:  [97.2 F (36.2 C)-98.5 F (36.9 C)] 98.5 F (36.9 C) (11/16 0335) Pulse Rate:  [38-103] 45 (11/16 0600) Resp:  [14-38] 31 (11/16 0600) BP: (84-143)/(57-99) 136/76 (11/16 0600) SpO2:  [80 %-100 %] 91 % (11/16 0600) Weight:  [177 lb 14.6 oz (80.7 kg)-180 lb 4.8 oz (81.8 kg)] 180 lb 4.8 oz (81.8 kg) (11/16 0654) Last BM Date: 09/26/2016  Weight change: Filed Weights   09/29/2016 0958 10/19/2016 1559 10/11/16 0654  Weight: 180 lb (81.6 kg) 177 lb 14.6 oz (80.7 kg) 180 lb 4.8 oz (81.8 kg)    Intake/Output:   Intake/Output Summary (Last 24 hours) at 10/11/16 0713 Last data filed at 10/11/16 0655  Gross per 24 hour  Intake          1377.95 ml  Output              535 ml  Net           842.95 ml     Physical Exam: CVP 22 General:  Fatigued. No resp difficulty. In bed lying flat HEENT: normal Neck: supple. JVP to ear . Carotids 2+ bilat; no bruits. No lymphadenopathy or thryomegaly appreciated. Cor: PMI nondisplaced. Regular rate & rhythm. No rubs, or murmurs.+S3 Lungs: Crackles in the bases Abdomen: soft, nontender, nondistended. No hepatosplenomegaly. No bruits or masses. Good bowel sounds. Extremities: no cyanosis, clubbing, rash, edema. RUE PICC. R and LLE 2+ edema. Ted hose present.  Neuro: alert & orientedx3, cranial nerves grossly intact. moves all 4 extremities w/o difficulty. Affect pleasant  Telemetry: SR NSVT 90s   Labs: Basic Metabolic Panel:  Recent Labs Lab 10/15/2016 0957 10/24/2016 1835 10/11/16 0422  NA 138 137 135  K 2.8* 3.5 3.9  CL 94* 94* 93*  CO2 31 31 32  GLUCOSE 151* 133* 252*  BUN 18  22* 24*  CREATININE 1.28* 1.47* 1.67*  CALCIUM 8.6* 8.6* 8.3*  MG 1.8  --  1.5*    Liver Function Tests:  Recent Labs Lab 10/20/2016 0957 10/11/16 0422  AST 31 62*  ALT 16* 39  ALKPHOS 88 86  BILITOT 2.8* 2.8*  PROT 7.5 6.8  ALBUMIN 3.3* 3.1*   No results for input(s): LIPASE, AMYLASE in the last 168 hours. No results for input(s): AMMONIA in the last 168 hours.  CBC:  Recent Labs Lab 10/07/2016 0957  WBC 6.6  NEUTROABS 3.1  HGB 14.7  HCT 43.9  MCV 87.8  PLT 169    Cardiac Enzymes: No results for input(s): CKTOTAL, CKMB, CKMBINDEX, TROPONINI in the last 168 hours.  BNP: BNP (last 3 results)  Recent Labs  05/16/16 0420 06/21/16 1218 10/19/2016 0957  BNP 2,453.6* 2,504.3* 3,542.3*    ProBNP (last 3 results) No results for input(s): PROBNP in the last 8760 hours.    Other results:  Imaging: Ct Head Wo Contrast  Result Date: 10/22/2016 CLINICAL DATA:  Syncope. EXAM: CT HEAD WITHOUT CONTRAST TECHNIQUE: Contiguous axial images were obtained from the base of the skull through the vertex without intravenous contrast. COMPARISON:  CT scan of July 03, 2014. 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 No mass effect or midline shift is noted. Ventricular size is within normal limits. There is no evidence of mass lesion, hemorrhage or acute infarction. Vascular: Atherosclerosis of carotid siphons is noted. Skull: Bony calvarium is unremarkable. Sinuses/Orbits: Visualized paranasal sinuses appear normal. Other: None. IMPRESSION: Stable old infarctions as described above. No acute intracranial abnormality seen. Electronically Signed   By: Lupita Raider, M.D.   On: 10/24/2016 11:54   Dg Chest Port 1 View  Result Date: 10/23/2016 CLINICAL DATA:  PICC line placement EXAM: PORTABLE CHEST 1 VIEW COMPARISON:  09/28/2016 at 10:29 FINDINGS: There is a new right upper extremity PICC line extending into the low  SVC. There is marked unchanged cardiomegaly. No airspace consolidation. No large effusion. No pneumothorax. IMPRESSION: Satisfactorily positioned right upper extremity PICC line. No other significant interval changes. Electronically Signed   By: Ellery Plunk M.D.   On: 10/14/2016 21:01   Dg Chest Port 1 View  Result Date: 09/29/2016 CLINICAL DATA:  Syncope today, cough for 1 week EXAM: PORTABLE CHEST 1 VIEW COMPARISON:  05/13/2016 FINDINGS: There is mild bilateral interstitial thickening. There is no focal parenchymal opacity. There is no pleural effusion or pneumothorax. There is stable cardiomegaly. There is a dual lead AICD. The osseous structures are unremarkable. IMPRESSION: Cardiomegaly with mild pulmonary vascular congestion. Electronically Signed   By: Elige Ko   On: 10/04/2016 10:38      Medications:     Scheduled Medications: . digoxin  0.125 mg Oral Daily  . furosemide  80 mg Intravenous BID  . [START ON 09/30/2016] Influenza vac split quadrivalent PF  0.5 mL Intramuscular Tomorrow-1000  . magnesium oxide  400 mg Oral BID  . potassium chloride  40 mEq Oral BID  . sodium chloride flush  10-40 mL Intracatheter Q12H  . sodium chloride flush  3 mL Intravenous Q12H  . spironolactone  12.5 mg Oral Daily  . warfarin  7.5 mg Oral q1800  . Warfarin - Pharmacist Dosing Inpatient   Does not apply q1800     Infusions: . amiodarone 60 mg/hr (10/11/16 0649)  . milrinone 0.375 mcg/kg/min (10/11/16 0649)  . norepinephrine (LEVOPHED) Adult infusion 5 mcg/min (10/18/2016 2258)     PRN Medications:  sodium chloride, acetaminophen, ondansetron (ZOFRAN) IV, sodium chloride flush, sodium chloride flush, traMADol   Assessment/Plan/Discussion    1. Syncope - VT [employer]  Device interrogation showed multiple episodes of VT. Prior to admit he was supposed to be on amio daily. We called his pharmacy today and he last picked up prescription for amio in Apri 2017. Load amio  per EP. K repleted. Mag 1.5. Mag replaced. Check Mag this afternoon.  2. A/C Systolic Heart Failure-Cardiogenic Shock  NICM. ECHO EF 10%. With RV failure June 2017. Marland Kitchen BNP >3000. On admit he had marked volume overload with prominent S3. PICC placed with initial CO-OX  26%. Started milrinone and titrated up to 0.375 mcg+ norepi 5 mcg. Todays CO-OX is 62%. CVP 22. Give 5 mg metolazone now and 160 mg IV lasix twice daily.  Continue  0.125 mg digoxin and 12.5 mg spiro.  Check BMET and Mag at 1300.  Repeat ECHO. Follow renal function closely 3. PAF- starting amio drip. On coumadin. INR 4.54. Hold coumadin today.  Pharmacy to dose coumadin. 4. H/O LV thrombus 5. Acute on CKD III- watch renal function closely.  Creatinine trending up-->1.67 6. H/O ETOH  7. H/O tobacco abuse 8.  Hypokalemia- K 3.9  9. Hypomagnesium- Mag 1.5--> Today he was given 4 grams of mag.   Length of Stay: 1   Amy Clegg NP-C  10/11/2016, 7:13 AM  Advanced Heart Failure Team Pager [phone] (M-F; 7a - 4p)  Please contact [facility] for night-coverage after hours (4p -7a ) and weekends on [email or web address]  Patient seen and examined with Tonye BecketAmy Clegg, NP. We discussed all aspects of the encounter. I agree with the assessment and plan as stated above.   He remains critically ill with profound cardiogenic shock. Initial co-ox 25%. Output now improved on milrinone and norepi however has AKI in setting of ATN. Will continue inotrope support and tart high-dose lasix and metolazone. Hopefully urine output will improve. Has high burden of ventricular ectopy. Supping electrolytes. Lido added to [employer].  Hold coumadin in case of need for procedures including possible Trialysis catheter.   Has not been VAD candidate in past due to poor RV function and poor social support. May need to re-evaluate.   The patient is critically ill with multiple organ systems failure and requires high complexity decision making for assessment and support, frequent  evaluation and titration of therapies, application of advanced monitoring technologies and extensive interpretation of multiple databases.   Critical Care Time devoted to patient care services described in this note is 35 Minutes.  Bensimhon, Daniel,MD 9:04 AM

## 2016-10-11 NOTE — Progress Notes (Signed)
CRITICAL VALUE ALERT  Critical value received: INR 4.54  Date of notification:  10/11/2016  Time of notification:  0500  Critical value read back:Yes  Nurse who received alert:  Dorna Leitz, Rn  MD notified (1st page):  Dr. Anne Fu  Time of first page:  0515  Responding MD:  Dr. Anne Fu  Time MD responded:  [phone]

## 2016-10-11 NOTE — Progress Notes (Signed)
SUBJECTIVE: The patient remains critically ill although he is feeling better with ionotropes.  At this time, he denies chest pain, shortness of breath, or any new concerns.  He thinks he feels better today than yesterday.   CURRENT MEDICATIONS: . digoxin  0.125 mg Oral Daily  . furosemide  160 mg Intravenous BID  . [START ON 10/03/2016] Influenza vac split quadrivalent PF  0.5 mL Intramuscular Tomorrow-1000  . magnesium oxide  400 mg Oral BID  . potassium chloride  40 mEq Oral BID  . sodium chloride flush  10-40 mL Intracatheter Q12H  . sodium chloride flush  3 mL Intravenous Q12H  . spironolactone  12.5 mg Oral Daily  . Warfarin - Pharmacist Dosing Inpatient   Does not apply q1800   . lidocaine 2 mg/min (10/11/16 0826)  . milrinone 0.375 mcg/kg/min (10/11/16 0649)  . norepinephrine (LEVOPHED) Adult infusion 5 mcg/min (11/08/16 2258)    OBJECTIVE: Physical Exam: Vitals:   10/11/16 0530 10/11/16 0600 10/11/16 0654 10/11/16 0823  BP: (!) 142/75 136/76  108/85  Pulse: (!) 45 (!) 45  76  Resp: (!) 25 (!) 31  (!) 23  Temp:    97.4 F (36.3 C)  TempSrc:    Oral  SpO2: 94% 91%    Weight:   180 lb 4.8 oz (81.8 kg)   Height:        Intake/Output Summary (Last 24 hours) at 10/11/16 0906 Last data filed at 10/11/16 0655  Gross per 24 hour  Intake          1377.95 ml  Output              535 ml  Net           842.95 ml    Telemetry reveals frequent ventricular ectopy, sustained ventricular tachycardia (rate 90's)  GEN- The patient is well appearing, alert and oriented x 3 today.   Head- normocephalic, atraumatic Eyes-  Sclera clear, conjunctiva pink Ears- hearing intact Oropharynx- clear Neck- supple  Lungs- Clear to ausculation bilaterally, normal work of breathing Heart- Regular rate and rhythm, no murmurs, rubs or gallops GI- soft, NT, ND, + BS Extremities- no clubbing, cyanosis, 2+ BLE edema Skin- no rash or lesion Psych- euthymic mood, full affect Neuro-  strength and sensation are intact  LABS: Basic Metabolic Panel:  Recent Labs  43/56/86 0957 2016-11-08 1835 10/11/16 0422  NA 138 137 135  K 2.8* 3.5 3.9  CL 94* 94* 93*  CO2 31 31 32  GLUCOSE 151* 133* 252*  BUN 18 22* 24*  CREATININE 1.28* 1.47* 1.67*  CALCIUM 8.6* 8.6* 8.3*  MG 1.8  --  1.5*   Liver Function Tests:  Recent Labs  11/08/2016 0957 10/11/16 0422  AST 31 62*  ALT 16* 39  ALKPHOS 88 86  BILITOT 2.8* 2.8*  PROT 7.5 6.8  ALBUMIN 3.3* 3.1*   CBC:  Recent Labs  11-08-16 0957  WBC 6.6  NEUTROABS 3.1  HGB 14.7  HCT 43.9  MCV 87.8  PLT 169   Thyroid Function Tests:  Recent Labs  10/11/16 0630  TSH 2.444    RADIOLOGY: Ct Head Wo Contrast Result Date: 11/08/16 CLINICAL DATA:  Syncope. EXAM: CT HEAD WITHOUT CONTRAST TECHNIQUE: Contiguous axial images were obtained from the base of the skull through the vertex without intravenous contrast. COMPARISON:  CT scan of July 03, 2014. 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 No mass effect  or midline shift is noted. Ventricular size is within normal limits. There is no evidence of mass lesion, hemorrhage or acute infarction. Vascular: Atherosclerosis of carotid siphons is noted. Skull: Bony calvarium is unremarkable. Sinuses/Orbits: Visualized paranasal sinuses appear normal. Other: None. IMPRESSION: Stable old infarctions as described above. No acute intracranial abnormality seen. Electronically Signed   By: Lupita RaiderJames  Green Jr, M.D.   On: January 24, 2016 11:54   Dg Chest Port 1 View Result Date: January 24, 2016 CLINICAL DATA:  PICC line placement EXAM: PORTABLE CHEST 1 VIEW COMPARISON:  January 24, 2016 at 10:29 FINDINGS: There is a new right upper extremity PICC line extending into the low SVC. There is marked unchanged cardiomegaly. No airspace consolidation. No large effusion. No pneumothorax. IMPRESSION: Satisfactorily positioned right upper extremity PICC  line. No other significant interval changes. Electronically Signed   By: Ellery Plunkaniel R Mitchell M.D.   On: January 24, 2016 21:01   ASSESSMENT AND PLAN:  Active Problems:   Ventricular fibrillation (HCC)  1.  Ventricular tachycardia The patient has persistent ventricular tachycardia this morning despite IV amiodarone. Will add Lidocaine to try and suppress.   Keep K >3.9, Mg >1.8 For now, will not reprogram device detection as he is tolerating VT hemodynamically and would like to avoid shocks (ICD interrogation yesterday with no success with multiple spins of ATP)  2.  Cardiogenic shock Currently on pressor support Management per AHF team  3.  CKD, stage III Renal function worse this morning  4.  Paroxysmal atrial fibrillation Currently maintaining SR Continue OAC long term for CHADS2VASC of 2 Hold for now in case procedures needed  Gypsy BalsamAmber Seiler, NP 10/11/2016 9:17 AM  EP Attending  Patient seen and examined. Despite feeling better, he remains critically ill. He has long runs of non-sustatined VT. He has been treated with IV amio and had additional IV lidocaine added this morning. Note plans to start high dose diuretic therapy. He is at high risk for lidocaine toxicity. Will watch for side effects.  Leonia ReevesGregg Johathon Overturf,M.D.

## 2016-10-11 NOTE — Progress Notes (Signed)
Results for JAYVEION, SKOGSTAD (MRN [id number]) as of 10/11/2016 13:55  Ref. Range 10/11/2016 04:22 10/11/2016 04:24 10/11/2016 06:30 10/11/2016 12:10  Glucose Latest Ref Range: 65 - 99 mg/dL 007 (H)   121 (H)  Noted that lab glucose results have been greater than 180 mg/dl. No history of diabetes noted. Recommend checking HgbA1C for general blood glucose control. Check CBGs TID & HS.  Recommend starting Novolog MODERATE correction scale TID & HS.  May need to add low dose basal insulin Lantus 10 units daily if blood sugars continue to be elevated.  Medel Mince RN BSN CDE

## 2016-10-11 NOTE — Significant Event (Signed)
I was contacted by the nurse regarding blood glucose levels being elevated.  Started patient on sliding scale insulin.  Burton Apley, MD

## 2016-10-11 NOTE — Progress Notes (Signed)
Magnesium level 1.5, notified Dr. Anne Fu via text page. He called back immediately and ordered Magnesium 2g IV X 1 dose. Will administer after pharmacy verifies it.

## 2016-10-12 ENCOUNTER — Encounter (HOSPITAL_COMMUNITY): Admission: EM | Disposition: E | Payer: Self-pay | Source: Home / Self Care | Attending: Internal Medicine

## 2016-10-12 ENCOUNTER — Ambulatory Visit (HOSPITAL_COMMUNITY): Payer: Medicaid Other

## 2016-10-12 ENCOUNTER — Encounter (HOSPITAL_COMMUNITY): Payer: Self-pay | Admitting: Internal Medicine

## 2016-10-12 ENCOUNTER — Inpatient Hospital Stay (HOSPITAL_COMMUNITY): Payer: Medicaid Other

## 2016-10-12 DIAGNOSIS — I509 Heart failure, unspecified: Secondary | ICD-10-CM

## 2016-10-12 DIAGNOSIS — N179 Acute kidney failure, unspecified: Secondary | ICD-10-CM

## 2016-10-12 DIAGNOSIS — R570 Cardiogenic shock: Secondary | ICD-10-CM

## 2016-10-12 HISTORY — PX: INSERTION OF DIALYSIS CATHETER: SHX1324

## 2016-10-12 HISTORY — PX: CARDIAC CATHETERIZATION: SHX172

## 2016-10-12 LAB — CBC
HEMATOCRIT: 36.7 % — AB (ref 39.0–52.0)
Hemoglobin: 12.8 g/dL — ABNORMAL LOW (ref 13.0–17.0)
MCH: 29.4 pg (ref 26.0–34.0)
MCHC: 34.9 g/dL (ref 30.0–36.0)
MCV: 84.4 fL (ref 78.0–100.0)
Platelets: 138 10*3/uL — ABNORMAL LOW (ref 150–400)
RBC: 4.35 MIL/uL (ref 4.22–5.81)
RDW: 20.5 % — AB (ref 11.5–15.5)
WBC: 5.7 10*3/uL (ref 4.0–10.5)

## 2016-10-12 LAB — MAGNESIUM: MAGNESIUM: 2.7 mg/dL — AB (ref 1.7–2.4)

## 2016-10-12 LAB — POCT I-STAT 3, VENOUS BLOOD GAS (G3P V)
ACID-BASE EXCESS: 3 mmol/L — AB (ref 0.0–2.0)
Acid-Base Excess: 3 mmol/L — ABNORMAL HIGH (ref 0.0–2.0)
BICARBONATE: 28.9 mmol/L — AB (ref 20.0–28.0)
Bicarbonate: 29.2 mmol/L — ABNORMAL HIGH (ref 20.0–28.0)
O2 Saturation: 53 %
O2 Saturation: 55 %
PCO2 VEN: 50.1 mmHg (ref 44.0–60.0)
PH VEN: 7.369 (ref 7.250–7.430)
PH VEN: 7.37 (ref 7.250–7.430)
PO2 VEN: 29 mmHg — AB (ref 32.0–45.0)
PO2 VEN: 30 mmHg — AB (ref 32.0–45.0)
TCO2: 30 mmol/L (ref 0–100)
TCO2: 31 mmol/L (ref 0–100)
pCO2, Ven: 50.5 mmHg (ref 44.0–60.0)

## 2016-10-12 LAB — BLOOD GAS, ARTERIAL
ACID-BASE EXCESS: 2.3 mmol/L — AB (ref 0.0–2.0)
BICARBONATE: 26 mmol/L (ref 20.0–28.0)
DRAWN BY: 358491
O2 CONTENT: 4 L/min
O2 SAT: 97.3 %
PATIENT TEMPERATURE: 98.6
pCO2 arterial: 38 mmHg (ref 32.0–48.0)
pH, Arterial: 7.451 — ABNORMAL HIGH (ref 7.350–7.450)
pO2, Arterial: 100 mmHg (ref 83.0–108.0)

## 2016-10-12 LAB — HEPATIC FUNCTION PANEL
ALBUMIN: 3.3 g/dL — AB (ref 3.5–5.0)
ALT: 184 U/L — ABNORMAL HIGH (ref 17–63)
AST: 287 U/L — ABNORMAL HIGH (ref 15–41)
Alkaline Phosphatase: 82 U/L (ref 38–126)
BILIRUBIN DIRECT: 1.2 mg/dL — AB (ref 0.1–0.5)
BILIRUBIN TOTAL: 2.5 mg/dL — AB (ref 0.3–1.2)
Indirect Bilirubin: 1.3 mg/dL — ABNORMAL HIGH (ref 0.3–0.9)
Total Protein: 7.4 g/dL (ref 6.5–8.1)

## 2016-10-12 LAB — PROTIME-INR
INR: 6.86
INR: 3.66
INR: 3.32
PROTHROMBIN TIME: 61.6 s — AB (ref 11.4–15.2)
PROTHROMBIN TIME: 34.5 s — AB (ref 11.4–15.2)
Prothrombin Time: 37.3 seconds — ABNORMAL HIGH (ref 11.4–15.2)

## 2016-10-12 LAB — BASIC METABOLIC PANEL
ANION GAP: 17 — AB (ref 5–15)
BUN: 34 mg/dL — ABNORMAL HIGH (ref 6–20)
CALCIUM: 8.8 mg/dL — AB (ref 8.9–10.3)
CO2: 25 mmol/L (ref 22–32)
Chloride: 87 mmol/L — ABNORMAL LOW (ref 101–111)
Creatinine, Ser: 2.9 mg/dL — ABNORMAL HIGH (ref 0.61–1.24)
GFR, EST AFRICAN AMERICAN: 28 mL/min — AB (ref >60)
GFR, EST NON AFRICAN AMERICAN: 24 mL/min — AB (ref >60)
GLUCOSE: 147 mg/dL — AB (ref 65–99)
POTASSIUM: 4.9 mmol/L (ref 3.5–5.1)
SODIUM: 129 mmol/L — AB (ref 135–145)

## 2016-10-12 LAB — COOXEMETRY PANEL
Carboxyhemoglobin: 1.4 % (ref 0.5–1.5)
Carboxyhemoglobin: 1.2 % (ref 0.5–1.5)
Methemoglobin: 0.7 % (ref 0.0–1.5)
Methemoglobin: 0.7 % (ref 0.0–1.5)
O2 Saturation: 76.4 %
O2 Saturation: 58.1 %
TOTAL HEMOGLOBIN: 13.9 g/dL (ref 12.0–16.0)
Total hemoglobin: 12.8 g/dL (ref 12.0–16.0)

## 2016-10-12 LAB — RENAL FUNCTION PANEL
ANION GAP: 13 (ref 5–15)
Albumin: 3.2 g/dL — ABNORMAL LOW (ref 3.5–5.0)
BUN: 40 mg/dL — ABNORMAL HIGH (ref 6–20)
CHLORIDE: 86 mmol/L — AB (ref 101–111)
CO2: 28 mmol/L (ref 22–32)
Calcium: 8.3 mg/dL — ABNORMAL LOW (ref 8.9–10.3)
Creatinine, Ser: 3.23 mg/dL — ABNORMAL HIGH (ref 0.61–1.24)
GFR, EST AFRICAN AMERICAN: 24 mL/min — AB (ref >60)
GFR, EST NON AFRICAN AMERICAN: 21 mL/min — AB (ref >60)
Glucose, Bld: 148 mg/dL — ABNORMAL HIGH (ref 65–99)
POTASSIUM: 4.7 mmol/L (ref 3.5–5.1)
Phosphorus: 4.6 mg/dL (ref 2.5–4.6)
Sodium: 127 mmol/L — ABNORMAL LOW (ref 135–145)

## 2016-10-12 LAB — POCT I-STAT 3, ART BLOOD GAS (G3+)
Acid-Base Excess: 1 mmol/L (ref 0.0–2.0)
Bicarbonate: 25.1 mmol/L (ref 20.0–28.0)
O2 Saturation: 96 %
PCO2 ART: 39.6 mmHg (ref 32.0–48.0)
PH ART: 7.411 (ref 7.350–7.450)
PO2 ART: 81 mmHg — AB (ref 83.0–108.0)
TCO2: 26 mmol/L (ref 0–100)

## 2016-10-12 LAB — GLUCOSE, CAPILLARY
GLUCOSE-CAPILLARY: 186 mg/dL — AB (ref 65–99)
GLUCOSE-CAPILLARY: 158 mg/dL — AB (ref 65–99)
GLUCOSE-CAPILLARY: 145 mg/dL — AB (ref 65–99)
Glucose-Capillary: 150 mg/dL — ABNORMAL HIGH (ref 65–99)

## 2016-10-12 LAB — TYPE AND SCREEN
ABO/RH(D): B POS
Antibody Screen: NEGATIVE

## 2016-10-12 LAB — LACTIC ACID, PLASMA
LACTIC ACID, VENOUS: 4.8 mmol/L — AB (ref 0.5–1.9)
LACTIC ACID, VENOUS: 2.1 mmol/L — AB (ref 0.5–1.9)
Lactic Acid, Venous: 1.9 mmol/L (ref 0.5–1.9)

## 2016-10-12 LAB — ECHOCARDIOGRAM COMPLETE
HEIGHTINCHES: 67 in
WEIGHTICAEL: 3019.42 [oz_av]

## 2016-10-12 LAB — ABO/RH: ABO/RH(D): B POS

## 2016-10-12 SURGERY — RIGHT HEART CATH
Anesthesia: LOCAL

## 2016-10-12 MED ORDER — MIDAZOLAM HCL 2 MG/2ML IJ SOLN
INTRAMUSCULAR | Status: AC
  Filled 2016-10-12: qty 2

## 2016-10-12 MED ORDER — ASPIRIN 81 MG PO CHEW: 81.0000 mg | CHEWABLE_TABLET | ORAL | Status: DC

## 2016-10-12 MED ORDER — FENTANYL CITRATE (PF) 100 MCG/2ML IJ SOLN
INTRAMUSCULAR | Status: AC
  Filled 2016-10-12: qty 2

## 2016-10-12 MED ORDER — DOBUTAMINE IN D5W 4-5 MG/ML-% IV SOLN
7.0000 ug/kg/min | INTRAVENOUS | Status: DC
  Administered 2016-10-12: 2.5 ug/kg/min via INTRAVENOUS
  Administered 2016-10-13 – 2016-10-15 (×2): 5 ug/kg/min via INTRAVENOUS
  Filled 2016-10-12 (×3): qty 250

## 2016-10-12 MED ORDER — PRISMASOL BGK 4/2.5 32-4-2.5 MEQ/L IV SOLN
INTRAVENOUS | Status: DC
  Administered 2016-10-12 – 2016-10-13 (×2): via INTRAVENOUS_CENTRAL
  Filled 2016-10-12 (×3): qty 5000

## 2016-10-12 MED ORDER — SODIUM CHLORIDE 0.9% FLUSH: 3.0000 mL | INTRAVENOUS | Status: DC | PRN

## 2016-10-12 MED ORDER — SODIUM CHLORIDE 0.9 % IV SOLN: 250.0000 mL | INTRAVENOUS | Status: DC | PRN

## 2016-10-12 MED ORDER — SODIUM CHLORIDE 0.9% FLUSH
3.0000 mL | Freq: Two times a day (BID) | INTRAVENOUS | Status: DC
  Administered 2016-10-12: 3 mL via INTRAVENOUS

## 2016-10-12 MED ORDER — HEPARIN (PORCINE) IN NACL 100-0.45 UNIT/ML-% IJ SOLN
1550.0000 [IU]/h | INTRAMUSCULAR | Status: DC
  Administered 2016-10-12: 500 [IU]/h via INTRAVENOUS
  Administered 2016-10-13 – 2016-10-15 (×3): 1250 [IU]/h via INTRAVENOUS
  Administered 2016-10-16: 1400 [IU]/h via INTRAVENOUS
  Administered 2016-10-17 – 2016-10-19 (×4): 1550 [IU]/h via INTRAVENOUS
  Filled 2016-10-12 (×9): qty 250

## 2016-10-12 MED ORDER — LIDOCAINE HCL (PF) 1 % IJ SOLN
INTRAMUSCULAR | Status: DC | PRN
  Administered 2016-10-12: 25 mL via INTRADERMAL

## 2016-10-12 MED ORDER — HEPARIN SODIUM (PORCINE) 1000 UNIT/ML IJ SOLN
INTRAMUSCULAR | Status: DC | PRN
  Administered 2016-10-12: 2000 [IU] via INTRAVENOUS

## 2016-10-12 MED ORDER — MIDAZOLAM HCL 2 MG/2ML IJ SOLN
INTRAMUSCULAR | Status: DC | PRN
  Administered 2016-10-12: 1 mg via INTRAVENOUS

## 2016-10-12 MED ORDER — PERFLUTREN LIPID MICROSPHERE
INTRAVENOUS | Status: AC
  Filled 2016-10-12: qty 10

## 2016-10-12 MED ORDER — SODIUM CHLORIDE 0.9% FLUSH
3.0000 mL | Freq: Two times a day (BID) | INTRAVENOUS | Status: DC
  Administered 2016-10-12 – 2016-10-15 (×6): 3 mL via INTRAVENOUS
  Administered 2016-10-15: 10 mL via INTRAVENOUS
  Administered 2016-10-16 – 2016-10-20 (×7): 3 mL via INTRAVENOUS

## 2016-10-12 MED ORDER — HEPARIN (PORCINE) IN NACL 2-0.9 UNIT/ML-% IJ SOLN
INTRAMUSCULAR | Status: DC | PRN
  Administered 2016-10-12: 500 mL via INTRA_ARTERIAL

## 2016-10-12 MED ORDER — ONDANSETRON HCL 4 MG/2ML IJ SOLN
4.0000 mg | Freq: Four times a day (QID) | INTRAMUSCULAR | Status: DC | PRN
  Administered 2016-10-25 – 2016-10-27 (×2): 4 mg via INTRAVENOUS
  Filled 2016-10-12 (×3): qty 2

## 2016-10-12 MED ORDER — LIDOCAINE HCL (PF) 1 % IJ SOLN
INTRAMUSCULAR | Status: AC
  Filled 2016-10-12: qty 30

## 2016-10-12 MED ORDER — IOPAMIDOL (ISOVUE-370) INJECTION 76%
INTRAVENOUS | Status: AC
Start: 2016-10-12 — End: 2016-10-12
  Filled 2016-10-12: qty 50

## 2016-10-12 MED ORDER — VITAMIN K1 10 MG/ML IJ SOLN
5.0000 mg | Freq: Once | INTRAVENOUS | Status: AC
  Administered 2016-10-12: 5 mg via INTRAVENOUS
  Filled 2016-10-12: qty 0.5

## 2016-10-12 MED ORDER — HEPARIN SODIUM (PORCINE) 1000 UNIT/ML DIALYSIS
1000.0000 [IU] | INTRAMUSCULAR | Status: DC | PRN
  Filled 2016-10-12: qty 6

## 2016-10-12 MED ORDER — SODIUM CHLORIDE 0.9 % WEIGHT BASED INFUSION: 1.0000 mL/kg/h | INTRAVENOUS | Status: DC

## 2016-10-12 MED ORDER — FENTANYL CITRATE (PF) 100 MCG/2ML IJ SOLN
INTRAMUSCULAR | Status: DC | PRN
  Administered 2016-10-12: 25 ug via INTRAVENOUS

## 2016-10-12 MED ORDER — HEPARIN SODIUM (PORCINE) 1000 UNIT/ML IJ SOLN
INTRAMUSCULAR | Status: AC
Start: 2016-10-12 — End: 2016-10-12
  Filled 2016-10-12: qty 1

## 2016-10-12 MED ORDER — PRISMASOL BGK 4/2.5 32-4-2.5 MEQ/L IV SOLN
INTRAVENOUS | Status: DC
  Administered 2016-10-12 – 2016-10-22 (×72): via INTRAVENOUS_CENTRAL
  Filled 2016-10-12 (×97): qty 5000

## 2016-10-12 MED ORDER — SODIUM CHLORIDE 0.9 % FOR CRRT
INTRAVENOUS_CENTRAL | Status: DC | PRN
  Filled 2016-10-12: qty 1000

## 2016-10-12 MED ORDER — SODIUM CHLORIDE 0.9 % WEIGHT BASED INFUSION: 3.0000 mL/kg/h | INTRAVENOUS | Status: DC

## 2016-10-12 MED ORDER — IOPAMIDOL (ISOVUE-370) INJECTION 76%
INTRAVENOUS | Status: DC | PRN
  Administered 2016-10-12: 10 mL via INTRA_ARTERIAL

## 2016-10-12 MED ORDER — NOREPINEPHRINE BITARTRATE 1 MG/ML IV SOLN
0.0000 ug/min | INTRAVENOUS | Status: DC
  Administered 2016-10-12 (×2): 40 ug/min via INTRAVENOUS
  Administered 2016-10-13: 38 ug/min via INTRAVENOUS
  Administered 2016-10-13 (×2): 40 ug/min via INTRAVENOUS
  Administered 2016-10-14 (×2): 55 ug/min via INTRAVENOUS
  Administered 2016-10-14: 40 ug/min via INTRAVENOUS
  Administered 2016-10-14: 50 ug/min via INTRAVENOUS
  Administered 2016-10-15: 49 ug/min via INTRAVENOUS
  Administered 2016-10-15: 55 ug/min via INTRAVENOUS
  Administered 2016-10-16: 45 ug/min via INTRAVENOUS
  Administered 2016-10-16: 46 ug/min via INTRAVENOUS
  Administered 2016-10-16 – 2016-10-17 (×2): 45 ug/min via INTRAVENOUS
  Administered 2016-10-17 – 2016-10-19 (×6): 35 ug/min via INTRAVENOUS
  Administered 2016-10-19 – 2016-10-20 (×2): 25 ug/min via INTRAVENOUS
  Administered 2016-10-20: 18 ug/min via INTRAVENOUS
  Administered 2016-10-21 – 2016-10-23 (×4): 19 ug/min via INTRAVENOUS
  Administered 2016-10-24: 12 ug/min via INTRAVENOUS
  Administered 2016-10-25: 14 ug/min via INTRAVENOUS
  Administered 2016-10-26: 18 ug/min via INTRAVENOUS
  Administered 2016-10-26 – 2016-10-29 (×5): 16 ug/min via INTRAVENOUS
  Filled 2016-10-12 (×40): qty 16

## 2016-10-12 MED ORDER — PRISMASOL BGK 4/2.5 32-4-2.5 MEQ/L IV SOLN
INTRAVENOUS | Status: DC
  Administered 2016-10-12 – 2016-10-14 (×3): via INTRAVENOUS_CENTRAL
  Filled 2016-10-12 (×4): qty 5000

## 2016-10-12 MED ORDER — SODIUM CHLORIDE 0.9 % IV SOLN
Freq: Once | INTRAVENOUS | Status: AC
  Administered 2016-10-12: 08:00:00 via INTRAVENOUS

## 2016-10-12 MED ORDER — HEPARIN (PORCINE) IN NACL 2-0.9 UNIT/ML-% IJ SOLN
INTRAMUSCULAR | Status: AC
  Filled 2016-10-12: qty 500

## 2016-10-12 MED ORDER — ACETAMINOPHEN 325 MG PO TABS: 650.0000 mg | ORAL_TABLET | ORAL | Status: DC | PRN

## 2016-10-12 SURGICAL SUPPLY — 19 items
BALLN LINEAR 7.5FR IABP 40CC (BALLOONS) ×3
BALLOON LINEAR 7.5FR IABP 40CC (BALLOONS) ×2 IMPLANT
CATH PALINDROME RT-P 15FX23CM (CATHETERS) ×3 IMPLANT
CATH SWAN GANZ VIP 7.5F (CATHETERS) ×3 IMPLANT
COVER PRB 48X5XTLSCP FOLD TPE (BAG) ×2 IMPLANT
COVER PROBE 5X48 (BAG) ×1
DEVICE SECURE STATLOCK IABP (MISCELLANEOUS) ×6 IMPLANT
NEEDLE PERC 21GX4CM (NEEDLE) ×3 IMPLANT
PACK CARDIAC CATHETERIZATION (CUSTOM PROCEDURE TRAY) ×3 IMPLANT
PROTECTION STATION PRESSURIZED (MISCELLANEOUS) ×3
SHEATH PINNACLE 5F 10CM (SHEATH) ×3 IMPLANT
SHEATH PINNACLE 7F 10CM (SHEATH) ×3 IMPLANT
SHEATH PINNACLE 8F 10CM (SHEATH) ×3 IMPLANT
SLEEVE REPOSITIONING LENGTH 30 (MISCELLANEOUS) ×3 IMPLANT
STATION PROTECTION PRESSURIZED (MISCELLANEOUS) ×2 IMPLANT
TRANSDUCER W/STOPCOCK (MISCELLANEOUS) ×3 IMPLANT
WIRE EMERALD 3MM-J .025X260CM (WIRE) ×3 IMPLANT
WIRE EMERALD 3MM-J .035X150CM (WIRE) ×3 IMPLANT
WIRE MICROINTRODUCER 60CM (WIRE) ×6 IMPLANT

## 2016-10-12 NOTE — Progress Notes (Signed)
   Patient now s/p IABP and trialysis catheter placement. Remains anuric.  Echo reviewed personally. EF5-10 with severe RV dysfunction.   CVVHD being initiated now.   Will stop milrinone. Increasing dobutamine to 5. Norepi at 20 with augmented MAPs 80-90s.   Respiratory status stable.   Will repeat lactic acid now.   Prognosis remains guarded.  Additional CC time 30 mins.   Bensimhon, Daniel,MD 4:28 PM

## 2016-10-12 NOTE — Progress Notes (Signed)
SUBJECTIVE: The patient has increased shortness of breath and is confused this morning.  He says it is difficult to think clearly.  Lactate climbing, creatinine up today, INR 6.86 despite Vit K last night  Still not urinating  CURRENT MEDICATIONS: . furosemide  160 mg Intravenous Q6H  . Influenza vac split quadrivalent PF  0.5 mL Intramuscular Tomorrow-1000  . insulin aspart  0-5 Units Subcutaneous QHS  . insulin aspart  0-9 Units Subcutaneous TID WC  . magnesium oxide  400 mg Oral BID  . sodium chloride flush  10-40 mL Intracatheter Q12H  . sodium chloride flush  3 mL Intravenous Q12H   . amiodarone 30 mg/hr (2016-10-13 0400)  . DOBUTamine 2.5 mcg/kg/min (13-Oct-2016 0556)  . lidocaine 1 mg/min (10-13-16 8527)  . milrinone 0.375 mcg/kg/min (October 13, 2016 0444)  . norepinephrine (LEVOPHED) Adult infusion 5.013 mcg/min (2016/10/13 0200)    OBJECTIVE: Physical Exam: Vitals:   Oct 13, 2016 0400 2016/10/13 0434 13-Oct-2016 0500 October 13, 2016 0600  BP: 96/70  91/65 97/73  Pulse: 88  86 87  Resp: (!) 30  (!) 26 (!) 28  Temp: 98.2 F (36.8 C)     TempSrc: Oral     SpO2: 96%  95% 96%  Weight:  188 lb 11.4 oz (85.6 kg)    Height:        Intake/Output Summary (Last 24 hours) at 10/13/2016 7824 Last data filed at 13-Oct-2016 0600  Gross per 24 hour  Intake          2715.01 ml  Output              151 ml  Net          2564.01 ml    Telemetry reveals sinus rhythm, significant improvement in ventricular ectopy   GEN- The patient is ill appearing, alert but with confusion this morning   Head- normocephalic, atraumatic Eyes-  Sclera clear, conjunctiva pink Ears- hearing intact Oropharynx- clear Neck- supple  Lungs- Increased work of breathing, clear to ausculation bilaterally  Heart- Regular rate and rhythm  GI- soft, NT, ND, + BS Extremities- no clubbing, cyanosis, 2+ BLE edema, +compression hose  Skin- no rash or lesion Psych- confused  Neuro- strength and sensation are intact  LABS: Basic  Metabolic Panel:  Recent Labs  23/53/61 1210 10-13-16 0442  NA 129* 129*  K 3.7 4.9  CL 88* 87*  CO2 28 25  GLUCOSE 355* 147*  BUN 25* 34*  CREATININE 1.81* 2.90*  CALCIUM 8.4* 8.8*  MG 2.7* 2.7*   Liver Function Tests:  Recent Labs  10/24/2016 0957 10/11/16 0422  AST 31 62*  ALT 16* 39  ALKPHOS 88 86  BILITOT 2.8* 2.8*  PROT 7.5 6.8  ALBUMIN 3.3* 3.1*   CBC:  Recent Labs  10/23/2016 0957  WBC 6.6  NEUTROABS 3.1  HGB 14.7  HCT 43.9  MCV 87.8  PLT 169   Thyroid Function Tests:  Recent Labs  10/11/16 0630  TSH 2.444    RADIOLOGY: Ct Head Wo Contrast Result Date: 10/14/2016 CLINICAL DATA:  Syncope. EXAM: CT HEAD WITHOUT CONTRAST TECHNIQUE: Contiguous axial images were obtained from the base of the skull through the vertex without intravenous contrast. COMPARISON:  CT scan of July 03, 2014. 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 No mass effect or midline shift is noted. Ventricular size is within normal limits. There is no evidence of mass lesion, hemorrhage or acute infarction. Vascular: Atherosclerosis of carotid  siphons is noted. Skull: Bony calvarium is unremarkable. Sinuses/Orbits: Visualized paranasal sinuses appear normal. Other: None. IMPRESSION: Stable old infarctions as described above. No acute intracranial abnormality seen. Electronically Signed   By: Lupita RaiderJames  Green Jr, M.D.   On: 10/01/2016 11:54   Dg Chest Port 1 View Result Date: 10/17/2016 CLINICAL DATA:  PICC line placement EXAM: PORTABLE CHEST 1 VIEW COMPARISON:  10/20/2016 at 10:29 FINDINGS: There is a new right upper extremity PICC line extending into the low SVC. There is marked unchanged cardiomegaly. No airspace consolidation. No large effusion. No pneumothorax. IMPRESSION: Satisfactorily positioned right upper extremity PICC line. No other significant interval changes. Electronically Signed   By: Ellery Plunkaniel R Mitchell M.D.   On:  10/14/2016 21:01   ASSESSMENT AND PLAN:  Active Problems:   Ventricular fibrillation (HCC)  1.  Ventricular tachycardia Ventricular ectopy significantly improved on IV amiodarone and lidocaine. He has some confusion this morning. Lidocaine dose decreased this morning with low threshold to discontinue.  Will discuss with Dr Graciela HusbandsKlein.   Keep K >3.9, Mg >1.8  2.  Cardiogenic shock Currently on pressor support Management per AHF team Lactate up, creat up  3.  Anuric renal failure  4.  Paroxysmal atrial fibrillation Currently maintaining SR Continue OAC long term for CHADS2VASC of 2  5.  Elevated INR INR 6.86 this morning despite Vit K last night. I have notified AHF team.   6.delerium Dr Graciela HusbandsKlein to see this morning.   Gypsy BalsamAmber Seiler, NP 10/21/2016 6:25 AM     Pt seen and examined and discussed with Dr DM and AC-NP For cath and hemodynamic support.  I would favor aggressive measures until we have reasonable certainty that his kidneys will NOT recover Have d/c lido as potentially neurotoxic and increased Amio  CHF pre team

## 2016-10-12 NOTE — Progress Notes (Signed)
CRITICAL VALUE ALERT  Critical value received:  INR  Date of notification:  10-16-16  Time of notification:  0600  Critical value read back:Yes.    Nurse who received alert:  Delice Bison   MD notified (1st page):  NP Glory Buff   Time of first page:  At bedside  MD notified (2nd page):  Time of second page:  Responding MD:  NP Glory Buff  Time MD responded:  At bedside [phone]

## 2016-10-12 NOTE — Consult Note (Signed)
[facility] Consult Note     Date: 09/28/2016                  Patient Name:  Parker Johnson  MRN: [id number]  DOB: March 11, 1966  Age / Sex: 50 y.o., male         PCP: Charolette Forward, MD                 Service Requesting Consult: Heart Failure- Dr. Jeffie Pollock                 Reason for Consult: Acute oliguric kidney injury            Chief Complaint: syncope and ICD shocks.  Seen at 0730 am.  HPI: Pt is a 29M with a PMH sig for NICM with EF 10% (04/2016), ICD, h/o LV thrombus, HTN, and atrial fibrillation who is now seen at the request of Dr. Jerilee Field for acute oliguric kidney injury.    He was admitted on 11/15 for syncope and AICD shocks.  Had some Vtach.   On amiodarone + lido gtt, lido d/c'd this AM for concern of neurotoxicity.  AKI with anuria and no response to IV lasix in setting of cardiogenic shock and hypotension.  On pressors.  Co-ox initially 20s--> 70s now.  Has elevated CVP, congestive hepatopathy, coagulopathy.  Critically ill.  Going to cath lab for [facility], IABP, and HD cath.    Getting FFP and Vit K for procedures.  AAO x 3.  Past Medical History:  Diagnosis Date  . AICD (automatic cardioverter/defibrillator) present    [employer] 100  . Alcohol abuse    hx  . Cardiomyopathy    secondary  . CHF (congestive heart failure) ([place])   . Chronic anticoagulation    Coumadin for hx LV thrombus and PAF  . Gout   . HTN (hypertension)    uncontrolled  . Tobacco abuse   . Ventricular tachycardia [facility])     Past Surgical History:  Procedure Laterality Date  . CARDIAC DEFIBRILLATOR PLACEMENT  2010   [employer] 100  . EMBOLECTOMY Right 06/04/2013   Procedure: Upper Extremity Thrombectomy;  Surgeon: Serafina Mitchell, MD;  Location: [facility] OR;  Service: Vascular;  Laterality: Right;    Family History  Problem Relation Age of Onset  . Cardiomyopathy Father     on a defib  . Diabetes Father    Social History:  reports  that he has been smoking Cigarettes.  He has a 15.00 pack-year smoking history. He has never used smokeless tobacco. He reports that he drinks about 0.6 oz of alcohol per week . He reports that he does not use drugs.  Allergies: No Known Allergies  Medications Prior to Admission  Medication Sig Dispense Refill  . allopurinol (ZYLOPRIM) 100 MG tablet Take 100 mg by mouth daily.    . digoxin (LANOXIN) 0.25 MG tablet Take 0.25 mg by mouth daily.     . potassium chloride SA (K-DUR,KLOR-CON) 20 MEQ tablet Take 2 tablets (40 mEq total) by mouth daily. 30 tablet 3  . spironolactone (ALDACTONE) 25 MG tablet Take 1 tablet (25 mg total) by mouth daily. 30 tablet 3  . torsemide (DEMADEX) 100 MG tablet Take 100 mg by mouth 2 (two) times daily.    Marland Kitchen warfarin (COUMADIN) 7.5 MG tablet Take 1 tablet (7.5 mg total) by mouth daily at 6 PM. (Patient taking differently: Take 3.75-7.5 mg by mouth daily at 6 PM. Patient  takes 7.5 mg everyday except on Sunday patient takes 3.75 mg) 35 tablet 3  . amiodarone (PACERONE) 200 MG tablet Take 1 tablet (200 mg total) by mouth daily. (Patient not taking: Reported on 10/20/2016) 30 tablet 3  . nicotine (NICODERM CQ - DOSED IN MG/24 HOURS) 14 mg/24hr patch Place 1 patch (14 mg total) onto the skin daily. (Patient not taking: Reported on 10/19/2016) 28 patch 0    Results for orders placed or performed during the hospital encounter of 10/09/2016 (from the past 48 hour(s))  MRSA PCR Screening     Status: None   Collection Time: 10/09/2016  4:03 PM  Result Value Ref Range   MRSA by PCR NEGATIVE NEGATIVE    Comment:        The GeneXpert MRSA Assay (FDA approved for NASAL specimens only), is one component of a comprehensive MRSA colonization surveillance program. It is not intended to diagnose MRSA infection nor to guide or monitor treatment for MRSA infections.   Basic metabolic panel     Status: Abnormal   Collection Time: 09/30/2016  6:35 PM  Result Value Ref Range    Sodium 137 135 - 145 mmol/L   Potassium 3.5 3.5 - 5.1 mmol/L    Comment: DELTA CHECK NOTED   Chloride 94 (L) 101 - 111 mmol/L   CO2 31 22 - 32 mmol/L   Glucose, Bld 133 (H) 65 - 99 mg/dL   BUN 22 (H) 6 - 20 mg/dL   Creatinine, Ser 1.47 (H) 0.61 - 1.24 mg/dL   Calcium 8.6 (L) 8.9 - 10.3 mg/dL   GFR calc non Af Amer 54 (L) >60 mL/min   GFR calc Af Amer >60 >60 mL/min    Comment: (NOTE) The eGFR has been calculated using the CKD EPI equation. This calculation has not been validated in all clinical situations. eGFR's persistently <60 mL/min signify possible Chronic Kidney Disease.    Anion gap 12 5 - 15  .Cooxemetry Panel (carboxy, met, total hgb, O2 sat)     Status: None   Collection Time: 09/30/2016  8:58 PM  Result Value Ref Range   Total hemoglobin 14.8 12.0 - 16.0 g/dL   O2 Saturation 25.8 %   Carboxyhemoglobin 0.9 0.5 - 1.5 %   Methemoglobin 0.9 0.0 - 1.5 %  Lactic acid, plasma     Status: Abnormal   Collection Time: 10/09/2016  9:21 PM  Result Value Ref Range   Lactic Acid, Venous 2.8 (HH) 0.5 - 1.9 mmol/L    Comment: CRITICAL RESULT CALLED TO, READ BACK BY AND VERIFIED WITH: [place] RN [phone] GREEN R   .Cooxemetry Panel (carboxy, met, total hgb, O2 sat)     Status: None   Collection Time: 09/29/2016 11:50 PM  Result Value Ref Range   Total hemoglobin 14.3 12.0 - 16.0 g/dL   O2 Saturation 46.4 %   Carboxyhemoglobin 1.1 0.5 - 1.5 %   Methemoglobin 0.9 0.0 - 1.5 %  Basic metabolic panel     Status: Abnormal   Collection Time: 10/11/16  4:22 AM  Result Value Ref Range   Sodium 135 135 - 145 mmol/L   Potassium 3.9 3.5 - 5.1 mmol/L   Chloride 93 (L) 101 - 111 mmol/L   CO2 32 22 - 32 mmol/L   Glucose, Bld 252 (H) 65 - 99 mg/dL   BUN 24 (H) 6 - 20 mg/dL   Creatinine, Ser 1.67 (H) 0.61 - 1.24 mg/dL   Calcium 8.3 (L) 8.9 -  10.3 mg/dL   GFR calc non Af Amer 47 (L) >60 mL/min   GFR calc Af Amer 54 (L) >60 mL/min    Comment: (NOTE) The eGFR has been calculated using the  CKD EPI equation. This calculation has not been validated in all clinical situations. eGFR's persistently <60 mL/min signify possible Chronic Kidney Disease.    Anion gap 10 5 - 15  Magnesium     Status: Abnormal   Collection Time: 10/11/16  4:22 AM  Result Value Ref Range   Magnesium 1.5 (L) 1.7 - 2.4 mg/dL  Hepatic function panel     Status: Abnormal   Collection Time: 10/11/16  4:22 AM  Result Value Ref Range   Total Protein 6.8 6.5 - 8.1 g/dL   Albumin 3.1 (L) 3.5 - 5.0 g/dL   AST 62 (H) 15 - 41 U/L   ALT 39 17 - 63 U/L   Alkaline Phosphatase 86 38 - 126 U/L   Total Bilirubin 2.8 (H) 0.3 - 1.2 mg/dL   Bilirubin, Direct 1.2 (H) 0.1 - 0.5 mg/dL   Indirect Bilirubin 1.6 (H) 0.3 - 0.9 mg/dL  Protime-INR     Status: Abnormal   Collection Time: 10/11/16  4:22 AM  Result Value Ref Range   Prothrombin Time 44.3 (H) 11.4 - 15.2 seconds   INR 4.54 (HH)     Comment: REPEATED TO VERIFY CRITICAL RESULT CALLED TO, READ BACK BY AND VERIFIED WITH: AMANDA Ashippun RN [phone] BY SHORT T   Cooxemetry Panel (carboxy, met, total hgb, O2 sat)     Status: None   Collection Time: 10/11/16  4:24 AM  Result Value Ref Range   Total hemoglobin 14.3 12.0 - 16.0 g/dL   O2 Saturation 61.6 %   Carboxyhemoglobin 1.2 0.5 - 1.5 %   Methemoglobin 0.9 0.0 - 1.5 %  TSH     Status: None   Collection Time: 10/11/16  6:30 AM  Result Value Ref Range   TSH 2.444 0.350 - 4.500 uIU/mL    Comment: Performed by a 3rd Generation assay with a functional sensitivity of <=0.01 uIU/mL.  Lactic acid, plasma     Status: Abnormal   Collection Time: 10/11/16 12:10 PM  Result Value Ref Range   Lactic Acid, Venous 3.9 (HH) 0.5 - 1.9 mmol/L    Comment: CRITICAL RESULT CALLED TO, READ BACK BY AND VERIFIED WITH: [employer] [phone] WILDERK   Magnesium     Status: Abnormal   Collection Time: 10/11/16 12:10 PM  Result Value Ref Range   Magnesium 2.7 (H) 1.7 - 2.4 mg/dL  Basic metabolic panel     Status:  Abnormal   Collection Time: 10/11/16 12:10 PM  Result Value Ref Range   Sodium 129 (L) 135 - 145 mmol/L   Potassium 3.7 3.5 - 5.1 mmol/L   Chloride 88 (L) 101 - 111 mmol/L   CO2 28 22 - 32 mmol/L   Glucose, Bld 355 (H) 65 - 99 mg/dL   BUN 25 (H) 6 - 20 mg/dL   Creatinine, Ser 1.81 (H) 0.61 - 1.24 mg/dL   Calcium 8.4 (L) 8.9 - 10.3 mg/dL   GFR calc non Af Amer 42 (L) >60 mL/min   GFR calc Af Amer 49 (L) >60 mL/min    Comment: (NOTE) The eGFR has been calculated using the CKD EPI equation. This calculation has not been validated in all clinical situations. eGFR's persistently <60 mL/min signify possible Chronic Kidney Disease.    Anion gap 13  5 - 15  Glucose, capillary     Status: Abnormal   Collection Time: 10/11/16 11:41 PM  Result Value Ref Range   Glucose-Capillary 186 (H) 65 - 99 mg/dL  Basic metabolic panel     Status: Abnormal   Collection Time: 10/17/2016  4:42 AM  Result Value Ref Range   Sodium 129 (L) 135 - 145 mmol/L   Potassium 4.9 3.5 - 5.1 mmol/L    Comment: DELTA CHECK NOTED   Chloride 87 (L) 101 - 111 mmol/L   CO2 25 22 - 32 mmol/L   Glucose, Bld 147 (H) 65 - 99 mg/dL   BUN 34 (H) 6 - 20 mg/dL   Creatinine, Ser 2.90 (H) 0.61 - 1.24 mg/dL    Comment: DELTA CHECK NOTED   Calcium 8.8 (L) 8.9 - 10.3 mg/dL   GFR calc non Af Amer 24 (L) >60 mL/min   GFR calc Af Amer 28 (L) >60 mL/min    Comment: (NOTE) The eGFR has been calculated using the CKD EPI equation. This calculation has not been validated in all clinical situations. eGFR's persistently <60 mL/min signify possible Chronic Kidney Disease.    Anion gap 17 (H) 5 - 15  Protime-INR     Status: Abnormal   Collection Time: 09/27/2016  4:42 AM  Result Value Ref Range   Prothrombin Time 61.6 (H) 11.4 - 15.2 seconds    Comment: REPEATED TO VERIFY   INR 6.86 (HH)     Comment: REPEATED TO VERIFY CRITICAL RESULT CALLED TO, READ BACK BY AND VERIFIED WITH: T.TOMLINSON,RN [id number] 09/29/2016 G.MCADOO   Magnesium      Status: Abnormal   Collection Time: 09/29/2016  4:42 AM  Result Value Ref Range   Magnesium 2.7 (H) 1.7 - 2.4 mg/dL  Lactic acid, plasma     Status: Abnormal   Collection Time: 10/11/2016  4:44 AM  Result Value Ref Range   Lactic Acid, Venous 4.8 (HH) 0.5 - 1.9 mmol/L    Comment: CRITICAL RESULT CALLED TO, READ BACK BY AND VERIFIED WITH: MORRIS,J RN 10/23/2016 0528 JORDANS   Type and screen [place]     Status: None   Collection Time: 10/19/2016  5:00 AM  Result Value Ref Range   ABO/RH(D) B POS    Antibody Screen NEG    Sample Expiration 10/15/2016   Hepatic function panel     Status: Abnormal   Collection Time: 10/15/2016  5:00 AM  Result Value Ref Range   Total Protein 7.4 6.5 - 8.1 g/dL   Albumin 3.3 (L) 3.5 - 5.0 g/dL   AST 287 (H) 15 - 41 U/L    Comment: RESULTS CONFIRMED BY MANUAL DILUTION   ALT 184 (H) 17 - 63 U/L   Alkaline Phosphatase 82 38 - 126 U/L   Total Bilirubin 2.5 (H) 0.3 - 1.2 mg/dL   Bilirubin, Direct 1.2 (H) 0.1 - 0.5 mg/dL   Indirect Bilirubin 1.3 (H) 0.3 - 0.9 mg/dL  ABO/Rh     Status: None   Collection Time: 09/28/2016  5:04 AM  Result Value Ref Range   ABO/RH(D) B POS   Cooxemetry Panel (carboxy, met, total hgb, O2 sat)     Status: None   Collection Time: 10/18/2016  5:11 AM  Result Value Ref Range   Total hemoglobin 13.9 12.0 - 16.0 g/dL   O2 Saturation 76.4 %   Carboxyhemoglobin 1.4 0.5 - 1.5 %   Methemoglobin 0.7 0.0 - 1.5 %  Prepare fresh frozen  plasma     Status: None (Preliminary result)   Collection Time: 10/16/2016  7:21 AM  Result Value Ref Range   Unit Number [id number]    Blood Component Type THAWED PLASMA    Unit division 00    Status of Unit ISSUED    Unit tag comment VERBAL ORDERS PER DR CLEGG    Transfusion Status OK TO TRANSFUSE    Unit Number [id number]    Blood Component Type THAWED PLASMA    Unit division 00    Status of Unit ISSUED    Unit tag comment VERBAL ORDERS PER DR CLEGG    Transfusion Status OK TO  TRANSFUSE    Unit Number [id number]    Blood Component Type THAWED PLASMA    Unit division 00    Status of Unit ISSUED    Unit tag comment VERBAL ORDERS PER DR CLEGG    Transfusion Status OK TO TRANSFUSE   Glucose, capillary     Status: Abnormal   Collection Time: 10/13/2016  8:01 AM  Result Value Ref Range   Glucose-Capillary 186 (H) 65 - 99 mg/dL  Blood gas, arterial     Status: Abnormal   Collection Time: 10/23/2016  8:10 AM  Result Value Ref Range   O2 Content 4.0 L/min   Delivery systems NASAL CANNULA    pH, Arterial 7.451 (H) 7.350 - 7.450   pCO2 arterial 38.0 32.0 - 48.0 mmHg   pO2, Arterial 100 83.0 - 108.0 mmHg   Bicarbonate 26.0 20.0 - 28.0 mmol/L   Acid-Base Excess 2.3 (H) 0.0 - 2.0 mmol/L   O2 Saturation 97.3 %   Patient temperature 98.6    Collection site LEFT RADIAL    Drawn by [id number]    Sample type ARTERIAL DRAW    Allens test (pass/fail) PASS PASS  I-STAT 3, venous blood gas (G3P V)     Status: Abnormal   Collection Time: 10/14/2016  9:31 AM  Result Value Ref Range   pH, Ven 7.369 7.250 - 7.430   pCO2, Ven 50.1 44.0 - 60.0 mmHg   pO2, Ven 30.0 (LL) 32.0 - 45.0 mmHg   Bicarbonate 28.9 (H) 20.0 - 28.0 mmol/L   TCO2 30 0 - 100 mmol/L   O2 Saturation 55.0 %   Acid-Base Excess 3.0 (H) 0.0 - 2.0 mmol/L   Patient temperature HIDE    Sample type VENOUS    Comment NOTIFIED PHYSICIAN   I-STAT 3, venous blood gas (G3P V)     Status: Abnormal   Collection Time: 10/15/2016  9:32 AM  Result Value Ref Range   pH, Ven 7.370 7.250 - 7.430   pCO2, Ven 50.5 44.0 - 60.0 mmHg   pO2, Ven 29.0 (LL) 32.0 - 45.0 mmHg   Bicarbonate 29.2 (H) 20.0 - 28.0 mmol/L   TCO2 31 0 - 100 mmol/L   O2 Saturation 53.0 %   Acid-Base Excess 3.0 (H) 0.0 - 2.0 mmol/L   Patient temperature HIDE    Sample type VENOUS   I-STAT 3, arterial blood gas (G3+)     Status: Abnormal   Collection Time: 09/28/2016  9:58 AM  Result Value Ref Range   pH, Arterial 7.411 7.350 - 7.450   pCO2 arterial 39.6 32.0 -  48.0 mmHg   pO2, Arterial 81.0 (L) 83.0 - 108.0 mmHg   Bicarbonate 25.1 20.0 - 28.0 mmol/L   TCO2 26 0 - 100 mmol/L   O2 Saturation 96.0 %   Acid-Base Excess 1.0 0.0 - 2.0  mmol/L   Patient temperature HIDE    Sample type ARTERIAL    Ct Head Wo Contrast  Result Date: 09/29/2016 CLINICAL DATA:  Syncope. EXAM: CT HEAD WITHOUT CONTRAST TECHNIQUE: Contiguous axial images were obtained from the base of the skull through the vertex without intravenous contrast. COMPARISON:  CT scan of July 03, 2014. 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 No mass effect or midline shift is noted. Ventricular size is within normal limits. There is no evidence of mass lesion, hemorrhage or acute infarction. Vascular: Atherosclerosis of carotid siphons is noted. Skull: Bony calvarium is unremarkable. Sinuses/Orbits: Visualized paranasal sinuses appear normal. Other: None. IMPRESSION: Stable old infarctions as described above. No acute intracranial abnormality seen. Electronically Signed   By: Marijo Conception, M.D.   On: 10/17/2016 11:54   Dg Chest Port 1 View  Result Date: 10/02/2016 CLINICAL DATA:  PICC line placement EXAM: PORTABLE CHEST 1 VIEW COMPARISON:  10/11/2016 at 10:29 FINDINGS: There is a new right upper extremity PICC line extending into the low SVC. There is marked unchanged cardiomegaly. No airspace consolidation. No large effusion. No pneumothorax. IMPRESSION: Satisfactorily positioned right upper extremity PICC line. No other significant interval changes. Electronically Signed   By: Andreas Newport M.D.   On: 10/25/2016 21:01   Dg Chest Port 1 View  Result Date: 10/02/2016 CLINICAL DATA:  Syncope today, cough for 1 week EXAM: PORTABLE CHEST 1 VIEW COMPARISON:  05/13/2016 FINDINGS: There is mild bilateral interstitial thickening. There is no focal parenchymal opacity. There is no pleural effusion or pneumothorax. There is stable  cardiomegaly. There is a dual lead AICD. The osseous structures are unremarkable. IMPRESSION: Cardiomegaly with mild pulmonary vascular congestion. Electronically Signed   By: Kathreen Devoid   On: 10/15/2016 10:38    ROS: all other systems reviewed and are negative as per HPI  Blood pressure 96/64, pulse 86, temperature 97.6 F (36.4 C), temperature source Oral, resp. rate (!) 28, height 5' 7"  (1.702 m), weight 85.6 kg (188 lb 11.4 oz), SpO2 92 %. Physical Exam  GEN: sitting up in bed, increased WOB HEENT: sclerae muddy, EOMI, PERRL NECK: + JVD to angle of mandible PULM: increased WOB, clear anteriorly but crackles at bases CV tachycardic, S1 S2, parasternal heave, S3 present ABD distended, + liver edge 3-4 cm below costal margin, hypoactive bowel sounds EXT: nonpitting LE edema; warm extremities NEURO: a little confused   Assessment/Plan Pt is a 30M with a PMH sig for NICM with EF 10% (04/2016), ICD, h/o LV thrombus, HTN, and atrial fibrillation who has acute oliguric kidney injury in the setting of florid cardiogenic shock.  1.  Cardiogenic shock 2/2 acute systolic heart failure exac: on pressors, vol overloaded, EF 10%. Going to cath lab for [facility] and IABP.  2.  AKI on CKD/ vol overload: baseline cr 1.2-1.4.  not responsive to Lasix. Will try to pull off fluid with CRRT but at an EF of 10% sometimes even just circulating blood throughout CRRT machine causes arrhythmias.  Will see how he tolerates dialysis without fluid removal for a brief time at the initiation of the procedure and then increase Uf.  3.  Vtach: lido dc, on amio  4.  Coagulopathy: Vit K and FFP.  Secondary to #1  5.  Congestive hepatopathy: secondary to #1 as well  6.  Anion gap metabolic acidosis: likely due to elevated lactate.  CRRT will help correct acidosis but only clears 3% of  lactate.    7.  Dispo: critically ill, mortality high.  Will continue all aggressive measures.   Madelon Lips, MD [place] Cell [phone] pgr [phone] 10/11/2016, 10:30 AM

## 2016-10-12 NOTE — Significant Event (Signed)
I was contacted by the nurse regarding patient's renal function getting worse and lactic acid increasing. His current CVP is in the 30's as well. He did have episodes of VT earlier, but because of worsening cardiogenic shock, started patient on low-dose dobutamine.  Will talk with the daytime cardiologist regarding medication changes.  Burton Apley, MD

## 2016-10-12 NOTE — Progress Notes (Signed)
Inpatient Diabetes Program Recommendations  AACE/ADA: New Consensus Statement on Inpatient Glycemic Control (2015)  Target Ranges:  Prepandial:   less than 140 mg/dL      Peak postprandial:   less than 180 mg/dL (1-2 hours)      Critically ill patients:  140 - 180 mg/dL  Results for RUBERT, FREDIANI (MRN [id number]) as of 10/08/2016 11:45  Ref. Range 10/11/2016 23:41 09/29/2016 08:01  Glucose-Capillary Latest Ref Range: 65 - 99 mg/dL 186 (H) 186 (H)  Results for JOWEL, WALTNER (MRN [id number]) as of 10/08/2016 11:45  Ref. Range 10/20/2016 18:35 10/11/2016 04:22 10/11/2016 12:10 10/04/2016 04:42  Glucose Latest Ref Range: 65 - 99 mg/dL 133 (H) 252 (H) 355 (H) 147 (H)    Results for YAHSIR, WICKENS (MRN [id number]) as of 10/05/2016 11:45  Ref. Range 10/02/2015 14:02  Hemoglobin A1C Latest Ref Range: 4.8 - 5.6 % 7.8 (H)   Review of Glycemic Control  Diabetes history: No Outpatient Diabetes medications: NA Current orders for Inpatient glycemic control: Novolog 0-9 units TID with meals, Novolog 0-5 units QHS  Inpatient Diabetes Program Recommendations: Correction (SSI): Noted no insulin has been given today with reason "NPO". Novolog correction insulin is intended to get glucose to target goals and should be given even if patient is NPO.  HgbA1C: Last A1C in the chart was 7.8% on 10/02/15 but no reference to results in progress notes or discharge note on 10/05/15. Per ADA, if A1C is 6.5% or greater then criteria is met to diagnosis with DM. Please order a current A1C to evaluate glycemic control over the past 2-3 months. Patient will need to follow up with PCP regarding glycemic control and will likely need DM medication at time of discharge to take as an outpatient.  NOTE: In reviewing chart, noted no documented history of DM. However, noted an A1C of 7.8% on 10/02/15. Per ADA, if A1C is 6.5% or greater then criteria is met to diagnosis with DM. Please order a current A1C to evaluate glycemic control  over the past 2-3 months. If patient is diagnosed with DM, please inform patient and bedside nursing staff so patient can be educated by bedside nursing.  Patient will need to follow up with PCP regarding glycemic control and may need DM medication at time of discharge to take as an outpatient if glucose continues to be elevated and A1C is elevated.  Thanks, Barnie Alderman, RN, MSN, CDE Diabetes Coordinator Inpatient Diabetes Program [phone] (Team Pager from 8am to 5pm)

## 2016-10-12 NOTE — Progress Notes (Signed)
Advanced Heart Failure Rounding Note   Subjective:    Admitted with VT/syncope. PICC placed with initial CO-OX 26%. Started on milrinone 0.375 mcg + Norepi 5 mcg. Last night  Todays CO-OX is 76%. Creatinine trending up 1.4>1.6>2.8. Received high dose lasix yesterday (480 mg lasix total). Minimal urine output. Lactate 4.8  SOB. Confused.     Objective:   Weight Range:  Vital Signs:   Temp:  [97.4 F (36.3 C)-98.2 F (36.8 C)] 98.2 F (36.8 C) (11/17 0400) Pulse Rate:  [59-105] 87 (11/17 0600) Resp:  [23-34] 28 (11/17 0600) BP: (63-114)/(23-88) 97/73 (11/17 0600) SpO2:  [92 %-100 %] 96 % (11/17 0600) Weight:  [188 lb 11.4 oz (85.6 kg)] 188 lb 11.4 oz (85.6 kg) (11/17 0434) Last BM Date: 09/26/2016  Weight change: Filed Weights   09/29/2016 1559 10/11/16 0654 10/16/2016 0434  Weight: 177 lb 14.6 oz (80.7 kg) 180 lb 4.8 oz (81.8 kg) 188 lb 11.4 oz (85.6 kg)    Intake/Output:   Intake/Output Summary (Last 24 hours) at 09/28/2016 0705 Last data filed at 10/13/2016 0600  Gross per 24 hour  Intake          2535.01 ml  Output              151 ml  Net          2384.01 ml     Physical Exam: CVP 24  General:  Dyspneic at rest. In bed HEENT: normal Neck: supple. JVP to ear . Carotids 2+ bilat; no bruits. No lymphadenopathy or thryomegaly appreciated. Cor: PMI nondisplaced. Regular rate & rhythm. No rubs, or murmurs.+S3 Lungs: Crackles throughout.  Abdomen: soft, nontender, +distended. No hepatosplenomegaly. No bruits or masses. Good bowel sounds. Extremities: no cyanosis, clubbing, rash, edema. RUE PICC. R and LLE 2+ edema. Ted hose present.  Neuro: alert to self x1.  cranial nerves grossly intact. moves all 4 extremities w/o difficulty.   Telemetry: SR  Labs: Basic Metabolic Panel:  Recent Labs Lab 10/05/2016 0957 09/26/2016 1835 10/11/16 0422 10/11/16 1210 10/22/2016 0442  NA 138 137 135 129* 129*  K 2.8* 3.5 3.9 3.7 4.9  CL 94* 94* 93* 88* 87*  CO2 31 31 32 28 25    GLUCOSE 151* 133* 252* 355* 147*  BUN 18 22* 24* 25* 34*  CREATININE 1.28* 1.47* 1.67* 1.81* 2.90*  CALCIUM 8.6* 8.6* 8.3* 8.4* 8.8*  MG 1.8  --  1.5* 2.7* 2.7*    Liver Function Tests:  Recent Labs Lab 10/13/2016 0957 10/11/16 0422  AST 31 62*  ALT 16* 39  ALKPHOS 88 86  BILITOT 2.8* 2.8*  PROT 7.5 6.8  ALBUMIN 3.3* 3.1*   No results for input(s): LIPASE, AMYLASE in the last 168 hours. No results for input(s): AMMONIA in the last 168 hours.  CBC:  Recent Labs Lab 10/09/2016 0957  WBC 6.6  NEUTROABS 3.1  HGB 14.7  HCT 43.9  MCV 87.8  PLT 169    Cardiac Enzymes: No results for input(s): CKTOTAL, CKMB, CKMBINDEX, TROPONINI in the last 168 hours.  BNP: BNP (last 3 results)  Recent Labs  05/16/16 0420 06/21/16 1218 10/08/2016 0957  BNP 2,453.6* 2,504.3* 3,542.3*    ProBNP (last 3 results) No results for input(s): PROBNP in the last 8760 hours.    Other results:  Imaging: Ct Head Wo Contrast  Result Date: 09/29/2016 CLINICAL DATA:  Syncope. EXAM: CT HEAD WITHOUT CONTRAST TECHNIQUE: Contiguous axial images were obtained from the base of the skull  through the vertex without intravenous contrast. COMPARISON:  CT scan of July 03, 2014. 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 No mass effect or midline shift is noted. Ventricular size is within normal limits. There is no evidence of mass lesion, hemorrhage or acute infarction. Vascular: Atherosclerosis of carotid siphons is noted. Skull: Bony calvarium is unremarkable. Sinuses/Orbits: Visualized paranasal sinuses appear normal. Other: None. IMPRESSION: Stable old infarctions as described above. No acute intracranial abnormality seen. Electronically Signed   By: Lupita Raider, M.D.   On: 10/20/2016 11:54   Dg Chest Port 1 View  Result Date: 09/26/2016 CLINICAL DATA:  PICC line placement EXAM: PORTABLE CHEST 1 VIEW COMPARISON:  10/09/2016 at  10:29 FINDINGS: There is a new right upper extremity PICC line extending into the low SVC. There is marked unchanged cardiomegaly. No airspace consolidation. No large effusion. No pneumothorax. IMPRESSION: Satisfactorily positioned right upper extremity PICC line. No other significant interval changes. Electronically Signed   By: Ellery Plunk M.D.   On: 10/19/2016 21:01   Dg Chest Port 1 View  Result Date: 10/13/2016 CLINICAL DATA:  Syncope today, cough for 1 week EXAM: PORTABLE CHEST 1 VIEW COMPARISON:  05/13/2016 FINDINGS: There is mild bilateral interstitial thickening. There is no focal parenchymal opacity. There is no pleural effusion or pneumothorax. There is stable cardiomegaly. There is a dual lead AICD. The osseous structures are unremarkable. IMPRESSION: Cardiomegaly with mild pulmonary vascular congestion. Electronically Signed   By: Elige Ko   On: 10/07/2016 10:38     Medications:     Scheduled Medications: . furosemide  160 mg Intravenous Q6H  . Influenza vac split quadrivalent PF  0.5 mL Intramuscular Tomorrow-1000  . insulin aspart  0-5 Units Subcutaneous QHS  . insulin aspart  0-9 Units Subcutaneous TID WC  . magnesium oxide  400 mg Oral BID  . sodium chloride flush  10-40 mL Intracatheter Q12H  . sodium chloride flush  3 mL Intravenous Q12H    Infusions: . amiodarone 30 mg/hr (11/11/2016 0400)  . DOBUTamine 2.5 mcg/kg/min (11-11-16 0556)  . lidocaine 1 mg/min (November 11, 2016 1610)  . milrinone 0.375 mcg/kg/min (2016/11/11 0444)  . norepinephrine (LEVOPHED) Adult infusion 5.013 mcg/min (11-Nov-2016 0200)    PRN Medications: sodium chloride, acetaminophen, ondansetron (ZOFRAN) IV, sodium chloride flush, sodium chloride flush, traMADol   Assessment/Plan/Discussion    1. Syncope - VT [employer]  Device interrogation showed multiple episodes of VT. Prior to admit he was supposed to be on amio daily. We called his pharmacy today and he last picked up prescription  for amio in Apri 2017. Load amio per EP. K repleted. Mag 1.5. Mag replaced. Check Mag this afternoon.  2. A/C Systolic Heart Failure-Cardiogenic Shock  NICM. ECHO EF 10%. With RV failure June 2017. Marland Kitchen BNP >3000. On admit he had marked volume overload with prominent S3. PICC placed with initial CO-OX  26%. Started milrinone and titrated up to 0.375 mcg+ norepi 5 mcg. Last night added dobutamine 2.5 mcg.   Todays CO-OX is 76%. CVP 22-24. Received high dose IV diuretics with no urine output.. Give 5 mg metolazone now and 160 mg IV lasix twice daily.  Off dig and spiro.   Stat ECHO.  3. PAF- starting amio drip. On coumadin. INR 6.86. Receiving FFP. 4. H/O LV thrombus 5. Acute on CKD III- watch renal function closely.  Creatinine trending up-->1.67 6. H/O ETOH  7. H/O tobacco abuse 8. Hypokalemia- K 4.9  9. Hypomagnesium- Mag 1.5--> Mag replaced.  10. AKI- Creatinine 2.8 . Uremic. Consult Nephrology. Place HD catheter. Give 3FFP and vitamin 5 mg now.  11. Acute Respiratory Failure- Dyspneic at rest. ABG now. Anticipate intubation this morning but will wait on ABG.  12. Hypercoagulable INR 6.86. As above receiving FFP and vit k. 13. Lactic Acidosis- 4.8 today  Will need to go to cath lab for swan and hd catheter.   I attempted to call his Mom and Wife. Message left to return call.     Length of Stay: 2   Amy Clegg NP-C  10/08/2016, 7:05 AM  Advanced Heart Failure Team Pager [phone] (M-F; 7a - 4p)  Please contact [facility] for night-coverage after hours (4p -7a ) and weekends on [email or web address]  Patient seen and examined with Tonye Becket, NP. We discussed all aspects of the encounter. I agree with the assessment and plan as stated above.   He is critically ill with progressive acidosis and multi-system organ failure despite improvements in his co-ox with inotropic support. Now on 3 pressors. INR 6.8. I am bit puzzled by his progressive acidosis in setting of normalized co-ox/cardiac  output. ? Due to ARF or ischemic gut.    Will check ABG. Give FFP and vitamin K and take to cath lab for swan, trialysis catheter placement and possible IABP. Will likely need intubation. Have d/w Renal.   He realizes risk of death is quite high. Not candidate for ECMO given socail situation and poor candidate for VAD or transplant.   The patient is critically ill with multiple organ systems failure and requires high complexity decision making for assessment and support, frequent evaluation and titration of therapies, application of advanced monitoring technologies and extensive interpretation of multiple databases.   Critical Care Time devoted to patient care services described in this note is 45 Minutes.  Bensimhon, Daniel,MD 8:17 AM

## 2016-10-12 NOTE — Progress Notes (Addendum)
ANTICOAGULATION CONSULT NOTE - Initial Consult  Pharmacy Consult for heparin Indication: Balloon Pump/PAF  No Known Allergies  Patient Measurements: Height: 5\' 7"  (170.2 cm) Weight: 188 lb 11.4 oz (85.6 kg) IBW/kg (Calculated) : 66.1 Heparin Dosing Weight: 80.7 kg  Vital Signs: Temp: 97.6 F (36.4 C) (11/17 0845) Temp Source: Oral (11/17 0845) BP: 96/64 (11/17 1003) Pulse Rate: 86 (11/17 0845)  Labs:  Recent Labs  16-Oct-2016 0957  10/11/16 0422 10/11/16 1210 09/29/2016 0442  HGB 14.7  --   --   --   --   HCT 43.9  --   --   --   --   PLT 169  --   --   --   --   LABPROT 30.2*  --  44.3*  --  61.6*  INR 2.82  --  4.54*  --  6.86*  CREATININE 1.28*  < > 1.67* 1.81* 2.90*  < > = values in this interval not displayed.  Estimated Creatinine Clearance: 32.2 mL/min (by C-G formula based on SCr of 2.9 mg/dL (H)).   Medical History: Past Medical History:  Diagnosis Date  . AICD (automatic cardioverter/defibrillator) present    [employer] 100  . Alcohol abuse    hx  . Cardiomyopathy    secondary  . CHF (congestive heart failure) (HCC)   . Chronic anticoagulation    Coumadin for hx LV thrombus and PAF  . Gout   . HTN (hypertension)    uncontrolled  . Tobacco abuse   . Ventricular tachycardia (HCC)     Assessment: 49 yoM on warfarin PTA now held (last dose 11/15) and started on low-dose heparin drip per Dr. Gala Romney s/p IABP placement on 11/17 for cardiogenic shock. INR today is 6.86 in the setting of shock liver and pt was given vitamin k 5mg  IV and FFP prior to cath lab. MD requested very low infusion rate at 500 units/hr given INR and high likelihood of further procedures. Per Dr. Gala Romney, follow-up repeat INR and if it remains >3 hold off on titrating heparin drip rate for now.  Goal of Therapy:  Heparin level 0.2-0.5 units/ml Monitor platelets by anticoagulation protocol: Yes   Plan:  -Heparin 500 units/hr -Follow-up INR this  afternoon -Follow heparin level, CBC, S/Sx bleeding daily  ADDENDUM: Repeat INR down to 3.66. Per Dr. Gala Romney, continue heparin at 500 units/hr and do not measure heparin levels or try to titrate the drip rate this evening. Follow-up INR in the morning, if < 3.0 will likely need to begin titrating the drip as needed to goal 0.2-0.5. Follow-up a heparin level in the morning only to ensure the heparin level is not supratherapeutic.  Plan: -Continue heparin at 500 units/hr -Follow-up INR in the morning -Follow-up heparin level in the morning  Fredonia Highland, PharmD PGY-1 Pharmacy Resident Pager: [phone] 10/20/2016

## 2016-10-12 NOTE — Progress Notes (Signed)
Report received from nightshift RN. Cardiology NP at bedside to see patient. Ordered 3 FFP , Vitamin K to be given and to prep for Cath Procedure. Explained everything to the patient and he verbalized consent. Nephro also came by to explain plan of the day with the patient. Verbalized consent as well. Obtained ABG and placed secondary IV site. Made multiple calls from hospital and from patient's phone to family to inform of procedures with no answer or return call currently. High risk medications infusing pre policy, FFP given under emergency release and infused rapidly per orders. Cath Team came and received report at bedside. Transported with no distress noted. Will transfer care at this time until arrival back to unit.

## 2016-10-12 NOTE — Progress Notes (Signed)
  Echocardiogram 2D Echocardiogram has been performed.  Arvil Chaco 10/25/2016, 3:42 PM

## 2016-10-12 NOTE — Progress Notes (Signed)
CRITICAL VALUE ALERT  Critical value received:  Lactic acid   Date of notification:  10/20/2016  Time of notification:  0530  Critical value read back:Yes.    Nurse who received alert:  Jannet Askew  MD notified (1st page):  Orson Aloe  Time of first page:  0534  MD notified (2nd page):  Time of second page:  Responding MD:  Orson Aloe   Time MD responded:  [phone]

## 2016-10-12 NOTE — Procedures (Signed)
Patient seen on CRRT. Pulling 200 mL/ hr without difficulty.  IABP in place.  Systemic heparin gtt QB 250 Treatment adjusted as needed.  Bufford Buttner MD [facility] Cell [phone] pgr [phone] 9:30 PM

## 2016-10-12 NOTE — Progress Notes (Signed)
CRRT started with no distress noted from patient. Called Dr.Upton for order clarification and made changes. Will continue to monitor.

## 2016-10-12 NOTE — H&P (View-Only) (Signed)
  Advanced Heart Failure Rounding Note   Subjective:    Admitted with VT/syncope. PICC placed with initial CO-OX 26%. Started on milrinone 0.375 mcg + Norepi 5 mcg. Last night  Todays CO-OX is 76%. Creatinine trending up 1.4>1.6>2.8. Received high dose lasix yesterday (480 mg lasix total). Minimal urine output. Lactate 4.8  SOB. Confused.     Objective:   Weight Range:  Vital Signs:   Temp:  [97.4 F (36.3 C)-98.2 F (36.8 C)] 98.2 F (36.8 C) (11/17 0400) Pulse Rate:  [59-105] 87 (11/17 0600) Resp:  [23-34] 28 (11/17 0600) BP: (63-114)/(23-88) 97/73 (11/17 0600) SpO2:  [92 %-100 %] 96 % (11/17 0600) Weight:  [188 lb 11.4 oz (85.6 kg)] 188 lb 11.4 oz (85.6 kg) (11/17 0434) Last BM Date: 10/16/2016  Weight change: Filed Weights   09/28/2016 1559 10/11/16 0654 10/02/2016 0434  Weight: 177 lb 14.6 oz (80.7 kg) 180 lb 4.8 oz (81.8 kg) 188 lb 11.4 oz (85.6 kg)    Intake/Output:   Intake/Output Summary (Last 24 hours) at 10/02/2016 0705 Last data filed at 10/20/2016 0600  Gross per 24 hour  Intake          2535.01 ml  Output              151 ml  Net          2384.01 ml     Physical Exam: CVP 24  General:  Dyspneic at rest. In bed HEENT: normal Neck: supple. JVP to ear . Carotids 2+ bilat; no bruits. No lymphadenopathy or thryomegaly appreciated. Cor: PMI nondisplaced. Regular rate & rhythm. No rubs, or murmurs.+S3 Lungs: Crackles throughout.  Abdomen: soft, nontender, +distended. No hepatosplenomegaly. No bruits or masses. Good bowel sounds. Extremities: no cyanosis, clubbing, rash, edema. RUE PICC. R and LLE 2+ edema. Ted hose present.  Neuro: alert to self x1.  cranial nerves grossly intact. moves all 4 extremities w/o difficulty.   Telemetry: SR  Labs: Basic Metabolic Panel:  Recent Labs Lab 10/06/2016 0957 10/07/2016 1835 10/11/16 0422 10/11/16 1210 10/14/2016 0442  NA 138 137 135 129* 129*  K 2.8* 3.5 3.9 3.7 4.9  CL 94* 94* 93* 88* 87*  CO2 31 31 32 28 25    GLUCOSE 151* 133* 252* 355* 147*  BUN 18 22* 24* 25* 34*  CREATININE 1.28* 1.47* 1.67* 1.81* 2.90*  CALCIUM 8.6* 8.6* 8.3* 8.4* 8.8*  MG 1.8  --  1.5* 2.7* 2.7*    Liver Function Tests:  Recent Labs Lab 10/17/2016 0957 10/11/16 0422  AST 31 62*  ALT 16* 39  ALKPHOS 88 86  BILITOT 2.8* 2.8*  PROT 7.5 6.8  ALBUMIN 3.3* 3.1*   No results for input(s): LIPASE, AMYLASE in the last 168 hours. No results for input(s): AMMONIA in the last 168 hours.  CBC:  Recent Labs Lab 10/07/2016 0957  WBC 6.6  NEUTROABS 3.1  HGB 14.7  HCT 43.9  MCV 87.8  PLT 169    Cardiac Enzymes: No results for input(s): CKTOTAL, CKMB, CKMBINDEX, TROPONINI in the last 168 hours.  BNP: BNP (last 3 results)  Recent Labs  05/16/16 0420 06/21/16 1218 09/29/2016 0957  BNP 2,453.6* 2,504.3* 3,542.3*    ProBNP (last 3 results) No results for input(s): PROBNP in the last 8760 hours.    Other results:  Imaging: Ct Head Wo Contrast  Result Date: 10/05/2016 CLINICAL DATA:  Syncope. EXAM: CT HEAD WITHOUT CONTRAST TECHNIQUE: Contiguous axial images were obtained from the base of the skull   through the vertex without intravenous contrast. COMPARISON:  CT scan of July 03, 2014. 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 No mass effect or midline shift is noted. Ventricular size is within normal limits. There is no evidence of mass lesion, hemorrhage or acute infarction. Vascular: Atherosclerosis of carotid siphons is noted. Skull: Bony calvarium is unremarkable. Sinuses/Orbits: Visualized paranasal sinuses appear normal. Other: None. IMPRESSION: Stable old infarctions as described above. No acute intracranial abnormality seen. Electronically Signed   By: Theus  Green Jr, M.D.   On: 10/21/2016 11:54   Dg Chest Port 1 View  Result Date: 10/08/2016 CLINICAL DATA:  PICC line placement EXAM: PORTABLE CHEST 1 VIEW COMPARISON:  10/24/2016 at  10:29 FINDINGS: There is a new right upper extremity PICC line extending into the low SVC. There is marked unchanged cardiomegaly. No airspace consolidation. No large effusion. No pneumothorax. IMPRESSION: Satisfactorily positioned right upper extremity PICC line. No other significant interval changes. Electronically Signed   By: Johntavius Shepard R Mitchell M.D.   On: 10/09/2016 21:01   Dg Chest Port 1 View  Result Date: 10/07/2016 CLINICAL DATA:  Syncope today, cough for 1 week EXAM: PORTABLE CHEST 1 VIEW COMPARISON:  05/13/2016 FINDINGS: There is mild bilateral interstitial thickening. There is no focal parenchymal opacity. There is no pleural effusion or pneumothorax. There is stable cardiomegaly. There is a dual lead AICD. The osseous structures are unremarkable. IMPRESSION: Cardiomegaly with mild pulmonary vascular congestion. Electronically Signed   By: Hetal  Patel   On: 10/06/2016 10:38     Medications:     Scheduled Medications: . furosemide  160 mg Intravenous Q6H  . Influenza vac split quadrivalent PF  0.5 mL Intramuscular Tomorrow-1000  . insulin aspart  0-5 Units Subcutaneous QHS  . insulin aspart  0-9 Units Subcutaneous TID WC  . magnesium oxide  400 mg Oral BID  . sodium chloride flush  10-40 mL Intracatheter Q12H  . sodium chloride flush  3 mL Intravenous Q12H    Infusions: . amiodarone 30 mg/hr (10/01/2016 0400)  . DOBUTamine 2.5 mcg/kg/min (10/07/2016 0556)  . lidocaine 1 mg/min (10/17/2016 0611)  . milrinone 0.375 mcg/kg/min (10/11/2016 0444)  . norepinephrine (LEVOPHED) Adult infusion 5.013 mcg/min (10/22/2016 0200)    PRN Medications: sodium chloride, acetaminophen, ondansetron (ZOFRAN) IV, sodium chloride flush, sodium chloride flush, traMADol   Assessment/Plan/Discussion    1. Syncope - VT [employer]  Device interrogation showed multiple episodes of VT. Prior to admit he was supposed to be on amio daily. We called his pharmacy today and he last picked up prescription  for amio in Apri 2017. Load amio per EP. K repleted. Mag 1.5. Mag replaced. Check Mag this afternoon.  2. A/C Systolic Heart Failure-Cardiogenic Shock  NICM. ECHO EF 10%. With RV failure June 2017. . BNP >3000. On admit he had marked volume overload with prominent S3. PICC placed with initial CO-OX  26%. Started milrinone and titrated up to 0.375 mcg+ norepi 5 mcg. Last night added dobutamine 2.5 mcg.   Todays CO-OX is 76%. CVP 22-24. Received high dose IV diuretics with no urine output.. Give 5 mg metolazone now and 160 mg IV lasix twice daily.  Off dig and spiro.   Stat ECHO.  3. PAF- starting amio drip. On coumadin. INR 6.86. Receiving FFP. 4. H/O LV thrombus 5. Acute on CKD III- watch renal function closely.  Creatinine trending up-->1.67 6. H/O ETOH  7. H/O tobacco abuse 8. Hypokalemia- K 4.9     9. Hypomagnesium- Mag 1.5--> Mag replaced.  10. AKI- Creatinine 2.8 . Uremic. Consult Nephrology. Place HD catheter. Give 3FFP and vitamin 5 mg now.  11. Acute Respiratory Failure- Dyspneic at rest. ABG now. Anticipate intubation this morning but will wait on ABG.  12. Hypercoagulable INR 6.86. As above receiving FFP and vit k. 13. Lactic Acidosis- 4.8 today  Will need to go to cath lab for swan and hd catheter.   I attempted to call his Mom and Wife. Message left to return call.     Length of Stay: 2   Amy Clegg NP-C  10/24/2016, 7:05 AM  Advanced Heart Failure Team Pager [phone] (M-F; 7a - 4p)  Please contact [facility] for night-coverage after hours (4p -7a ) and weekends on [email or web address]  Patient seen and examined with Amy Clegg, NP. We discussed all aspects of the encounter. I agree with the assessment and plan as stated above.   He is critically ill with progressive acidosis and multi-system organ failure despite improvements in his co-ox with inotropic support. Now on 3 pressors. INR 6.8. I am bit puzzled by his progressive acidosis in setting of normalized co-ox/cardiac  output. ? Due to ARF or ischemic gut.    Will check ABG. Give FFP and vitamin K and take to cath lab for swan, trialysis catheter placement and possible IABP. Will likely need intubation. Have d/w Renal.   He realizes risk of death is quite high. Not candidate for ECMO given socail situation and poor candidate for VAD or transplant.   The patient is critically ill with multiple organ systems failure and requires high complexity decision making for assessment and support, frequent evaluation and titration of therapies, application of advanced monitoring technologies and extensive interpretation of multiple databases.   Critical Care Time devoted to patient care services described in this note is 45 Minutes.  Darin Arndt,MD 8:17 AM     

## 2016-10-12 NOTE — Interval H&P Note (Signed)
History and Physical Interval Note:  10/09/2016 10:37 AM  Parker Johnson  has presented today for surgery, with the diagnosis of cardiogenic shock, acute renal failure The various methods of treatment have been discussed with the patient and family. After consideration of risks, benefits and other options for treatment, the patient has consented to  Procedure(s): Right Heart Cath (N/A), IABP and trialysis catheter placement as a surgical intervention .    Patient unable to provide consent due to altered mental status and shock. Attempted to reach family members but unsuccessful. Emergent consent implied based on my previous discussions with him that he would want everything done to keep him alive.   Arvilla Meres MD

## 2016-10-13 LAB — RENAL FUNCTION PANEL
ANION GAP: 13 (ref 5–15)
Albumin: 3.2 g/dL — ABNORMAL LOW (ref 3.5–5.0)
Albumin: 3.3 g/dL — ABNORMAL LOW (ref 3.5–5.0)
Anion gap: 11 (ref 5–15)
BUN: 33 mg/dL — ABNORMAL HIGH (ref 6–20)
BUN: 26 mg/dL — AB (ref 6–20)
CALCIUM: 8.1 mg/dL — AB (ref 8.9–10.3)
CHLORIDE: 90 mmol/L — AB (ref 101–111)
CHLORIDE: 93 mmol/L — AB (ref 101–111)
CO2: 26 mmol/L (ref 22–32)
CO2: 24 mmol/L (ref 22–32)
Calcium: 8.3 mg/dL — ABNORMAL LOW (ref 8.9–10.3)
Creatinine, Ser: 2.66 mg/dL — ABNORMAL HIGH (ref 0.61–1.24)
Creatinine, Ser: 2.26 mg/dL — ABNORMAL HIGH (ref 0.61–1.24)
GFR calc Af Amer: 31 mL/min — ABNORMAL LOW (ref >60)
GFR calc Af Amer: 37 mL/min — ABNORMAL LOW (ref >60)
GFR calc non Af Amer: 27 mL/min — ABNORMAL LOW (ref >60)
GFR calc non Af Amer: 32 mL/min — ABNORMAL LOW (ref >60)
GLUCOSE: 146 mg/dL — AB (ref 65–99)
GLUCOSE: 97 mg/dL (ref 65–99)
POTASSIUM: 5 mmol/L (ref 3.5–5.1)
POTASSIUM: 5.1 mmol/L (ref 3.5–5.1)
Phosphorus: 4 mg/dL (ref 2.5–4.6)
Phosphorus: 4.1 mg/dL (ref 2.5–4.6)
SODIUM: 129 mmol/L — AB (ref 135–145)
Sodium: 128 mmol/L — ABNORMAL LOW (ref 135–145)

## 2016-10-13 LAB — PREPARE FRESH FROZEN PLASMA
UNIT DIVISION: 0
UNIT DIVISION: 0
Unit division: 0

## 2016-10-13 LAB — CBC
HEMATOCRIT: 37.9 % — AB (ref 39.0–52.0)
Hemoglobin: 12.9 g/dL — ABNORMAL LOW (ref 13.0–17.0)
MCH: 28.7 pg (ref 26.0–34.0)
MCHC: 34 g/dL (ref 30.0–36.0)
MCV: 84.4 fL (ref 78.0–100.0)
Platelets: 129 10*3/uL — ABNORMAL LOW (ref 150–400)
RBC: 4.49 MIL/uL (ref 4.22–5.81)
RDW: 20.4 % — AB (ref 11.5–15.5)
WBC: 6 10*3/uL (ref 4.0–10.5)

## 2016-10-13 LAB — BASIC METABOLIC PANEL
ANION GAP: 13 (ref 5–15)
BUN: 34 mg/dL — AB (ref 6–20)
CO2: 26 mmol/L (ref 22–32)
Calcium: 8.2 mg/dL — ABNORMAL LOW (ref 8.9–10.3)
Chloride: 89 mmol/L — ABNORMAL LOW (ref 101–111)
Creatinine, Ser: 2.7 mg/dL — ABNORMAL HIGH (ref 0.61–1.24)
GFR calc Af Amer: 30 mL/min — ABNORMAL LOW (ref >60)
GFR, EST NON AFRICAN AMERICAN: 26 mL/min — AB (ref >60)
Glucose, Bld: 150 mg/dL — ABNORMAL HIGH (ref 65–99)
POTASSIUM: 5.1 mmol/L (ref 3.5–5.1)
SODIUM: 128 mmol/L — AB (ref 135–145)

## 2016-10-13 LAB — GLUCOSE, CAPILLARY
GLUCOSE-CAPILLARY: 103 mg/dL — AB (ref 65–99)
GLUCOSE-CAPILLARY: 130 mg/dL — AB (ref 65–99)
Glucose-Capillary: 150 mg/dL — ABNORMAL HIGH (ref 65–99)
Glucose-Capillary: 140 mg/dL — ABNORMAL HIGH (ref 65–99)

## 2016-10-13 LAB — PROTIME-INR
INR: 2.92
Prothrombin Time: 31.1 seconds — ABNORMAL HIGH (ref 11.4–15.2)

## 2016-10-13 LAB — COOXEMETRY PANEL
Carboxyhemoglobin: 0.9 % (ref 0.5–1.5)
METHEMOGLOBIN: 0.9 % (ref 0.0–1.5)
O2 Saturation: 59.8 %
TOTAL HEMOGLOBIN: 12.4 g/dL (ref 12.0–16.0)

## 2016-10-13 LAB — HEPARIN LEVEL (UNFRACTIONATED)
Heparin Unfractionated: <0.1 IU/mL — ABNORMAL LOW (ref 0.30–0.70)
Heparin Unfractionated: <0.1 IU/mL — ABNORMAL LOW (ref 0.30–0.70)

## 2016-10-13 LAB — MAGNESIUM: Magnesium: 2.7 mg/dL — ABNORMAL HIGH (ref 1.7–2.4)

## 2016-10-13 NOTE — Progress Notes (Signed)
ANTICOAGULATION CONSULT NOTE - Initial Consult  Pharmacy Consult for heparin Indication: IABP/PAF  No Known Allergies  Patient Measurements: Height: 5\' 7"  (170.2 cm) Weight: 189 lb 6 oz (85.9 kg) IBW/kg (Calculated) : 66.1 Heparin Dosing Weight: 80.7 kg  Vital Signs: Temp: 95.7 F (35.4 C) (11/18 0700) Temp Source: Core (Comment) (11/18 0700) BP: 110/54 (11/18 0700) Pulse Rate: 70 (11/18 0700)  Labs:  Recent Labs  10/21/2016 0957  10/09/2016 0442 10/22/2016 1220 10/09/2016 1600 10/25/2016 2136 10/13/16 0544  HGB 14.7  --   --   --   --  12.8* 12.9*  HCT 43.9  --   --   --   --  36.7* 37.9*  PLT 169  --   --   --   --  138* 129*  LABPROT 30.2*  < > 61.6* 37.3*  --  34.5* 31.1*  INR 2.82  < > 6.86* 3.66  --  3.32 2.92  HEPARINUNFRC  --   --   --   --   --   --  <0.10*  CREATININE 1.28*  < > 2.90*  --  3.23*  --  2.70*  2.66*  < > = values in this interval not displayed.  Estimated Creatinine Clearance: 34.6 mL/min (by C-G formula based on SCr of 2.7 mg/dL (H)).   Medical History: Past Medical History:  Diagnosis Date  . AICD (automatic cardioverter/defibrillator) present    [employer] 100  . Alcohol abuse    hx  . Cardiomyopathy    secondary  . CHF (congestive heart failure) (HCC)   . Chronic anticoagulation    Coumadin for hx LV thrombus and PAF  . Gout   . HTN (hypertension)    uncontrolled  . Tobacco abuse   . Ventricular tachycardia (HCC)     Assessment: 49 yoM on warfarin PTA now held (last dose 11/15) and started on low-dose heparin drip per Dr. Gala Romney s/p IABP placement on 11/17 for cardiogenic shock/Afib. INR on admit  is 6.86 in the setting of shock liver and pt was given vitamin k 5mg  IV and FFP prior to cath lab. MD requested very low infusion rate at 500 units/hr initally given INR and high likelihood of further procedures. INR 2.9 today and CI improved.  Increase heparin to goal, CBC.  Goal of Therapy:  Heparin level 0.2-0.5  units/ml Monitor platelets by anticoagulation protocol: Yes   Plan:  -Increase Heparin 1000 units/hr Check 6hr HL -Follow heparin level, CBC, S/Sx bleeding daily  Leota Sauers Pharm.D. CPP, BCPS Clinical Pharmacist [phone] 10/13/2016 9:44 AM

## 2016-10-13 NOTE — Progress Notes (Signed)
[facility] Progress Note    Assessment/ Plan:   Pt is a 17M with a PMH sig for NICM with EF 10% (04/2016), ICD, h/o LV thrombus, HTN, and atrial fibrillation who has acute oliguric kidney injury in the setting of florid cardiogenic shock.  1.  Cardiogenic shock 2/2 acute systolic heart failure exac: on pressors, vol overloaded, EF 10%. + Swann, + IABP 1:1, systemic heparin gtt  2.  AKI on CKD/ vol overload: baseline cr 1.2-1.4.  not responsive to Lasix.  Tolerating UF well.  At 300/ hr, continue.  CVPs high  3.  Vtach: lido dc, on amio.  No recurrent overnight  4.  Coagulopathy: Vit K and FFP.  Secondary to #1  5.  Congestive hepatopathy: secondary to #1 as well  6.  Anion gap metabolic acidosis: likely due to elevated lactate.  resolved with CRRT  7.  Hypomag: repleting  7.  Dispo: critically ill, mortality high.  Will continue all aggressive measures.  Subjective:    IABP, HD cath, Swann all done yesterday.  Started CRRT.  Pulling 300/hr.  CVPs still high, CO/CI reflective of cardiogenic shock.  Dobutamine/ norepi   Objective:   BP (!) 110/54   Pulse 70   Temp (!) 95.7 F (35.4 C) (Core (Comment))   Resp 15   Ht 5\' 7"  (1.702 m)   Wt 85.9 kg (189 lb 6 oz)   SpO2 97%   BMI 29.66 kg/m   Intake/Output Summary (Last 24 hours) at 10/13/16 0915 Last data filed at 10/13/16 0800  Gross per 24 hour  Intake          2225.54 ml  Output             3741 ml  Net         -1515.46 ml   Weight change: 0.3 kg (10.6 oz)  Physical Exam: Physical Exam  GEN: lying in bed, normal WOB HEENT: sclerae muddy, EOMI, PERRL NECK: + JVD to angle of mandible PULM: increased WOB, clear anteriorly but crackles at bases CV tachycardic, S1 S2, parasternal heave, S3 present ABD distended, + liver edge 3-4 cm below costal margin, hypoactive bowel sounds EXT: nonpitting LE edema; tips of toes and fingers cool.  IABP in place. NEURO: AAO x 3 this AM  Imaging: No  results found.  Labs: BMET  Recent Labs Lab 10/15/2016 0957 10/25/2016 1835 10/11/16 0422 10/11/16 1210 Mar 25, 2016 0442 Mar 25, 2016 1600 10/13/16 0544  NA 138 137 135 129* 129* 127* 128*  129*  K 2.8* 3.5 3.9 3.7 4.9 4.7 5.1  5.0  CL 94* 94* 93* 88* 87* 86* 89*  90*  CO2 31 31 32 28 25 28 26  26   GLUCOSE 151* 133* 252* 355* 147* 148* 150*  146*  BUN 18 22* 24* 25* 34* 40* 34*  33*  CREATININE 1.28* 1.47* 1.67* 1.81* 2.90* 3.23* 2.70*  2.66*  CALCIUM 8.6* 8.6* 8.3* 8.4* 8.8* 8.3* 8.2*  8.1*  PHOS  --   --   --   --   --  4.6 4.0   CBC  Recent Labs Lab 10/22/2016 0957 Mar 25, 2016 2136 10/13/16 0544  WBC 6.6 5.7 6.0  NEUTROABS 3.1  --   --   HGB 14.7 12.8* 12.9*  HCT 43.9 36.7* 37.9*  MCV 87.8 84.4 84.4  PLT 169 138* 129*    Medications:    . furosemide  160 mg Intravenous Q6H  . Influenza vac split quadrivalent PF  0.5 mL Intramuscular  Tomorrow-1000  . insulin aspart  0-5 Units Subcutaneous QHS  . insulin aspart  0-9 Units Subcutaneous TID WC  . sodium chloride flush  10-40 mL Intracatheter Q12H  . sodium chloride flush  3 mL Intravenous Q12H      Bufford Buttner MD [facility] Cell [phone] [phone] 10/13/2016, 9:15 AM

## 2016-10-13 NOTE — Procedures (Signed)
Patient seen on CRRT. Pulling 300 mL/ hr without difficulty.  IABP in place.  Systemic heparin gtt QB 250.  CVPs high 20s Treatment adjusted as needed.  Bufford Buttner MD [facility] Cell [phone] pgr [phone] 9:21 AM

## 2016-10-13 NOTE — Progress Notes (Signed)
ANTICOAGULATION CONSULT NOTE - Initial Consult  Pharmacy Consult for heparin Indication: IABP/PAF  No Known Allergies  Patient Measurements: Height: 5\' 7"  (170.2 cm) Weight: 189 lb 6 oz (85.9 kg) IBW/kg (Calculated) : 66.1 Heparin Dosing Weight: 80.7 kg  Vital Signs: Temp: 95.7 F (35.4 C) (11/18 1000) Temp Source: Core (Comment) (11/18 0800) BP: 117/92 (11/18 1000) Pulse Rate: 71 (11/18 1000)  Labs:  Recent Labs  10/20/2016 1220 10/14/2016 1600 10/06/2016 2136 10/13/16 0544 10/13/16 1720  HGB  --   --  12.8* 12.9*  --   HCT  --   --  36.7* 37.9*  --   PLT  --   --  138* 129*  --   LABPROT 37.3*  --  34.5* 31.1*  --   INR 3.66  --  3.32 2.92  --   HEPARINUNFRC  --   --   --  <0.10* <0.10*  CREATININE  --  3.23*  --  2.70*  2.66* 2.26*    Estimated Creatinine Clearance: 41.4 mL/min (by C-G formula based on SCr of 2.26 mg/dL (H)).   Medical History: Past Medical History:  Diagnosis Date  . AICD (automatic cardioverter/defibrillator) present    [employer] 100  . Alcohol abuse    hx  . Cardiomyopathy    secondary  . CHF (congestive heart failure) (HCC)   . Chronic anticoagulation    Coumadin for hx LV thrombus and PAF  . Gout   . HTN (hypertension)    uncontrolled  . Tobacco abuse   . Ventricular tachycardia (HCC)     Assessment: 49 yoM on warfarin PTA now held (last dose 11/15) and started on low-dose heparin drip per Dr. Gala Romney s/p IABP placement on 11/17 for cardiogenic shock/Afib. INR on admit  is 6.86 in the setting of shock liver and pt was given vitamin k 5mg  IV and FFP prior to cath lab. MD requested very low infusion rate at 500 units/hr initally given INR and high likelihood of further procedures. INR 2.9 today and CI improved.  Increase heparin to goal, CBC.  This evening's heparin level is subtherapeutic at <0.1.  No issues noted with his infusion per RN.  Goal of Therapy:  Heparin level 0.2-0.5 units/ml Monitor platelets by  anticoagulation protocol: Yes   Plan:  -Increase Heparin to 1250 units/hr Check 6hr HL -Follow heparin level, CBC, S/Sx bleeding daily  Estella Husk, Pharm.D., BCPS, AAHIVP Clinical Pharmacist Phone: [phone] or [phone] 10/13/2016, 6:28 PM

## 2016-10-13 NOTE — Progress Notes (Signed)
Advanced Heart Failure Rounding Note   Subjective:    Admitted with VT/syncope and profound cardiogenic shock. PICC placed with initial CO-OX 26%.   Underwent IABP and Trialysis cath placement on 11/17  Now on dobutamine 5 and norepi 40  Pulling 200/hr on CVVHD. MAPs 80-90s. Feels ok. Lactic acidosis resolved. No significant VT  INR 2.9  RA  30 PA 50/39 PCWP 37 Thermo 3.4/1.7 SVR 932    Objective:   Weight Range:  Vital Signs:   Temp:  [94.3 F (34.6 C)-97.5 F (36.4 C)] 95.7 F (35.4 C) (11/18 0700) Pulse Rate:  [0-164] 70 (11/18 0700) Resp:  [0-67] 15 (11/18 0700) BP: (82-124)/(45-90) 110/54 (11/18 0700) SpO2:  [0 %-100 %] 97 % (11/18 0700) Weight:  [85.9 kg (189 lb 6 oz)] 85.9 kg (189 lb 6 oz) (11/18 0500) Last BM Date: 10/24/2016  Weight change: Filed Weights   10/11/16 0654 2016/09/20 0434 10/13/16 0500  Weight: 81.8 kg (180 lb 4.8 oz) 85.6 kg (188 lb 11.4 oz) 85.9 kg (189 lb 6 oz)    Intake/Output:   Intake/Output Summary (Last 24 hours) at 10/13/16 0849 Last data filed at 10/13/16 0800  Gross per 24 hour  Intake          2225.54 ml  Output             3741 ml  Net         -1515.46 ml     Physical Exam:  General:  Lying in bed. Critically ill HEENT: normal Neck: supple. JVP to ear. RIJ swan . Carotids 2+ bilat; no bruits. No lymphadenopathy or thryomegaly appreciated. Cor: PMI nondisplaced. Regular rate & rhythm. No rubs, or murmurs.+S3 Lungs: Crackles anteriorly Abdomen: soft, nontender, +distended. No hepatosplenomegaly. No bruits or masses. Good bowel sounds. Extremities: no cyanosis, clubbing, rash  RUE PICC. R and LLE 3+ edema. Ted hose present. R femoral trialysis cath and IABP Neuro: awake. Follows commands cranial nerves grossly intact. moves all 4 extremities w/o difficulty.   Telemetry: SR  Labs: Basic Metabolic Panel:  Recent Labs Lab 10/23/2016 0957  10/11/16 0422 10/11/16 1210 2016/09/20 0442 2016/09/20 1600 10/13/16 0544  NA  138  < > 135 129* 129* 127* 128*  129*  K 2.8*  < > 3.9 3.7 4.9 4.7 5.1  5.0  CL 94*  < > 93* 88* 87* 86* 89*  90*  CO2 31  < > 32 28 25 28 26  26   GLUCOSE 151*  < > 252* 355* 147* 148* 150*  146*  BUN 18  < > 24* 25* 34* 40* 34*  33*  CREATININE 1.28*  < > 1.67* 1.81* 2.90* 3.23* 2.70*  2.66*  CALCIUM 8.6*  < > 8.3* 8.4* 8.8* 8.3* 8.2*  8.1*  MG 1.8  --  1.5* 2.7* 2.7*  --  2.7*  PHOS  --   --   --   --   --  4.6 4.0  < > = values in this interval not displayed.  Liver Function Tests:  Recent Labs Lab 10/05/2016 0957 10/11/16 0422 2016/09/20 0500 2016/09/20 1600 10/13/16 0544  AST 31 62* 287*  --   --   ALT 16* 39 184*  --   --   ALKPHOS 88 86 82  --   --   BILITOT 2.8* 2.8* 2.5*  --   --   PROT 7.5 6.8 7.4  --   --   ALBUMIN 3.3* 3.1* 3.3* 3.2* 3.2*  No results for input(s): LIPASE, AMYLASE in the last 168 hours. No results for input(s): AMMONIA in the last 168 hours.  CBC:  Recent Labs Lab 10/21/2016 0957 10/03/2016 2136 10/13/16 0544  WBC 6.6 5.7 6.0  NEUTROABS 3.1  --   --   HGB 14.7 12.8* 12.9*  HCT 43.9 36.7* 37.9*  MCV 87.8 84.4 84.4  PLT 169 138* 129*    Cardiac Enzymes: No results for input(s): CKTOTAL, CKMB, CKMBINDEX, TROPONINI in the last 168 hours.  BNP: BNP (last 3 results)  Recent Labs  05/16/16 0420 06/21/16 1218 10/02/2016 0957  BNP 2,453.6* 2,504.3* 3,542.3*    ProBNP (last 3 results) No results for input(s): PROBNP in the last 8760 hours.    Other results:  Imaging: No results found.   Medications:     Scheduled Medications: . furosemide  160 mg Intravenous Q6H  . Influenza vac split quadrivalent PF  0.5 mL Intramuscular Tomorrow-1000  . insulin aspart  0-5 Units Subcutaneous QHS  . insulin aspart  0-9 Units Subcutaneous TID WC  . sodium chloride flush  10-40 mL Intracatheter Q12H  . sodium chloride flush  3 mL Intravenous Q12H    Infusions: . amiodarone 60 mg/hr (10/13/16 0253)  . DOBUTamine 5 mcg/kg/min (09/28/2016  2000)  . heparin 500 Units/hr (09/30/2016 2000)  . milrinone Stopped (10/01/2016 1640)  . norepinephrine (LEVOPHED) Adult infusion 40 mcg/min (10/13/16 0998)  . dialysis replacement fluid (prismasate) 300 mL/hr at 10/04/2016 1500  . dialysis replacement fluid (prismasate) 200 mL/hr at 10/20/2016 1457  . dialysate (PRISMASATE) 2,200 mL/hr at 10/13/16 0831    PRN Medications: sodium chloride, acetaminophen, heparin, ondansetron (ZOFRAN) IV, sodium chloride, sodium chloride flush, sodium chloride flush, traMADol   Assessment/Plan/Discussion    1. Syncope - VT [employer]  Device interrogation showed multiple episodes of VT. Prior to admit he was supposed to be on amio daily. We called his pharmacy today and he last picked up prescription for amio in Apri 2017. Load amio per EP. K repleted. Mag 1.5. Mag replaced. Check Mag this afternoon.  2. A/C Systolic Biventricular Heart Failure (end-stage) -> Cardiogenic Shock  -- NICM. ECHO EF 10%. With RV failure June 2017. Marland Kitchen BNP >3000. On admit he had marked volume overload with prominent S3. --PICC placed with initial CO-OX  26%. Started milrinone and titrated up to 0.375 mcg+ norepi 5 mcg.  -- IABP placed 11/17. Now on norepi 40 and dobutamine 5 mcg.  -- Swan numbers with markedly elevated biventricular filling pressure. CO remains low but improving -- Not candidate for VAD or transplant so have not pursued ECMO 3. PAF --Maintaining NSR on amio. Increase heparin to full dose 4. H/O LV thrombus 5. Acute on CKD III --Anuric. Now on CVVHD (started 11/17) --Marked volume overload. Continue to pull. 6. H/O ETOH  7. H/O tobacco abuse 8. Hyperkalemia-\ 9. Hypomagnesium- Mag 1.5--> Mag replaced.  10. Acute Respiratory Failure- stabilizing with volume removal    He remains critically. Continue full support with IABP, pressors, CVVHD. Prognosis extremely guarded. Not candidate for ECMO and VAD.    The patient is critically ill with multiple organ  systems failure and requires high complexity decision making for assessment and support, frequent evaluation and titration of therapies, application of advanced monitoring technologies and extensive interpretation of multiple databases.   Critical Care Time devoted to patient care services described in this note is 45 Minutes.    Length of Stay: 3   Arvilla Meres MD 10/13/2016, 8:49 AM  Advanced Heart Failure Team Pager [phone] (M-F; [place])  Please contact [facility] for night-coverage after hours (4p -7a ) and weekends on [email or web address]

## 2016-10-13 NOTE — Progress Notes (Signed)
SUBJECTIVE:  Much improved.  Status post balloon pump and dialysis catheter. Continued on pressors and inotropes.    CURRENT MEDICATIONS: . Influenza vac split quadrivalent PF  0.5 mL Intramuscular Tomorrow-1000  . insulin aspart  0-5 Units Subcutaneous QHS  . insulin aspart  0-9 Units Subcutaneous TID WC  . sodium chloride flush  10-40 mL Intracatheter Q12H  . sodium chloride flush  3 mL Intravenous Q12H   . amiodarone 60 mg/hr (10/13/16 0941)  . DOBUTamine 5 mcg/kg/min (10/11/2016 2000)  . heparin 1,000 Units/hr (10/13/16 1003)  . norepinephrine (LEVOPHED) Adult infusion 40 mcg/min (10/13/16 9977)  . dialysis replacement fluid (prismasate) 300 mL/hr at 10/13/16 0950  . dialysis replacement fluid (prismasate) 200 mL/hr at 09/30/2016 1457  . dialysate (PRISMASATE) 2,200 mL/hr at 10/13/16 0955    OBJECTIVE: Physical Exam: Vitals:   10/13/16 0700 10/13/16 0800 10/13/16 0900 10/13/16 1000  BP: (!) 110/54 (!) 116/96 115/74 (!) 117/92  Pulse: 70 72 64 71  Resp: 15 (!) 7 14 (!) 25  Temp: (!) 95.7 F (35.4 C) (!) 95.5 F (35.3 C) (!) 95.7 F (35.4 C) (!) 95.7 F (35.4 C)  TempSrc: Core (Comment) Core (Comment)    SpO2: 97% 100% 99% 99%  Weight:      Height:        Intake/Output Summary (Last 24 hours) at 10/13/16 1038 Last data filed at 10/13/16 0900  Gross per 24 hour  Intake          2225.54 ml  Output             4053 ml  Net         -1827.46 ml    Telemetry Personally reviewed  still with nonsustained ventricular tachycardia.  GEN- The patient ismroe awake and alert Well developed and nourished in mod  distress HENT normal Neck supple  Clear Regular rate and rhythm, no murmurs or gallops Abd-soft with active BS No Clubbing cyanosis 1-2+ edema Skin-warm and dry A & Oriented  Grossly normal sensory and motor function  LABS: Basic Metabolic Panel:  Recent Labs  41/42/39 0442 10/08/2016 1600 10/13/16 0544  NA 129* 127* 128*  129*  K 4.9 4.7 5.1  5.0  CL  87* 86* 89*  90*  CO2 25 28 26  26   GLUCOSE 147* 148* 150*  146*  BUN 34* 40* 34*  33*  CREATININE 2.90* 3.23* 2.70*  2.66*  CALCIUM 8.8* 8.3* 8.2*  8.1*  MG 2.7*  --  2.7*  PHOS  --  4.6 4.0   Liver Function Tests:  Recent Labs  10/11/16 0422 10/01/2016 0500 10/25/2016 1600 10/13/16 0544  AST 62* 287*  --   --   ALT 39 184*  --   --   ALKPHOS 86 82  --   --   BILITOT 2.8* 2.5*  --   --   PROT 6.8 7.4  --   --   ALBUMIN 3.1* 3.3* 3.2* 3.2*   CBC:  Recent Labs  09/30/2016 2136 10/13/16 0544  WBC 5.7 6.0  HGB 12.8* 12.9*  HCT 36.7* 37.9*  MCV 84.4 84.4  PLT 138* 129*   Thyroid Function Tests:  Recent Labs  10/11/16 0630  TSH 2.444    RADIOLOGY: Ct Head Wo Contrast Result Date: 10-17-16 CLINICAL DATA:  Syncope. EXAM: CT HEAD WITHOUT CONTRAST TECHNIQUE: Contiguous axial images were obtained from the base of the skull through the vertex without intravenous contrast. COMPARISON:  CT scan of July 03, 2014. 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 No mass effect or midline shift is noted. Ventricular size is within normal limits. There is no evidence of mass lesion, hemorrhage or acute infarction. Vascular: Atherosclerosis of carotid siphons is noted. Skull: Bony calvarium is unremarkable. Sinuses/Orbits: Visualized paranasal sinuses appear normal. Other: None. IMPRESSION: Stable old infarctions as described above. No acute intracranial abnormality seen. Electronically Signed   By: Lupita RaiderJames  Green Jr, M.D.   On: 10/04/2016 11:54   Dg Chest Port 1 View Result Date: 10/03/2016 CLINICAL DATA:  PICC line placement EXAM: PORTABLE CHEST 1 VIEW COMPARISON:  10/12/2016 at 10:29 FINDINGS: There is a new right upper extremity PICC line extending into the low SVC. There is marked unchanged cardiomegaly. No airspace consolidation. No large effusion. No pneumothorax. IMPRESSION: Satisfactorily positioned right upper  extremity PICC line. No other significant interval changes. Electronically Signed   By: Ellery Plunkaniel R Mitchell M.D.   On: 10/19/2016 21:01   ASSESSMENT AND PLAN:  Active Problems:   Ventricular fibrillation (HCC)   Cardiogenic shock (HCC)   AKI (acute kidney injury) (HCC)  1.  Ventricular tachycardia  Continue amiodarone no more. Lidocaine.  2.  Cardiogenic shock Currently on pressor support Management per AHF team Lactate up, creat up  3.  Anuric renal failure  4.  Paroxysmal atrial fibrillation Currently maintaining SR    Support by heart failure critical at this juncture. Rhythm relatively stable. We'll rebolus with amiodarone for recurrent nonsustained ventricular tachycardia. Electrolytes await dialysis.

## 2016-10-14 ENCOUNTER — Inpatient Hospital Stay (HOSPITAL_COMMUNITY): Payer: Medicaid Other

## 2016-10-14 DIAGNOSIS — R5081 Fever presenting with conditions classified elsewhere: Secondary | ICD-10-CM

## 2016-10-14 DIAGNOSIS — R570 Cardiogenic shock: Secondary | ICD-10-CM

## 2016-10-14 LAB — RENAL FUNCTION PANEL
Albumin: 3.4 g/dL — ABNORMAL LOW (ref 3.5–5.0)
Albumin: 3 g/dL — ABNORMAL LOW (ref 3.5–5.0)
Anion gap: 11 (ref 5–15)
Anion gap: 11 (ref 5–15)
BUN: 25 mg/dL — ABNORMAL HIGH (ref 6–20)
BUN: 22 mg/dL — ABNORMAL HIGH (ref 6–20)
CALCIUM: 8.4 mg/dL — AB (ref 8.9–10.3)
CALCIUM: 8 mg/dL — AB (ref 8.9–10.3)
CO2: 25 mmol/L (ref 22–32)
CO2: 24 mmol/L (ref 22–32)
CREATININE: 2.34 mg/dL — AB (ref 0.61–1.24)
CREATININE: 2.32 mg/dL — AB (ref 0.61–1.24)
Chloride: 94 mmol/L — ABNORMAL LOW (ref 101–111)
Chloride: 95 mmol/L — ABNORMAL LOW (ref 101–111)
GFR, EST AFRICAN AMERICAN: 36 mL/min — AB (ref >60)
GFR, EST AFRICAN AMERICAN: 36 mL/min — AB (ref >60)
GFR, EST NON AFRICAN AMERICAN: 31 mL/min — AB (ref >60)
GFR, EST NON AFRICAN AMERICAN: 31 mL/min — AB (ref >60)
Glucose, Bld: 104 mg/dL — ABNORMAL HIGH (ref 65–99)
Glucose, Bld: 108 mg/dL — ABNORMAL HIGH (ref 65–99)
Phosphorus: 3.8 mg/dL (ref 2.5–4.6)
Phosphorus: 3.5 mg/dL (ref 2.5–4.6)
Potassium: 5.5 mmol/L — ABNORMAL HIGH (ref 3.5–5.1)
Potassium: 4.8 mmol/L (ref 3.5–5.1)
SODIUM: 130 mmol/L — AB (ref 135–145)
SODIUM: 130 mmol/L — AB (ref 135–145)

## 2016-10-14 LAB — PROTIME-INR
INR: 2.5
Prothrombin Time: 27.4 seconds — ABNORMAL HIGH (ref 11.4–15.2)

## 2016-10-14 LAB — COOXEMETRY PANEL
Carboxyhemoglobin: 0.7 % (ref 0.5–1.5)
Carboxyhemoglobin: 0.9 % (ref 0.5–1.5)
METHEMOGLOBIN: 0.8 % (ref 0.0–1.5)
Methemoglobin: 0.9 % (ref 0.0–1.5)
O2 Saturation: 30.4 %
O2 Saturation: 48.5 %
TOTAL HEMOGLOBIN: 10.5 g/dL — AB (ref 12.0–16.0)
Total hemoglobin: 13.3 g/dL (ref 12.0–16.0)

## 2016-10-14 LAB — BLOOD CULTURE ID PANEL (REFLEXED)
Acinetobacter baumannii: NOT DETECTED
CANDIDA ALBICANS: NOT DETECTED
CANDIDA TROPICALIS: NOT DETECTED
Candida glabrata: NOT DETECTED
Candida krusei: NOT DETECTED
Candida parapsilosis: NOT DETECTED
ENTEROBACTERIACEAE SPECIES: NOT DETECTED
Enterobacter cloacae complex: NOT DETECTED
Enterococcus species: NOT DETECTED
Escherichia coli: NOT DETECTED
HAEMOPHILUS INFLUENZAE: NOT DETECTED
KLEBSIELLA PNEUMONIAE: NOT DETECTED
Klebsiella oxytoca: NOT DETECTED
Listeria monocytogenes: NOT DETECTED
METHICILLIN RESISTANCE: NOT DETECTED
NEISSERIA MENINGITIDIS: NOT DETECTED
Proteus species: NOT DETECTED
Pseudomonas aeruginosa: NOT DETECTED
SERRATIA MARCESCENS: NOT DETECTED
STAPHYLOCOCCUS AUREUS BCID: DETECTED — AB
STAPHYLOCOCCUS SPECIES: DETECTED — AB
STREPTOCOCCUS PYOGENES: NOT DETECTED
STREPTOCOCCUS SPECIES: NOT DETECTED
Streptococcus agalactiae: NOT DETECTED
Streptococcus pneumoniae: NOT DETECTED

## 2016-10-14 LAB — CBC
HEMATOCRIT: 39.7 % (ref 39.0–52.0)
HEMOGLOBIN: 13.7 g/dL (ref 13.0–17.0)
MCH: 29.4 pg (ref 26.0–34.0)
MCHC: 34.5 g/dL (ref 30.0–36.0)
MCV: 85.2 fL (ref 78.0–100.0)
Platelets: 122 10*3/uL — ABNORMAL LOW (ref 150–400)
RBC: 4.66 MIL/uL (ref 4.22–5.81)
RDW: 20.4 % — ABNORMAL HIGH (ref 11.5–15.5)
WBC: 8.8 10*3/uL (ref 4.0–10.5)

## 2016-10-14 LAB — MAGNESIUM: Magnesium: 2.7 mg/dL — ABNORMAL HIGH (ref 1.7–2.4)

## 2016-10-14 LAB — HEPARIN LEVEL (UNFRACTIONATED)
HEPARIN UNFRACTIONATED: 0.29 [IU]/mL — AB (ref 0.30–0.70)
Heparin Unfractionated: 0.27 IU/mL — ABNORMAL LOW (ref 0.30–0.70)

## 2016-10-14 LAB — LACTIC ACID, PLASMA
Lactic Acid, Venous: 1.6 mmol/L (ref 0.5–1.9)
Lactic Acid, Venous: 2.2 mmol/L (ref 0.5–1.9)

## 2016-10-14 LAB — GLUCOSE, CAPILLARY
GLUCOSE-CAPILLARY: 84 mg/dL (ref 65–99)
GLUCOSE-CAPILLARY: 117 mg/dL — AB (ref 65–99)
Glucose-Capillary: 128 mg/dL — ABNORMAL HIGH (ref 65–99)
Glucose-Capillary: 144 mg/dL — ABNORMAL HIGH (ref 65–99)

## 2016-10-14 MED ORDER — PRISMASOL BGK 0/2.5 32-2.5 MEQ/L IV SOLN
INTRAVENOUS | Status: DC
  Administered 2016-10-14 – 2016-10-15 (×2): via INTRAVENOUS_CENTRAL
  Filled 2016-10-14 (×2): qty 5000

## 2016-10-14 MED ORDER — VASOPRESSIN 20 UNIT/ML IV SOLN
0.0300 [IU]/min | INTRAVENOUS | Status: DC
  Filled 2016-10-14: qty 2

## 2016-10-14 MED ORDER — CEFAZOLIN SODIUM-DEXTROSE 2-4 GM/100ML-% IV SOLN
2.0000 g | Freq: Two times a day (BID) | INTRAVENOUS | Status: DC
  Administered 2016-10-14 – 2016-10-24 (×20): 2 g via INTRAVENOUS
  Filled 2016-10-14 (×22): qty 100

## 2016-10-14 MED ORDER — VANCOMYCIN HCL IN DEXTROSE 1-5 GM/200ML-% IV SOLN
1000.0000 mg | INTRAVENOUS | Status: DC
  Filled 2016-10-14: qty 200

## 2016-10-14 MED ORDER — PRISMASOL BGK 0/2.5 32-2.5 MEQ/L IV SOLN
INTRAVENOUS | Status: DC
  Administered 2016-10-14 – 2016-10-15 (×2): via INTRAVENOUS_CENTRAL
  Filled 2016-10-14 (×3): qty 5000

## 2016-10-14 MED ORDER — VANCOMYCIN HCL 10 G IV SOLR
1750.0000 mg | Freq: Once | INTRAVENOUS | Status: AC
  Administered 2016-10-14: 1750 mg via INTRAVENOUS
  Filled 2016-10-14: qty 1750

## 2016-10-14 MED ORDER — AMIODARONE LOAD VIA INFUSION
150.0000 mg | Freq: Once | INTRAVENOUS | Status: AC
  Administered 2016-10-14: 150 mg via INTRAVENOUS

## 2016-10-14 MED ORDER — DEXTROSE 5 % IV SOLN
2.0000 g | Freq: Two times a day (BID) | INTRAVENOUS | Status: DC
  Administered 2016-10-14: 2 g via INTRAVENOUS
  Filled 2016-10-14 (×2): qty 2

## 2016-10-14 NOTE — Procedures (Signed)
Patient seen on CRRT. Pulling 300 mL/ hr without difficulty.  IABP in place, still on 1:1.  Systemic heparin gtt QB 250.  CVPs high 20s.  Treatment adjusted as needed.  Bufford Buttner MD [facility] Cell [phone] pgr [phone] 11:34 AM

## 2016-10-14 NOTE — Progress Notes (Signed)
ANTICOAGULATION CONSULT NOTE   Pharmacy Consult for heparin Indication: IABP/PAF  No Known Allergies  Patient Measurements: Height: 5\' 7"  (170.2 cm) Weight: 189 lb 6 oz (85.9 kg) IBW/kg (Calculated) : 66.1 Heparin Dosing Weight: 80.7 kg  Vital Signs: Temp: 101.7 F (38.7 C) (11/19 0330) Temp Source: Core (Comment) (11/19 0330) BP: 120/89 (11/19 0300) Pulse Rate: 92 (11/19 0300)  Labs:  Recent Labs  09/26/2016 1600  10/11/2016 2136 10/13/16 0544 10/13/16 1720 10/14/16 0100  HGB  --   < > 12.8* 12.9*  --  13.7  HCT  --   --  36.7* 37.9*  --  39.7  PLT  --   --  138* 129*  --  122*  LABPROT  --   --  34.5* 31.1*  --  27.4*  INR  --   --  3.32 2.92  --  2.50  HEPARINUNFRC  --   --   --  <0.10* <0.10* 0.27*  CREATININE 3.23*  --   --  2.70*  2.66* 2.26*  --   < > = values in this interval not displayed.  Estimated Creatinine Clearance: 41.4 mL/min (by C-G formula based on SCr of 2.26 mg/dL (H)).   Medical History: Past Medical History:  Diagnosis Date  . AICD (automatic cardioverter/defibrillator) present    [employer] 100  . Alcohol abuse    hx  . Cardiomyopathy    secondary  . CHF (congestive heart failure) (HCC)   . Chronic anticoagulation    Coumadin for hx LV thrombus and PAF  . Gout   . HTN (hypertension)    uncontrolled  . Tobacco abuse   . Ventricular tachycardia (HCC)     Assessment: 49 yoM on warfarin PTA now held (last dose 11/15) and started on low-dose heparin drip per Dr. Gala Romney s/p IABP placement on 11/17 for cardiogenic shock/Afib. INR on admit  is 6.86 in the setting of shock liver and pt was given vitamin k 5mg  IV and FFP prior to cath lab. MD requested very low infusion rate at 500 units/hr initally given INR and high likelihood of further procedures. INR 2.5  today and CI improved.  Increase heparin to goal, CBC.  This mornings heparin level is therapeutic at 0.27 (goal 0.2 -0.5). CBC remains stable.   Goal of Therapy:   Heparin level 0.2-0.5 units/ml Monitor platelets by anticoagulation protocol: Yes   Plan:  -Continue heparin infusion at 1250 units/hr -Confirmatory heparin level in 6 hours   Pollyann Samples, PharmD, BCPS 10/14/2016, 4:07 AM Pager: [phone]

## 2016-10-14 NOTE — Progress Notes (Signed)
SUBJECTIVE:  Much improved.  Status post balloon pump and dialysis catheter. Continued on pressors and inotropes. With unfortunately increasingly. There is high fever overnight and ceftazidime and vancomycin has been initiated. Blood cultures drawn.    CURRENT MEDICATIONS: . cefTAZidime (FORTAZ)  IV  2 g Intravenous Q12H  . Influenza vac split quadrivalent PF  0.5 mL Intramuscular Tomorrow-1000  . insulin aspart  0-5 Units Subcutaneous QHS  . insulin aspart  0-9 Units Subcutaneous TID WC  . sodium chloride flush  10-40 mL Intracatheter Q12H  . sodium chloride flush  3 mL Intravenous Q12H  . vancomycin  1,750 mg Intravenous Once  . [START ON 10/15/2016] vancomycin  1,000 mg Intravenous Q24H   . amiodarone 60 mg/hr (10/14/16 0900)  . DOBUTamine 6 mcg/kg/min (10/14/16 2778)  . heparin 1,250 Units/hr (10/14/16 0900)  . norepinephrine (LEVOPHED) Adult infusion 50 mcg/min (10/14/16 0916)  . dialysis replacement fluid (prismasate) 300 mL/hr at 10/14/16 0857  . dialysis replacement fluid (prismasate) 200 mL/hr at 10/14/16 0857  . dialysate (PRISMASATE) 2,200 mL/hr at 10/14/16 0935  . vasopressin (PITRESSIN) infusion - *FOR SHOCK* Stopped (10/14/16 0800)    OBJECTIVE: Physical Exam: Vitals:   10/14/16 0656 10/14/16 0700 10/14/16 0800 10/14/16 0900  BP:  101/70 112/69 103/82  Pulse: (!) 105 (!) 51 96 (!) 105  Resp: 19 (!) 21 (!) 27 (!) 25  Temp: (!) 100.9 F (38.3 C) (!) 100.9 F (38.3 C) 100 F (37.8 C) 99.1 F (37.3 C)  TempSrc:  Core (Comment)    SpO2: 94% 95% 94% 95%  Weight:      Height:        Intake/Output Summary (Last 24 hours) at 10/14/16 0958 Last data filed at 10/14/16 0900  Gross per 24 hour  Intake             2873 ml  Output             7528 ml  Net            -4655 ml    Telemetry Personally reviewed  still with nonsustained ventricular tachycardia.  GEN- The patient is alert awake and oriented. He recognizes me.  Well developed and nourished in mod   distress HENT normal Neck supple  Clear Regular rate and rhythm, no murmurs or gallops Abd-soft with active BS No Clubbing cyanosis 1-2+ edema Skin-warm and dry A & Oriented  Grossly normal sensory and motor function  LABS: Basic Metabolic Panel:  Recent Labs  24/23/53 0544 10/13/16 1720 10/14/16 0339  NA 128*  129* 128* 130*  K 5.1  5.0 5.1 5.5*  CL 89*  90* 93* 94*  CO2 26  26 24 25   GLUCOSE 150*  146* 97 104*  BUN 34*  33* 26* 25*  CREATININE 2.70*  2.66* 2.26* 2.34*  CALCIUM 8.2*  8.1* 8.3* 8.4*  MG 2.7*  --  2.7*  PHOS 4.0 4.1 3.8   Liver Function Tests:  Recent Labs  10/18/2016 0500  10/13/16 1720 10/14/16 0339  AST 287*  --   --   --   ALT 184*  --   --   --   ALKPHOS 82  --   --   --   BILITOT 2.5*  --   --   --   PROT 7.4  --   --   --   ALBUMIN 3.3*  < > 3.3* 3.4*  < > = values in this interval not displayed. CBC:  Recent Labs  10/13/16 0544 10/14/16 0100  WBC 6.0 8.8  HGB 12.9* 13.7  HCT 37.9* 39.7  MCV 84.4 85.2  PLT 129* 122*   Thyroid Function Tests: No results for input(s): TSH, T4TOTAL, T3FREE, THYROIDAB in the last 72 hours.  Invalid input(s): FREET3  RADIOLOGY: Ct Head Wo Contrast Result Date: 07-Mar-2016 CLINICAL DATA:  Syncope. EXAM: CT HEAD WITHOUT CONTRAST TECHNIQUE: Contiguous axial images were obtained from the base of the skull through the vertex without intravenous contrast. COMPARISON:  CT scan of July 03, 2014. 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 No mass effect or midline shift is noted. Ventricular size is within normal limits. There is no evidence of mass lesion, hemorrhage or acute infarction. Vascular: Atherosclerosis of carotid siphons is noted. Skull: Bony calvarium is unremarkable. Sinuses/Orbits: Visualized paranasal sinuses appear normal. Other: None. IMPRESSION: Stable old infarctions as described above. No acute intracranial abnormality  seen. Electronically Signed   By: Lupita RaiderJames  Green Jr, M.D.   On: 07-Mar-2016 11:54   Dg Chest Port 1 View Result Date: 07-Mar-2016 CLINICAL DATA:  PICC line placement EXAM: PORTABLE CHEST 1 VIEW COMPARISON:  07-Mar-2016 at 10:29 FINDINGS: There is a new right upper extremity PICC line extending into the low SVC. There is marked unchanged cardiomegaly. No airspace consolidation. No large effusion. No pneumothorax. IMPRESSION: Satisfactorily positioned right upper extremity PICC line. No other significant interval changes. Electronically Signed   By: Ellery Plunkaniel R Mitchell M.D.   On: 07-Mar-2016 21:01   ASSESSMENT AND PLAN:  Active Problems:   Ventricular fibrillation (HCC)   Cardiogenic shock (HCC)   AKI (acute kidney injury) (HCC)  1.  Ventricular tachycardia  Continue amiodarone no more. Lidocaine.  2.  Cardiogenic shock .possibly now with superimposed sepsis Currently on pressor support Management per AHF team Lactate up, creat up  3.  Anuric renal failure  4.  Paroxysmal atrial fibrillation Currently maintaining SR    Support by heart failure critical at this juncture. Rhythm relatively stable. We'll rebolus with amiodarone for recurrent nonsustained ventricular tachycardia   The patient's situation continues to deteriorate with the requirements of higher dose pressors. Currently he is mentating.   I have told him that he is possible that he will not survive this hospitalization. I have sent the same thing device. Following. [facility]    .

## 2016-10-14 NOTE — Progress Notes (Signed)
Pt having runs of VT. Dr Graciela Husbands at bedside. New order obtained.

## 2016-10-14 NOTE — Progress Notes (Signed)
ANTICOAGULATION CONSULT NOTE   Pharmacy Consult for heparin Indication: IABP/PAF  No Known Allergies  Patient Measurements: Height: 5\' 7"  (170.2 cm) Weight: 183 lb 6.8 oz (83.2 kg) IBW/kg (Calculated) : 66.1 Heparin Dosing Weight: 80.7 kg  Vital Signs: Temp: 98.1 F (36.7 C) (11/19 1200) Temp Source: Core (Comment) (11/19 0700) BP: 102/49 (11/19 1200) Pulse Rate: 75 (11/19 1200)  Labs:  Recent Labs  10/22/2016 2136  10/13/16 0544 10/13/16 1720 10/14/16 0100 10/14/16 0339 10/14/16 1135  HGB 12.8*  --  12.9*  --  13.7  --   --   HCT 36.7*  --  37.9*  --  39.7  --   --   PLT 138*  --  129*  --  122*  --   --   LABPROT 34.5*  --  31.1*  --  27.4*  --   --   INR 3.32  --  2.92  --  2.50  --   --   HEPARINUNFRC  --   < > <0.10* <0.10* 0.27*  --  0.29*  CREATININE  --   --  2.70*  2.66* 2.26*  --  2.34*  --   < > = values in this interval not displayed.  Estimated Creatinine Clearance: 39.4 mL/min (by C-G formula based on SCr of 2.34 mg/dL (H)).   Medical History: Past Medical History:  Diagnosis Date  . AICD (automatic cardioverter/defibrillator) present    [employer] 100  . Alcohol abuse    hx  . Cardiomyopathy    secondary  . CHF (congestive heart failure) (HCC)   . Chronic anticoagulation    Coumadin for hx LV thrombus and PAF  . Gout   . HTN (hypertension)    uncontrolled  . Tobacco abuse   . Ventricular tachycardia (HCC)     Assessment: 49 yoM on warfarin PTA now held (last dose 11/15) and started on low-dose heparin drip per Dr. Gala Romney s/p IABP placement on 11/17 for cardiogenic shock/Afib. INR on admit  is 6.86 in the setting of shock liver and pt was given vitamin k 5mg  IV and FFP prior to cath lab. MD requested very low infusion rate at 500 units/hr initally given elevated INR and high likelihood of further procedures. INR 2.5  today and CI improved.  Increase heparin to goal, CBC stable. Heparin drip 1250 uts/hr HL 0.29 at  goal.  Goal of Therapy:  Heparin level 0.2-0.5 units/ml Monitor platelets by anticoagulation protocol: Yes   Plan:  -Continue heparin infusion at 1250 units/hr Daily HL, CBC  Leota Sauers Pharm.D. CPP, BCPS Clinical Pharmacist [phone] 10/14/2016 12:34 PM

## 2016-10-14 NOTE — Progress Notes (Signed)
Repeat co-ox 48.5. Dr. Dimple Casey notified. No new orders at this time. Will continue to monitor. Patient denies symptoms at this time.

## 2016-10-14 NOTE — Progress Notes (Addendum)
  10/14/16 4:49 AM  Saw patient at bedside with Hillery Aldo, RN after I was notified of mixed venous sat 30% and fevers. He is alert and oriented. Extremities are warm. He has no focal complaints. He is tachycardic to 120-130s. ECG shows sinus rhythm with bigeminy. Norepi and Dobutamine drips remain unchanged from previous with MAPs ~70. CVVH is removing 200 cc/hr. IABP is 1:1. Augmentation is not great, likely due to tachycardia. I tried 1:2, but MAPs dropped to mid-60s (returned to 1:1) Given low SVO2, we checked thermal cardiac index which was 1.73 (no significant change from prior). PA waveform is poor - RN states this is unchanged from prior. Filling pressures remain elevated. Lactic acid is 1.6 (most recent 1.9 on 11/17 at 2100).  Impression: his exam, lactic acid, and thermal cardiac index indicate that his perfusion is adequate. I am not sure why his SVO2 is so low (? PA cath auto-wedging) so we have sent a repeat sample down to the lab and ordered CXR. His IABP is not augmenting as well now that he is tachycardic, but he cannot tolerate a decrease in the dobutamine or norepi. Regarding his fevers, blood cultures and cxr are pending.  If fever does not respond to tylenol we will start empiric broad spectrum antibiotics, although I think this is a stress reaction to his critical illness.  Per Dr. Prescott Gum note, he is not a candidate for LVAD or ECMO. Prognosis is very guarded.   Berdine Dance, MD Cardiology

## 2016-10-14 NOTE — Progress Notes (Signed)
Pt lactic acid result 2.2. Pt and wife have discuss code status and decided to change to partial code ( no CPR, no intubation). Dr Graciela Husbands notified and orders placed. Wife at bedside. Will continue to monitor.

## 2016-10-14 NOTE — Progress Notes (Signed)
Dr. Dimple Casey notified of am CO-OX of 30. Order obtained for lactic acid.

## 2016-10-14 NOTE — Progress Notes (Signed)
PHARMACY - PHYSICIAN COMMUNICATION CRITICAL VALUE ALERT - BLOOD CULTURE IDENTIFICATION (BCID)  Results for orders placed or performed during the hospital encounter of 2016/10/24  Blood Culture ID Panel (Reflexed) (Collected: 10/14/2016  4:50 AM)  Result Value Ref Range   Enterococcus species NOT DETECTED NOT DETECTED   Listeria monocytogenes NOT DETECTED NOT DETECTED   Staphylococcus species DETECTED (A) NOT DETECTED   Staphylococcus aureus DETECTED (A) NOT DETECTED   Methicillin resistance NOT DETECTED NOT DETECTED   Streptococcus species NOT DETECTED NOT DETECTED   Streptococcus agalactiae NOT DETECTED NOT DETECTED   Streptococcus pneumoniae NOT DETECTED NOT DETECTED   Streptococcus pyogenes NOT DETECTED NOT DETECTED   Acinetobacter baumannii NOT DETECTED NOT DETECTED   Enterobacteriaceae species NOT DETECTED NOT DETECTED   Enterobacter cloacae complex NOT DETECTED NOT DETECTED   Escherichia coli NOT DETECTED NOT DETECTED   Klebsiella oxytoca NOT DETECTED NOT DETECTED   Klebsiella pneumoniae NOT DETECTED NOT DETECTED   Proteus species NOT DETECTED NOT DETECTED   Serratia marcescens NOT DETECTED NOT DETECTED   Haemophilus influenzae NOT DETECTED NOT DETECTED   Neisseria meningitidis NOT DETECTED NOT DETECTED   Pseudomonas aeruginosa NOT DETECTED NOT DETECTED   Candida albicans NOT DETECTED NOT DETECTED   Candida glabrata NOT DETECTED NOT DETECTED   Candida krusei NOT DETECTED NOT DETECTED   Candida parapsilosis NOT DETECTED NOT DETECTED   Candida tropicalis NOT DETECTED NOT DETECTED   50 year old male with cardiogenic shock and VT who had an IABP placed on 11/17. He is on CRRT. He developed new fevers today and had cultures sent. His blood cultures are growing Gram positive cocci identified as MSSA by BCID.  Also note he has an ICD.  I would usually consider addition of rifampin but he is on an amiodarone infusion and having runs of VT.  Rifampin may decrease the concentrations of  amiodarone.   ID will see him on 11/20 per auto-consult for Staph aureus bacteremia.    Name of physician (or Provider) Contacted: Armanda Magic, MD  Changes to prescribed antibiotics required: Change to Cefazolin 2g IV q12  Estella Husk, Pharm.D., BCPS, AAHIVP Clinical Pharmacist Phone: [phone] or [phone] 10/14/2016, 8:18 PM

## 2016-10-14 NOTE — Progress Notes (Signed)
Patient complains of chills, temp 100.9 (Core). Tylenol 650mg  PO Given. Cards fellow on call paged to notify of new symptom of fever. Will monitor.

## 2016-10-14 NOTE — Progress Notes (Signed)
[facility] Progress Note    Assessment/ Plan:   Pt is a 75M with a PMH sig for NICM with EF 10% (04/2016), ICD, h/o LV thrombus, HTN, and atrial fibrillation who has acute oliguric kidney injury in the setting of florid cardiogenic shock.  Now febrile with broad spectrum abx started  1.  Cardiogenic shock 2/2 acute systolic heart failure exac: on pressors, vol overloaded, EF 10%. + Swann, + IABP 1:1, systemic heparin gtt  2.  AKI on CKD/ vol overload: baseline cr 1.2-1.4.  not responsive to Lasix.  Tolerating UF well.  At 300/ hr, continue.  CVPs high  3.  Vtach: lido dc, on amio.  No recurrent.  4.  Coagulopathy: Vit K and FFP.  Secondary to #1  5.  Congestive hepatopathy: secondary to #1 as well  6.  Anion gap metabolic acidosis: likely due to elevated lactate.  resolved with CRRT  7.  Hypomag: repleting  8.  Presumed sepsis: cultures, vanc/ ceftaz (11/19-)  9.  Dispo: critically ill, mortality high.  Will continue all aggressive measures.  Subjective:    Febrile.  Empiric abx started.   Objective:   BP (!) 106/56   Pulse 78   Temp 98.2 F (36.8 C)   Resp (!) 25   Ht 5\' 7"  (1.702 m)   Wt 83.2 kg (183 lb 6.8 oz)   SpO2 98%   BMI 28.73 kg/m   Intake/Output Summary (Last 24 hours) at 10/14/16 1132 Last data filed at 10/14/16 1100  Gross per 24 hour  Intake          3237.15 ml  Output             7344 ml  Net         -4106.85 ml   Weight change: -2.7 kg (-5 lb 15.2 oz)  Physical Exam: Physical Exam  GEN: lying in bed, normal WOB HEENT: sclerae muddy, EOMI, PERRL NECK: + JVD to angle of mandible PULM: increased WOB, clear anteriorly but crackles at bases CV tachycardic, S1 S2, parasternal heave, S3 present, can her IABP ABD distended, + liver edge 3-4 cm below costal margin, hypoactive bowel sounds EXT: nonpitting LE edema; tips of toes and fingers cool.  IABP in place. NEURO: AAO x 3 this AM  Imaging: Dg Chest Port 1 View  Result  Date: 10/14/2016 CLINICAL DATA:  Fever, chills. EXAM: PORTABLE CHEST 1 VIEW COMPARISON:  10/08/2016 FINDINGS: Left pacer remains in place, unchanged. Right Swan-Ganz catheter is in place with the tip in the right lower lobe pulmonary artery. There is marked cardiomegaly with vascular congestion. No overt failure. Probable left lower lobe compressive atelectasis. No definite effusion. IMPRESSION: Swan-Ganz catheter in the central right lower lobe pulmonary artery. Marked cardiomegaly and vascular congestion. Electronically Signed   By: Charlett Nose M.D.   On: 10/14/2016 07:24    Labs: BMET  Recent Labs Lab 10/11/16 0422 10/11/16 1210 10/02/2016 0442 09/28/2016 1600 10/13/16 0544 10/13/16 1720 10/14/16 0339  NA 135 129* 129* 127* 128*  129* 128* 130*  K 3.9 3.7 4.9 4.7 5.1  5.0 5.1 5.5*  CL 93* 88* 87* 86* 89*  90* 93* 94*  CO2 32 28 25 28 26  26 24 25   GLUCOSE 252* 355* 147* 148* 150*  146* 97 104*  BUN 24* 25* 34* 40* 34*  33* 26* 25*  CREATININE 1.67* 1.81* 2.90* 3.23* 2.70*  2.66* 2.26* 2.34*  CALCIUM 8.3* 8.4* 8.8* 8.3* 8.2*  8.1* 8.3*  8.4*  PHOS  --   --   --  4.6 4.0 4.1 3.8   CBC  Recent Labs Lab 10/03/2016 0957 09/26/2016 2136 10/13/16 0544 10/14/16 0100  WBC 6.6 5.7 6.0 8.8  NEUTROABS 3.1  --   --   --   HGB 14.7 12.8* 12.9* 13.7  HCT 43.9 36.7* 37.9* 39.7  MCV 87.8 84.4 84.4 85.2  PLT 169 138* 129* 122*    Medications:    . cefTAZidime (FORTAZ)  IV  2 g Intravenous Q12H  . Influenza vac split quadrivalent PF  0.5 mL Intramuscular Tomorrow-1000  . insulin aspart  0-5 Units Subcutaneous QHS  . insulin aspart  0-9 Units Subcutaneous TID WC  . sodium chloride flush  10-40 mL Intracatheter Q12H  . sodium chloride flush  3 mL Intravenous Q12H  . [START ON 10/15/2016] vancomycin  1,000 mg Intravenous Q24H      Bufford ButtnerElizabeth Clemens Lachman MD [facility] Cell [phone] pgr [phone] 10/14/2016, 11:32 AM

## 2016-10-14 NOTE — Progress Notes (Signed)
Patient ID: Parker ChromanJames L Newcombe, male   DOB: [birth year], 50 y.o.   MRN: [id number]    Advanced Heart Failure Rounding Note   Subjective:    Admitted with VT/syncope and profound cardiogenic shock. PICC placed with initial CO-OX 26%.   Underwent IABP and Trialysis cath placement on 11/17  Now on dobutamine 5 and norepi 50  Trying to pull 200/hr on CVVHD, weight down 6 lbs. MAPs 70s currently but febrile to 102 overnight and MAP dropped during fever.  Co-ox 49% this morning.    Febrile to 102, vancomycin/ceftazidime started and cultures sent.  CXR this morning did not show PNA; marked cardiomegaly but no effusion on 11/17 echo.   No further VT.   INR 2.5  Swan this morning: RA  26 PA 36/27 PCWP 30 Thermo CI 1.7 SVR 900    Objective:   Weight Range:  Vital Signs:   Temp:  [95.7 F (35.4 C)-102.2 F (39 C)] 100.9 F (38.3 C) (11/19 0700) Pulse Rate:  [45-139] 51 (11/19 0700) Resp:  [0-32] 21 (11/19 0700) BP: (90-127)/(44-94) 101/70 (11/19 0700) SpO2:  [92 %-100 %] 95 % (11/19 0700) Arterial Line BP: (83-114)/(46-65) 85/53 (11/19 0700) Weight:  [183 lb 6.8 oz (83.2 kg)] 183 lb 6.8 oz (83.2 kg) (11/19 0500) Last BM Date: 02-Aug-2016  Weight change: Filed Weights   10/11/2016 0434 10/13/16 0500 10/14/16 0500  Weight: 188 lb 11.4 oz (85.6 kg) 189 lb 6 oz (85.9 kg) 183 lb 6.8 oz (83.2 kg)    Intake/Output:   Intake/Output Summary (Last 24 hours) at 10/14/16 0824 Last data filed at 10/14/16 0800  Gross per 24 hour  Intake          2820.63 ml  Output             7647 ml  Net         -4826.37 ml     Physical Exam:  General:  Lying in bed. Critically ill HEENT: normal Neck: supple. JVP to ear. RIJ swan . Carotids 2+ bilat; no bruits. No lymphadenopathy or thryomegaly appreciated. Cor: PMI nondisplaced. Regular rate & rhythm. No rubs, or murmurs.+S3 Lungs: Crackles anteriorly Abdomen: soft, nontender, +distended. No hepatosplenomegaly. No bruits or masses. Good bowel  sounds. Extremities: no cyanosis, clubbing, rash  RUE PICC. R and LLE 1+ edema. Ted hose present. R femoral trialysis cath and IABP Neuro: awake. Follows commands cranial nerves grossly intact. moves all 4 extremities w/o difficulty.   Telemetry: SR  Labs: Basic Metabolic Panel:  Recent Labs Lab 10/11/16 0422 10/11/16 1210 10/15/2016 0442 10/16/2016 1600 10/13/16 0544 10/13/16 1720 10/14/16 0339  NA 135 129* 129* 127* 128*  129* 128* 130*  K 3.9 3.7 4.9 4.7 5.1  5.0 5.1 5.5*  CL 93* 88* 87* 86* 89*  90* 93* 94*  CO2 32 28 25 28 26  26 24 25   GLUCOSE 252* 355* 147* 148* 150*  146* 97 104*  BUN 24* 25* 34* 40* 34*  33* 26* 25*  CREATININE 1.67* 1.81* 2.90* 3.23* 2.70*  2.66* 2.26* 2.34*  CALCIUM 8.3* 8.4* 8.8* 8.3* 8.2*  8.1* 8.3* 8.4*  MG 1.5* 2.7* 2.7*  --  2.7*  --  2.7*  PHOS  --   --   --  4.6 4.0 4.1 3.8    Liver Function Tests:  Recent Labs Lab 02-Aug-2016 0957 10/11/16 0422 10/11/2016 0500 10/03/2016 1600 10/13/16 0544 10/13/16 1720 10/14/16 0339  AST 31 62* 287*  --   --   --   --  ALT 16* 39 184*  --   --   --   --   ALKPHOS 88 86 82  --   --   --   --   BILITOT 2.8* 2.8* 2.5*  --   --   --   --   PROT 7.5 6.8 7.4  --   --   --   --   ALBUMIN 3.3* 3.1* 3.3* 3.2* 3.2* 3.3* 3.4*   No results for input(s): LIPASE, AMYLASE in the last 168 hours. No results for input(s): AMMONIA in the last 168 hours.  CBC:  Recent Labs Lab 10-31-2016 0957 10/03/2016 2136 10/13/16 0544 10/14/16 0100  WBC 6.6 5.7 6.0 8.8  NEUTROABS 3.1  --   --   --   HGB 14.7 12.8* 12.9* 13.7  HCT 43.9 36.7* 37.9* 39.7  MCV 87.8 84.4 84.4 85.2  PLT 169 138* 129* 122*    Cardiac Enzymes: No results for input(s): CKTOTAL, CKMB, CKMBINDEX, TROPONINI in the last 168 hours.  BNP: BNP (last 3 results)  Recent Labs  05/16/16 0420 06/21/16 1218 10-31-2016 0957  BNP 2,453.6* 2,504.3* 3,542.3*    ProBNP (last 3 results) No results for input(s): PROBNP in the last 8760  hours.    Other results:  Imaging: Dg Chest Port 1 View  Result Date: 10/14/2016 CLINICAL DATA:  Fever, chills. EXAM: PORTABLE CHEST 1 VIEW COMPARISON:  31-Oct-2016 FINDINGS: Left pacer remains in place, unchanged. Right Swan-Ganz catheter is in place with the tip in the right lower lobe pulmonary artery. There is marked cardiomegaly with vascular congestion. No overt failure. Probable left lower lobe compressive atelectasis. No definite effusion. IMPRESSION: Swan-Ganz catheter in the central right lower lobe pulmonary artery. Marked cardiomegaly and vascular congestion. Electronically Signed   By: Charlett Nose M.D.   On: 10/14/2016 07:24     Medications:     Scheduled Medications: . cefTAZidime (FORTAZ)  IV  2 g Intravenous Q12H  . Influenza vac split quadrivalent PF  0.5 mL Intramuscular Tomorrow-1000  . insulin aspart  0-5 Units Subcutaneous QHS  . insulin aspart  0-9 Units Subcutaneous TID WC  . sodium chloride flush  10-40 mL Intracatheter Q12H  . sodium chloride flush  3 mL Intravenous Q12H  . vancomycin  1,750 mg Intravenous Once  . [START ON 10/15/2016] vancomycin  1,000 mg Intravenous Q24H    Infusions: . amiodarone 60 mg/hr (10/14/16 0800)  . DOBUTamine 4.984 mcg/kg/min (10/14/16 0800)  . heparin 1,250 Units/hr (10/14/16 0800)  . norepinephrine (LEVOPHED) Adult infusion 50.027 mcg/min (10/14/16 0800)  . dialysis replacement fluid (prismasate) 300 mL/hr at 10/14/16 0350  . dialysis replacement fluid (prismasate) 200 mL/hr at 10/13/16 1857  . dialysate (PRISMASATE) 2,200 mL/hr at 10/14/16 0351  . vasopressin (PITRESSIN) infusion - *FOR SHOCK*      PRN Medications: sodium chloride, acetaminophen, heparin, ondansetron (ZOFRAN) IV, sodium chloride, sodium chloride flush, sodium chloride flush, traMADol   Assessment/Plan/Discussion    1. Syncope: VT [employer].  Device interrogation showed multiple episodes of VT. Prior to admit he was supposed to be on amio  daily. We called his pharmacy today and he last picked up prescription for amio in April 2017. Loading amio per EP. K repleted. No further VT over the last day.  2. A/C Systolic Biventricular Heart Failure (end-stage) -> Cardiogenic Shock.  NICM. ECHO this admission EF 10% with severe RV dilation and dysfunction. BNP >3000. On admit he had marked volume overload with prominent S3.  He is still  markedly volume overloaded by today's Swan numbers with low CI and co-ox today.  Currently has IABP with dobutamine 5 and norepinephrine 50.  Getting CVVH.  We were able to pull off 8 lbs by CVVH yesterday but BP not allowing as much now (BP fell with fever). IABP augmenting normally.  - Continue current IABP settings and norepinephrine.  - Increase dobutamine to 7.   - Continue CVVH, pulling fluid as BP tolerates.  - Not candidate for VAD or transplant so have not pursued ECMO.   3. PAF:  Maintaining NSR on amio.  On heparin with IABP.  4. H/O LV thrombus 5. Acute on CKD III:  Anuric. Now on CVVHD (started 11/17).  - Marked volume overload. Continue to pull as BP tolerates.  6. H/O ETOH  7. H/O tobacco abuse 8. Hyperkalemia: CVVH.  9. ID: Fever to 102 overnight.  No PNA on CXR.  Has multiple lines/IABP, for now leave all in place.  Sent cultures this morning and starting empiric vancomycin/ceftazidime.   He remains critically. Continue full support with IABP, pressors, CVVHD. Prognosis extremely guarded. Not candidate for ECMO and VAD.    The patient is critically ill with multiple organ systems failure and requires high complexity decision making for assessment and support, frequent evaluation and titration of therapies, application of advanced monitoring technologies and extensive interpretation of multiple databases.   Critical Care Time devoted to patient care services described in this note is 40 Minutes.  Length of Stay: 4  Marca Ancona MD 10/14/2016, 8:24 AM  Advanced Heart Failure  Team Pager [phone] (M-F; 7a - 4p)  Please contact [facility] for night-coverage after hours (4p -7a ) and weekends on [email or web address]

## 2016-10-15 ENCOUNTER — Ambulatory Visit (HOSPITAL_COMMUNITY): Payer: Medicaid Other

## 2016-10-15 DIAGNOSIS — Z95828 Presence of other vascular implants and grafts: Secondary | ICD-10-CM

## 2016-10-15 DIAGNOSIS — Z992 Dependence on renal dialysis: Secondary | ICD-10-CM

## 2016-10-15 DIAGNOSIS — I5022 Chronic systolic (congestive) heart failure: Secondary | ICD-10-CM

## 2016-10-15 DIAGNOSIS — A4901 Methicillin susceptible Staphylococcus aureus infection, unspecified site: Secondary | ICD-10-CM

## 2016-10-15 DIAGNOSIS — I110 Hypertensive heart disease with heart failure: Secondary | ICD-10-CM

## 2016-10-15 DIAGNOSIS — I480 Paroxysmal atrial fibrillation: Secondary | ICD-10-CM

## 2016-10-15 DIAGNOSIS — Z833 Family history of diabetes mellitus: Secondary | ICD-10-CM

## 2016-10-15 DIAGNOSIS — Z8249 Family history of ischemic heart disease and other diseases of the circulatory system: Secondary | ICD-10-CM

## 2016-10-15 DIAGNOSIS — R509 Fever, unspecified: Secondary | ICD-10-CM

## 2016-10-15 DIAGNOSIS — Z7189 Other specified counseling: Secondary | ICD-10-CM

## 2016-10-15 DIAGNOSIS — Z7901 Long term (current) use of anticoagulants: Secondary | ICD-10-CM

## 2016-10-15 DIAGNOSIS — F17210 Nicotine dependence, cigarettes, uncomplicated: Secondary | ICD-10-CM

## 2016-10-15 LAB — RENAL FUNCTION PANEL
ALBUMIN: 3 g/dL — AB (ref 3.5–5.0)
ANION GAP: 12 (ref 5–15)
ANION GAP: 11 (ref 5–15)
Albumin: 3.3 g/dL — ABNORMAL LOW (ref 3.5–5.0)
BUN: 20 mg/dL (ref 6–20)
BUN: 14 mg/dL (ref 6–20)
CALCIUM: 8 mg/dL — AB (ref 8.9–10.3)
CHLORIDE: 97 mmol/L — AB (ref 101–111)
CO2: 23 mmol/L (ref 22–32)
CO2: 25 mmol/L (ref 22–32)
CREATININE: 2.23 mg/dL — AB (ref 0.61–1.24)
CREATININE: 1.38 mg/dL — AB (ref 0.61–1.24)
Calcium: 8.4 mg/dL — ABNORMAL LOW (ref 8.9–10.3)
Chloride: 94 mmol/L — ABNORMAL LOW (ref 101–111)
GFR calc non Af Amer: 59 mL/min — ABNORMAL LOW (ref >60)
GFR, EST AFRICAN AMERICAN: 38 mL/min — AB (ref >60)
GFR, EST NON AFRICAN AMERICAN: 33 mL/min — AB (ref >60)
GLUCOSE: 133 mg/dL — AB (ref 65–99)
Glucose, Bld: 145 mg/dL — ABNORMAL HIGH (ref 65–99)
PHOSPHORUS: 3.1 mg/dL (ref 2.5–4.6)
Phosphorus: 1.8 mg/dL — ABNORMAL LOW (ref 2.5–4.6)
Potassium: 4.2 mmol/L (ref 3.5–5.1)
Potassium: 4.2 mmol/L (ref 3.5–5.1)
SODIUM: 129 mmol/L — AB (ref 135–145)
Sodium: 133 mmol/L — ABNORMAL LOW (ref 135–145)

## 2016-10-15 LAB — CBC
HCT: 40.7 % (ref 39.0–52.0)
Hemoglobin: 14.1 g/dL (ref 13.0–17.0)
MCH: 28.8 pg (ref 26.0–34.0)
MCHC: 34.6 g/dL (ref 30.0–36.0)
MCV: 83.2 fL (ref 78.0–100.0)
PLATELETS: 102 10*3/uL — AB (ref 150–400)
RBC: 4.89 MIL/uL (ref 4.22–5.81)
RDW: 20.5 % — AB (ref 11.5–15.5)
WBC: 16.4 10*3/uL — AB (ref 4.0–10.5)

## 2016-10-15 LAB — GLUCOSE, CAPILLARY
GLUCOSE-CAPILLARY: 108 mg/dL — AB (ref 65–99)
Glucose-Capillary: 132 mg/dL — ABNORMAL HIGH (ref 65–99)
Glucose-Capillary: 137 mg/dL — ABNORMAL HIGH (ref 65–99)

## 2016-10-15 LAB — LACTIC ACID, PLASMA: LACTIC ACID, VENOUS: 2 mmol/L — AB (ref 0.5–1.9)

## 2016-10-15 LAB — PROTIME-INR
INR: 3.14
PROTHROMBIN TIME: 33 s — AB (ref 11.4–15.2)

## 2016-10-15 LAB — HEPARIN LEVEL (UNFRACTIONATED): Heparin Unfractionated: 0.2 IU/mL — ABNORMAL LOW (ref 0.30–0.70)

## 2016-10-15 LAB — COOXEMETRY PANEL
Carboxyhemoglobin: 1.1 % (ref 0.5–1.5)
METHEMOGLOBIN: 0.7 % (ref 0.0–1.5)
O2 Saturation: 47.3 %
TOTAL HEMOGLOBIN: 14.5 g/dL (ref 12.0–16.0)

## 2016-10-15 LAB — MAGNESIUM: MAGNESIUM: 2.5 mg/dL — AB (ref 1.7–2.4)

## 2016-10-15 MED ORDER — PRISMASOL BGK 4/2.5 32-4-2.5 MEQ/L IV SOLN
INTRAVENOUS | Status: DC
  Administered 2016-10-15 – 2016-10-21 (×8): via INTRAVENOUS_CENTRAL
  Filled 2016-10-15 (×9): qty 5000

## 2016-10-15 MED ORDER — SODIUM CHLORIDE 0.9 % IV SOLN
INTRAVENOUS | Status: DC
  Administered 2016-10-15: 04:00:00 via INTRAVENOUS

## 2016-10-15 MED ORDER — INFLUENZA VAC SPLIT QUAD 0.5 ML IM SUSY: 0.5000 mL | PREFILLED_SYRINGE | INTRAMUSCULAR | Status: AC | PRN

## 2016-10-15 MED ORDER — PRISMASOL BGK 4/2.5 32-4-2.5 MEQ/L IV SOLN
INTRAVENOUS | Status: DC
  Administered 2016-10-15 – 2016-10-16 (×2): via INTRAVENOUS_CENTRAL
  Filled 2016-10-15 (×2): qty 5000

## 2016-10-15 NOTE — Consult Note (Addendum)
[facility]  Total days of antibiotics 2        Day 2 cefazolin               Reason for Consult: MSSA bacteremia    Referring Physician: Caryl Comes  Active Problems:   Ventricular fibrillation ([place])   Cardiogenic shock ([place])   AKI (acute kidney injury) ([place])    HPI: Parker Johnson is a 50 y.o. male a very kind gentleman with history of NICM with chronic systolic HF, EF 97% in June 2017, s/p AICD, h/o LV thrombus, PAF on anticoagulation, HTN, admitted on 11/15 for cardiolgenic shock, oliguric kidney injury on 11/15 where he reported worsening SOB, near syncope, peripheral edema and abdominal distention not improved with increasing doses of torsemide. Device interrogation showed short runs of VT. On admit, he had PICC line placed for milrinone and NE. He required amiodarone drip and lidocaine to help suppress ventricular tachycardia. He had worsening renal function as well as elevated LA as a function of his worsening cardiogenic shock. He was had IABP placed, trialysis line placed for CRRT, and swann-ganz catheter on 11/17. After IABP placement, he remained on dobutamine 5 and NE 40 with CVVHD in place, and started to feel better, at least better mentation and no recurrent of VT. In the early morning of 11/19, the patient was symptomatic with fever of 100.9, his WBC had not increased significantly. He had cultures drawn at that time, he later had temp of 102F at 5 am. He was started on vanco and ceftaz empirically, while other infectious work up came in place. His CXR was not suggestive of pneumonia per my read. His Blood cx later identified MSSA by [facility]. His abtx were narrowed to cefazolin on 11/20. His WBc on 11/20 increased to 16.4. tmax of 99.1 this am. He remains afebrile throughout the day. Tolerating dialysis, balloon pump. He remains anuric. He denies fever or discomfort, he mentions " the lord will know what is best for me"  He denies having any fevers, chills,  nightsweats or sick contacts prior to admit  Past Medical History:  Diagnosis Date  . AICD (automatic cardioverter/defibrillator) present    [employer] 100  . Alcohol abuse    hx  . Cardiomyopathy    secondary  . CHF (congestive heart failure) ([place])   . Chronic anticoagulation    Coumadin for hx LV thrombus and PAF  . Gout   . HTN (hypertension)    uncontrolled  . Tobacco abuse   . Ventricular tachycardia ([facility])     Allergies: No Known Allergies   MEDICATIONS: .  ceFAZolin (ANCEF) IV  2 g Intravenous Q12H  . insulin aspart  0-5 Units Subcutaneous QHS  . insulin aspart  0-9 Units Subcutaneous TID WC  . sodium chloride flush  10-40 mL Intracatheter Q12H  . sodium chloride flush  3 mL Intravenous Q12H    Social History  Substance Use Topics  . Smoking status: Current Some Day Smoker    Packs/day: 0.50    Years: 30.00    Types: Cigarettes  . Smokeless tobacco: Never Used     Comment: I already have information on quitting  . Alcohol use 0.6 oz/week    1 Cans of beer per week     Comment: None in 3 months    Family History  Problem Relation Age of Onset  . Cardiomyopathy Father     on a defib  . Diabetes Father  Review of Systems - Constitutional: + fatigue. Negative for fever, chills, diaphoresis, activity change, appetite change, fatigue and unexpected weight change.  HENT: Negative for congestion, sore throat, rhinorrhea, sneezing, trouble swallowing and sinus pressure.  Eyes: Negative for photophobia and visual disturbance.  Respiratory: Negative for cough, chest tightness, shortness of breath, wheezing and stridor.  Cardiovascular: Negative for chest pain, palpitations and leg swelling.  Gastrointestinal: Negative for nausea, vomiting, abdominal pain, diarrhea, constipation, blood in stool, abdominal distention and anal bleeding.  Genitourinary: + no urine output. Negative for dysuria, hematuria, flank pain Musculoskeletal: Negative for  myalgias, back pain, joint swelling, arthralgias and gait problem.  Skin: Negative for color change, pallor, rash and wound.  Neurological: Negative for dizziness, tremors, weakness and light-headedness.  Hematological: Negative for adenopathy. Does not bruise/bleed easily.  Psychiatric/Behavioral: Negative for behavioral problems, confusion, sleep disturbance, dysphoric mood, decreased concentration and agitation.     OBJECTIVE: Temp:  [95.9 F (35.5 C)-99.1 F (37.3 C)] 99.1 F (37.3 C) (11/20 1200) Pulse Rate:  [37-164] 78 (11/20 1200) Resp:  [13-33] 33 (11/20 1200) BP: (91-127)/(34-71) 103/53 (11/20 1200) SpO2:  [97 %-100 %] 99 % (11/20 1200) Arterial Line BP: (91-134)/(47-73) 103/53 (11/20 1200) Weight:  [177 lb 7.5 oz (80.5 kg)] 177 lb 7.5 oz (80.5 kg) (11/20 0238) Physical Exam  Constitutional: He is oriented to person, place, and time. He appears well-developed and well-nourished. No distress.  HENT:  Mouth/Throat: Oropharynx is clear and moist. No oropharyngeal exudate.  Cardiovascular: Normal rate, regular rhythm and normal heart sounds. Mechanical hum heard from balloon pump. No murmur heard.  Neck: cordis/S-G catheter in place Pulmonary/Chest: Effort normal and breath sounds normal. No respiratory distress. He has no wheezes.  Abdominal: Soft. Bowel sounds are normal. He exhibits no distension. There is no tenderness. Right HD fem catheter in place Ext: picc line and art line in right arm, warm extremities Neurological: He is alert and oriented to person, place, and time.  Skin: Skin is warm and dry. No rash noted. No erythema.  Psychiatric: He has a normal mood and affect. His behavior is normal.    LABS: Results for orders placed or performed during the hospital encounter of 10/25/2016 (from the past 48 hour(s))  Glucose, capillary     Status: Abnormal   Collection Time: 10/13/16  4:37 PM  Result Value Ref Range   Glucose-Capillary 103 (H) 65 - 99 mg/dL  Renal  function panel (daily at 1600)     Status: Abnormal   Collection Time: 10/13/16  5:20 PM  Result Value Ref Range   Sodium 128 (L) 135 - 145 mmol/L   Potassium 5.1 3.5 - 5.1 mmol/L   Chloride 93 (L) 101 - 111 mmol/L   CO2 24 22 - 32 mmol/L   Glucose, Bld 97 65 - 99 mg/dL   BUN 26 (H) 6 - 20 mg/dL   Creatinine, Ser 2.26 (H) 0.61 - 1.24 mg/dL   Calcium 8.3 (L) 8.9 - 10.3 mg/dL   Phosphorus 4.1 2.5 - 4.6 mg/dL   Albumin 3.3 (L) 3.5 - 5.0 g/dL   GFR calc non Af Amer 32 (L) >60 mL/min   GFR calc Af Amer 37 (L) >60 mL/min    Comment: (NOTE) The eGFR has been calculated using the CKD EPI equation. This calculation has not been validated in all clinical situations. eGFR's persistently <60 mL/min signify possible Chronic Kidney Disease.    Anion gap 11 5 - 15  Heparin level (unfractionated)     Status:  Abnormal   Collection Time: 10/13/16  5:20 PM  Result Value Ref Range   Heparin Unfractionated <0.10 (L) 0.30 - 0.70 IU/mL    Comment:        IF HEPARIN RESULTS ARE BELOW EXPECTED VALUES, AND PATIENT DOSAGE HAS BEEN CONFIRMED, SUGGEST FOLLOW UP TESTING OF ANTITHROMBIN III LEVELS.   Glucose, capillary     Status: Abnormal   Collection Time: 10/13/16  9:38 PM  Result Value Ref Range   Glucose-Capillary 130 (H) 65 - 99 mg/dL  Heparin level (unfractionated)     Status: Abnormal   Collection Time: 10/14/16  1:00 AM  Result Value Ref Range   Heparin Unfractionated 0.27 (L) 0.30 - 0.70 IU/mL    Comment:        IF HEPARIN RESULTS ARE BELOW EXPECTED VALUES, AND PATIENT DOSAGE HAS BEEN CONFIRMED, SUGGEST FOLLOW UP TESTING OF ANTITHROMBIN III LEVELS.   CBC     Status: Abnormal   Collection Time: 10/14/16  1:00 AM  Result Value Ref Range   WBC 8.8 4.0 - 10.5 K/uL   RBC 4.66 4.22 - 5.81 MIL/uL   Hemoglobin 13.7 13.0 - 17.0 g/dL   HCT 39.7 39.0 - 52.0 %   MCV 85.2 78.0 - 100.0 fL   MCH 29.4 26.0 - 34.0 pg   MCHC 34.5 30.0 - 36.0 g/dL   RDW 20.4 (H) 11.5 - 15.5 %   Platelets 122 (L)  150 - 400 K/uL  Protime-INR     Status: Abnormal   Collection Time: 10/14/16  1:00 AM  Result Value Ref Range   Prothrombin Time 27.4 (H) 11.4 - 15.2 seconds   INR 2.50   Renal function panel (daily at 0500)     Status: Abnormal   Collection Time: 10/14/16  3:39 AM  Result Value Ref Range   Sodium 130 (L) 135 - 145 mmol/L   Potassium 5.5 (H) 3.5 - 5.1 mmol/L   Chloride 94 (L) 101 - 111 mmol/L   CO2 25 22 - 32 mmol/L   Glucose, Bld 104 (H) 65 - 99 mg/dL   BUN 25 (H) 6 - 20 mg/dL   Creatinine, Ser 2.34 (H) 0.61 - 1.24 mg/dL   Calcium 8.4 (L) 8.9 - 10.3 mg/dL   Phosphorus 3.8 2.5 - 4.6 mg/dL   Albumin 3.4 (L) 3.5 - 5.0 g/dL   GFR calc non Af Amer 31 (L) >60 mL/min   GFR calc Af Amer 36 (L) >60 mL/min    Comment: (NOTE) The eGFR has been calculated using the CKD EPI equation. This calculation has not been validated in all clinical situations. eGFR's persistently <60 mL/min signify possible Chronic Kidney Disease.    Anion gap 11 5 - 15  Magnesium     Status: Abnormal   Collection Time: 10/14/16  3:39 AM  Result Value Ref Range   Magnesium 2.7 (H) 1.7 - 2.4 mg/dL  Cooxemetry Panel (carboxy, met, total hgb, O2 sat)     Status: None   Collection Time: 10/14/16  3:40 AM  Result Value Ref Range   Total hemoglobin 13.3 12.0 - 16.0 g/dL   O2 Saturation 30.4 %   Carboxyhemoglobin 0.7 0.5 - 1.5 %   Methemoglobin 0.8 0.0 - 1.5 %  Lactic acid, plasma     Status: None   Collection Time: 10/14/16  4:16 AM  Result Value Ref Range   Lactic Acid, Venous 1.6 0.5 - 1.9 mmol/L  Culture, blood (Routine X 2) w Reflex to ID Panel  Status: Abnormal (Preliminary result)   Collection Time: 10/14/16  4:50 AM  Result Value Ref Range   Specimen Description BLOOD RIGHT HAND    Special Requests IN PEDIATRIC BOTTLE 3CC    Culture  Setup Time      GRAM POSITIVE COCCI IN CLUSTERS IN PEDIATRIC BOTTLE CRITICAL RESULT CALLED TO, READ BACK BY AND VERIFIED WITH: M TURNER,PHARMD AT 1953 10/14/16 BY L  BENFIELD    Culture (A)     STAPHYLOCOCCUS AUREUS SUSCEPTIBILITIES TO FOLLOW    Report Status PENDING   Blood Culture ID Panel (Reflexed)     Status: Abnormal   Collection Time: 10/14/16  4:50 AM  Result Value Ref Range   Enterococcus species NOT DETECTED NOT DETECTED   Listeria monocytogenes NOT DETECTED NOT DETECTED   Staphylococcus species DETECTED (A) NOT DETECTED    Comment: CRITICAL RESULT CALLED TO, READ BACK BY AND VERIFIED WITH: M TURNER,PHARMD AT 1953 10/14/16 BY L BENFIELD    Staphylococcus aureus DETECTED (A) NOT DETECTED    Comment: CRITICAL RESULT CALLED TO, READ BACK BY AND VERIFIED WITH: M TURNER,PHARMD AT 1953 10/14/16 BY L BENFIELD    Methicillin resistance NOT DETECTED NOT DETECTED   Streptococcus species NOT DETECTED NOT DETECTED   Streptococcus agalactiae NOT DETECTED NOT DETECTED   Streptococcus pneumoniae NOT DETECTED NOT DETECTED   Streptococcus pyogenes NOT DETECTED NOT DETECTED   Acinetobacter baumannii NOT DETECTED NOT DETECTED   Enterobacteriaceae species NOT DETECTED NOT DETECTED   Enterobacter cloacae complex NOT DETECTED NOT DETECTED   Escherichia coli NOT DETECTED NOT DETECTED   Klebsiella oxytoca NOT DETECTED NOT DETECTED   Klebsiella pneumoniae NOT DETECTED NOT DETECTED   Proteus species NOT DETECTED NOT DETECTED   Serratia marcescens NOT DETECTED NOT DETECTED   Haemophilus influenzae NOT DETECTED NOT DETECTED   Neisseria meningitidis NOT DETECTED NOT DETECTED   Pseudomonas aeruginosa NOT DETECTED NOT DETECTED   Candida albicans NOT DETECTED NOT DETECTED   Candida glabrata NOT DETECTED NOT DETECTED   Candida krusei NOT DETECTED NOT DETECTED   Candida parapsilosis NOT DETECTED NOT DETECTED   Candida tropicalis NOT DETECTED NOT DETECTED  Culture, blood (Routine X 2) w Reflex to ID Panel     Status: Abnormal (Preliminary result)   Collection Time: 10/14/16  4:58 AM  Result Value Ref Range   Specimen Description BLOOD LEFT HAND    Special  Requests IN PEDIATRIC BOTTLE 2CC    Culture  Setup Time      GRAM POSITIVE COCCI IN CLUSTERS IN PEDIATRIC BOTTLE TURNER,PHARMD AT 1953 10/14/16 BY L BENFIELD CRITICAL RESULT CALLED TO, READ BACK BY AND VERIFIED WITH:    Culture STAPHYLOCOCCUS AUREUS (A)    Report Status PENDING   .Cooxemetry Panel (carboxy, met, total hgb, O2 sat)     Status: Abnormal   Collection Time: 10/14/16  5:37 AM  Result Value Ref Range   Total hemoglobin 10.5 (L) 12.0 - 16.0 g/dL   O2 Saturation 48.5 %   Carboxyhemoglobin 0.9 0.5 - 1.5 %   Methemoglobin 0.9 0.0 - 1.5 %  Glucose, capillary     Status: None   Collection Time: 10/14/16  7:39 AM  Result Value Ref Range   Glucose-Capillary 84 65 - 99 mg/dL  Heparin level (unfractionated)     Status: Abnormal   Collection Time: 10/14/16 11:35 AM  Result Value Ref Range   Heparin Unfractionated 0.29 (L) 0.30 - 0.70 IU/mL    Comment:        IF HEPARIN  RESULTS ARE BELOW EXPECTED VALUES, AND PATIENT DOSAGE HAS BEEN CONFIRMED, SUGGEST FOLLOW UP TESTING OF ANTITHROMBIN III LEVELS.   Lactic acid, plasma     Status: Abnormal   Collection Time: 10/14/16 12:02 PM  Result Value Ref Range   Lactic Acid, Venous 2.2 (HH) 0.5 - 1.9 mmol/L    Comment: CRITICAL RESULT CALLED TO, READ BACK BY AND VERIFIED WITH: CLEAVERJRN [id number] MCCAULEG   Glucose, capillary     Status: Abnormal   Collection Time: 10/14/16 12:06 PM  Result Value Ref Range   Glucose-Capillary 128 (H) 65 - 99 mg/dL  Glucose, capillary     Status: Abnormal   Collection Time: 10/14/16  4:18 PM  Result Value Ref Range   Glucose-Capillary 117 (H) 65 - 99 mg/dL  Renal function panel (daily at 1600)     Status: Abnormal   Collection Time: 10/14/16  4:20 PM  Result Value Ref Range   Sodium 130 (L) 135 - 145 mmol/L   Potassium 4.8 3.5 - 5.1 mmol/L   Chloride 95 (L) 101 - 111 mmol/L   CO2 24 22 - 32 mmol/L   Glucose, Bld 108 (H) 65 - 99 mg/dL   BUN 22 (H) 6 - 20 mg/dL   Creatinine, Ser 2.32 (H) 0.61  - 1.24 mg/dL   Calcium 8.0 (L) 8.9 - 10.3 mg/dL   Phosphorus 3.5 2.5 - 4.6 mg/dL   Albumin 3.0 (L) 3.5 - 5.0 g/dL   GFR calc non Af Amer 31 (L) >60 mL/min   GFR calc Af Amer 36 (L) >60 mL/min    Comment: (NOTE) The eGFR has been calculated using the CKD EPI equation. This calculation has not been validated in all clinical situations. eGFR's persistently <60 mL/min signify possible Chronic Kidney Disease.    Anion gap 11 5 - 15  Glucose, capillary     Status: Abnormal   Collection Time: 10/14/16  9:37 PM  Result Value Ref Range   Glucose-Capillary 144 (H) 65 - 99 mg/dL  Renal function panel (daily at 0500)     Status: Abnormal   Collection Time: 10/15/16  5:37 AM  Result Value Ref Range   Sodium 129 (L) 135 - 145 mmol/L   Potassium 4.2 3.5 - 5.1 mmol/L   Chloride 94 (L) 101 - 111 mmol/L   CO2 23 22 - 32 mmol/L   Glucose, Bld 145 (H) 65 - 99 mg/dL   BUN 20 6 - 20 mg/dL   Creatinine, Ser 2.23 (H) 0.61 - 1.24 mg/dL   Calcium 8.0 (L) 8.9 - 10.3 mg/dL   Phosphorus 3.1 2.5 - 4.6 mg/dL   Albumin 3.0 (L) 3.5 - 5.0 g/dL   GFR calc non Af Amer 33 (L) >60 mL/min   GFR calc Af Amer 38 (L) >60 mL/min    Comment: (NOTE) The eGFR has been calculated using the CKD EPI equation. This calculation has not been validated in all clinical situations. eGFR's persistently <60 mL/min signify possible Chronic Kidney Disease.    Anion gap 12 5 - 15  Heparin level (unfractionated)     Status: Abnormal   Collection Time: 10/15/16  5:37 AM  Result Value Ref Range   Heparin Unfractionated 0.20 (L) 0.30 - 0.70 IU/mL    Comment:        IF HEPARIN RESULTS ARE BELOW EXPECTED VALUES, AND PATIENT DOSAGE HAS BEEN CONFIRMED, SUGGEST FOLLOW UP TESTING OF ANTITHROMBIN III LEVELS.   CBC     Status: Abnormal   Collection Time:  10/15/16  5:37 AM  Result Value Ref Range   WBC 16.4 (H) 4.0 - 10.5 K/uL   RBC 4.89 4.22 - 5.81 MIL/uL   Hemoglobin 14.1 13.0 - 17.0 g/dL   HCT 40.7 39.0 - 52.0 %   MCV 83.2 78.0  - 100.0 fL   MCH 28.8 26.0 - 34.0 pg   MCHC 34.6 30.0 - 36.0 g/dL   RDW 20.5 (H) 11.5 - 15.5 %   Platelets 102 (L) 150 - 400 K/uL    Comment: PLATELET COUNT CONFIRMED BY SMEAR  Protime-INR     Status: Abnormal   Collection Time: 10/15/16  5:37 AM  Result Value Ref Range   Prothrombin Time 33.0 (H) 11.4 - 15.2 seconds   INR 3.14   Magnesium     Status: Abnormal   Collection Time: 10/15/16  5:37 AM  Result Value Ref Range   Magnesium 2.5 (H) 1.7 - 2.4 mg/dL  Cooxemetry Panel (carboxy, met, total hgb, O2 sat)     Status: None   Collection Time: 10/15/16  5:40 AM  Result Value Ref Range   Total hemoglobin 14.5 12.0 - 16.0 g/dL   O2 Saturation 47.3 %   Carboxyhemoglobin 1.1 0.5 - 1.5 %   Methemoglobin 0.7 0.0 - 1.5 %  Glucose, capillary     Status: Abnormal   Collection Time: 10/15/16  8:53 AM  Result Value Ref Range   Glucose-Capillary 132 (H) 65 - 99 mg/dL  Glucose, capillary     Status: Abnormal   Collection Time: 10/15/16 12:37 PM  Result Value Ref Range   Glucose-Capillary 108 (H) 65 - 99 mg/dL    MICRO: 11/19 blood cx MSSA IMAGING: TTE 11/17:------------------------------------------------------------------- Study Conclusions  - Left ventricle: The cavity size was severely dilated. Systolic   function was severely reduced. The estimated ejection fraction   was in the range of 5% to 10%. - Ventricular septum: The contour showed diastolic flattening. - Mitral valve: There was moderate regurgitation. - Left atrium: The atrium was severely dilated. - Right ventricle: The cavity size was severely dilated. Systolic   function was severely reduced. - Right atrium: The atrium was massively dilated. - Tricuspid valve: There was moderate-severe regurgitation. - Pulmonary arteries: Systolic pressure was increased. Dg Chest Port 1 View  Result Date: 10/14/2016 CLINICAL DATA:  Fever, chills. EXAM: PORTABLE CHEST 1 VIEW COMPARISON:  10/04/2016 FINDINGS: Left pacer remains in  place, unchanged. Right Swan-Ganz catheter is in place with the tip in the right lower lobe pulmonary artery. There is marked cardiomegaly with vascular congestion. No overt failure. Probable left lower lobe compressive atelectasis. No definite effusion. IMPRESSION: Swan-Ganz catheter in the central right lower lobe pulmonary artery. Marked cardiomegaly and vascular congestion. Electronically Signed   By: Rolm Baptise M.D.   On: 10/14/2016 07:24   Assessment/Plan:  51yo M with NICM admitted for severe cardiogenic shock requiring vasopressors, inotropes, IABP, developing AKI, now on dialysis who has had multiple lines placed out of necessity including PICC, trialysis line, swann-ganz, arterial line and IABP to treat cardiogenic shock now complicated with MSSA bacteremia. His course suggests that he either may have just started to develop infection as he was admitted vs. Nosocomial acquisition from multiple line procedures  - recommend to keep on cefazolin to treat MSSA bacteremia - he is currently dependent on all vascular access. once stabilized, would try to exchange any lines possible? picc line or switch sides /Discontinue unnecessary access. - recommend to repeat blood cx tomorrow to see if  he is clearing bacteremia - he had TTE on 11/17 prior to bacteremia onset that did not show any evidence of vegetation - will continue to monitor daily  aki on HD = will continue to renally dose cefazolin  Fevers = continue to monitor temp. If febrile, recommend to repeat blood cx  [facility] [phone]

## 2016-10-15 NOTE — Progress Notes (Addendum)
Patient ID: Parker Johnson, male   DOB: 1966-10-30, 50 y.o.   MRN: [id number]    Advanced Heart Failure Rounding Note   Subjective:    Admitted with VT/syncope and profound cardiogenic shock. PICC placed with initial CO-OX 26%.   Underwent IABP and Trialysis cath placement on 11/17  Now on dobutamine 5 and norepi 50. IV amio.   On 11/19 developed sepsis. Bcx + for MSSA. Now on Ancef.   No further VT. Feels better. Denies dyspnea. Appetite ok. CVVHD pulling 200/hr. Co-ox 47%  INR 3.1  Swan this morning: RA  28 PA 34/27 PCWP 28 Thermo CI 1.7 SVR 1761    Objective:   Weight Range:  Vital Signs:   Temp:  [95.9 F (35.5 C)-99.1 F (37.3 C)] 97.2 F (36.2 C) (11/20 0800) Pulse Rate:  [37-173] 79 (11/20 0800) Resp:  [13-28] 16 (11/20 0800) BP: (91-127)/(47-82) 124/71 (11/20 0800) SpO2:  [95 %-100 %] 100 % (11/20 0800) Arterial Line BP: (91-134)/(47-73) 124/71 (11/20 0800) Weight:  [80.5 kg (177 lb 7.5 oz)] 80.5 kg (177 lb 7.5 oz) (11/20 0238) Last BM Date: 10/02/2016  Weight change: Filed Weights   10/13/16 0500 10/14/16 0500 10/15/16 0238  Weight: 85.9 kg (189 lb 6 oz) 83.2 kg (183 lb 6.8 oz) 80.5 kg (177 lb 7.5 oz)    Intake/Output:   Intake/Output Summary (Last 24 hours) at 10/15/16 0840 Last data filed at 10/15/16 0800  Gross per 24 hour  Intake          3320.26 ml  Output             7270 ml  Net         -3949.74 ml     Physical Exam:  General:  Lying in bed. Critically ill HEENT: normal Neck: supple. JVP to ear. RIJ swan . Carotids 2+ bilat; no bruits. No lymphadenopathy or thryomegaly appreciated. Cor: PMI nondisplaced. Regular rate & rhythm. No rubs, or murmurs.+S3 Lungs: Crackles anteriorly Abdomen: soft, nontender, +distended. No hepatosplenomegaly. No bruits or masses. Good bowel sounds. Extremities: no cyanosis, clubbing, rash  RUE PICC. R and LLE 1+ edema. Ted hose present. R femoral trialysis cath and IABP Neuro: awake. Follows commands cranial  nerves grossly intact. moves all 4 extremities w/o difficulty.   Telemetry: SR  Labs: Basic Metabolic Panel:  Recent Labs Lab 10/11/16 1210 Oct 13, 2016 0442  10/13/16 0544 10/13/16 1720 10/14/16 0339 10/14/16 1620 10/15/16 0537  NA 129* 129*  < > 128*  129* 128* 130* 130* 129*  K 3.7 4.9  < > 5.1  5.0 5.1 5.5* 4.8 4.2  CL 88* 87*  < > 89*  90* 93* 94* 95* 94*  CO2 28 25  < > 26  26 24 25 24 23   GLUCOSE 355* 147*  < > 150*  146* 97 104* 108* 145*  BUN 25* 34*  < > 34*  33* 26* 25* 22* 20  CREATININE 1.81* 2.90*  < > 2.70*  2.66* 2.26* 2.34* 2.32* 2.23*  CALCIUM 8.4* 8.8*  < > 8.2*  8.1* 8.3* 8.4* 8.0* 8.0*  MG 2.7* 2.7*  --  2.7*  --  2.7*  --  2.5*  PHOS  --   --   < > 4.0 4.1 3.8 3.5 3.1  < > = values in this interval not displayed.  Liver Function Tests:  Recent Labs Lab 09/29/2016 0957 10/11/16 0422 13-Oct-2016 0500  10/13/16 0544 10/13/16 1720 10/14/16 0339 10/14/16 1620 10/15/16 0537  AST 31  62* 287*  --   --   --   --   --   --   ALT 16* 39 184*  --   --   --   --   --   --   ALKPHOS 88 86 82  --   --   --   --   --   --   BILITOT 2.8* 2.8* 2.5*  --   --   --   --   --   --   PROT 7.5 6.8 7.4  --   --   --   --   --   --   ALBUMIN 3.3* 3.1* 3.3*  < > 3.2* 3.3* 3.4* 3.0* 3.0*  < > = values in this interval not displayed. No results for input(s): LIPASE, AMYLASE in the last 168 hours. No results for input(s): AMMONIA in the last 168 hours.  CBC:  Recent Labs Lab 10/15/2016 0957 09/30/2016 2136 10/13/16 0544 10/14/16 0100 10/15/16 0537  WBC 6.6 5.7 6.0 8.8 16.4*  NEUTROABS 3.1  --   --   --   --   HGB 14.7 12.8* 12.9* 13.7 14.1  HCT 43.9 36.7* 37.9* 39.7 40.7  MCV 87.8 84.4 84.4 85.2 83.2  PLT 169 138* 129* 122* 102*    Cardiac Enzymes: No results for input(s): CKTOTAL, CKMB, CKMBINDEX, TROPONINI in the last 168 hours.  BNP: BNP (last 3 results)  Recent Labs  05/16/16 0420 06/21/16 1218 10/20/2016 0957  BNP 2,453.6* 2,504.3* 3,542.3*     ProBNP (last 3 results) No results for input(s): PROBNP in the last 8760 hours.    Other results:  Imaging: Dg Chest Port 1 View  Result Date: 10/14/2016 CLINICAL DATA:  Fever, chills. EXAM: PORTABLE CHEST 1 VIEW COMPARISON:  10/08/2016 FINDINGS: Left pacer remains in place, unchanged. Right Swan-Ganz catheter is in place with the tip in the right lower lobe pulmonary artery. There is marked cardiomegaly with vascular congestion. No overt failure. Probable left lower lobe compressive atelectasis. No definite effusion. IMPRESSION: Swan-Ganz catheter in the central right lower lobe pulmonary artery. Marked cardiomegaly and vascular congestion. Electronically Signed   By: Charlett Nose M.D.   On: 10/14/2016 07:24     Medications:     Scheduled Medications: .  ceFAZolin (ANCEF) IV  2 g Intravenous Q12H  . insulin aspart  0-5 Units Subcutaneous QHS  . insulin aspart  0-9 Units Subcutaneous TID WC  . sodium chloride flush  10-40 mL Intracatheter Q12H  . sodium chloride flush  3 mL Intravenous Q12H    Infusions: . sodium chloride 10 mL/hr at 10/15/16 0800  . amiodarone 60 mg/hr (10/15/16 0800)  . DOBUTamine 5 mcg/kg/min (10/15/16 1610)  . heparin 1,250 Units/hr (10/15/16 0800)  . norepinephrine (LEVOPHED) Adult infusion 54 mcg/min (10/15/16 0811)  . dialysis replacement fluid (prismasate) 300 mL/hr at 10/15/16 0158  . dialysis replacement fluid (prismasate) 200 mL/hr at 10/14/16 0857  . dialysate (PRISMASATE) 2,200 mL/hr at 10/15/16 0828  . vasopressin (PITRESSIN) infusion - *FOR SHOCK* Stopped (10/14/16 0800)    PRN Medications: sodium chloride, acetaminophen, heparin, [START ON 10/22/2016] Influenza vac split quadrivalent PF, ondansetron (ZOFRAN) IV, sodium chloride, sodium chloride flush, sodium chloride flush, traMADol   Assessment/Plan/Discussion    1. Syncope: VT [employer].  Device interrogation showed multiple episodes of VT. Loading amio per EP. K repleted.  No further VT over the last day. EP following 2. A/C Systolic Biventricular Heart Failure (end-stage) -> Cardiogenic Shock.  NICM. ECHO this admission EF 10% with severe RV dilation and dysfunction. BNP >3000. On admit he had marked volume overload with prominent S3.  He is still markedly volume overloaded by today's Swan numbers with low CI and co-ox today.  Currently has IABP with dobutamine 5 and norepinephrine 50.  Getting CVVH.  Continue to pull 200/h. IABP augmenting normally.  - Continue current IABP settings and norepinephrine.  - Continue dobutamine at 7. Can increase as needed - Continue CVVH, pulling fluid as BP tolerates.  - Not candidate for VAD or transplant so have not pursued ECMO.   3. PAF:  Maintaining NSR on amio.  On heparin with IABP.  4. H/O LV thrombus 5. Acute on CKD III:  Anuric. Now on CVVHD (started 11/17).  - Marked volume overload. Continue to pull as BP tolerates.  6. H/O ETOH  7. H/O tobacco abuse 8. Hyperkalemia: CVVH.  9. MSSA sepsis  - Now on Ancef   He remains critically. Continue full support with IABP, pressors, CVVHD. Prognosis extremely poor. Now DNI. Not candidate for ECMO and VAD.    The patient is critically ill with multiple organ systems failure and requires high complexity decision making for assessment and support, frequent evaluation and titration of therapies, application of advanced monitoring technologies and extensive interpretation of multiple databases.   Critical Care Time devoted to patient care services described in this note is 40 Minutes.  Length of Stay: 5  Bensimhon, Daniel MD 10/15/2016, 8:40 AM  Advanced Heart Failure Team Pager [phone] (M-F; 7a - 4p)  Please contact [facility] for night-coverage after hours (4p -7a ) and weekends on [email or web address]

## 2016-10-15 NOTE — Progress Notes (Signed)
Dr Graciela Husbands at bedside. Informed and observed pt cardiac arrhythmia and ID MD visit. Pt alert and oriented x 4 conversing with Dr. Graciela Husbands. No new orders at this time.

## 2016-10-15 NOTE — Progress Notes (Signed)
ANTICOAGULATION CONSULT NOTE   Pharmacy Consult for heparin Indication: IABP/PAF  No Known Allergies  Patient Measurements: Height: 5\' 7"  (170.2 cm) Weight: 177 lb 7.5 oz (80.5 kg) IBW/kg (Calculated) : 66.1 Heparin Dosing Weight: 80.7 kg  Vital Signs: Temp: 97.2 F (36.2 C) (11/20 0800) Temp Source: Core (Comment) (11/20 0700) BP: 124/71 (11/20 0800) Pulse Rate: 79 (11/20 0800)  Labs:  Recent Labs  10/13/16 0544  10/14/16 0100 10/14/16 0339 10/14/16 1135 10/14/16 1620 10/15/16 0537  HGB 12.9*  --  13.7  --   --   --  14.1  HCT 37.9*  --  39.7  --   --   --  40.7  PLT 129*  --  122*  --   --   --  102*  LABPROT 31.1*  --  27.4*  --   --   --  33.0*  INR 2.92  --  2.50  --   --   --  3.14  HEPARINUNFRC <0.10*  < > 0.27*  --  0.29*  --  0.20*  CREATININE 2.70*  2.66*  < >  --  2.34*  --  2.32* 2.23*  < > = values in this interval not displayed.  Estimated Creatinine Clearance: 40.8 mL/min (by C-G formula based on SCr of 2.23 mg/dL (H)).   Medical History: Past Medical History:  Diagnosis Date  . AICD (automatic cardioverter/defibrillator) present    [employer] 100  . Alcohol abuse    hx  . Cardiomyopathy    secondary  . CHF (congestive heart failure) (HCC)   . Chronic anticoagulation    Coumadin for hx LV thrombus and PAF  . Gout   . HTN (hypertension)    uncontrolled  . Tobacco abuse   . Ventricular tachycardia (HCC)     Assessment: 49 yoM on warfarin PTA now held (last dose 11/15) and started on low-dose heparin drip per Dr. Gala Romney s/p IABP placement on 11/17 for cardiogenic shock/Afib. INR on admit  is 6.86 in the setting of shock liver and pt was given vitamin k 5mg  IV and FFP prior to cath lab. MD requested very low infusion rate at 500 units/hr initally given elevated INR and high likelihood of further procedures. INR today has risen to 3.14 despite no warfarin, heparin level therapeutic at 0.20, Hgb wnl, plt 102, no S/Sx bleeding  noted.   Goal of Therapy:  Heparin level 0.2-0.5 units/ml Monitor platelets by anticoagulation protocol: Yes   Plan:  -Continue heparin infusion at 1250 units/hr for now despite being at low end of goal given acute rise in INR >3 -Monitor heparin level, CBC, S/Sx bleeding daily  Fredonia Highland, PharmD PGY-1 Pharmacy Resident Pager: [phone] 10/15/2016

## 2016-10-15 NOTE — Progress Notes (Signed)
Assessment/ Plan:   Pt is a 59M with a PMH sig for NICM with EF 10% (04/2016), ICD, h/o LV thrombus, HTN, and atrial fibrillation who has acute oliguric kidney injury in the setting of florid cardiogenic shock.   1. Cardiogenic shock 2/2 acute systolic heart failure exac: on pressors, vol overloaded, EF 10%. + Swann, + IABP 1:1, systemic heparin gtt 2. AKI on CKD/ vol overload: baseline cr 1.2-1.4. not responsive to Lasix.  Tolerating UF well.  At 100-200/ hr, continue.   3. Vtach: lido dc, on amio.  No recurrent. 4. Coagulopathy: Vit K and FFP. Secondary to #1 5. Congestive hepatopathy: secondary to #1 as wel 6.  Presumed sepsis: cultures, vanc/ ceftaz (11/19-) 7. Dispo: critically ill, mortality high. Will continue all aggressive measures.  Cont CRRT change to 4k baths   Subjective: Interval History: UF tolerating, no UOP  Objective: Vital signs in last 24 hours: Temp:  [95.9 F (35.5 C)-98.1 F (36.7 C)] 97.5 F (36.4 C) (11/20 1100) Pulse Rate:  [37-164] 78 (11/20 1100) Resp:  [13-28] 25 (11/20 1100) BP: (91-127)/(34-71) 99/55 (11/20 1100) SpO2:  [97 %-100 %] 100 % (11/20 1100) Arterial Line BP: (91-134)/(47-73) 99/55 (11/20 1100) Weight:  [80.5 kg (177 lb 7.5 oz)] 80.5 kg (177 lb 7.5 oz) (11/20 0238) Weight change: -2.7 kg (-5 lb 15.2 oz)  Intake/Output from previous day: 11/19 0701 - 11/20 0700 In: 3333.7 [P.O.:420; I.V.:2413.7; IV Piggyback:500] Out: 7125  Intake/Output this shift: Total I/O In: 726.1 [P.O.:180; I.V.:446.1; IV Piggyback:100] Out: 1401 [Other:1401]  General appearance: alert and cooperative  Cor RRR Ext tr edema NF  Lab Results:  Recent Labs  10/14/16 0100 10/15/16 0537  WBC 8.8 16.4*  HGB 13.7 14.1  HCT 39.7 40.7  PLT 122* 102*   BMET:  Recent Labs  10/14/16 1620 10/15/16 0537  NA 130* 129*  K 4.8 4.2  CL 95* 94*  CO2 24 23  GLUCOSE 108* 145*  BUN 22* 20  CREATININE 2.32* 2.23*  CALCIUM 8.0* 8.0*   No results for  input(s): PTH in the last 72 hours. Iron Studies: No results for input(s): IRON, TIBC, TRANSFERRIN, FERRITIN in the last 72 hours. Studies/Results: Dg Chest Port 1 View  Result Date: 10/14/2016 CLINICAL DATA:  Fever, chills. EXAM: PORTABLE CHEST 1 VIEW COMPARISON:  11-04-16 FINDINGS: Left pacer remains in place, unchanged. Right Swan-Ganz catheter is in place with the tip in the right lower lobe pulmonary artery. There is marked cardiomegaly with vascular congestion. No overt failure. Probable left lower lobe compressive atelectasis. No definite effusion. IMPRESSION: Swan-Ganz catheter in the central right lower lobe pulmonary artery. Marked cardiomegaly and vascular congestion. Electronically Signed   By: Charlett Nose M.D.   On: 10/14/2016 07:24   Scheduled: .  ceFAZolin (ANCEF) IV  2 g Intravenous Q12H  . insulin aspart  0-5 Units Subcutaneous QHS  . insulin aspart  0-9 Units Subcutaneous TID WC  . sodium chloride flush  10-40 mL Intracatheter Q12H  . sodium chloride flush  3 mL Intravenous Q12H     LOS: 5 days   Damarian Priola C 10/15/2016,11:40 AM

## 2016-10-15 NOTE — Progress Notes (Signed)
SUBJECTIVE: The patient denies shortness of breath, CP.  REports no complaints at all actually.  Marland Kitchen.  ceFAZolin (ANCEF) IV  2 g Intravenous Q12H  . insulin aspart  0-5 Units Subcutaneous QHS  . insulin aspart  0-9 Units Subcutaneous TID WC  . sodium chloride flush  10-40 mL Intracatheter Q12H  . sodium chloride flush  3 mL Intravenous Q12H   . sodium chloride 10 mL/hr at 10/15/16 0800  . amiodarone 60 mg/hr (10/15/16 0800)  . DOBUTamine 6.464 mcg/kg/min (10/15/16 0800)  . heparin 1,250 Units/hr (10/15/16 0800)  . norepinephrine (LEVOPHED) Adult infusion 55.04 mcg/min (10/15/16 0800)  . dialysis replacement fluid (prismasate) 300 mL/hr at 10/15/16 0158  . dialysis replacement fluid (prismasate) 200 mL/hr at 10/14/16 0857  . dialysate (PRISMASATE) 2,200 mL/hr at 10/15/16 0611  . vasopressin (PITRESSIN) infusion - *FOR SHOCK* Stopped (10/14/16 0800)    OBJECTIVE: Physical Exam: Vitals:   10/15/16 0500 10/15/16 0600 10/15/16 0700 10/15/16 0800  BP: (!) 116/53 (!) 115/57 (!) 102/53 124/71  Pulse: 78 80 78 79  Resp: 13 (!) 23 15 16   Temp: (!) 96.4 F (35.8 C) 97 F (36.1 C) 97 F (36.1 C) 97.2 F (36.2 C)  TempSrc: Core (Comment) Core (Comment) Core (Comment)   SpO2: 100% 99% 100% 100%  Weight:      Height:        Intake/Output Summary (Last 24 hours) at 10/15/16 0808 Last data filed at 10/15/16 0800  Gross per 24 hour  Intake          3320.26 ml  Output             7270 ml  Net         -3949.74 ml    Telemetry reviewed by myself, last 24 shows SR, slow WC rhythm, slow VT 80's  GEN- The patient is ill appearing, alert and oriented x 3 today.   Head- normocephalic, atraumatic Eyes-  Sclera clear, conjunctiva pink Ears- hearing intact Oropharynx- clear Neck- supple, no JVP Lungs- Clear to ausculation bilaterally, normal work of breathing Heart- Regular rate and rhythm, no significant murmurs, no rubs or gallops GI- soft, NT, ND Extremities- no clubbing, cyanosis,  or edema Skin- no rash or lesion Psych- euthymic mood, full affect Neuro- no gross deficits appreciated  Trialysis catheter and IABP in place  LABS: Basic Metabolic Panel:  Recent Labs  29/56/2111/19/17 0339 10/14/16 1620 10/15/16 0537  NA 130* 130* 129*  K 5.5* 4.8 4.2  CL 94* 95* 94*  CO2 25 24 23   GLUCOSE 104* 108* 145*  BUN 25* 22* 20  CREATININE 2.34* 2.32* 2.23*  CALCIUM 8.4* 8.0* 8.0*  MG 2.7*  --  2.5*  PHOS 3.8 3.5 3.1   Liver Function Tests:  Recent Labs  10/14/16 1620 10/15/16 0537  ALBUMIN 3.0* 3.0*   No results for input(s): LIPASE, AMYLASE in the last 72 hours. CBC:  Recent Labs  10/14/16 0100 10/15/16 0537  WBC 8.8 16.4*  HGB 13.7 14.1  HCT 39.7 40.7  MCV 85.2 83.2  PLT 122* PENDING    RADIOLOGY: Dg Chest Port 1 View Result Date: 10/14/2016 CLINICAL DATA:  Fever, chills. EXAM: PORTABLE CHEST 1 VIEW COMPARISON:  10/23/2016 FINDINGS: Left pacer remains in place, unchanged. Right Swan-Ganz catheter is in place with the tip in the right lower lobe pulmonary artery. There is marked cardiomegaly with vascular congestion. No overt failure. Probable left lower lobe compressive atelectasis. No definite effusion. IMPRESSION: Swan-Ganz catheter in the  central right lower lobe pulmonary artery. Marked cardiomegaly and vascular congestion. Electronically Signed   By: Charlett Nose M.D.   On: 10/14/2016 07:24     ASSESSMENT AND PLAN:   1. Septic/cardiogenic shock w/MSOF      IABP remains 1;1, augmented SBP 90's-low 100     On Dobutamine gtt and levophed gtt increasing doses     Hypothermic in the last 24hrs  Hyponatremic 129 Leukocytosis 16.4 Lactic acid 2.2 INR 3.14 H/H 14/40 plts (pending)  CO 2.7  Cumulative fluid negative: -  2. + BC with gram + cocci      Staph      abx changed to ancef      Will call ID to case  ICD in place, BSCi implanted 2010       3. VT     On amiodarone, rate slow  4. ARF     On CRRT     Continue current  management to CHF and ICU team, nephrology Await Dr. Harriette Ohara, PA-C 10/15/2016 8:08 AM  Problems are as outlined above. The patient remains in cardiogenic shock requiring high-dose pressors and balloon pump support. Unfortunately, intervening is what appears to be staph aureus bacteremia/sepsis. I spoke with the patient and his wife yesterday and again today. I am increasingly pessimistic that he will be able to survive this further insult. I have told him that today. In the wake of our discussions yesterday the family decided not to undergo intubation and/or shocks. I concur with that decision.  With his map now over 23, we will begin to down titrate pressors but will plan not to exceed 50 mics of Levophed in the event that it becomes required   I offered them palliative care. He feels comfortable at this point with the 2 of [place] having conversations regarding goals of therapy.  Given the complexity of his infection and given the number of indwelling lines I have asked infectious diseases to consult. I have told the patient that he cannot live without the current support offered by dialysis balloon pump and central line access for inotropes and pressors. The alternative would be to switch all of these lines out. We would be left with his indwelling device which 2 is at high risk for exposure and seating. I am not sure that it increases the likelihood of survival. I will await input from the heart failure team and infectious diseases.  We have spent 55 minutes this morning on these issues.

## 2016-10-16 LAB — COOXEMETRY PANEL
CARBOXYHEMOGLOBIN: 0.6 % (ref 0.5–1.5)
Carboxyhemoglobin: 1 % (ref 0.5–1.5)
Carboxyhemoglobin: 0.6 % (ref 0.5–1.5)
METHEMOGLOBIN: 0.8 % (ref 0.0–1.5)
METHEMOGLOBIN: 0.9 % (ref 0.0–1.5)
Methemoglobin: 0.9 % (ref 0.0–1.5)
O2 SAT: 33.1 %
O2 Saturation: 32.8 %
O2 Saturation: 30.5 %
TOTAL HEMOGLOBIN: 14.3 g/dL (ref 12.0–16.0)
TOTAL HEMOGLOBIN: 13 g/dL (ref 12.0–16.0)
Total hemoglobin: 14.1 g/dL (ref 12.0–16.0)

## 2016-10-16 LAB — CBC
HCT: 38.7 % — ABNORMAL LOW (ref 39.0–52.0)
Hemoglobin: 13.8 g/dL (ref 13.0–17.0)
MCH: 29.3 pg (ref 26.0–34.0)
MCHC: 35.7 g/dL (ref 30.0–36.0)
MCV: 82.2 fL (ref 78.0–100.0)
PLATELETS: 95 10*3/uL — AB (ref 150–400)
RBC: 4.71 MIL/uL (ref 4.22–5.81)
RDW: 20.3 % — AB (ref 11.5–15.5)
WBC: 11.2 10*3/uL — ABNORMAL HIGH (ref 4.0–10.5)

## 2016-10-16 LAB — RENAL FUNCTION PANEL
ANION GAP: 11 (ref 5–15)
ANION GAP: 10 (ref 5–15)
Albumin: 3 g/dL — ABNORMAL LOW (ref 3.5–5.0)
Albumin: 2.8 g/dL — ABNORMAL LOW (ref 3.5–5.0)
BUN: 18 mg/dL (ref 6–20)
BUN: 17 mg/dL (ref 6–20)
CALCIUM: 8.3 mg/dL — AB (ref 8.9–10.3)
CHLORIDE: 96 mmol/L — AB (ref 101–111)
CO2: 23 mmol/L (ref 22–32)
CO2: 23 mmol/L (ref 22–32)
CREATININE: 1.94 mg/dL — AB (ref 0.61–1.24)
Calcium: 8.2 mg/dL — ABNORMAL LOW (ref 8.9–10.3)
Chloride: 95 mmol/L — ABNORMAL LOW (ref 101–111)
Creatinine, Ser: 1.9 mg/dL — ABNORMAL HIGH (ref 0.61–1.24)
GFR calc Af Amer: 45 mL/min — ABNORMAL LOW (ref >60)
GFR calc Af Amer: 46 mL/min — ABNORMAL LOW (ref >60)
GFR calc non Af Amer: 39 mL/min — ABNORMAL LOW (ref >60)
GFR, EST NON AFRICAN AMERICAN: 40 mL/min — AB (ref >60)
GLUCOSE: 139 mg/dL — AB (ref 65–99)
Glucose, Bld: 100 mg/dL — ABNORMAL HIGH (ref 65–99)
POTASSIUM: 4.2 mmol/L (ref 3.5–5.1)
Phosphorus: 2.4 mg/dL — ABNORMAL LOW (ref 2.5–4.6)
Phosphorus: 2.4 mg/dL — ABNORMAL LOW (ref 2.5–4.6)
Potassium: 4.2 mmol/L (ref 3.5–5.1)
SODIUM: 129 mmol/L — AB (ref 135–145)
Sodium: 129 mmol/L — ABNORMAL LOW (ref 135–145)

## 2016-10-16 LAB — GLUCOSE, CAPILLARY
GLUCOSE-CAPILLARY: 135 mg/dL — AB (ref 65–99)
GLUCOSE-CAPILLARY: 116 mg/dL — AB (ref 65–99)
Glucose-Capillary: 144 mg/dL — ABNORMAL HIGH (ref 65–99)
Glucose-Capillary: 173 mg/dL — ABNORMAL HIGH (ref 65–99)

## 2016-10-16 LAB — PROTIME-INR
INR: 2.13
Prothrombin Time: 24.2 seconds — ABNORMAL HIGH (ref 11.4–15.2)

## 2016-10-16 LAB — CULTURE, BLOOD (ROUTINE X 2)

## 2016-10-16 LAB — HEPARIN LEVEL (UNFRACTIONATED)
HEPARIN UNFRACTIONATED: 0.18 [IU]/mL — AB (ref 0.30–0.70)
HEPARIN UNFRACTIONATED: 0.26 [IU]/mL — AB (ref 0.30–0.70)
Heparin Unfractionated: 0.14 IU/mL — ABNORMAL LOW (ref 0.30–0.70)

## 2016-10-16 LAB — MAGNESIUM: Magnesium: 2.5 mg/dL — ABNORMAL HIGH (ref 1.7–2.4)

## 2016-10-16 LAB — LACTIC ACID, PLASMA: LACTIC ACID, VENOUS: 2.1 mmol/L — AB (ref 0.5–1.9)

## 2016-10-16 MED ORDER — POTASSIUM CHLORIDE 2 MEQ/ML IV SOLN
INTRAVENOUS | Status: DC
  Administered 2016-10-16 – 2016-10-18 (×3): via INTRAVENOUS
  Filled 2016-10-16 (×5): qty 3000

## 2016-10-16 MED ORDER — DOBUTAMINE IN D5W 4-5 MG/ML-% IV SOLN
10.0000 ug/kg/min | INTRAVENOUS | Status: DC
  Administered 2016-10-16 – 2016-10-18 (×2): 7 ug/kg/min via INTRAVENOUS
  Administered 2016-10-19: 8 ug/kg/min via INTRAVENOUS
  Administered 2016-10-20 – 2016-10-30 (×12): 10 ug/kg/min via INTRAVENOUS
  Filled 2016-10-16 (×14): qty 250

## 2016-10-16 NOTE — Progress Notes (Signed)
ANTICOAGULATION CONSULT NOTE   Pharmacy Consult for heparin Indication: IABP/PAF  No Known Allergies  Patient Measurements: Height: 5\' 7"  (170.2 cm) Weight: 168 lb 6.9 oz (76.4 kg) IBW/kg (Calculated) : 66.1 Heparin Dosing Weight: 80.7 kg  Vital Signs: Temp: 97.7 F (36.5 C) (11/21 1200) Temp Source: Core (Comment) (11/21 1200) BP: 86/63 (11/21 0700) Pulse Rate: 77 (11/21 1200)  Labs:  Recent Labs  10/14/16 0100  10/15/16 0537 10/15/16 1622 10/16/16 0400 10/16/16 1115  HGB 13.7  --  14.1  --  13.8  --   HCT 39.7  --  40.7  --  38.7*  --   PLT 122*  --  102*  --  95*  --   LABPROT 27.4*  --  33.0*  --  24.2*  --   INR 2.50  --  3.14  --  2.13  --   HEPARINUNFRC 0.27*  < > 0.20*  --  0.14* 0.18*  CREATININE  --   < > 2.23* 1.38* 1.94*  --   < > = values in this interval not displayed.  Estimated Creatinine Clearance: 42.6 mL/min (by C-G formula based on SCr of 1.94 mg/dL (H)).   Medical History: Past Medical History:  Diagnosis Date  . AICD (automatic cardioverter/defibrillator) present    [employer] 100  . Alcohol abuse    hx  . Cardiomyopathy    secondary  . CHF (congestive heart failure) (HCC)   . Chronic anticoagulation    Coumadin for hx LV thrombus and PAF  . Gout   . HTN (hypertension)    uncontrolled  . Tobacco abuse   . Ventricular tachycardia (HCC)     Assessment: 49 yoM on warfarin PTA now held (last dose 11/15) and started on low-dose heparin drip per Dr. Gala Romney s/p IABP placement on 11/17 for cardiogenic shock/Afib. INR on admit is 6.86 in the setting of shock liver and pt was given vitamin k 5mg  IV and FFP prior to cath lab. MD requested very low infusion rate at 500 units/hr initally given elevated INR and high likelihood of further procedures, now titrating heparin to goal 0.2-0.5. Heparin level subtherapeutic at 0.18 after dose increase this morning. Hgb stable, plt down to 95, no S/Sx bleeding noted.  Goal of Therapy:   Heparin level 0.2-0.5 units/ml Monitor platelets by anticoagulation protocol: Yes   Plan:  -Increase heparin to 1550 units/hr -Follow-up 6-hr heparin level -Monitor heparin level, CBC, S/Sx bleeding daily  Fredonia Highland, PharmD PGY-1 Pharmacy Resident Pager: [phone] 10/16/2016

## 2016-10-16 NOTE — Progress Notes (Addendum)
[facility]    Date of Admission:  10/17/16   Total days of antibiotics 3        Day 3 cefazolin           ID: Parker Johnson is a 50 y.o. male with NICM admitted for severe cardiogenic shock requiring vasopressors, inotropes, IABP, developing AKI, now on dialysis who has had multiple lines placed out of necessity including PICC, trialysis line, swann-ganz, arterial line and IABP to treat cardiogenic shock now complicated with MSSA bacteremia. His course suggests that he either may have just started to develop infection as he was admitted vs. Nosocomial acquisition from multiple line procedures Active Problems:   Ventricular fibrillation (HCC)   Cardiogenic shock (HCC)   AKI (acute kidney injury) (HCC)    Subjective: He had fever up to 100.8 yesterday evening. Repeat blood cx taken this morning. He reports feeling cold this afternoon, he is covered under numerous blankets.     Medications:  .  ceFAZolin (ANCEF) IV  2 g Intravenous Q12H  . insulin aspart  0-5 Units Subcutaneous QHS  . insulin aspart  0-9 Units Subcutaneous TID WC  . sodium chloride flush  10-40 mL Intracatheter Q12H  . sodium chloride flush  3 mL Intravenous Q12H    Objective: Vital signs in last 24 hours: Temp:  [97.7 F (36.5 C)-100.8 F (38.2 C)] 97.7 F (36.5 C) (11/21 1200) Pulse Rate:  [73-148] 77 (11/21 1200) Resp:  [11-30] 26 (11/21 1200) BP: (86-125)/(44-94) 86/63 (11/21 0700) SpO2:  [90 %-100 %] 100 % (11/21 1200) Arterial Line BP: (87-178)/(44-167) 99/58 (11/21 1200) Weight:  [168 lb 6.9 oz (76.4 kg)] 168 lb 6.9 oz (76.4 kg) (11/21 0500) Physical Exam  Constitutional: He is oriented to person, place, and time. He appears well-developed and well-nourished. No distress.  HENT:  Mouth/Throat: Oropharynx is clear and moist. No oropharyngeal exudate.  Cardiovascular: Normal rate, regular rhythm and normal heart sounds. Humming of balloon pump noted No murmur heard.    Pulmonary/Chest: Effort normal and breath sounds normal. No respiratory distress. He has no wheezes.  Ext: no edema, Skin: Skin is warm and dry. No rash noted. No erythema.   Lab Results  Recent Labs  10/15/16 0537 10/15/16 1622 10/16/16 0400  WBC 16.4*  --  11.2*  HGB 14.1  --  13.8  HCT 40.7  --  38.7*  NA 129* 133* 129*  K 4.2 4.2 4.2  CL 94* 97* 95*  CO2 23 25 23   BUN 20 14 18   CREATININE 2.23* 1.38* 1.94*   Liver Panel  Recent Labs  10/15/16 1622 10/16/16 0400  ALBUMIN 3.3* 3.0*    Microbiology: 11/21 blood cx pending 11/19 blood cx x 2 MSSA Studies/Results: No results found.   Assessment/Plan: 50 y.o. male with NICM admitted for severe cardiogenic shock requiring vasopressors, inotropes, IABP, developing AKI, now on dialysis who has had multiple lines placed out of necessity including PICC, trialysis line, swann-ganz, arterial line and IABP to treat cardiogenic shock now complicated with MSSA bacteremia. His course suggests that he either may have just started to develop infection as he was admitted vs. Nosocomial acquisition from multiple line procedures  MSSA bacteremia = continue with cefazolin, await repeat blood cx, concern that he is still bacteremic since he had fevers last night. For now, appears that no lines are able to be discontinued.  Fevers = continue to cool as needed, provide supportive care  Leukocytosis = slightly improved.  aki = remains on CRRt  Cardiogenic shock = managed by HF team -remains on dobutamine 5 and norepi 50. IV amio, and balloon-pump    Collin Hendley, [facility] Cell: [phone] Pager: [phone]  10/16/2016, 4:56 PM

## 2016-10-16 NOTE — Progress Notes (Signed)
ANTICOAGULATION CONSULT NOTE - Follow Up Consult  Pharmacy Consult for heparin Indication: IABP  Labs:  Recent Labs  10/14/16 0100  10/14/16 1135 10/14/16 1620 10/15/16 0537 10/15/16 1622 10/16/16 0400  HGB 13.7  --   --   --  14.1  --  13.8  HCT 39.7  --   --   --  40.7  --  38.7*  PLT 122*  --   --   --  102*  --  95*  LABPROT 27.4*  --   --   --  33.0*  --  24.2*  INR 2.50  --   --   --  3.14  --  2.13  HEPARINUNFRC 0.27*  --  0.29*  --  0.20*  --  0.14*  CREATININE  --   < >  --  2.32* 2.23* 1.38*  --   < > = values in this interval not displayed.   Assessment: 50yo male now below goal on heparin after three levels at goal though had been trending down.  Goal of Therapy:  Heparin level 0.2-0.5 units/ml   Plan:  Will increase heparin gtt by 2 units/kg/hr to 1400 units/hr and check level in 6hr.  Vernard Gambles, PharmD, BCPS  10/16/2016,5:16 AM

## 2016-10-16 NOTE — Progress Notes (Signed)
Pt's wife called this morning and requested that only the pt's mother and daughter are allowed to visit. Sign has been posted on pt door for visitors to "see nurse before entering". Will inform on coming  AM nurse of family request.

## 2016-10-16 NOTE — Progress Notes (Signed)
Patient ID: Parker Johnson, male   DOB: 10-Oct-1966, 50 y.o.   MRN: [id number]    Advanced Heart Failure Rounding Note   Subjective:    Admitted with VT/syncope and profound cardiogenic shock. PICC placed with initial CO-OX 26%.   Underwent IABP and Trialysis cath placement on 11/17  Now on dobutamine 5 and norepi 50. IV amio.   On 11/19 developed sepsis. Bcx + for MSSA. Now on Ancef.     Havings runs of NSVT overnight. 12 beats +.  States he feels "better" this am.  No dyspnea.  No palpitations or chest pain/pressure.  CVVHD pulling 100-200/hr.   Co-ox 32.8% this am. On Dobutamine 2 and norepi 46.   K and Mg stable.   INR 2.13  Swan this morning 0700 RA  25 PA 29/22 Thermo CI 1.5   Objective:   Weight Range:  Vital Signs:   Temp:  [97.2 F (36.2 C)-100.8 F (38.2 C)] 98.4 F (36.9 C) (11/21 0700) Pulse Rate:  [75-164] 77 (11/21 0700) Resp:  [10-33] 19 (11/21 0700) BP: (86-125)/(34-94) 86/63 (11/21 0700) SpO2:  [96 %-100 %] 99 % (11/21 0700) Arterial Line BP: (86-178)/(44-167) 88/66 (11/21 0700) Weight:  [168 lb 6.9 oz (76.4 kg)] 168 lb 6.9 oz (76.4 kg) (11/21 0500) Last BM Date: 10/08/2016  Weight change: Filed Weights   10/14/16 0500 10/15/16 0238 10/16/16 0500  Weight: 183 lb 6.8 oz (83.2 kg) 177 lb 7.5 oz (80.5 kg) 168 lb 6.9 oz (76.4 kg)    Intake/Output:   Intake/Output Summary (Last 24 hours) at 10/16/16 0805 Last data filed at 10/16/16 0700  Gross per 24 hour  Intake          3125.03 ml  Output             6361 ml  Net         -3235.97 ml     Physical Exam:  General:  Lying in bed. Critically ill and fatigued appearing.  HEENT: Normal Neck: supple. JVP to ear. RIJ swan . Carotids 2+ bilat; no bruits. No thyromegaly or nodule noted.  Cor: PMI nondisplaced. RRR. No murmurs or rubs. +S3 Lungs: Crackles anteriorly Abdomen: soft, NT, ND, no HSM. No bruits or masses. +BS  Extremities: no cyanosis, clubbing, rash  RUE PICC. R and LLE 1+ edema. Ted  hose present. R femoral trialysis cath and IABP Neuro: awake. Follows commands cranial nerves grossly intact. moves all 4 extremities w/o difficulty.   Telemetry: Reviewed, NSR with occasional NSVT ~12 beats +  Labs: Basic Metabolic Panel:  Recent Labs Lab 10/21/2016 0442  10/13/16 0544  10/14/16 0339 10/14/16 1620 10/15/16 0537 10/15/16 1622 10/16/16 0400  NA 129*  < > 128*  129*  < > 130* 130* 129* 133* 129*  K 4.9  < > 5.1  5.0  < > 5.5* 4.8 4.2 4.2 4.2  CL 87*  < > 89*  90*  < > 94* 95* 94* 97* 95*  CO2 25  < > 26  26  < > 25 24 23 25 23   GLUCOSE 147*  < > 150*  146*  < > 104* 108* 145* 133* 139*  BUN 34*  < > 34*  33*  < > 25* 22* 20 14 18   CREATININE 2.90*  < > 2.70*  2.66*  < > 2.34* 2.32* 2.23* 1.38* 1.94*  CALCIUM 8.8*  < > 8.2*  8.1*  < > 8.4* 8.0* 8.0* 8.4* 8.3*  MG 2.7*  --  2.7*  --  2.7*  --  2.5*  --  2.5*  PHOS  --   < > 4.0  < > 3.8 3.5 3.1 1.8* 2.4*  < > = values in this interval not displayed.  Liver Function Tests:  Recent Labs Lab 10/05/2016 0957 10/11/16 0422 Nov 09, 2016 0500  10/14/16 0339 10/14/16 1620 10/15/16 0537 10/15/16 1622 10/16/16 0400  AST 31 62* 287*  --   --   --   --   --   --   ALT 16* 39 184*  --   --   --   --   --   --   ALKPHOS 88 86 82  --   --   --   --   --   --   BILITOT 2.8* 2.8* 2.5*  --   --   --   --   --   --   PROT 7.5 6.8 7.4  --   --   --   --   --   --   ALBUMIN 3.3* 3.1* 3.3*  < > 3.4* 3.0* 3.0* 3.3* 3.0*  < > = values in this interval not displayed. No results for input(s): LIPASE, AMYLASE in the last 168 hours. No results for input(s): AMMONIA in the last 168 hours.  CBC:  Recent Labs Lab 10/21/2016 0957 11/09/16 2136 10/13/16 0544 10/14/16 0100 10/15/16 0537 10/16/16 0400  WBC 6.6 5.7 6.0 8.8 16.4* 11.2*  NEUTROABS 3.1  --   --   --   --   --   HGB 14.7 12.8* 12.9* 13.7 14.1 13.8  HCT 43.9 36.7* 37.9* 39.7 40.7 38.7*  MCV 87.8 84.4 84.4 85.2 83.2 82.2  PLT 169 138* 129* 122* 102* 95*     Cardiac Enzymes: No results for input(s): CKTOTAL, CKMB, CKMBINDEX, TROPONINI in the last 168 hours.  BNP: BNP (last 3 results)  Recent Labs  05/16/16 0420 06/21/16 1218 10/12/2016 0957  BNP 2,453.6* 2,504.3* 3,542.3*    ProBNP (last 3 results) No results for input(s): PROBNP in the last 8760 hours.    Other results:  Imaging: No results found.   Medications:     Scheduled Medications: .  ceFAZolin (ANCEF) IV  2 g Intravenous Q12H  . insulin aspart  0-5 Units Subcutaneous QHS  . insulin aspart  0-9 Units Subcutaneous TID WC  . sodium chloride flush  10-40 mL Intracatheter Q12H  . sodium chloride flush  3 mL Intravenous Q12H    Infusions: . sodium chloride 10 mL/hr at 10/15/16 1900  . amiodarone 60 mg/hr (10/16/16 0552)  . DOBUTamine    . heparin 1,400 Units/hr (10/16/16 0516)  . norepinephrine (LEVOPHED) Adult infusion 46 mcg/min (10/16/16 0207)  . dialysis replacement fluid (prismasate) 300 mL/hr at 10/16/16 0552  . dialysis replacement fluid (prismasate) 200 mL/hr at 10/15/16 1220  . dialysate (PRISMASATE) 2,200 mL/hr at 10/16/16 0552  . vasopressin (PITRESSIN) infusion - *FOR SHOCK* Stopped (10/14/16 0800)    PRN Medications: sodium chloride, acetaminophen, heparin, [START ON 10/22/2016] Influenza vac split quadrivalent PF, ondansetron (ZOFRAN) IV, sodium chloride, sodium chloride flush, sodium chloride flush, traMADol   Assessment/Plan/Discussion    1. Syncope: VT [employer].  Device interrogation showed multiple episodes of VT. Loading amio per EP. K repleted. No further VT over the last day. EP following 2. A/C Systolic Biventricular Heart Failure (end-stage) -> Cardiogenic Shock.  NICM. ECHO this admission EF 10% with severe RV dilation and dysfunction. BNP >3000. On admit he had  marked volume overload with prominent S3.  - He remains volume overloaded. - Coox markedly depressed, though dobutamine was turned down to 2 overnight from 7. -  IABP augmenting normally.  - Continue current IABP settings and norepinephrine.  - Increase dobutamine to 5.  It is unclear why it was turned down to 2. Can increase back up to 7 as needed - Continue CVVH, pulling fluid as BP tolerates.  - Not candidate for VAD or transplant so have not pursued ECMO.   3. PAF:  Maintaining NSR on amio.  On heparin with IABP.  4. H/O LV thrombus 5. Acute on CKD III:  Anuric. Now on CVVHD (started 11/17).  - Marked volume overload. Continue to pull as BP tolerates.  6. H/O ETOH  7. H/O tobacco abuse 8. Hyperkalemia: CVVH.  9. MSSA sepsis  - Now on Ancef   Turned dobutamine back and and repeat stat coox.  Will recheck swan numbers with MD once acclimated back on higher dose of dobutamine.   Length of Stay: [address]  Graciella Freer, [place] 10/16/2016, 8:05 AM  Advanced Heart Failure Team Pager [phone] (M-F; 7a - 4p)  Please contact [facility] for night-coverage after hours (4p -7a ) and weekends on [email or web address]  Patient seen and examined with Otilio Saber, PA-C. We discussed all aspects of the encounter. I agree with the assessment and plan as stated above.   He remains critically ill with falling co-ox after dobutamine decreased. Will increase dobutamine back up. Volume status improving. Will continue to pull with CVVHD as tolerated. Starting to make some urine. This am was back in wide complex rhythm. Reviewed with EP. On device interrogation it looks like he is V-pacing a lot. We have adjusted device.   Will continue to pull with CVVHD. Once filling pressures improved will then try to wean IABP followed by inotropes. I had frank talk with him today that his heart may no longer be strong enough to support itself and we may have to consider Hospice.   The patient is critically ill with multiple organ systems failure and requires high complexity decision making for assessment and support, frequent evaluation and titration of therapies, application of  advanced monitoring technologies and extensive interpretation of multiple databases.   Critical Care Time devoted to patient care services described in this note is 35 Minutes.  Tiena Manansala,MD 12:01 PM

## 2016-10-16 NOTE — Progress Notes (Signed)
Assessment/ Plan:   Pt is a 16M with a PMH sig for NICM with EF 10% (04/2016), ICD, h/o LV thrombus, HTN, and atrial fibrillation who has acute oliguric kidney injury in the setting of florid cardiogenic shock.   1. Cardiogenic shock 2/2 acute systolic heart failure exac: on pressors, vol overloaded, EF 10%. + Swann, + IABP 1:1, systemic heparin gtt 2. AKI on CKD/ vol overload: baseline cr 1.2-1.4. not responsive to Lasix. Tolerating UF well. At 100-200/ hr, continue CRRT. Hyponatremia appearing so will change fluids. 3. Vtach: lido dc, on amio. No recurrent. 6. Presumed sepsis: cultures, vanc/ ceftaz (11/19-) 7. Dispo:  Cont CRRT w/ 4k baths   Subjective: Interval History: Aggressive support ongoing.  Voided 175cc.  Objective: Vital signs in last 24 hours: Temp:  [97.7 F (36.5 C)-100.8 F (38.2 C)] 97.7 F (36.5 C) (11/21 1200) Pulse Rate:  [73-148] 77 (11/21 1200) Resp:  [11-30] 26 (11/21 1200) BP: (86-125)/(44-94) 86/63 (11/21 0700) SpO2:  [90 %-100 %] 100 % (11/21 1200) Arterial Line BP: (87-178)/(44-167) 99/58 (11/21 1200) Weight:  [76.4 kg (168 lb 6.9 oz)] 76.4 kg (168 lb 6.9 oz) (11/21 0500) Weight change: -4.1 kg (-9 lb 0.6 oz)  Intake/Output from previous day: 11/20 0701 - 11/21 0700 In: 3312.9 [P.O.:650; I.V.:2462.9; IV Piggyback:200] Out: 6789 [Urine:175] Intake/Output this shift: Total I/O In: 996.9 [P.O.:370; I.V.:526.9; IV Piggyback:100] Out: 1728 [Other:1728]  General appearance: alert and cooperative Resp: clear to auscultation bilaterally Chest wall: no tenderness, AICD on left Cardio: regular rate and rhythm, S1, S2 normal, no murmur, click, rub or gallop Extremities: extremities normal, atraumatic, no cyanosis or edema  Lab Results:  Recent Labs  10/15/16 0537 10/16/16 0400  WBC 16.4* 11.2*  HGB 14.1 13.8  HCT 40.7 38.7*  PLT 102* 95*   BMET:  Recent Labs  10/15/16 1622 10/16/16 0400  NA 133* 129*  K 4.2 4.2  CL 97* 95*  CO2  25 23  GLUCOSE 133* 139*  BUN 14 18  CREATININE 1.38* 1.94*  CALCIUM 8.4* 8.3*   No results for input(s): PTH in the last 72 hours. Iron Studies: No results for input(s): IRON, TIBC, TRANSFERRIN, FERRITIN in the last 72 hours. Studies/Results: No results found.  Continuous: . sodium chloride 10 mL/hr at 10/16/16 0700  . amiodarone 60 mg/hr (10/16/16 0700)  . DOBUTamine 7 mcg/kg/min (10/16/16 1245)  . heparin 1,400 Units/hr (10/16/16 1011)  . norepinephrine (LEVOPHED) Adult infusion 45 mcg/min (10/16/16 0800)  . dialysis replacement fluid (prismasate) 300 mL/hr at 10/16/16 0552  . dialysis replacement fluid (prismasate) 200 mL/hr at 10/16/16 0958  . dialysate (PRISMASATE) 2,200 mL/hr at 10/16/16 1240     LOS: 6 days   Nizhoni Parlow C 10/16/2016,1:15 PM

## 2016-10-16 NOTE — Progress Notes (Signed)
Arterial line, dressing, and setup changed as well as CVP line and setup.

## 2016-10-16 NOTE — Progress Notes (Signed)
ANTICOAGULATION CONSULT NOTE   Pharmacy Consult for heparin Indication: IABP/PAF  No Known Allergies  Patient Measurements: Height: 5\' 7"  (170.2 cm) Weight: 168 lb 6.9 oz (76.4 kg) IBW/kg (Calculated) : 66.1 Heparin Dosing Weight: 80.7 kg  Vital Signs: Temp: 98.8 F (37.1 C) (11/21 2100) Temp Source: Core (Comment) (11/21 2000) Pulse Rate: 75 (11/21 2100)  Labs:  Recent Labs  10/14/16 0100  10/15/16 0537 10/15/16 1622 10/16/16 0400 10/16/16 1115 10/16/16 1600 10/16/16 2015  HGB 13.7  --  14.1  --  13.8  --   --   --   HCT 39.7  --  40.7  --  38.7*  --   --   --   PLT 122*  --  102*  --  95*  --   --   --   LABPROT 27.4*  --  33.0*  --  24.2*  --   --   --   INR 2.50  --  3.14  --  2.13  --   --   --   HEPARINUNFRC 0.27*  < > 0.20*  --  0.14* 0.18*  --  0.26*  CREATININE  --   < > 2.23* 1.38* 1.94*  --  1.90*  --   < > = values in this interval not displayed.  Estimated Creatinine Clearance: 43.5 mL/min (by C-G formula based on SCr of 1.9 mg/dL (H)).   Medical History: Past Medical History:  Diagnosis Date  . AICD (automatic cardioverter/defibrillator) present    [employer] 100  . Alcohol abuse    hx  . Cardiomyopathy    secondary  . CHF (congestive heart failure) (HCC)   . Chronic anticoagulation    Coumadin for hx LV thrombus and PAF  . Gout   . HTN (hypertension)    uncontrolled  . Tobacco abuse   . Ventricular tachycardia [facility])     Assessment: 50 yo M on warfarin PTA now held (last dose 11/15) and started on low-dose heparin drip per Dr. Gala Romney s/p IABP placement on 11/17 for cardiogenic shock/Afib. INR on admit was 6.86 in the setting of shock liver and pt was given vitamin k 5mg  IV and FFP prior to cath lab. MD requested very low infusion rate at 500 units/hr initally given elevated INR and high likelihood of  further procedures, now titrating heparin to goal 0.2-0.5.  Heparin level therapeutic on 1550 units/hr.  Goal of  Therapy:  Heparin level 0.2-0.5 units/ml Monitor platelets by anticoagulation protocol: Yes   Plan:  -Continue heparin at 1550 units/hr -Follow-up AM heparin level for confirmation -Monitor heparin level, CBC, S/Sx bleeding daily  [employer], Pharm.D., BCPS Clinical Pharmacist Pager [phone] 10/16/2016 9:23 PM

## 2016-10-16 NOTE — Progress Notes (Signed)
SUBJECTIVE: The patient says he is feeling a little better today, no specific complaints, denies any SOB or any CP  .  ceFAZolin (ANCEF) IV  2 g Intravenous Q12H  . insulin aspart  0-5 Units Subcutaneous QHS  . insulin aspart  0-9 Units Subcutaneous TID WC  . sodium chloride flush  10-40 mL Intracatheter Q12H  . sodium chloride flush  3 mL Intravenous Q12H   . sodium chloride 10 mL/hr at 10/16/16 0700  . amiodarone 60 mg/hr (10/16/16 0700)  . DOBUTamine 7 mcg/kg/min (10/16/16 1245)  . heparin 1,550 Units/hr (10/16/16 1335)  . norepinephrine (LEVOPHED) Adult infusion 45 mcg/min (10/16/16 0800)  . dialysis replacement fluid (prismasate) 300 mL/hr at 10/16/16 0552  . dialysate (PRISMASATE) 2,200 mL/hr at 10/16/16 1459  . sodium chloride 0.9 % 3,000 mL with potassium chloride 12 mEq infusion      OBJECTIVE: Physical Exam: Vitals:   10/16/16 0900 10/16/16 1000 10/16/16 1100 10/16/16 1200  BP:      Pulse: 76 73 77 77  Resp: (!) 21 (!) 22 (!) 21 (!) 26  Temp: 98.1 F (36.7 C) 97.7 F (36.5 C) 97.7 F (36.5 C) 97.7 F (36.5 C)  TempSrc:    Core (Comment)  SpO2: 96% 100% 100% 100%  Weight:      Height:        Intake/Output Summary (Last 24 hours) at 10/16/16 1556 Last data filed at 10/16/16 1500  Gross per 24 hour  Intake          2979.62 ml  Output             7147 ml  Net         -4167.38 ml    Telemetry reviewed by myself, no further WC rhythm since device reprogramming (device interrogation noted ATach)  GEN- The patient is ill appearing, alert and oriented x 3 today.   Head- normocephalic, atraumatic Eyes-  Sclera clear, conjunctiva pink Ears- hearing intact Oropharynx- clear Neck- supple, no JVP Lungs- Clear to ausculation bilaterally, normal work of breathing Heart- Regular rate and rhythm, no significant murmurs, no rubs or gallops GI- soft, NT, ND Extremities- no clubbing, cyanosis, or edema Skin- no rash or lesion Psych- euthymic mood, full  affect Neuro- no gross deficits appreciated  Trialysis catheter and IABP in place  LABS: Basic Metabolic Panel:  Recent Labs  09/81/1911/20/17 0537 10/15/16 1622 10/16/16 0400  NA 129* 133* 129*  K 4.2 4.2 4.2  CL 94* 97* 95*  CO2 23 25 23   GLUCOSE 145* 133* 139*  BUN 20 14 18   CREATININE 2.23* 1.38* 1.94*  CALCIUM 8.0* 8.4* 8.3*  MG 2.5*  --  2.5*  PHOS 3.1 1.8* 2.4*   Liver Function Tests:  Recent Labs  10/15/16 1622 10/16/16 0400  ALBUMIN 3.3* 3.0*   No results for input(s): LIPASE, AMYLASE in the last 72 hours. CBC:  Recent Labs  10/15/16 0537 10/16/16 0400  WBC 16.4* 11.2*  HGB 14.1 13.8  HCT 40.7 38.7*  MCV 83.2 82.2  PLT 102* 95*    RADIOLOGY: Dg Chest Port 1 View Result Date: 10/14/2016 CLINICAL DATA:  Fever, chills. EXAM: PORTABLE CHEST 1 VIEW COMPARISON:  09/28/2016 FINDINGS: Left pacer remains in place, unchanged. Right Swan-Ganz catheter is in place with the tip in the right lower lobe pulmonary artery. There is marked cardiomegaly with vascular congestion. No overt failure. Probable left lower lobe compressive atelectasis. No definite effusion. IMPRESSION: Swan-Ganz catheter in the central right lower  lobe pulmonary artery. Marked cardiomegaly and vascular congestion. Electronically Signed   By: Charlett Nose M.D.   On: 10/14/2016 07:24     ASSESSMENT AND PLAN:   1. Septic/cardiogenic shock w/MSOF      IABP remains 1;1, augmented SBP 90's     On Dobutamine gtt and levophed gtt   Hyponatremic 129 Leukocytosis down some to 11.2 Lactic acid 2.1 INR down to 2.13 H/H 14/40 plts 95  CO 3L/min  Cumulative fluid negative: -11,141.47ml  2. + BC with gram + cocci      Staph      abx changed to ancef      Appreciate ID input  ICD in place, BSCi implanted 2010       3. VT None further  Device interrogation today notes wide complex rhythm appears to be an ATach with paced/tracking rhythm, device reprogrammed at the direction of Dr. Graciela Husbands to DDI 60  from DDD 40  4. ARF     On CRRT     Continue current management with CHF and ICU team, nephrology    Francis Dowse, PA-C 10/16/2016 3:56 PM

## 2016-10-17 ENCOUNTER — Ambulatory Visit (HOSPITAL_COMMUNITY): Payer: Medicaid Other

## 2016-10-17 DIAGNOSIS — B9561 Methicillin susceptible Staphylococcus aureus infection as the cause of diseases classified elsewhere: Secondary | ICD-10-CM

## 2016-10-17 DIAGNOSIS — A419 Sepsis, unspecified organism: Secondary | ICD-10-CM

## 2016-10-17 DIAGNOSIS — R7881 Bacteremia: Secondary | ICD-10-CM

## 2016-10-17 DIAGNOSIS — I471 Supraventricular tachycardia: Secondary | ICD-10-CM

## 2016-10-17 DIAGNOSIS — R6521 Severe sepsis with septic shock: Secondary | ICD-10-CM

## 2016-10-17 LAB — COOXEMETRY PANEL
CARBOXYHEMOGLOBIN: 0.5 % (ref 0.5–1.5)
Carboxyhemoglobin: 0.9 % (ref 0.5–1.5)
METHEMOGLOBIN: 0.7 % (ref 0.0–1.5)
METHEMOGLOBIN: 0.9 % (ref 0.0–1.5)
O2 Saturation: 34.3 %
O2 Saturation: 30.6 %
TOTAL HEMOGLOBIN: 14.1 g/dL (ref 12.0–16.0)
Total hemoglobin: 13.8 g/dL (ref 12.0–16.0)

## 2016-10-17 LAB — RENAL FUNCTION PANEL
ALBUMIN: 2.8 g/dL — AB (ref 3.5–5.0)
ANION GAP: 13 (ref 5–15)
ANION GAP: 11 (ref 5–15)
Albumin: 3 g/dL — ABNORMAL LOW (ref 3.5–5.0)
BUN: 15 mg/dL (ref 6–20)
BUN: 16 mg/dL (ref 6–20)
CHLORIDE: 97 mmol/L — AB (ref 101–111)
CHLORIDE: 99 mmol/L — AB (ref 101–111)
CO2: 17 mmol/L — AB (ref 22–32)
CO2: 18 mmol/L — AB (ref 22–32)
Calcium: 8.3 mg/dL — ABNORMAL LOW (ref 8.9–10.3)
Calcium: 8.3 mg/dL — ABNORMAL LOW (ref 8.9–10.3)
Creatinine, Ser: 1.84 mg/dL — ABNORMAL HIGH (ref 0.61–1.24)
Creatinine, Ser: 1.92 mg/dL — ABNORMAL HIGH (ref 0.61–1.24)
GFR calc Af Amer: 45 mL/min — ABNORMAL LOW (ref >60)
GFR calc non Af Amer: 39 mL/min — ABNORMAL LOW (ref >60)
GFR, EST AFRICAN AMERICAN: 48 mL/min — AB (ref >60)
GFR, EST NON AFRICAN AMERICAN: 41 mL/min — AB (ref >60)
GLUCOSE: 150 mg/dL — AB (ref 65–99)
Glucose, Bld: 126 mg/dL — ABNORMAL HIGH (ref 65–99)
PHOSPHORUS: 2.8 mg/dL (ref 2.5–4.6)
POTASSIUM: 4.4 mmol/L (ref 3.5–5.1)
POTASSIUM: 5 mmol/L (ref 3.5–5.1)
Phosphorus: 2.4 mg/dL — ABNORMAL LOW (ref 2.5–4.6)
Sodium: 127 mmol/L — ABNORMAL LOW (ref 135–145)
Sodium: 128 mmol/L — ABNORMAL LOW (ref 135–145)

## 2016-10-17 LAB — GLUCOSE, CAPILLARY
GLUCOSE-CAPILLARY: 153 mg/dL — AB (ref 65–99)
GLUCOSE-CAPILLARY: 170 mg/dL — AB (ref 65–99)
GLUCOSE-CAPILLARY: 181 mg/dL — AB (ref 65–99)

## 2016-10-17 LAB — CBC
HEMATOCRIT: 35.7 % — AB (ref 39.0–52.0)
HEMOGLOBIN: 12.7 g/dL — AB (ref 13.0–17.0)
MCH: 28.6 pg (ref 26.0–34.0)
MCHC: 35.6 g/dL (ref 30.0–36.0)
MCV: 80.4 fL (ref 78.0–100.0)
Platelets: 96 10*3/uL — ABNORMAL LOW (ref 150–400)
RBC: 4.44 MIL/uL (ref 4.22–5.81)
RDW: 20.3 % — ABNORMAL HIGH (ref 11.5–15.5)
WBC: 8.1 10*3/uL (ref 4.0–10.5)

## 2016-10-17 LAB — HEPARIN LEVEL (UNFRACTIONATED): HEPARIN UNFRACTIONATED: 0.23 [IU]/mL — AB (ref 0.30–0.70)

## 2016-10-17 LAB — PROTIME-INR
INR: 2.41
PROTHROMBIN TIME: 26.7 s — AB (ref 11.4–15.2)

## 2016-10-17 LAB — MAGNESIUM: Magnesium: 1.8 mg/dL (ref 1.7–2.4)

## 2016-10-17 MED ORDER — MAGNESIUM SULFATE 2 GM/50ML IV SOLN
2.0000 g | Freq: Once | INTRAVENOUS | Status: AC
  Administered 2016-10-17: 2 g via INTRAVENOUS
  Filled 2016-10-17: qty 50

## 2016-10-17 MED ORDER — RIFAMPIN 300 MG PO CAPS
300.0000 mg | ORAL_CAPSULE | Freq: Two times a day (BID) | ORAL | Status: DC
  Administered 2016-10-17 – 2016-10-18 (×2): 300 mg via ORAL
  Filled 2016-10-17 (×2): qty 1

## 2016-10-17 MED ORDER — AMIODARONE HCL IN DEXTROSE 360-4.14 MG/200ML-% IV SOLN
30.0000 mg/h | INTRAVENOUS | Status: AC
  Administered 2016-10-18 – 2016-10-19 (×4): 30 mg/h via INTRAVENOUS
  Filled 2016-10-17 (×4): qty 200

## 2016-10-17 NOTE — Progress Notes (Signed)
Assessment/ Plan:   Pt is a 89M with a PMH sig for NICM with EF 10% (04/2016), ICD, h/o LV thrombus, HTN, and atrial fibrillation who has acute oliguric kidney injury in the setting of florid cardiogenic shock.   1. Cardiogenic shock 2/2 acute systolic heart failure exac: pressors, EF 10%. Swan, IABP  2. AKI on CKD/ vol overload: baseline cr 1.2-1.4. Oliguric .Tolerating UF well,Aa 100-200/ hr, continue CRRT. w/some saline due to hyponatremia 3. Vtach: on amio.  4. Staph aureus bacteremia 11/19 Dispo: Cont CRRT w/ 4k baths, ?candidate for cardiac transplant  Subjective: Interval History: none.  Objective: Vital signs in last 24 hours: Temp:  [96.8 F (36 Johnson)-99 F (37.2 Johnson)] 97 F (36.1 Johnson) (11/22 0900) Pulse Rate:  [72-77] 72 (11/22 0900) Resp:  [19-31] 22 (11/22 0900) SpO2:  [99 %-100 %] 100 % (11/22 0900) Arterial Line BP: (82-122)/(45-75) 99/46 (11/22 0900) Weight:  [74.9 kg (165 lb 2 oz)] 74.9 kg (165 lb 2 oz) (11/22 0300) Weight change: -1.5 kg (-3 lb 4.9 oz)  Intake/Output from previous day: 11/21 0701 - 11/22 0700 In: 6519.4 [P.O.:950; I.V.:5369.4; IV Piggyback:200] Out: 8514  Intake/Output this shift: Total I/O In: 1467.9 [P.O.:360; I.V.:957.9; IV Piggyback:150] Out: 1250 [Urine:300; Other:950]  PE: alert, otherwise no change  Lab Results:  Recent Labs  10/16/16 0400 10/17/16 0330  WBC 11.2* 8.1  HGB 13.8 12.7*  HCT 38.7* 35.7*  PLT 95* 96*   BMET:  Recent Labs  10/16/16 1600 10/17/16 0330  NA 129* 127*  K 4.2 4.4  CL 96* 97*  CO2 23 17*  GLUCOSE 100* 126*  BUN 17 15  CREATININE 1.90* 1.84*  CALCIUM 8.2* 8.3*   No results for input(s): PTH in the last 72 hours. Iron Studies: No results for input(s): IRON, TIBC, TRANSFERRIN, FERRITIN in the last 72 hours. Studies/Results: No results found.  Scheduled: .  ceFAZolin (ANCEF) IV  2 g Intravenous Q12H  . insulin aspart  0-5 Units Subcutaneous QHS  . insulin aspart  0-9 Units Subcutaneous  TID WC  . magnesium sulfate 1 - 4 g bolus IVPB  2 g Intravenous Once  . sodium chloride flush  10-40 mL Intracatheter Q12H  . sodium chloride flush  3 mL Intravenous Q12H     LOS: 7 days   Parker Johnson 10/17/2016,10:10 AM

## 2016-10-17 NOTE — Progress Notes (Addendum)
Patient Name: Parker Johnson      SUBJECTIVE: feels better today but numbers..... Afebrile   Past Medical History:  Diagnosis Date  . AICD (automatic cardioverter/defibrillator) present    [employer] 100  . Alcohol abuse    hx  . Cardiomyopathy    secondary  . CHF (congestive heart failure) (HCC)   . Chronic anticoagulation    Coumadin for hx LV thrombus and PAF  . Gout   . HTN (hypertension)    uncontrolled  . Tobacco abuse   . Ventricular tachycardia (HCC)     Scheduled Meds:  Scheduled Meds: .  ceFAZolin (ANCEF) IV  2 g Intravenous Q12H  . insulin aspart  0-5 Units Subcutaneous QHS  . insulin aspart  0-9 Units Subcutaneous TID WC  . magnesium sulfate 1 - 4 g bolus IVPB  2 g Intravenous Once  . sodium chloride flush  10-40 mL Intracatheter Q12H  . sodium chloride flush  3 mL Intravenous Q12H   Continuous Infusions: . sodium chloride 10 mL/hr at 10/16/16 0700  . amiodarone 60 mg/hr (10/17/16 0447)  . DOBUTamine 7 mcg/kg/min (10/16/16 2329)  . heparin 1,550 Units/hr (10/16/16 2000)  . norepinephrine (LEVOPHED) Adult infusion 45 mcg/min (10/17/16 0447)  . dialysis replacement fluid (prismasate) 300 mL/hr at 10/16/16 2329  . dialysate (PRISMASATE) 2,200 mL/hr at 10/17/16 0732  . sodium chloride 0.9 % 3,000 mL with potassium chloride 12 mEq infusion 200 mL/hr at 10/16/16 1645   sodium chloride, acetaminophen, heparin, ondansetron (ZOFRAN) IV, sodium chloride, sodium chloride flush, sodium chloride flush, traMADol    PHYSICAL EXAM Vitals:   10/17/16 0434 10/17/16 0500 10/17/16 0600 10/17/16 0700  BP:      Pulse: 75 74 74 73  Resp: (!) 26 19 (!) 26 (!) 27  Temp: 98.1 F (36.7 C) 98.4 F (36.9 C) 98.1 F (36.7 C) 97.3 F (36.3 C)  TempSrc:      SpO2: 100% 99% 100% 100%  Weight:      Height:       Well developed and nourished in mild distress wearing o2 HENT normal Neck supple with JVP-flat Clear Regular rate and rhythm, no  murmurs or gallops Abd-soft with active BS No Clubbing cyanosis edema Skin-warm and dry A & Oriented  Grossly normal sensory and motor function  TELEMETRY: Reviewed personnally pt in atach with condiucted rhtyhm 2:`1      Intake/Output Summary (Last 24 hours) at 10/17/16 0813 Last data filed at 10/17/16 0800  Gross per 24 hour  Intake          6096.51 ml  Output             8518 ml  Net         -2421.49 ml    LABS: Basic Metabolic Panel:  Recent Labs Lab 10/14/16 0339 10/14/16 1620 10/15/16 0537 10/15/16 1622 10/16/16 0400 10/16/16 1600 10/17/16 0330  NA 130* 130* 129* 133* 129* 129* 127*  K 5.5* 4.8 4.2 4.2 4.2 4.2 4.4  CL 94* 95* 94* 97* 95* 96* 97*  CO2 25 24 23 25 23 23  17*  GLUCOSE 104* 108* 145* 133* 139* 100* 126*  BUN 25* 22* 20 14 18 17 15   CREATININE 2.34* 2.32* 2.23* 1.38* 1.94* 1.90* 1.84*  CALCIUM 8.4* 8.0* 8.0* 8.4* 8.3* 8.2* 8.3*  MG 2.7*  --  2.5*  --  2.5*  --  1.8  PHOS 3.8 3.5 3.1 1.8* 2.4* 2.4* 2.4*  Cardiac Enzymes: No results for input(s): CKTOTAL, CKMB, CKMBINDEX, TROPONINI in the last 72 hours. CBC:  Recent Labs Lab 04-07-2016 0957 10/19/2016 2136 10/13/16 0544 10/14/16 0100 10/15/16 0537 10/16/16 0400 10/17/16 0330  WBC 6.6 5.7 6.0 8.8 16.4* 11.2* 8.1  NEUTROABS 3.1  --   --   --   --   --   --   HGB 14.7 12.8* 12.9* 13.7 14.1 13.8 12.7*  HCT 43.9 36.7* 37.9* 39.7 40.7 38.7* 35.7*  MCV 87.8 84.4 84.4 85.2 83.2 82.2 80.4  PLT 169 138* 129* 122* 102* 95* 96*   PROTIME:  Recent Labs  10/15/16 0537 10/16/16 0400 10/17/16 0330  LABPROT 33.0* 24.2* 26.7*  INR 3.14 2.13 2.41   Liver Function Tests:  Recent Labs  10/16/16 1600 10/17/16 0330  ALBUMIN 2.8* 3.0*   No results for input(s): LIPASE, AMYLASE in the last 72 hours. BNP: BNP (last 3 results)  Recent Labs  05/16/16 0420 06/21/16 1218 04-07-2016 0957  BNP 2,453.6* 2,504.3* 3,542.3*    ProBNP (last 3 results) No results for input(s): PROBNP in the last 8760  hours.   *   ASSESSMENT AND PLAN:  Active Problems:   Ventricular fibrillation (HCC)   Cardiogenic shock (HCC)   AKI (acute kidney injury) (HCC)   Septic shock (HCC)   Staphylococcus aureus bacteremia   Atrial tachycardia (HCC)  Decrease amio Hemodynamic support per CHF Reviewed prognosis,  He is better but his numbers are not, with CO about 3-4 CVP about 28 WBC down  Some UO last pm  Signed, Sherryl MangesSteven Klein MD  10/17/2016

## 2016-10-17 NOTE — Progress Notes (Signed)
Pharmacy Antibiotic Note  Parker Johnson is a 50 y.o. male admitted on 06-Nov-2016 with cardiogenic shock s/p IABP placement found to have blood cultures positive for MSSA bacteremia.  Pharmacy has been consulted for cefazolin dosing. Of note, pt had CRRT started on 11/17 for fluid removal. WBC trending down to 8.1, currently afebrile, and repeat blood cultures NGTD x12 hours. Pt has multiple lines that are unable to be discontinued at this time due to need for vasopressor and inotrope support. ID team is following patient.  Plan: -Continue cefazolin 2gm IV q12h -Follow-up repeat cultures, length of therapy, and changes in renal function  Height: 5\' 7"  (170.2 cm) Weight: 165 lb 2 oz (74.9 kg) IBW/kg (Calculated) : 66.1  Temp (24hrs), Avg:98 F (36.7 C), Min:96.8 F (36 C), Max:99 F (37.2 C)   Recent Labs Lab 10/15/2016 2137 10/13/16 0544  10/14/16 0100  10/14/16 0416 10/14/16 1202  10/15/16 0537 10/15/16 1120 10/15/16 1622 10/16/16 0400 10/16/16 1200 10/16/16 1600 10/17/16 0330  WBC  --  6.0  --  8.8  --   --   --   --  16.4*  --   --  11.2*  --   --  8.1  CREATININE  --  2.70*  2.66*  < >  --   < >  --   --   < > 2.23*  --  1.38* 1.94*  --  1.90* 1.84*  LATICACIDVEN 1.9  --   --   --   --  1.6 2.2*  --   --  2.0*  --   --  2.1*  --   --   < > = values in this interval not displayed.  Estimated Creatinine Clearance: 44.9 mL/min (by C-G formula based on SCr of 1.84 mg/dL (H)).    No Known Allergies  Antimicrobials this admission:  Vanc 11/19>11/19 Ceftaz11/19>11/19 Cefazolin 11/19>>  Dose adjustments this admission:  none  Microbiology results:  11/21 BCx: ngtd 11/19 BCx: 2/2 MSSA (BCID confirms) 11/15 MRSA PCR: neg   Thank you for allowing pharmacy to be a part of this patient's care.  Fredonia Highland, PharmD PGY-1 Pharmacy Resident Pager: [phone] 10/17/2016

## 2016-10-17 NOTE — Progress Notes (Signed)
Patient ID: Parker Johnson, male   DOB: 07/31/1966, 50 y.o.   MRN: [id number]    Advanced Heart Failure Rounding Note   Subjective:    Admitted with VT/syncope and profound cardiogenic shock. PICC placed with initial CO-OX 26%.   Underwent IABP and Trialysis cath placement on 11/17  Now on dobutamine 5 and norepi 50. IV amio.   On 11/19 developed sepsis. Bcx + for MSSA. Now on Ancef.   No further runs of wide "V Paced" rhythm.  Occasional PVCs.   Feeling "great" this morning per patient. Feels well rested. No complaints currently. CVVHD pulling > 200/hr. CVP remains > 20. 25 this am.   Co-ox 34.3%. On Dobutamine 7 and norepi 40.   K stable. Mg 1.8.    INR 2.41  Swan this morning 0700 PA 28/23 CVP 22 PCWP 25 CO 4 CI 2    Objective:   Weight Range:  Vital Signs:   Temp:  [96.8 F (36 C)-99 F (37.2 C)] 97.3 F (36.3 C) (11/22 0700) Pulse Rate:  [73-148] 73 (11/22 0700) Resp:  [19-31] 27 (11/22 0700) SpO2:  [96 %-100 %] 100 % (11/22 0700) Arterial Line BP: (82-122)/(45-75) 114/46 (11/22 0700) Weight:  [165 lb 2 oz (74.9 kg)] 165 lb 2 oz (74.9 kg) (11/22 0300) Last BM Date: 27-Oct-2016  Weight change: Filed Weights   10/15/16 0238 10/16/16 0500 10/17/16 0300  Weight: 177 lb 7.5 oz (80.5 kg) 168 lb 6.9 oz (76.4 kg) 165 lb 2 oz (74.9 kg)    Intake/Output:   Intake/Output Summary (Last 24 hours) at 10/17/16 0737 Last data filed at 10/17/16 0700  Gross per 24 hour  Intake          6252.61 ml  Output             8514 ml  Net         -2261.39 ml     Physical Exam: CVP 25  General:  Lying in bed. Critically ill and fatigued appearing.  HEENT: Normal Neck: supple. JVP remains elevated to ear. RIJ swan . Carotids 2+ bilat; no bruits. No thyromegaly or nodule noted.  Cor: PMI nondisplaced. RRR. No murmurs or rubs. +S3 Lungs: Anterior crackles.  Abdomen: soft, NT, ND, no HSM. No bruits or masses. +BS  Extremities: no cyanosis, clubbing, rash  RUE PICC. R and LLE trace  to 1+ edema. Ted hose present. R femoral trialysis cath and IABP. Not cool to the touch. Neuro: awake. Follows commands cranial nerves grossly intact. moves all 4 extremities w/o difficulty.   Telemetry: Reviewed, NSR with occasional PVCs  Labs: Basic Metabolic Panel:  Recent Labs Lab 10/13/16 0544  10/14/16 0339  10/15/16 0537 10/15/16 1622 10/16/16 0400 10/16/16 1600 10/17/16 0330  NA 128*  129*  < > 130*  < > 129* 133* 129* 129* 127*  K 5.1  5.0  < > 5.5*  < > 4.2 4.2 4.2 4.2 4.4  CL 89*  90*  < > 94*  < > 94* 97* 95* 96* 97*  CO2 26  26  < > 25  < > 23 25 23 23  17*  GLUCOSE 150*  146*  < > 104*  < > 145* 133* 139* 100* 126*  BUN 34*  33*  < > 25*  < > 20 14 18 17 15   CREATININE 2.70*  2.66*  < > 2.34*  < > 2.23* 1.38* 1.94* 1.90* 1.84*  CALCIUM 8.2*  8.1*  < > 8.4*  < >  8.0* 8.4* 8.3* 8.2* 8.3*  MG 2.7*  --  2.7*  --  2.5*  --  2.5*  --  1.8  PHOS 4.0  < > 3.8  < > 3.1 1.8* 2.4* 2.4* 2.4*  < > = values in this interval not displayed.  Liver Function Tests:  Recent Labs Lab 05-28-2016 0957 10/11/16 0422 10/01/2016 0500  10/15/16 0537 10/15/16 1622 10/16/16 0400 10/16/16 1600 10/17/16 0330  AST 31 62* 287*  --   --   --   --   --   --   ALT 16* 39 184*  --   --   --   --   --   --   ALKPHOS 88 86 82  --   --   --   --   --   --   BILITOT 2.8* 2.8* 2.5*  --   --   --   --   --   --   PROT 7.5 6.8 7.4  --   --   --   --   --   --   ALBUMIN 3.3* 3.1* 3.3*  < > 3.0* 3.3* 3.0* 2.8* 3.0*  < > = values in this interval not displayed. No results for input(s): LIPASE, AMYLASE in the last 168 hours. No results for input(s): AMMONIA in the last 168 hours.  CBC:  Recent Labs Lab 05-28-2016 0957  10/13/16 0544 10/14/16 0100 10/15/16 0537 10/16/16 0400 10/17/16 0330  WBC 6.6  < > 6.0 8.8 16.4* 11.2* 8.1  NEUTROABS 3.1  --   --   --   --   --   --   HGB 14.7  < > 12.9* 13.7 14.1 13.8 12.7*  HCT 43.9  < > 37.9* 39.7 40.7 38.7* 35.7*  MCV 87.8  < > 84.4 85.2 83.2  82.2 80.4  PLT 169  < > 129* 122* 102* 95* 96*  < > = values in this interval not displayed.  Cardiac Enzymes: No results for input(s): CKTOTAL, CKMB, CKMBINDEX, TROPONINI in the last 168 hours.  BNP: BNP (last 3 results)  Recent Labs  05/16/16 0420 06/21/16 1218 05-28-2016 0957  BNP 2,453.6* 2,504.3* 3,542.3*    ProBNP (last 3 results) No results for input(s): PROBNP in the last 8760 hours.    Other results:  Imaging: No results found.   Medications:     Scheduled Medications: .  ceFAZolin (ANCEF) IV  2 g Intravenous Q12H  . insulin aspart  0-5 Units Subcutaneous QHS  . insulin aspart  0-9 Units Subcutaneous TID WC  . sodium chloride flush  10-40 mL Intracatheter Q12H  . sodium chloride flush  3 mL Intravenous Q12H    Infusions: . sodium chloride 10 mL/hr at 10/16/16 0700  . amiodarone 60 mg/hr (10/17/16 0447)  . DOBUTamine 7 mcg/kg/min (10/16/16 2329)  . heparin 1,550 Units/hr (10/16/16 2000)  . norepinephrine (LEVOPHED) Adult infusion 45 mcg/min (10/17/16 0447)  . dialysis replacement fluid (prismasate) 300 mL/hr at 10/16/16 2329  . dialysate (PRISMASATE) 2,200 mL/hr at 10/17/16 0732  . sodium chloride 0.9 % 3,000 mL with potassium chloride 12 mEq infusion 200 mL/hr at 10/16/16 1645    PRN Medications: sodium chloride, acetaminophen, heparin, ondansetron (ZOFRAN) IV, sodium chloride, sodium chloride flush, sodium chloride flush, traMADol   Assessment/Plan/Discussion    1. Syncope: VT [employer].  Device interrogation showed multiple episodes of VT. Loading amio per EP. K repleted. No further VT over the last day. EP following 2. A/C  Systolic Biventricular Heart Failure (end-stage) -> Cardiogenic Shock.  NICM. ECHO this admission EF 10% with severe RV dilation and dysfunction. BNP >3000. On admit he had marked volume overload with prominent S3.  - He remains volume overloaded. CVVHD pulling as tolerated. Currently getting > 200 ml/hr - Coox markedly  depressed. Continue dobutamine at 7 mcg.   - IABP augmenting normally.  - Continue current IABP settings and norepinephrine.  - Continue CVVH, pulling fluid as BP tolerates.  - Not candidate for VAD or transplant so have not pursued ECMO.   3. PAF:  Maintaining NSR on amio.  On heparin with IABP.  4. H/O LV thrombus 5. Acute on CKD III:  Anuric. Now on CVVHD (started 11/17).  - Marked volume overload. Continue to pull as BP tolerates.  6. H/O ETOH  7. H/O tobacco abuse 8. Hyperkalemia: CVVH.  9. MSSA sepsis  - Now on Ancef   Pt remains critically ill with extremely guarded prognosis.   Length of Stay: [address]  Luane [employer] 10/17/2016, 7:37 AM  Advanced Heart Failure Team Pager [phone] (M-F; 7a - 4p)  Please contact [facility] for night-coverage after hours (4p -7a ) and weekends on [email or web address]   Patient seen and examined with Otilio Saber, PA-C. We discussed all aspects of the encounter. I agree with the assessment and plan as stated above.   He feels better but cardiac indices remain poor despite max support and 15 pound fluid removal. Will continue CVVHD to try to get CVP down further (still high 20s). Increase dobutamine to 7 and wean norepi slowly. Will try to remove IABP in next 48 hours. If unable to tolerate IABP wean will need to switch to palliative care. Rhythm more stable. Dr. Graciela Husbands decreasing amio. (we discussed at bedside). Continue ancef for MSSA bacteremia.   The patient is critically ill with multiple organ systems failure and requires high complexity decision making for assessment and support, frequent evaluation and titration of therapies, application of advanced monitoring technologies and extensive interpretation of multiple databases.   Critical Care Time devoted to patient care services described in this note is 35 Minutes.  Shoua Ulloa,MD 9:31 AM

## 2016-10-17 NOTE — Progress Notes (Signed)
Pt. amiodarone rate changed 11/22 1700 from 78ml/hr to 85ml/hr.  The 30 ml/hr was only scheduled to run for 6 hrs.  Cards fellow paged and said to continue amiodarone and they can re-assess in the morning.  Will continue amio and continue to monitor pt.

## 2016-10-17 NOTE — Progress Notes (Signed)
ANTICOAGULATION CONSULT NOTE   Pharmacy Consult for heparin Indication: IABP/PAF  No Known Allergies  Patient Measurements: Height: 5\' 7"  (170.2 cm) Weight: 165 lb 2 oz (74.9 kg) IBW/kg (Calculated) : 66.1 Heparin Dosing Weight: 80.7 kg  Vital Signs: Temp: 98.1 F (36.7 C) (11/22 0600) Temp Source: Core (Comment) (11/22 0000) Pulse Rate: 74 (11/22 0600)  Labs:  Recent Labs  10/15/16 0537  10/16/16 0400 10/16/16 1115 10/16/16 1600 10/16/16 2015 10/17/16 0330  HGB 14.1  --  13.8  --   --   --  12.7*  HCT 40.7  --  38.7*  --   --   --  35.7*  PLT 102*  --  95*  --   --   --  96*  LABPROT 33.0*  --  24.2*  --   --   --  26.7*  INR 3.14  --  2.13  --   --   --  2.41  HEPARINUNFRC 0.20*  --  0.14* 0.18*  --  0.26* 0.23*  CREATININE 2.23*  < > 1.94*  --  1.90*  --  1.84*  < > = values in this interval not displayed.  Estimated Creatinine Clearance: 44.9 mL/min (by C-G formula based on SCr of 1.84 mg/dL (H)).   Medical History: Past Medical History:  Diagnosis Date  . AICD (automatic cardioverter/defibrillator) present    [employer]  . Alcohol abuse    hx  . Cardiomyopathy    secondary  . CHF (congestive heart failure) (HCC)   . Chronic anticoagulation    Coumadin for hx LV thrombus and PAF  . Gout   . HTN (hypertension)    uncontrolled  . Tobacco abuse   . Ventricular tachycardia [facility])     Assessment: 50 yo M on warfarin PTA now held (last dose 11/15) and started on low-dose heparin drip per Dr. Gala Romney s/p IABP placement on 11/17 for cardiogenic shock/Afib. INR on admit was 6.86 in the setting of shock liver and pt was given vitamin k 5mg  IV and FFP prior to cath lab. MD initially requested very low infusion rate of heparin at 500 units/hr given elevated INR and high likelihood of further procedures, now titrating heparin to goal of 0.2-0.5. Heparin currently therapeutic at 0.23, CBC low but stable, no S/Sx bleeding noted.  Goal of  Therapy:  Heparin level 0.2-0.5 units/ml Monitor platelets by anticoagulation protocol: Yes   Plan:  -Continue heparin at 1550 units/hr -Monitor heparin level, CBC, S/Sx bleeding daily  Fredonia Highland, PharmD PGY-1 Pharmacy Resident Pager: [phone] 10/17/2016

## 2016-10-18 DIAGNOSIS — J984 Other disorders of lung: Secondary | ICD-10-CM

## 2016-10-18 DIAGNOSIS — T80219A Unspecified infection due to central venous catheter, initial encounter: Secondary | ICD-10-CM

## 2016-10-18 DIAGNOSIS — J918 Pleural effusion in other conditions classified elsewhere: Secondary | ICD-10-CM

## 2016-10-18 DIAGNOSIS — Z9852 Vasectomy status: Secondary | ICD-10-CM

## 2016-10-18 DIAGNOSIS — N179 Acute kidney failure, unspecified: Secondary | ICD-10-CM

## 2016-10-18 DIAGNOSIS — Z9049 Acquired absence of other specified parts of digestive tract: Secondary | ICD-10-CM

## 2016-10-18 DIAGNOSIS — Z85828 Personal history of other malignant neoplasm of skin: Secondary | ICD-10-CM

## 2016-10-18 DIAGNOSIS — R55 Syncope and collapse: Secondary | ICD-10-CM

## 2016-10-18 DIAGNOSIS — T827XXA Infection and inflammatory reaction due to other cardiac and vascular devices, implants and grafts, initial encounter: Secondary | ICD-10-CM

## 2016-10-18 DIAGNOSIS — J852 Abscess of lung without pneumonia: Secondary | ICD-10-CM

## 2016-10-18 DIAGNOSIS — R509 Fever, unspecified: Secondary | ICD-10-CM

## 2016-10-18 LAB — RENAL FUNCTION PANEL
ALBUMIN: 2.9 g/dL — AB (ref 3.5–5.0)
Albumin: 3 g/dL — ABNORMAL LOW (ref 3.5–5.0)
Anion gap: 12 (ref 5–15)
Anion gap: 8 (ref 5–15)
BUN: 20 mg/dL (ref 6–20)
BUN: 23 mg/dL — AB (ref 6–20)
CALCIUM: 8.5 mg/dL — AB (ref 8.9–10.3)
CALCIUM: 8.3 mg/dL — AB (ref 8.9–10.3)
CHLORIDE: 98 mmol/L — AB (ref 101–111)
CO2: 16 mmol/L — ABNORMAL LOW (ref 22–32)
CO2: 22 mmol/L (ref 22–32)
CREATININE: 2.28 mg/dL — AB (ref 0.61–1.24)
CREATININE: 2.41 mg/dL — AB (ref 0.61–1.24)
Chloride: 96 mmol/L — ABNORMAL LOW (ref 101–111)
GFR calc Af Amer: 34 mL/min — ABNORMAL LOW (ref >60)
GFR calc non Af Amer: 30 mL/min — ABNORMAL LOW (ref >60)
GFR, EST AFRICAN AMERICAN: 37 mL/min — AB (ref >60)
GFR, EST NON AFRICAN AMERICAN: 32 mL/min — AB (ref >60)
GLUCOSE: 114 mg/dL — AB (ref 65–99)
Glucose, Bld: 169 mg/dL — ABNORMAL HIGH (ref 65–99)
PHOSPHORUS: 2.8 mg/dL (ref 2.5–4.6)
PHOSPHORUS: 3 mg/dL (ref 2.5–4.6)
Potassium: 4.7 mmol/L (ref 3.5–5.1)
Potassium: 4.6 mmol/L (ref 3.5–5.1)
SODIUM: 126 mmol/L — AB (ref 135–145)
Sodium: 126 mmol/L — ABNORMAL LOW (ref 135–145)

## 2016-10-18 LAB — CBC
HCT: 37.8 % — ABNORMAL LOW (ref 39.0–52.0)
HEMOGLOBIN: 13.7 g/dL (ref 13.0–17.0)
MCH: 28.8 pg (ref 26.0–34.0)
MCHC: 36.2 g/dL — AB (ref 30.0–36.0)
MCV: 79.4 fL (ref 78.0–100.0)
PLATELETS: 110 10*3/uL — AB (ref 150–400)
RBC: 4.76 MIL/uL (ref 4.22–5.81)
RDW: 20.3 % — AB (ref 11.5–15.5)
WBC: 8.3 10*3/uL (ref 4.0–10.5)

## 2016-10-18 LAB — MAGNESIUM: MAGNESIUM: 2.5 mg/dL — AB (ref 1.7–2.4)

## 2016-10-18 LAB — COOXEMETRY PANEL
Carboxyhemoglobin: 0.5 % (ref 0.5–1.5)
METHEMOGLOBIN: 0.9 % (ref 0.0–1.5)
O2 Saturation: 35.2 %
Total hemoglobin: 15.3 g/dL (ref 12.0–16.0)

## 2016-10-18 LAB — PROTIME-INR
INR: 2.03
Prothrombin Time: 23.3 seconds — ABNORMAL HIGH (ref 11.4–15.2)

## 2016-10-18 LAB — GLUCOSE, CAPILLARY
Glucose-Capillary: 138 mg/dL — ABNORMAL HIGH (ref 65–99)
Glucose-Capillary: 131 mg/dL — ABNORMAL HIGH (ref 65–99)
Glucose-Capillary: 109 mg/dL — ABNORMAL HIGH (ref 65–99)
Glucose-Capillary: 180 mg/dL — ABNORMAL HIGH (ref 65–99)

## 2016-10-18 LAB — HEPARIN LEVEL (UNFRACTIONATED): HEPARIN UNFRACTIONATED: 0.41 [IU]/mL (ref 0.30–0.70)

## 2016-10-18 MED ORDER — SODIUM BICARBONATE 8.4 % IV SOLN
INTRAVENOUS | Status: DC
  Administered 2016-10-18 – 2016-10-19 (×3): via INTRAVENOUS
  Filled 2016-10-18 (×6): qty 1000

## 2016-10-18 MED ORDER — SODIUM CHLORIDE 0.9 % IV SOLN
INTRAVENOUS | Status: DC
  Administered 2016-10-18 (×2): 200 mL/h via INTRAVENOUS
  Administered 2016-10-19 – 2016-10-20 (×8): via INTRAVENOUS
  Administered 2016-10-21 (×2): 200 mL/h via INTRAVENOUS
  Administered 2016-10-21 – 2016-10-22 (×2): via INTRAVENOUS

## 2016-10-18 MED ORDER — MAGNESIUM HYDROXIDE 400 MG/5ML PO SUSP: 5.0000 mL | Freq: Every day | ORAL | Status: DC | PRN

## 2016-10-18 MED ORDER — RIFAMPIN 300 MG PO CAPS
300.0000 mg | ORAL_CAPSULE | Freq: Three times a day (TID) | ORAL | Status: DC
  Administered 2016-10-18 – 2016-10-24 (×17): 300 mg via ORAL
  Filled 2016-10-18 (×20): qty 1

## 2016-10-18 MED ORDER — BISACODYL 10 MG RE SUPP
10.0000 mg | Freq: Every day | RECTAL | Status: DC | PRN
Start: 2016-10-18 — End: 2016-11-02
  Administered 2016-10-18: 10 mg via RECTAL
  Filled 2016-10-18 (×2): qty 1

## 2016-10-18 NOTE — Progress Notes (Signed)
Patient ID: Parker Johnson, male   DOB: Dec 16, 1965, 50 y.o.   MRN: [id number]    Advanced Heart Failure Rounding Note   Subjective:    Admitted with VT/syncope and profound cardiogenic shock. PICC placed with initial CO-OX 26%.   Underwent IABP and Trialysis cath placement on 11/17  Now on dobutamine 7 and norepi 35. IV amio.   On 11/19 developed sepsis. Bcx + for MSSA. Now on Ancef.   Feels good this morning. Denies SOB. Appetite ggod. No more urine output. Remains on IABP 1:1 and CVVHD pulling about 150/hr.   Co-ox 35%  INR 2.0 on heparin  Swan this morning (done personally) RA 29 PCWP 19 CI 1.5   Objective:   Weight Range:  Vital Signs:   Temp:  [97.2 F (36.2 C)-97.9 F (36.6 C)] 97.2 F (36.2 C) (11/23 1500) Pulse Rate:  [68-74] 68 (11/23 1500) Resp:  [16-27] 23 (11/23 1500) BP: (94-106)/(43-54) 102/43 (11/23 1500) SpO2:  [99 %-100 %] 100 % (11/23 1500) Arterial Line BP: (86-126)/(40-90) 115/43 (11/23 1500) Weight:  [73.2 kg (161 lb 6 oz)] 73.2 kg (161 lb 6 oz) (11/23 0337) Last BM Date: 10/06/2016  Weight change: Filed Weights   10/16/16 0500 10/17/16 0300 10/18/16 0337  Weight: 76.4 kg (168 lb 6.9 oz) 74.9 kg (165 lb 2 oz) 73.2 kg (161 lb 6 oz)    Intake/Output:   Intake/Output Summary (Last 24 hours) at 10/18/16 1534 Last data filed at 10/18/16 1500  Gross per 24 hour  Intake           5951.6 ml  Output             7568 ml  Net          -1616.4 ml     Physical Exam:  General:  Lying in bed. Critically ill and fatigued appearing.  HEENT: Normal Neck: supple. JVP remains elevated to ear. RIJ swan . Carotids 2+ bilat; no bruits. No thyromegaly or nodule noted.  Cor: PMI nondisplaced. RRR. No murmurs or rubs. +S3 Lungs:clear   Abdomen: soft, NT, ND, no HSM. No bruits or masses. +BS  Extremities: no cyanosis, clubbing, rash  RUE PICC. No edema. Ted hose present. R femoral trialysis cath and IABP.  Neuro: awake. Follows commands cranial nerves  grossly intact. moves all 4 extremities w/o difficulty.   Telemetry: Reviewed, NSR with occasional PVCs  Labs: Basic Metabolic Panel:  Recent Labs Lab 10/14/16 0339  10/15/16 0537  10/16/16 0400 10/16/16 1600 10/17/16 0330 10/17/16 1626 10/18/16 0345  NA 130*  < > 129*  < > 129* 129* 127* 128* 126*  K 5.5*  < > 4.2  < > 4.2 4.2 4.4 5.0 4.7  CL 94*  < > 94*  < > 95* 96* 97* 99* 98*  CO2 25  < > 23  < > 23 23 17* 18* 16*  GLUCOSE 104*  < > 145*  < > 139* 100* 126* 150* 169*  BUN 25*  < > 20  < > 18 17 15 16 20   CREATININE 2.34*  < > 2.23*  < > 1.94* 1.90* 1.84* 1.92* 2.28*  CALCIUM 8.4*  < > 8.0*  < > 8.3* 8.2* 8.3* 8.3* 8.5*  MG 2.7*  --  2.5*  --  2.5*  --  1.8  --  2.5*  PHOS 3.8  < > 3.1  < > 2.4* 2.4* 2.4* 2.8 2.8  < > = values in this interval not displayed.  Liver Function Tests:  Recent Labs Lab 10/09/2016 0500  10/16/16 0400 10/16/16 1600 10/17/16 0330 10/17/16 1626 10/18/16 0345  AST 287*  --   --   --   --   --   --   ALT 184*  --   --   --   --   --   --   ALKPHOS 82  --   --   --   --   --   --   BILITOT 2.5*  --   --   --   --   --   --   PROT 7.4  --   --   --   --   --   --   ALBUMIN 3.3*  < > 3.0* 2.8* 3.0* 2.8* 3.0*  < > = values in this interval not displayed. No results for input(s): LIPASE, AMYLASE in the last 168 hours. No results for input(s): AMMONIA in the last 168 hours.  CBC:  Recent Labs Lab 10/14/16 0100 10/15/16 0537 10/16/16 0400 10/17/16 0330 10/18/16 0345  WBC 8.8 16.4* 11.2* 8.1 8.3  HGB 13.7 14.1 13.8 12.7* 13.7  HCT 39.7 40.7 38.7* 35.7* 37.8*  MCV 85.2 83.2 82.2 80.4 79.4  PLT 122* 102* 95* 96* 110*    Cardiac Enzymes: No results for input(s): CKTOTAL, CKMB, CKMBINDEX, TROPONINI in the last 168 hours.  BNP: BNP (last 3 results)  Recent Labs  05/16/16 0420 06/21/16 1218 10/22/2016 0957  BNP 2,453.6* 2,504.3* 3,542.3*    ProBNP (last 3 results) No results for input(s): PROBNP in the last 8760  hours.    Other results:  Imaging: No results found.   Medications:     Scheduled Medications: .  ceFAZolin (ANCEF) IV  2 g Intravenous Q12H  . insulin aspart  0-5 Units Subcutaneous QHS  . insulin aspart  0-9 Units Subcutaneous TID WC  . rifampin  300 mg Oral Q8H  . sodium chloride flush  10-40 mL Intracatheter Q12H  . sodium chloride flush  3 mL Intravenous Q12H    Infusions: . sodium chloride 10 mL/hr at 10/18/16 1500  . sodium chloride 200 mL/hr at 10/18/16 1500  . amiodarone 30 mg/hr (10/18/16 1500)  . DOBUTamine 8 mcg/kg/min (10/18/16 1510)  . heparin 1,550 Units/hr (10/18/16 1500)  . norepinephrine (LEVOPHED) Adult infusion 34.987 mcg/min (10/18/16 1500)  . dialysis replacement fluid (prismasate) 200 mL/hr at 10/18/16 1322  . dialysate (PRISMASATE) 1,500 mL/hr at 10/18/16 1334    PRN Medications: sodium chloride, acetaminophen, bisacodyl, heparin, magnesium hydroxide, ondansetron (ZOFRAN) IV, sodium chloride, sodium chloride flush, sodium chloride flush, traMADol   Assessment/Plan/Discussion    1. Syncope: VT [employer].  Device interrogation showed multiple episodes of VT. Loading amio per EP. K repleted. No further VT over the last few days. EP following 2. A/C Systolic Biventricular Heart Failure (end-stage) -> Cardiogenic Shock.  NICM. ECHO this admission EF 10% with severe RV dilation and dysfunction. BNP >3000. On admit he had marked volume overload with prominent S3.  - He is down nearly 20 pounds with CVVHD. PCWP now good but RAP remains very high suggestive of end-stage RV failure - Coox markedly depressed. Increased dobutamine at 8 mcg.   - IABP augmenting normally.  - Continue current IABP settings and norepinephrine.  - Continue CVVH, pulling fluid as BP tolerates.  - Not candidate for VAD or transplant so have not pursued ECMO.   3. PAF:  Maintaining NSR on amio.  On heparin with IABP.  4. H/O LV thrombus 5. Acute on CKD III:  Anuric. Now on  CVVHD (started 11/17).  - Marked volume overload. Continue to pull as BP tolerates.  6. H/O ETOH  7. H/O tobacco abuse 8. Hyperkalemia: CVVH.  9. MSSA sepsis  - Now on Ancef   He feels better but cardiac indices remain poor despite max support and 20 pound fluid removal. Main issue currently is end-stage RV failure>>LV failure. I do not think there is a way out for him as he is not transplant candidate.  Will plan continue CVVHD to see if we can get CVP down a bit further. Will try to wean IABP tomorrow and try to support with inotropes. If fails, will need comfort care/  Rhythm more stable on amio. Continue ancef for MSSA bacteremia.   The patient is critically ill with multiple organ systems failure and requires high complexity decision making for assessment and support, frequent evaluation and titration of therapies, application of advanced monitoring technologies and extensive interpretation of multiple databases.   Critical Care Time devoted to patient care services described in this note is 35 Minutes.  Length of Stay: 8  Arvilla Meres, MD 10/18/2016, 3:34 PM  Advanced Heart Failure Team Pager [phone] (M-F; 7a - 4p)  Please contact [facility] for night-coverage after hours (4p -7a ) and weekends on [email or web address]

## 2016-10-18 NOTE — Progress Notes (Signed)
Subjective:  He says he feels better   Antibiotics:  Anti-infectives    Start     Dose/Rate Route Frequency Ordered Stop   10/17/16 2200  rifampin (RIFADIN) capsule 300 mg     300 mg Oral Every 12 hours 10/17/16 1657     10/15/16 0800  vancomycin (VANCOCIN) IVPB 1000 mg/200 mL premix  Status:  Discontinued     1,000 mg 200 mL/hr over 60 Minutes Intravenous Every 24 hours 10/14/16 0758 10/14/16 2043   10/14/16 2200  ceFAZolin (ANCEF) IVPB 2g/100 mL premix     2 g 200 mL/hr over 30 Minutes Intravenous Every 12 hours 10/14/16 2043     10/14/16 1000  cefTAZidime (FORTAZ) 2 g in dextrose 5 % 50 mL IVPB  Status:  Discontinued     2 g 100 mL/hr over 30 Minutes Intravenous Every 12 hours 10/14/16 0758 10/14/16 2043   10/14/16 0800  vancomycin (VANCOCIN) 1,750 mg in sodium chloride 0.9 % 500 mL IVPB     1,750 mg 250 mL/hr over 120 Minutes Intravenous  Once 10/14/16 0758 10/14/16 1050      Medications: Scheduled Meds: .  ceFAZolin (ANCEF) IV  2 g Intravenous Q12H  . insulin aspart  0-5 Units Subcutaneous QHS  . insulin aspart  0-9 Units Subcutaneous TID WC  . rifampin  300 mg Oral Q12H  . sodium chloride flush  10-40 mL Intracatheter Q12H  . sodium chloride flush  3 mL Intravenous Q12H   Continuous Infusions: . sodium chloride 10 mL/hr at 10/18/16 1100  . amiodarone 30 mg/hr (10/18/16 1100)  . DOBUTamine 7 mcg/kg/min (10/18/16 1100)  . heparin 1,550 Units/hr (10/18/16 1100)  . norepinephrine (LEVOPHED) Adult infusion 35 mcg/min (10/18/16 1145)  . dialysis replacement fluid (prismasate) 300 mL/hr at 10/18/16 1012  . dialysate (PRISMASATE) 2,200 mL/hr at 10/18/16 1120  . sodium chloride 0.9 % 3,000 mL with potassium chloride 12 mEq infusion 200 mL/hr at 10/18/16 1100   PRN Meds:.sodium chloride, acetaminophen, heparin, magnesium hydroxide, ondansetron (ZOFRAN) IV, sodium chloride, sodium chloride flush, sodium chloride flush, traMADol    Objective: Weight change: -3  lb 12 oz (-1.7 kg)  Intake/Output Summary (Last 24 hours) at 10/18/16 1204 Last data filed at 10/18/16 1100  Gross per 24 hour  Intake           6518.4 ml  Output             7350 ml  Net           -831.6 ml   Blood pressure (!) 105/50, pulse 70, temperature 97.2 F (36.2 C), resp. rate (!) 21, height 5\' 7"  (1.702 m), weight 161 lb 6 oz (73.2 kg), SpO2 99 %. Temp:  [96.6 F (35.9 C)-97.9 F (36.6 C)] 97.2 F (36.2 C) (11/23 1100) Pulse Rate:  [69-73] 70 (11/23 1100) Resp:  [16-27] 21 (11/23 1100) BP: (94-106)/(45-50) 105/50 (11/23 1100) SpO2:  [99 %-100 %] 99 % (11/23 1100) Arterial Line BP: (86-126)/(40-90) 105/50 (11/23 1100) Weight:  [161 lb 6 oz (73.2 kg)] 161 lb 6 oz (73.2 kg) (11/23 5409)  Physical Exam: General: Alert and awake, oriented x3, not in any acute distress. HEENT: anicteric sclera, pupils reactive to light and accommodation, EOMI CVS regular rate, normal r,  no murmur rubs or gallops Chest: Fairly clear clear to auscultation bilaterally, no wheezing, Abdomen: soft nontender, nondistended, normal bowel sounds, Extremities:edema Skin PM Noted not overtly infected Multiple central lines in place Neuro:  nonfocal  CBC:  CBC Latest Ref Rng & Units 10/18/2016 10/17/2016 10/16/2016  WBC 4.0 - 10.5 K/uL 8.3 8.1 11.2(H)  Hemoglobin 13.0 - 17.0 g/dL 81.1 12.7(L) 13.8  Hematocrit 39.0 - 52.0 % 37.8(L) 35.7(L) 38.7(L)  Platelets 150 - 400 K/uL 110(L) 96(L) 95(L)     BMET  Recent Labs  10/17/16 1626 10/18/16 0345  NA 128* 126*  K 5.0 4.7  CL 99* 98*  CO2 18* 16*  GLUCOSE 150* 169*  BUN 16 20  CREATININE 1.92* 2.28*  CALCIUM 8.3* 8.5*     Liver Panel   Recent Labs  10/17/16 1626 10/18/16 0345  ALBUMIN 2.8* 3.0*       Sedimentation Rate No results for input(s): ESRSEDRATE in the last 72 hours. C-Reactive Protein No results for input(s): CRP in the last 72 hours.  Micro Results: Recent Results (from the past 720 hour(s))  MRSA PCR  Screening     Status: None   Collection Time: 10/04/2016  4:03 PM  Result Value Ref Range Status   MRSA by PCR NEGATIVE NEGATIVE Final    Comment:        The GeneXpert MRSA Assay (FDA approved for NASAL specimens only), is one component of a comprehensive MRSA colonization surveillance program. It is not intended to diagnose MRSA infection nor to guide or monitor treatment for MRSA infections.   Culture, blood (Routine X 2) w Reflex to ID Panel     Status: Abnormal   Collection Time: 10/14/16  4:50 AM  Result Value Ref Range Status   Specimen Description BLOOD RIGHT HAND  Final   Special Requests IN PEDIATRIC BOTTLE 3CC  Final   Culture  Setup Time   Final    GRAM POSITIVE COCCI IN CLUSTERS IN PEDIATRIC BOTTLE CRITICAL RESULT CALLED TO, READ BACK BY AND VERIFIED WITH: M TURNER,PHARMD AT 1953 10/14/16 BY L BENFIELD    Culture STAPHYLOCOCCUS AUREUS (A)  Final   Report Status 10/16/2016 FINAL  Final   Organism ID, Bacteria STAPHYLOCOCCUS AUREUS  Final      Susceptibility   Staphylococcus aureus - MIC*    CIPROFLOXACIN <=0.5 SENSITIVE Sensitive     ERYTHROMYCIN <=0.25 SENSITIVE Sensitive     GENTAMICIN <=0.5 SENSITIVE Sensitive     OXACILLIN <=0.25 SENSITIVE Sensitive     TETRACYCLINE <=1 SENSITIVE Sensitive     VANCOMYCIN 1 SENSITIVE Sensitive     TRIMETH/SULFA <=10 SENSITIVE Sensitive     CLINDAMYCIN <=0.25 SENSITIVE Sensitive     RIFAMPIN <=0.5 SENSITIVE Sensitive     Inducible Clindamycin NEGATIVE Sensitive     * STAPHYLOCOCCUS AUREUS  Blood Culture ID Panel (Reflexed)     Status: Abnormal   Collection Time: 10/14/16  4:50 AM  Result Value Ref Range Status   Enterococcus species NOT DETECTED NOT DETECTED Final   Listeria monocytogenes NOT DETECTED NOT DETECTED Final   Staphylococcus species DETECTED (A) NOT DETECTED Final    Comment: CRITICAL RESULT CALLED TO, READ BACK BY AND VERIFIED WITH: M TURNER,PHARMD AT 1953 10/14/16 BY L BENFIELD    Staphylococcus aureus  DETECTED (A) NOT DETECTED Final    Comment: CRITICAL RESULT CALLED TO, READ BACK BY AND VERIFIED WITH: M TURNER,PHARMD AT 1953 10/14/16 BY L BENFIELD    Methicillin resistance NOT DETECTED NOT DETECTED Final   Streptococcus species NOT DETECTED NOT DETECTED Final   Streptococcus agalactiae NOT DETECTED NOT DETECTED Final   Streptococcus pneumoniae NOT DETECTED NOT DETECTED Final   Streptococcus pyogenes NOT DETECTED NOT DETECTED  Final   Acinetobacter baumannii NOT DETECTED NOT DETECTED Final   Enterobacteriaceae species NOT DETECTED NOT DETECTED Final   Enterobacter cloacae complex NOT DETECTED NOT DETECTED Final   Escherichia coli NOT DETECTED NOT DETECTED Final   Klebsiella oxytoca NOT DETECTED NOT DETECTED Final   Klebsiella pneumoniae NOT DETECTED NOT DETECTED Final   Proteus species NOT DETECTED NOT DETECTED Final   Serratia marcescens NOT DETECTED NOT DETECTED Final   Haemophilus influenzae NOT DETECTED NOT DETECTED Final   Neisseria meningitidis NOT DETECTED NOT DETECTED Final   Pseudomonas aeruginosa NOT DETECTED NOT DETECTED Final   Candida albicans NOT DETECTED NOT DETECTED Final   Candida glabrata NOT DETECTED NOT DETECTED Final   Candida krusei NOT DETECTED NOT DETECTED Final   Candida parapsilosis NOT DETECTED NOT DETECTED Final   Candida tropicalis NOT DETECTED NOT DETECTED Final  Culture, blood (Routine X 2) w Reflex to ID Panel     Status: Abnormal   Collection Time: 10/14/16  4:58 AM  Result Value Ref Range Status   Specimen Description BLOOD LEFT HAND  Final   Special Requests IN PEDIATRIC BOTTLE 2CC  Final   Culture  Setup Time   Final    GRAM POSITIVE COCCI IN CLUSTERS IN PEDIATRIC BOTTLE TURNER,PHARMD AT 1953 10/14/16 BY L BENFIELD CRITICAL RESULT CALLED TO, READ BACK BY AND VERIFIED WITH:    Culture (A)  Final    STAPHYLOCOCCUS AUREUS SUSCEPTIBILITIES PERFORMED ON PREVIOUS CULTURE WITHIN THE LAST 5 DAYS.    Report Status 10/16/2016 FINAL  Final  Culture,  blood (routine x 2)     Status: None (Preliminary result)   Collection Time: 10/16/16  1:51 AM  Result Value Ref Range Status   Specimen Description BLOOD RIGHT ANTECUBITAL  Final   Special Requests BOTTLES DRAWN AEROBIC AND ANAEROBIC 10CC  Final   Culture NO GROWTH 1 DAY  Final   Report Status PENDING  Incomplete  Culture, blood (routine x 2)     Status: None (Preliminary result)   Collection Time: 10/16/16  1:56 AM  Result Value Ref Range Status   Specimen Description BLOOD LEFT ANTECUBITAL  Final   Special Requests BOTTLES DRAWN AEROBIC AND ANAEROBIC 10CC  Final   Culture NO GROWTH 1 DAY  Final   Report Status PENDING  Incomplete    Studies/Results: No results found.    Assessment/Plan:  INTERVAL HISTORY: Oral rifampin has been added   Active Problems:   Ventricular fibrillation (HCC)   Cardiogenic shock (HCC)   AKI (acute kidney injury) (HCC)   Septic shock (HCC)   Staphylococcus aureus bacteremia   Atrial tachycardia (HCC)    Parker Johnson is a 50 y.o. male wwith NICM admitted for severe cardiogenic shock requiring vasopressors, inotropes, IABP, developing AKI, now on dialysiswho has had multiple lines placed out of necessity including PICC, trialysis line, swann-ganz, arterial lineand IABP to treat cardiogenic shock now complicated with MSSA bacteremia  #1 MSSA bacteremia with device in place and multiple lines which are all by definition infected.  His prognosis unfortunately appears poor despite his good spirits and him feeling better today.  Ultimately forgot to try to cure him he would need removal of all of his current central lines a catheter holiday and removal of the device.  I don't know if any of this is realistic.  In the interim we will continue him on cefazolin along with adjunctive rifampin and I will increase the dose and rifampin to 3 times daily  LOS: 8 days   Acey Lav 10/18/2016, 12:04 PM

## 2016-10-18 NOTE — Progress Notes (Signed)
[facility] Brief Prog Note  Isotonic sodium bicarbonate at 75 cc/hour added for persistent metabolic acidosis Since this is slightly hypertonic w/respect to plasma, may help w/his hyponatremia and enable d/c of NS post filter fluid.  Camille Bal, MD [facility] [phone] Pager 10/18/2016, 3:41 PM

## 2016-10-18 NOTE — Progress Notes (Signed)
Assessment/ Plan:   Pt is a 47M with a PMH sig for NICM with EF 10% (04/2016), ICD, h/o LV thrombus, HTN, and atrial fibrillation who has acute oliguric kidney injury in the setting of florid cardiogenic shock.   1. Cardiogenic shock 2/2 acute systolic heart failure exac: pressors, EF 10%. Swan, IABP  2. AKI on CKD/ vol overload: baseline cr 1.2-1.4. Oliguric .Tolerating UF well at100-200/ hr, continue CRRT. w/some saline due to hyponatremia 3. Vtach: on amio.  4. Staph aureus bacteremia 11/19 Dispo: Cont CRRT w/4k baths, reduce hyponatremic load, ?candidate for cardiac transplant  Subjective: Interval History: neg balance, more urine  Objective: Vital signs in last 24 hours: Temp:  [96.6 F (35.9 C)-97.9 F (36.6 C)] 97.3 F (36.3 C) (11/23 1225) Pulse Rate:  [69-73] 71 (11/23 1225) Resp:  [16-27] 20 (11/23 1225) BP: (94-106)/(45-53) 106/53 (11/23 1200) SpO2:  [99 %-100 %] 100 % (11/23 1225) Arterial Line BP: (86-126)/(40-90) 104/53 (11/23 1225) Weight:  [73.2 kg (161 lb 6 oz)] 73.2 kg (161 lb 6 oz) (11/23 0337) Weight change: -1.7 kg (-3 lb 12 oz)  Intake/Output from previous day: 11/22 0701 - 11/23 0700 In: 8333.5 [P.O.:1280; I.V.:6803.5; IV Piggyback:250] Out: 7969 [Urine:300] Intake/Output this shift: Total I/O In: 575 [P.O.:60; I.V.:415; IV Piggyback:100] Out: 1570 [Other:1570]  General appearance: alert and cooperative Resp: clear to auscultation bilaterally Extremities: extremities normal, atraumatic, no cyanosis or edema  abd soft Neuro ok  Lab Results:  Recent Labs  10/17/16 0330 10/18/16 0345  WBC 8.1 8.3  HGB 12.7* 13.7  HCT 35.7* 37.8*  PLT 96* 110*   BMET:  Recent Labs  10/17/16 1626 10/18/16 0345  NA 128* 126*  K 5.0 4.7  CL 99* 98*  CO2 18* 16*  GLUCOSE 150* 169*  BUN 16 20  CREATININE 1.92* 2.28*  CALCIUM 8.3* 8.5*   No results for input(s): PTH in the last 72 hours. Iron Studies: No results for input(s): IRON, TIBC,  TRANSFERRIN, FERRITIN in the last 72 hours. Studies/Results: No results found.  Scheduled: .  ceFAZolin (ANCEF) IV  2 g Intravenous Q12H  . insulin aspart  0-5 Units Subcutaneous QHS  . insulin aspart  0-9 Units Subcutaneous TID WC  . rifampin  300 mg Oral Q8H  . sodium chloride flush  10-40 mL Intracatheter Q12H  . sodium chloride flush  3 mL Intravenous Q12H      LOS: 8 days   Parker Johnson C 10/18/2016,12:38 PM

## 2016-10-18 NOTE — Progress Notes (Signed)
ANTICOAGULATION CONSULT NOTE   Pharmacy Consult for heparin Indication: IABP/PAF  No Known Allergies  Patient Measurements: Height: 5\' 7"  (170.2 cm) Weight: 161 lb 6 oz (73.2 kg) IBW/kg (Calculated) : 66.1 Heparin Dosing Weight: 80.7 kg  Vital Signs: Temp: 97.3 F (36.3 C) (11/23 0800) Temp Source: Core (Comment) (11/23 0400) BP: 94/46 (11/23 0800) Pulse Rate: 72 (11/23 0800)  Labs:  Recent Labs  10/16/16 0400  10/16/16 2015 10/17/16 0330 10/17/16 1626 10/18/16 0345  HGB 13.8  --   --  12.7*  --  13.7  HCT 38.7*  --   --  35.7*  --  37.8*  PLT 95*  --   --  96*  --  110*  LABPROT 24.2*  --   --  26.7*  --  23.3*  INR 2.13  --   --  2.41  --  2.03  HEPARINUNFRC 0.14*  < > 0.26* 0.23*  --  0.41  CREATININE 1.94*  < >  --  1.84* 1.92* 2.28*  < > = values in this interval not displayed.  Estimated Creatinine Clearance: 36.2 mL/min (by C-G formula based on SCr of 2.28 mg/dL (H)).   Medical History: Past Medical History:  Diagnosis Date  . AICD (automatic cardioverter/defibrillator) present    [employer] 100  . Alcohol abuse    hx  . Cardiomyopathy    secondary  . CHF (congestive heart failure) (HCC)   . Chronic anticoagulation    Coumadin for hx LV thrombus and PAF  . Gout   . HTN (hypertension)    uncontrolled  . Tobacco abuse   . Ventricular tachycardia [facility])     Assessment: 50 yo M on warfarin PTA now held (last dose 11/15) and started on low-dose heparin drip per Dr. Gala Romney s/p IABP placement on 11/17 for cardiogenic shock/Afib. INR on admit was 6.86 in the setting of shock liver and pt was given vitamin k 5mg  IV and FFP prior to cath lab. MD initially requested very low infusion rate of heparin at 500 units/hr given elevated INR and high likelihood of further procedures, now titrating heparin to goal of 0.2-0.5. Heparin currently therapeutic at 0.4 heparin drip rate 1550 uts/hr, CBC low but stable, no S/Sx bleeding noted.  Goal of  Therapy:  Heparin level 0.2-0.5 units/ml Monitor platelets by anticoagulation protocol: Yes   Plan:  -Continue heparin at 1550 units/hr -Monitor heparin level, CBC, S/Sx bleeding daily  Leota Sauers Pharm.D. CPP, BCPS Clinical Pharmacist [phone] 10/18/2016 8:46 AM

## 2016-10-19 DIAGNOSIS — R7881 Bacteremia: Secondary | ICD-10-CM

## 2016-10-19 DIAGNOSIS — T827XXD Infection and inflammatory reaction due to other cardiac and vascular devices, implants and grafts, subsequent encounter: Secondary | ICD-10-CM

## 2016-10-19 LAB — COOXEMETRY PANEL
CARBOXYHEMOGLOBIN: 1.2 % (ref 0.5–1.5)
Carboxyhemoglobin: 0.8 % (ref 0.5–1.5)
Carboxyhemoglobin: 0.9 % (ref 0.5–1.5)
METHEMOGLOBIN: 0.9 % (ref 0.0–1.5)
Methemoglobin: 0.9 % (ref 0.0–1.5)
Methemoglobin: 0.9 % (ref 0.0–1.5)
O2 SAT: 44.8 %
O2 Saturation: 45.4 %
O2 Saturation: 48.1 %
TOTAL HEMOGLOBIN: 13.4 g/dL (ref 12.0–16.0)
TOTAL HEMOGLOBIN: 13.5 g/dL (ref 12.0–16.0)
Total hemoglobin: 13.5 g/dL (ref 12.0–16.0)

## 2016-10-19 LAB — CBC
HEMATOCRIT: 36.9 % — AB (ref 39.0–52.0)
HEMOGLOBIN: 13.6 g/dL (ref 13.0–17.0)
MCH: 28.8 pg (ref 26.0–34.0)
MCHC: 36.9 g/dL — AB (ref 30.0–36.0)
MCV: 78.2 fL (ref 78.0–100.0)
Platelets: 100 10*3/uL — ABNORMAL LOW (ref 150–400)
RBC: 4.72 MIL/uL (ref 4.22–5.81)
RDW: 20.1 % — ABNORMAL HIGH (ref 11.5–15.5)
WBC: 10.9 10*3/uL — ABNORMAL HIGH (ref 4.0–10.5)

## 2016-10-19 LAB — GLUCOSE, CAPILLARY
GLUCOSE-CAPILLARY: 133 mg/dL — AB (ref 65–99)
Glucose-Capillary: 174 mg/dL — ABNORMAL HIGH (ref 65–99)
Glucose-Capillary: 122 mg/dL — ABNORMAL HIGH (ref 65–99)

## 2016-10-19 LAB — RENAL FUNCTION PANEL
ANION GAP: 11 (ref 5–15)
Albumin: 2.9 g/dL — ABNORMAL LOW (ref 3.5–5.0)
Albumin: 2.7 g/dL — ABNORMAL LOW (ref 3.5–5.0)
Anion gap: 13 (ref 5–15)
BUN: 22 mg/dL — AB (ref 6–20)
BUN: 18 mg/dL (ref 6–20)
CALCIUM: 8.2 mg/dL — AB (ref 8.9–10.3)
CHLORIDE: 96 mmol/L — AB (ref 101–111)
CHLORIDE: 96 mmol/L — AB (ref 101–111)
CO2: 18 mmol/L — AB (ref 22–32)
CO2: 21 mmol/L — AB (ref 22–32)
CREATININE: 2.3 mg/dL — AB (ref 0.61–1.24)
Calcium: 8.1 mg/dL — ABNORMAL LOW (ref 8.9–10.3)
Creatinine, Ser: 1.94 mg/dL — ABNORMAL HIGH (ref 0.61–1.24)
GFR calc Af Amer: 36 mL/min — ABNORMAL LOW (ref >60)
GFR calc Af Amer: 45 mL/min — ABNORMAL LOW (ref >60)
GFR calc non Af Amer: 39 mL/min — ABNORMAL LOW (ref >60)
GFR, EST NON AFRICAN AMERICAN: 31 mL/min — AB (ref >60)
GLUCOSE: 139 mg/dL — AB (ref 65–99)
Glucose, Bld: 174 mg/dL — ABNORMAL HIGH (ref 65–99)
PHOSPHORUS: 2.2 mg/dL — AB (ref 2.5–4.6)
POTASSIUM: 4.1 mmol/L (ref 3.5–5.1)
Phosphorus: 2.7 mg/dL (ref 2.5–4.6)
Potassium: 4.2 mmol/L (ref 3.5–5.1)
SODIUM: 127 mmol/L — AB (ref 135–145)
Sodium: 128 mmol/L — ABNORMAL LOW (ref 135–145)

## 2016-10-19 LAB — MAGNESIUM: Magnesium: 2.3 mg/dL (ref 1.7–2.4)

## 2016-10-19 LAB — HEPARIN LEVEL (UNFRACTIONATED): Heparin Unfractionated: 0.4 IU/mL (ref 0.30–0.70)

## 2016-10-19 LAB — PROTIME-INR
INR: 2.1
Prothrombin Time: 23.9 seconds — ABNORMAL HIGH (ref 11.4–15.2)

## 2016-10-19 LAB — POCT ACTIVATED CLOTTING TIME: ACTIVATED CLOTTING TIME: 147 s

## 2016-10-19 MED ORDER — HEPARIN BOLUS VIA INFUSION (CRRT)
1000.0000 [IU] | INTRAVENOUS | Status: DC | PRN
  Administered 2016-10-20: 1000 [IU] via INTRAVENOUS_CENTRAL
  Filled 2016-10-19: qty 1000

## 2016-10-19 MED ORDER — SODIUM CHLORIDE 0.9 % IJ SOLN
250.0000 [IU]/h | INTRAMUSCULAR | Status: DC
  Administered 2016-10-19: 250 [IU]/h via INTRAVENOUS_CENTRAL
  Administered 2016-10-20: 1100 [IU]/h via INTRAVENOUS_CENTRAL
  Filled 2016-10-19 (×2): qty 2

## 2016-10-19 MED ORDER — SENNOSIDES-DOCUSATE SODIUM 8.6-50 MG PO TABS
1.0000 | ORAL_TABLET | Freq: Every day | ORAL | Status: DC
  Administered 2016-10-19 – 2016-10-26 (×4): 1 via ORAL
  Filled 2016-10-19 (×6): qty 1

## 2016-10-19 MED ORDER — AMIODARONE HCL IN DEXTROSE 360-4.14 MG/200ML-% IV SOLN
30.0000 mg/h | INTRAVENOUS | Status: AC
  Administered 2016-10-19 – 2016-10-20 (×2): 30 mg/h via INTRAVENOUS
  Filled 2016-10-19: qty 200

## 2016-10-19 NOTE — Progress Notes (Signed)
Patient ID: Parker Johnson, male   DOB: [birth year], 50 y.o.   MRN: [id number]    Advanced Heart Failure Rounding Note   Subjective:    Admitted with VT/syncope and profound cardiogenic shock. PICC placed with initial CO-OX 26%.   Underwent IABP and Trialysis cath placement on 11/17  Now on dobutamine 8 and norepi 40. IV amio.   On 11/19 developed sepsis. Bcx + for MSSA. Now on Ancef.   Continues to feel good this morning. Denies SOB. Appetite good. No more urine output. Remains on IABP 1:1 and CVVHD pulling about 150/hr.   Co-ox 35% -> 45%  INR 2.10. Remains on heparin  F/u bcx are negative.  Swan this morning (done personally) RA 27 PCWP 19 CI 1.8   Objective:   Weight Range:  Vital Signs:   Temp:  [96.8 F (36 C)-97.7 F (36.5 C)] 97.2 F (36.2 C) (11/24 0900) Pulse Rate:  [68-77] 73 (11/24 0900) Resp:  [13-26] 23 (11/24 0900) BP: (102-127)/(43-57) 127/57 (11/23 1900) SpO2:  [99 %-100 %] 99 % (11/24 0900) Arterial Line BP: (102-143)/(43-62) 121/54 (11/24 0900) Weight:  [71.9 kg (158 lb 8.2 oz)] 71.9 kg (158 lb 8.2 oz) (11/24 0400) Last BM Date: 10/19/16  Weight change: Filed Weights   10/17/16 0300 10/18/16 0337 10/19/16 0400  Weight: 74.9 kg (165 lb 2 oz) 73.2 kg (161 lb 6 oz) 71.9 kg (158 lb 8.2 oz)    Intake/Output:   Intake/Output Summary (Last 24 hours) at 10/19/16 0932 Last data filed at 10/19/16 0900  Gross per 24 hour  Intake           6680.5 ml  Output             6166 ml  Net            514.5 ml     Physical Exam:  General:  Lying in bed. Critically ill and fatigued appearing.  HEENT: Normal Neck: supple. JVP remains elevated to ear. RIJ swan . Carotids 2+ bilat; no bruits. No thyromegaly or nodule noted.  Cor: PMI nondisplaced. RRR. No murmurs or rubs. +S3 Lungs:clear   Abdomen: soft, NT, ND, no HSM. No bruits or masses. +BS  Extremities: no cyanosis, clubbing, rash  RUE PICC. No edema. Ted hose present. R femoral trialysis cath and  IABP.  Neuro: awake. Follows commands cranial nerves grossly intact. moves all 4 extremities w/o difficulty.   Telemetry: Reviewed, NSR with occasional PVCs  Labs: Basic Metabolic Panel:  Recent Labs Lab 10/15/16 0537  10/16/16 0400  10/17/16 0330 10/17/16 1626 10/18/16 0345 10/18/16 1621 10/19/16 0415  NA 129*  < > 129*  < > 127* 128* 126* 126* 127*  K 4.2  < > 4.2  < > 4.4 5.0 4.7 4.6 4.2  CL 94*  < > 95*  < > 97* 99* 98* 96* 96*  CO2 23  < > 23  < > 17* 18* 16* 22 18*  GLUCOSE 145*  < > 139*  < > 126* 150* 169* 114* 174*  BUN 20  < > 18  < > 15 16 20  23* 22*  CREATININE 2.23*  < > 1.94*  < > 1.84* 1.92* 2.28* 2.41* 2.30*  CALCIUM 8.0*  < > 8.3*  < > 8.3* 8.3* 8.5* 8.3* 8.2*  MG 2.5*  --  2.5*  --  1.8  --  2.5*  --  2.3  PHOS 3.1  < > 2.4*  < > 2.4* 2.8 2.8 3.0  2.7  < > = values in this interval not displayed.  Liver Function Tests:  Recent Labs Lab 10/17/16 0330 10/17/16 1626 10/18/16 0345 10/18/16 1621 10/19/16 0415  ALBUMIN 3.0* 2.8* 3.0* 2.9* 2.9*   No results for input(s): LIPASE, AMYLASE in the last 168 hours. No results for input(s): AMMONIA in the last 168 hours.  CBC:  Recent Labs Lab 10/15/16 0537 10/16/16 0400 10/17/16 0330 10/18/16 0345 10/19/16 0415  WBC 16.4* 11.2* 8.1 8.3 10.9*  HGB 14.1 13.8 12.7* 13.7 13.6  HCT 40.7 38.7* 35.7* 37.8* 36.9*  MCV 83.2 82.2 80.4 79.4 78.2  PLT 102* 95* 96* 110* 100*    Cardiac Enzymes: No results for input(s): CKTOTAL, CKMB, CKMBINDEX, TROPONINI in the last 168 hours.  BNP: BNP (last 3 results)  Recent Labs  05/16/16 0420 06/21/16 1218 10/19/2016 0957  BNP 2,453.6* 2,504.3* 3,542.3*    ProBNP (last 3 results) No results for input(s): PROBNP in the last 8760 hours.    Other results:  Imaging: No results found.   Medications:     Scheduled Medications: .  ceFAZolin (ANCEF) IV  2 g Intravenous Q12H  . insulin aspart  0-5 Units Subcutaneous QHS  . insulin aspart  0-9 Units  Subcutaneous TID WC  . rifampin  300 mg Oral Q8H  . sodium chloride flush  10-40 mL Intracatheter Q12H  . sodium chloride flush  3 mL Intravenous Q12H    Infusions: . sodium chloride 10 mL/hr at 10/18/16 1900  . sodium chloride 200 mL/hr at 10/19/16 0821  . amiodarone 30 mg/hr (10/19/16 0319)  . dextrose 5 % 1,000 mL with sodium bicarbonate 150 mEq infusion 75 mL/hr at 10/19/16 0557  . DOBUTamine 8 mcg/kg/min (10/19/16 0557)  . heparin 1,550 Units/hr (10/19/16 0230)  . norepinephrine (LEVOPHED) Adult infusion 40 mcg/min (10/19/16 0800)  . dialysis replacement fluid (prismasate) 200 mL/hr at 10/18/16 1322  . dialysate (PRISMASATE) 1,500 mL/hr at 10/19/16 0729    PRN Medications: sodium chloride, acetaminophen, bisacodyl, heparin, magnesium hydroxide, ondansetron (ZOFRAN) IV, sodium chloride, sodium chloride flush, sodium chloride flush, traMADol   Assessment/Plan/Discussion    1. Syncope: VT [employer].  Device interrogation showed multiple episodes of VT. Loading amio per EP. K repleted. No further VT over the last few days. EP following 2. A/C Systolic Biventricular Heart Failure (end-stage) -> Cardiogenic Shock.  NICM. ECHO this admission EF 10% with severe RV dilation and dysfunction. BNP >3000. On admit he had marked volume overload with prominent S3.  - He is down 22 pounds with CVVHD. PCWP now good but RAP remains very high suggestive of end-stage RV failure - Coox markedly depressed. Increase dobutamine at 10mcg.  Wean norepi as tolerated - Given RV>>LV failure will try to wean IABP today  - Continue CVVH, pulling fluid as BP tolerates.  - Not candidate for VAD or transplant so have not pursued ECMO.  (Would need heart kidney. Had ongoing tobacco use on admit and last cocaine use about 3 months ago. Blood type B+) 3. PAF:  Maintaining NSR on amio.  On heparin with IABP.  4. H/O LV thrombus 5. Acute on CKD III:  Anuric. Now on CVVHD (started 11/17).  - Volume status  about as good as it will get. Making a little urine today. Continue to support. D/W Dr. Lowell GuitarPowell. 6. H/O ETOH  7. H/O tobacco abuse 8. Hyperkalemia: CVVH.  9. MSSA sepsis  - Now on Ancef   He feels better but cardiac indices remain poor despite max  support and 22 pound fluid removal. Main issue currently is end-stage RV failure>>LV failure. I do not think there is a way out for him as he is not transplant candidate.  Will try to wean IABP today and try to support with inotropes. If fails, will need comfort care/  Rhythm more stable on amio. Continue ancef for MSSA bacteremia.   The patient is critically ill with multiple organ systems failure and requires high complexity decision making for assessment and support, frequent evaluation and titration of therapies, application of advanced monitoring technologies and extensive interpretation of multiple databases.   Critical Care Time devoted to patient care services described in this note is 35 Minutes.  Length of Stay: 9  Arvilla Meres, MD 10/19/2016, 9:32 AM  Advanced Heart Failure Team Pager [phone] (M-F; 7a - 4p)  Please contact [facility] for night-coverage after hours (4p -7a ) and weekends on [email or web address]

## 2016-10-19 NOTE — Progress Notes (Signed)
Assessment/ Plan:   Pt is a 37M with a PMH sig for NICM with EF 10% (04/2016), ICD, h/o LV thrombus, HTN, and atrial fibrillation who has acute oliguric kidney injury in the setting of cardiogenic shock.   1. Cardiogenic shock 2/2 acute systolic heart failure exac: pressors, EF 10%. Swan, IABP  2. Oliguric AKI on CKD/ vol overload: baseline cr 1.2-1.4. Oliguric .Tolerating UF well at100-200/ hr, continue CRRT. w/some saline due to hyponatremia 3. Vtach: on amio.  4. Staph aureus bacteremia 11/19 Dispo: Cont CRRT w/4k baths, reduce hyponatremic load  Subjective: Interval History: "I'm Thankful"  Objective: Vital signs in last 24 hours: Temp:  [96.8 F (36 C)-97.7 F (36.5 C)] 97.3 F (36.3 C) (11/24 1600) Pulse Rate:  [35-77] 77 (11/24 1600) Resp:  [13-26] 21 (11/24 1600) BP: (110-127)/(48-57) 127/57 (11/23 1900) SpO2:  [96 %-100 %] 96 % (11/24 1600) Arterial Line BP: (106-143)/(45-62) 110/56 (11/24 1600) Weight:  [71.9 kg (158 lb 8.2 oz)] 71.9 kg (158 lb 8.2 oz) (11/24 0400) Weight change: -1.3 kg (-2 lb 13.9 oz)  Intake/Output from previous day: 11/23 0701 - 11/24 0700 In: 6054 [P.O.:375; I.V.:5479; IV Piggyback:200] Out: 6244  Intake/Output this shift: Total I/O In: 3651.2 [P.O.:360; I.V.:3191.2; IV Piggyback:100] Out: 2984 [Urine:125; Other:2859]  General appearance: alert and cooperative  Lungs clear Ext tr edema  Lab Results:  Recent Labs  10/18/16 0345 10/19/16 0415  WBC 8.3 10.9*  HGB 13.7 13.6  HCT 37.8* 36.9*  PLT 110* 100*   BMET:  Recent Labs  10/18/16 1621 10/19/16 0415  NA 126* 127*  K 4.6 4.2  CL 96* 96*  CO2 22 18*  GLUCOSE 114* 174*  BUN 23* 22*  CREATININE 2.41* 2.30*  CALCIUM 8.3* 8.2*   No results for input(s): PTH in the last 72 hours. Iron Studies: No results for input(s): IRON, TIBC, TRANSFERRIN, FERRITIN in the last 72 hours. Studies/Results: No results found.  Scheduled: .  ceFAZolin (ANCEF) IV  2 g Intravenous  Q12H  . insulin aspart  0-5 Units Subcutaneous QHS  . insulin aspart  0-9 Units Subcutaneous TID WC  . rifampin  300 mg Oral Q8H  . senna-docusate  1 tablet Oral Daily  . sodium chloride flush  10-40 mL Intracatheter Q12H  . sodium chloride flush  3 mL Intravenous Q12H    LOS: 9 days   Alexandra Posadas C 10/19/2016,4:18 PM

## 2016-10-19 NOTE — Progress Notes (Signed)
ANTICOAGULATION CONSULT NOTE   Pharmacy Consult for heparin Indication: IABP/PAF  No Known Allergies  Patient Measurements: Height: 5\' 7"  (170.2 cm) Weight: 158 lb 8.2 oz (71.9 kg) IBW/kg (Calculated) : 66.1 Heparin Dosing Weight: 80.7 kg  Vital Signs: Temp: 97.3 F (36.3 C) (11/24 0600) Temp Source: Core (Comment) (11/24 0400) BP: 127/57 (11/23 1900) Pulse Rate: 77 (11/24 0600)  Labs:  Recent Labs  10/17/16 0330  10/18/16 0345 10/18/16 1621 10/19/16 0414 10/19/16 0415  HGB 12.7*  --  13.7  --   --  13.6  HCT 35.7*  --  37.8*  --   --  36.9*  PLT 96*  --  110*  --   --  100*  LABPROT 26.7*  --  23.3*  --   --  23.9*  INR 2.41  --  2.03  --   --  2.10  HEPARINUNFRC 0.23*  --  0.41  --  0.40  --   CREATININE 1.84*  < > 2.28* 2.41*  --  2.30*  < > = values in this interval not displayed.  Estimated Creatinine Clearance: 35.9 mL/min (by C-G formula based on SCr of 2.3 mg/dL (H)).   Medical History: Past Medical History:  Diagnosis Date  . AICD (automatic cardioverter/defibrillator) present    [employer] 100  . Alcohol abuse    hx  . Cardiomyopathy    secondary  . CHF (congestive heart failure) (HCC)   . Chronic anticoagulation    Coumadin for hx LV thrombus and PAF  . Gout   . HTN (hypertension)    uncontrolled  . Tobacco abuse   . Ventricular tachycardia [facility])     Assessment: 50 yo M on warfarin PTA now held (last dose 11/15) and started on low-dose heparin drip per Dr. Gala Romney s/p IABP placement on 11/17 for cardiogenic shock/Afib. INR on admit was 6.86 in the setting of shock liver and pt was given vitamin k 5mg  IV and FFP prior to cath lab. MD initially requested very low infusion rate of heparin at 500 units/hr given elevated INR and high likelihood of further procedures, now titrating heparin to goal of 0.2-0.5. Heparin remains therapeutic at 0.40, CBC low but stable, no S/Sx bleeding noted.  Goal of Therapy:  Heparin level 0.2-0.5  units/ml Monitor platelets by anticoagulation protocol: Yes   Plan:  -Continue heparin at 1550 units/hr -Monitor heparin level, CBC, S/Sx bleeding daily  Fredonia Highland, PharmD PGY-1 Pharmacy Resident Pager: [phone] 10/19/2016

## 2016-10-19 NOTE — Progress Notes (Signed)
22f sheath/balloon pump removed intact from right femoral artery. Manual pressure held x25 min per Oliver Hum, RT,RCIS. No bleeding or hematoma noted. Patient tolerated well. 4x4 gauze dressing applied to site. VSS. Call bell near. Nurse informed.

## 2016-10-19 NOTE — Progress Notes (Signed)
SUBJECTIVE: The patient remains in very good spirits, tells me he feels very good, denies any SOB or any CP  .  ceFAZolin (ANCEF) IV  2 g Intravenous Q12H  . insulin aspart  0-5 Units Subcutaneous QHS  . insulin aspart  0-9 Units Subcutaneous TID WC  . rifampin  300 mg Oral Q8H  . sodium chloride flush  10-40 mL Intracatheter Q12H  . sodium chloride flush  3 mL Intravenous Q12H   . sodium chloride 10 mL/hr at 10/18/16 1900  . sodium chloride 200 mL/hr at 10/19/16 0821  . amiodarone 30 mg/hr (10/19/16 0319)  . dextrose 5 % 1,000 mL with sodium bicarbonate 150 mEq infusion 75 mL/hr at 10/19/16 0557  . DOBUTamine 8 mcg/kg/min (10/19/16 0557)  . heparin 1,550 Units/hr (10/19/16 0230)  . norepinephrine (LEVOPHED) Adult infusion 45 mcg/min (10/19/16 0700)  . dialysis replacement fluid (prismasate) 200 mL/hr at 10/18/16 1322  . dialysate (PRISMASATE) 1,500 mL/hr at 10/19/16 0729    OBJECTIVE: Physical Exam: Vitals:   10/19/16 0400 10/19/16 0500 10/19/16 0600 10/19/16 0700  BP:      Pulse: 77 77 77 73  Resp: 16 13 18 20   Temp: 97.3 F (36.3 C) 97.3 F (36.3 C) 97.3 F (36.3 C) 97 F (36.1 C)  TempSrc: Core (Comment)   Core (Comment)  SpO2: 99% 99% 99% 99%  Weight: 158 lb 8.2 oz (71.9 kg)     Height:        Intake/Output Summary (Last 24 hours) at 10/19/16 8832 Last data filed at 10/19/16 0800  Gross per 24 hour  Intake           6279.6 ml  Output             6231 ml  Net             48.6 ml    Telemetry reviewed by myself, looks SR, ATach with block, V rates 70's, rare NSVT longest 5 beats  GEN- The patient is ill appearing, alert and oriented x 3 today.   Head- normocephalic, atraumatic Eyes-  Sclera clear, conjunctiva pink Ears- hearing intact Oropharynx- clear Neck- supple, no JVP Lungs- Clear to ausculation bilaterally, normal work of breathing Heart- Regular rate and rhythm, no significant murmurs, no rubs or gallops GI- soft, NT, ND Extremities- no  clubbing, cyanosis, or edema Skin- no rash or lesion Psych- euthymic mood, full affect Neuro- no gross deficits appreciated  Trialysis catheter and IABP in place  LABS: Basic Metabolic Panel:  Recent Labs  54/98/26 0345 10/18/16 1621 10/19/16 0415  NA 126* 126* 127*  K 4.7 4.6 4.2  CL 98* 96* 96*  CO2 16* 22 18*  GLUCOSE 169* 114* 174*  BUN 20 23* 22*  CREATININE 2.28* 2.41* 2.30*  CALCIUM 8.5* 8.3* 8.2*  MG 2.5*  --  2.3  PHOS 2.8 3.0 2.7   Liver Function Tests:  Recent Labs  10/18/16 1621 10/19/16 0415  ALBUMIN 2.9* 2.9*   No results for input(s): LIPASE, AMYLASE in the last 72 hours. CBC:  Recent Labs  10/18/16 0345 10/19/16 0415  WBC 8.3 10.9*  HGB 13.7 13.6  HCT 37.8* 36.9*  MCV 79.4 78.2  PLT 110* 100*    RADIOLOGY: Dg Chest Port 1 View Result Date: 10/14/2016 CLINICAL DATA:  Fever, chills. EXAM: PORTABLE CHEST 1 VIEW COMPARISON:  10/02/2016 FINDINGS: Left pacer remains in place, unchanged. Right Swan-Ganz catheter is in place with the tip in the right lower lobe pulmonary  artery. There is marked cardiomegaly with vascular congestion. No overt failure. Probable left lower lobe compressive atelectasis. No definite effusion. IMPRESSION: Swan-Ganz catheter in the central right lower lobe pulmonary artery. Marked cardiomegaly and vascular congestion. Electronically Signed   By: Charlett NoseKevin  Dover M.D.   On: 10/14/2016 07:24     ASSESSMENT AND PLAN:   1. Septic/cardiogenic shock w/MSOF     IABP remains 1;1, augmented SBP low 100's     On Dobutamine gtt @7      levophed gtt @40   Hyponatremic 127 Leukocytosis down to 10.9 INR 2.10 H/H 13/36 plts 100  Co-ox 45 CO 3.5  Cumulative fluid negative: -14,76196ml  2.  Bacteremia      + BC with gram + cocci, Staph      On cefazolin and rifampin      Appreciate ID input  ICD in place, BSCi implanted 2010       3. VT storm Rare NSVT  remains on amiodarone gtt @30  rhythm appears to be continued ATach    10/16/16 device reprogrammed to DDI 60 from DDD 40  4. ARF     On CRRT     No urine OP     Continue current management with CHF and ICU team, nephrology    Francis Dowseenee Ursuy, PA-C 10/19/2016 8:52 AM  Patient seen and examined. The effort is going to be to decrease balloon pump support today. With his co-ox at 8034, I am not sanguine about how all this is going to turn out. He remains in amazingly good spirits trusting to his God  Continue amio  Still in atrial tach with 2:1

## 2016-10-19 NOTE — Progress Notes (Signed)
Change IABP to 1:3 per verbal order from MD Bensimhon.

## 2016-10-20 ENCOUNTER — Inpatient Hospital Stay (HOSPITAL_COMMUNITY): Payer: Medicaid Other

## 2016-10-20 DIAGNOSIS — N170 Acute kidney failure with tubular necrosis: Secondary | ICD-10-CM

## 2016-10-20 LAB — RENAL FUNCTION PANEL
ALBUMIN: 2.7 g/dL — AB (ref 3.5–5.0)
ANION GAP: 11 (ref 5–15)
Albumin: 2.8 g/dL — ABNORMAL LOW (ref 3.5–5.0)
Anion gap: 12 (ref 5–15)
BUN: 16 mg/dL (ref 6–20)
BUN: 15 mg/dL (ref 6–20)
CALCIUM: 8.3 mg/dL — AB (ref 8.9–10.3)
CHLORIDE: 95 mmol/L — AB (ref 101–111)
CHLORIDE: 98 mmol/L — AB (ref 101–111)
CO2: 22 mmol/L (ref 22–32)
CO2: 21 mmol/L — AB (ref 22–32)
CREATININE: 1.86 mg/dL — AB (ref 0.61–1.24)
Calcium: 8.2 mg/dL — ABNORMAL LOW (ref 8.9–10.3)
Creatinine, Ser: 1.88 mg/dL — ABNORMAL HIGH (ref 0.61–1.24)
GFR calc non Af Amer: 40 mL/min — ABNORMAL LOW (ref >60)
GFR, EST AFRICAN AMERICAN: 47 mL/min — AB (ref >60)
GFR, EST AFRICAN AMERICAN: 46 mL/min — AB (ref >60)
GFR, EST NON AFRICAN AMERICAN: 41 mL/min — AB (ref >60)
GLUCOSE: 88 mg/dL (ref 65–99)
Glucose, Bld: 132 mg/dL — ABNORMAL HIGH (ref 65–99)
PHOSPHORUS: 2.4 mg/dL — AB (ref 2.5–4.6)
POTASSIUM: 4 mmol/L (ref 3.5–5.1)
Phosphorus: 2.5 mg/dL (ref 2.5–4.6)
Potassium: 3.9 mmol/L (ref 3.5–5.1)
Sodium: 129 mmol/L — ABNORMAL LOW (ref 135–145)
Sodium: 130 mmol/L — ABNORMAL LOW (ref 135–145)

## 2016-10-20 LAB — GLUCOSE, CAPILLARY
GLUCOSE-CAPILLARY: 159 mg/dL — AB (ref 65–99)
GLUCOSE-CAPILLARY: 86 mg/dL (ref 65–99)
GLUCOSE-CAPILLARY: 111 mg/dL — AB (ref 65–99)
Glucose-Capillary: 145 mg/dL — ABNORMAL HIGH (ref 65–99)

## 2016-10-20 LAB — PROTIME-INR
INR: 2.1
PROTHROMBIN TIME: 23.9 s — AB (ref 11.4–15.2)

## 2016-10-20 LAB — CBC
HEMATOCRIT: 35.6 % — AB (ref 39.0–52.0)
HEMOGLOBIN: 13.1 g/dL (ref 13.0–17.0)
MCH: 28.7 pg (ref 26.0–34.0)
MCHC: 36.8 g/dL — AB (ref 30.0–36.0)
MCV: 77.9 fL — ABNORMAL LOW (ref 78.0–100.0)
Platelets: 89 10*3/uL — ABNORMAL LOW (ref 150–400)
RBC: 4.57 MIL/uL (ref 4.22–5.81)
RDW: 20.2 % — AB (ref 11.5–15.5)
WBC: 9.2 10*3/uL (ref 4.0–10.5)

## 2016-10-20 LAB — APTT: APTT: 43 s — AB (ref 24–36)

## 2016-10-20 LAB — POCT ACTIVATED CLOTTING TIME
ACTIVATED CLOTTING TIME: 120 s
ACTIVATED CLOTTING TIME: 153 s
ACTIVATED CLOTTING TIME: 153 s
ACTIVATED CLOTTING TIME: 164 s
ACTIVATED CLOTTING TIME: 169 s
ACTIVATED CLOTTING TIME: 169 s
ACTIVATED CLOTTING TIME: 186 s
ACTIVATED CLOTTING TIME: 186 s
Activated Clotting Time: 186 seconds
Activated Clotting Time: 180 seconds
Activated Clotting Time: 191 seconds

## 2016-10-20 LAB — COOXEMETRY PANEL
CARBOXYHEMOGLOBIN: 1.1 % (ref 0.5–1.5)
Methemoglobin: 1.1 % (ref 0.0–1.5)
O2 Saturation: 59.6 %
Total hemoglobin: 12.1 g/dL (ref 12.0–16.0)

## 2016-10-20 LAB — HEPARIN LEVEL (UNFRACTIONATED): Heparin Unfractionated: 0.18 IU/mL — ABNORMAL LOW (ref 0.30–0.70)

## 2016-10-20 LAB — MAGNESIUM: MAGNESIUM: 2.2 mg/dL (ref 1.7–2.4)

## 2016-10-20 MED ORDER — AMIODARONE HCL 200 MG PO TABS
400.0000 mg | ORAL_TABLET | Freq: Two times a day (BID) | ORAL | Status: DC
  Administered 2016-10-20 – 2016-10-30 (×21): 400 mg via ORAL
  Filled 2016-10-20 (×21): qty 2

## 2016-10-20 MED ORDER — HEPARIN (PORCINE) IN NACL 100-0.45 UNIT/ML-% IJ SOLN
1650.0000 [IU]/h | INTRAMUSCULAR | Status: DC
  Administered 2016-10-20: 1500 [IU]/h via INTRAVENOUS
  Administered 2016-10-21 – 2016-10-23 (×5): 1650 [IU]/h via INTRAVENOUS
  Filled 2016-10-20 (×6): qty 250

## 2016-10-20 NOTE — Progress Notes (Signed)
Patient ID: Parker Johnson, male   DOB: Jul 04, 1966, 50 y.o.   MRN: [id number]    Advanced Heart Failure Rounding Note   Subjective:    Admitted with VT/syncope and profound cardiogenic shock. PICC placed with initial CO-OX 26%.   Underwent IABP and Trialysis cath placement on 11/17  IABP pulled 11/24   Continues to feel good this morning. Denies SOB. Appetite good. Had about 150 cc urine out yesterday.  Now on dobutamine 10 and norepi 20. IV amio switched to po.   On 11/19 developed sepsis. Bcx + for MSSA. Now on Ancef. F/u cultures negative.  Co-ox 35% -> 45% -> 60%  INR unchanged at 2.10. Heparin off for IABP pull. Hgb stable  F/u bcx are negative.  Swan this morning (done personally) RA 27 PCWP 21 CI 2.8   Objective:   Weight Range:  Vital Signs:   Temp:  [96.1 F (35.6 C)-97.9 F (36.6 C)] 96.3 F (35.7 C) (11/25 0926) Pulse Rate:  [35-79] 78 (11/25 0926) Resp:  [16-29] 29 (11/25 0926) SpO2:  [96 %-100 %] 100 % (11/25 0926) Arterial Line BP: (92-116)/(54-67) 99/57 (11/25 0926) Weight:  [68.4 kg (150 lb 12.7 oz)] 68.4 kg (150 lb 12.7 oz) (11/25 0600) Last BM Date: 10/20/16  Weight change: Filed Weights   10/18/16 0337 10/19/16 0400 10/20/16 0600  Weight: 73.2 kg (161 lb 6 oz) 71.9 kg (158 lb 8.2 oz) 68.4 kg (150 lb 12.7 oz)    Intake/Output:   Intake/Output Summary (Last 24 hours) at 10/20/16 0949 Last data filed at 10/20/16 0900  Gross per 24 hour  Intake          8734.04 ml  Output             8973 ml  Net          -238.96 ml     Physical Exam:  General:  Lying in bed. Critically ill and fatigued appearing.  HEENT: Normal Neck: supple. JVP remains elevated to ear. RIJ swan . Carotids 2+ bilat; no bruits. No thyromegaly or nodule noted.  Cor: PMI nondisplaced. RRR. No murmurs or rubs. +S3 Lungs:clear   Abdomen: soft, NT, ND, no HSM. No bruits or masses. +BS  Extremities: no cyanosis, clubbing, rash  RUE PICC. No edema. Ted hose present. R  femoral trialysis cath and IABP.  Neuro: awake. Follows commands cranial nerves grossly intact. moves all 4 extremities w/o difficulty.   Telemetry: Reviewed, NSR with occasional PVCs  Labs: Basic Metabolic Panel:  Recent Labs Lab 10/16/16 0400  10/17/16 0330  10/18/16 0345 10/18/16 1621 10/19/16 0415 10/19/16 1623 10/20/16 0430  NA 129*  < > 127*  < > 126* 126* 127* 128* 129*  K 4.2  < > 4.4  < > 4.7 4.6 4.2 4.1 3.9  CL 95*  < > 97*  < > 98* 96* 96* 96* 95*  CO2 23  < > 17*  < > 16* 22 18* 21* 22  GLUCOSE 139*  < > 126*  < > 169* 114* 174* 139* 132*  BUN 18  < > 15  < > 20 23* 22* 18 16  CREATININE 1.94*  < > 1.84*  < > 2.28* 2.41* 2.30* 1.94* 1.86*  CALCIUM 8.3*  < > 8.3*  < > 8.5* 8.3* 8.2* 8.1* 8.2*  MG 2.5*  --  1.8  --  2.5*  --  2.3  --  2.2  PHOS 2.4*  < > 2.4*  < > 2.8  3.0 2.7 2.2* 2.4*  < > = values in this interval not displayed.  Liver Function Tests:  Recent Labs Lab 10/18/16 0345 10/18/16 1621 10/19/16 0415 10/19/16 1623 10/20/16 0430  ALBUMIN 3.0* 2.9* 2.9* 2.7* 2.7*   No results for input(s): LIPASE, AMYLASE in the last 168 hours. No results for input(s): AMMONIA in the last 168 hours.  CBC:  Recent Labs Lab 10/16/16 0400 10/17/16 0330 10/18/16 0345 10/19/16 0415 10/20/16 0430  WBC 11.2* 8.1 8.3 10.9* 9.2  HGB 13.8 12.7* 13.7 13.6 13.1  HCT 38.7* 35.7* 37.8* 36.9* 35.6*  MCV 82.2 80.4 79.4 78.2 77.9*  PLT 95* 96* 110* 100* 89*    Cardiac Enzymes: No results for input(s): CKTOTAL, CKMB, CKMBINDEX, TROPONINI in the last 168 hours.  BNP: BNP (last 3 results)  Recent Labs  05/16/16 0420 06/21/16 1218 2016/11/08 0957  BNP 2,453.6* 2,504.3* 3,542.3*    ProBNP (last 3 results) No results for input(s): PROBNP in the last 8760 hours.    Other results:  Imaging: No results found.   Medications:     Scheduled Medications: . amiodarone  400 mg Oral BID  .  ceFAZolin (ANCEF) IV  2 g Intravenous Q12H  . insulin aspart  0-5  Units Subcutaneous QHS  . insulin aspart  0-9 Units Subcutaneous TID WC  . rifampin  300 mg Oral Q8H  . senna-docusate  1 tablet Oral Daily  . sodium chloride flush  10-40 mL Intracatheter Q12H  . sodium chloride flush  3 mL Intravenous Q12H    Infusions: . sodium chloride 10 mL/hr at 10/18/16 1900  . sodium chloride 200 mL/hr at 10/20/16 0211  . dextrose 5 % 1,000 mL with sodium bicarbonate 150 mEq infusion 75 mL/hr at 10/19/16 2012  . DOBUTamine 10 mcg/kg/min (10/20/16 0208)  . heparin 10,000 units/ 20 mL infusion syringe 1,100 Units/hr (10/20/16 0935)  . norepinephrine (LEVOPHED) Adult infusion 20 mcg/min (10/20/16 0313)  . dialysis replacement fluid (prismasate) 200 mL/hr at 10/19/16 1030  . dialysate (PRISMASATE) 1,500 mL/hr at 10/20/16 0432    PRN Medications: sodium chloride, acetaminophen, bisacodyl, heparin, heparin, magnesium hydroxide, ondansetron (ZOFRAN) IV, sodium chloride, sodium chloride flush, sodium chloride flush, traMADol   Assessment/Plan/Discussion    1. 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  NICM.  - ECHO this admission EF 10% with severe RV dilation and dysfunction. - He is down 30 pounds with CVVHD. Still anuric. - Co-ox improved on higher dose dobutamine Weaning norepi. IABP out.  - PCWP now good but RAP remains very high suggestive of end-stage RV failure - Pull swan today - Not candidate for VAD or transplant so have not pursued ECMO.  (Would need heart kidney. Had ongoing tobacco use on admit and last cocaine use about 3 months ago. Blood type B+) - Main issue now is whether or not kidneys will recover and how long we should continue CVVHD. With inotrope dependence and severe HF, not candidate for iHD. Would like to give it at least another week. If Renal ok with this will plan to move CVVHD catheter to IJ so he can get out of bed. Will d/w Renal.  2. Acute on CKD III:  Anuric. Now on CVVHD (started  11/17).  - Discussion as above 3. PAF:  Maintaining NSR on amio. Resume heparin today, 4. H/O LV thrombus 5. H/o polysubstance abuse (ETOH, cocaine, tobacco use) 6. Hyperkalemia: Resolved CVVH.  9. MSSA sepsis  - Now on Ancef. Will need TEE  if more stabilized.  10. Syncope: VT [employer].  Device interrogation showed multiple episodes of VT. Loading amio per EP. K repleted. No further VT over the last few days. EP following --EP changed to po amio today  The patient is critically ill with multiple organ systems failure and requires high complexity decision making for assessment and support, frequent evaluation and titration of therapies, application of advanced monitoring technologies and extensive interpretation of multiple databases.   Critical Care Time devoted to patient care services described in this note is 35 Minutes.  Length of Stay: 10  Arvilla MeresBensimhon, Felisia Balcom, MD 10/20/2016, 9:49 AM  Advanced Heart Failure Team Pager [phone] (M-F; 7a - 4p)  Please contact [facility] for night-coverage after hours (4p -7a ) and weekends on [email or web address]

## 2016-10-20 NOTE — Progress Notes (Signed)
SUBJECTIVE: The patient remains in very good spirits, tells me he feels very good, denies any SOB or any CP DBA and Levophed lwer today with adequate MAP  IABP out  Still anuric  Wt down 33 lbs ???  .  ceFAZolin (ANCEF) IV  2 g Intravenous Q12H  . insulin aspart  0-5 Units Subcutaneous QHS  . insulin aspart  0-9 Units Subcutaneous TID WC  . rifampin  300 mg Oral Q8H  . senna-docusate  1 tablet Oral Daily  . sodium chloride flush  10-40 mL Intracatheter Q12H  . sodium chloride flush  3 mL Intravenous Q12H   . sodium chloride 10 mL/hr at 10/18/16 1900  . sodium chloride 200 mL/hr at 10/20/16 0211  . amiodarone 30 mg/hr (10/20/16 0800)  . dextrose 5 % 1,000 mL with sodium bicarbonate 150 mEq infusion 75 mL/hr at 10/19/16 2012  . DOBUTamine 10 mcg/kg/min (10/20/16 0208)  . heparin 10,000 units/ 20 mL infusion syringe 1,100 Units/hr (10/20/16 0807)  . norepinephrine (LEVOPHED) Adult infusion 20 mcg/min (10/20/16 0313)  . dialysis replacement fluid (prismasate) 200 mL/hr at 10/19/16 1030  . dialysate (PRISMASATE) 1,500 mL/hr at 10/20/16 0432    OBJECTIVE: Physical Exam: Vitals:   10/20/16 0517 10/20/16 0600 10/20/16 0700 10/20/16 0800  BP:      Pulse: 78 78 78 78  Resp: (!) 23 (!) 21 19 (!) 22  Temp: (!) 96.4 F (35.8 C) (!) 96.3 F (35.7 C) (!) 96.3 F (35.7 C) (!) 96.3 F (35.7 C)  TempSrc:    Core (Comment)  SpO2: 100% 100% 100% 96%  Weight:  150 lb 12.7 oz (68.4 kg)    Height:        Intake/Output Summary (Last 24 hours) at 10/20/16 0837 Last data filed at 10/20/16 0800  Gross per 24 hour  Intake          8885.94 ml  Output             8862 ml  Net            23.94 ml    Telemetry reviewed by myself, Tach 2:1 wit VT NS  Afeb Well developed and nourished in no acute distress HENT normal Neck supple t Clear Regular rate and rhythm, no murmurs or gallops Abd-soft with active BS No Clubbing cyanosis edema Skin-warm and dry A & Oriented  Grossly normal  sensory and motor function        LABS: Basic Metabolic Panel:  Recent Labs  09/81/1911/24/17 0415 10/19/16 1623 10/20/16 0430  NA 127* 128* 129*  K 4.2 4.1 3.9  CL 96* 96* 95*  CO2 18* 21* 22  GLUCOSE 174* 139* 132*  BUN 22* 18 16  CREATININE 2.30* 1.94* 1.86*  CALCIUM 8.2* 8.1* 8.2*  MG 2.3  --  2.2  PHOS 2.7 2.2* 2.4*   Liver Function Tests:  Recent Labs  10/19/16 1623 10/20/16 0430  ALBUMIN 2.7* 2.7*   No results for input(s): LIPASE, AMYLASE in the last 72 hours. CBC:  Recent Labs  10/19/16 0415 10/20/16 0430  WBC 10.9* 9.2  HGB 13.6 13.1  HCT 36.9* 35.6*  MCV 78.2 77.9*  PLT 100* 89*    RADIOLOGY: Dg Chest Port 1 View Result Date: 10/14/2016 CLINICAL DATA:  Fever, chills. EXAM: PORTABLE CHEST 1 VIEW COMPARISON:  Sep 16, 2016 FINDINGS: Left pacer remains in place, unchanged. Right Swan-Ganz catheter is in place with the tip in the right lower lobe pulmonary artery. There is marked cardiomegaly  with vascular congestion. No overt failure. Probable left lower lobe compressive atelectasis. No definite effusion. IMPRESSION: Swan-Ganz catheter in the central right lower lobe pulmonary artery. Marked cardiomegaly and vascular congestion. Electronically Signed   By: Charlett Nose M.D.   On: 10/14/2016 07:24   COOX 59  Amazing   ASSESSMENT AND PLAN:   1. Septic/cardiogenic shock w/MSOF   2.  Bacteremia + , Staph      On cefazolin and rifampin      Appreciate ID input    3. VT storm Rare NSVT  rhythm appears to be continued ATach  10/16/16 device reprogrammed to DDI 60 from DDD 40     Will change amio to po   Continue supportive care   His prognosis remains very grave

## 2016-10-20 NOTE — Progress Notes (Signed)
ANTICOAGULATION CONSULT NOTE - Follow Up Consult  Pharmacy Consult for heparin/cefazolin Indication: PAF and MSSA bacteremia  No Known Allergies  Patient Measurements: Height: 5\' 7"  (170.2 cm) Weight: 150 lb 12.7 oz (68.4 kg) IBW/kg (Calculated) : 66.1 Heparin Dosing Weight: 68 kg  Vital Signs: Temp: 97.5 F (36.4 C) (11/25 1600) Temp Source: Oral (11/25 1600) Pulse Rate: 79 (11/25 1900)  Labs:  Recent Labs  10/18/16 0345  10/19/16 0414 10/19/16 0415 10/19/16 1623 10/20/16 0430 10/20/16 1600 10/20/16 1936  HGB 13.7  --   --  13.6  --  13.1  --   --   HCT 37.8*  --   --  36.9*  --  35.6*  --   --   PLT 110*  --   --  100*  --  89*  --   --   APTT  --   --   --   --   --  43*  --   --   LABPROT 23.3*  --   --  23.9*  --  23.9*  --   --   INR 2.03  --   --  2.10  --  2.10  --   --   HEPARINUNFRC 0.41  --  0.40  --   --   --   --  0.18*  CREATININE 2.28*  < >  --  2.30* 1.94* 1.86* 1.88*  --   < > = values in this interval not displayed.  Estimated Creatinine Clearance: 43.9 mL/min (by C-G formula based on SCr of 1.88 mg/dL (H)).   Medications:  Prescriptions Prior to Admission  Medication Sig Dispense Refill Last Dose  . allopurinol (ZYLOPRIM) 100 MG tablet Take 100 mg by mouth daily.   10/13/2016 at Unknown time  . digoxin (LANOXIN) 0.25 MG tablet Take 0.25 mg by mouth daily.    10/09/2016 at Unknown time  . potassium chloride SA (K-DUR,KLOR-CON) 20 MEQ tablet Take 2 tablets (40 mEq total) by mouth daily. 30 tablet 3 10/09/2016 at Unknown time  . spironolactone (ALDACTONE) 25 MG tablet Take 1 tablet (25 mg total) by mouth daily. 30 tablet 3 10/09/2016 at Unknown time  . torsemide (DEMADEX) 100 MG tablet Take 100 mg by mouth 2 (two) times daily.   10/09/2016 at Unknown time  . warfarin (COUMADIN) 7.5 MG tablet Take 1 tablet (7.5 mg total) by mouth daily at 6 PM. (Patient taking differently: Take 3.75-7.5 mg by mouth daily at 6 PM. Patient takes 7.5 mg everyday except  on Sunday patient takes 3.75 mg) 35 tablet 3 10/09/2016 at 1p  . amiodarone (PACERONE) 200 MG tablet Take 1 tablet (200 mg total) by mouth daily. (Patient not taking: Reported on 10/21/2016) 30 tablet 3 Not Taking at Unknown time  . nicotine (NICODERM CQ - DOSED IN MG/24 HOURS) 14 mg/24hr patch Place 1 patch (14 mg total) onto the skin daily. (Patient not taking: Reported on 10/25/2016) 28 patch 0 Not Taking at Unknown time    Assessment: 50 yo M admitted 10/03/2016 on warfarin PTA for Afib. Patient remains on CRRT and had his IABP pulled 11/24. Heparin was initially held when line was pulled but pharmacy was consulted to resume heparin dosing. Will not bolus patient and use lower goal of 0.2-0.5 per Dr. Gala Romney. -HL= 0.15 on 1550 units/hr   Goal of Therapy:  Heparin level 0.2-0.5 units/ml Monitor platelets by anticoagulation protocol: Yes   Plan:   - Increase heparin to 1650 units/hr -heparin level  and CBC in am  Harland Germanndrew Zackeriah Kissler, [place] 10/20/2016 8:11 PM

## 2016-10-20 NOTE — Progress Notes (Signed)
Assessment/ Plan:   Pt is a 76M with a PMH sig for NICM with EF 10% (04/2016), ICD, h/o LV thrombus, HTN, and atrial fibrillation who has acute oliguric kidney injury in the setting of cardiogenic shock.   1. Cardiogenic shock 2/2 acute systolic heart failure exac: pressors, EF 10%. Swan, IABP  2. Oliguric AKI on CKD: baseline cr 1.2-1.4. Oliguric .  UF well at 100-on pressors 3. Vtach: on amio.  4. Staph aureus bacteremia 11/19  Assess: I think he is pretty desiccated from CRRT, weight down 13kg(hgb 13g after being in the hospital ICU for 10 days).  I worry about severe intravascular volume depletion.  I think it is reasonable to continue to support him with dialysis as needed.  Reduce UF. Changing to a IJ cath also smart.  Will get a CXR.  I would recommend giving him a couple of days off dialysis and see if BP and UOP improve as we often find that we do excess UF and impede hemodynamic recovery; then insertion of IJ cath if needed.   Subjective: Interval History: IABP and SG out  Objective: Vital signs in last 24 hours: Temp:  [96.1 F (35.6 C)-97.9 F (36.6 C)] 96.4 F (35.8 C) (11/25 1200) Pulse Rate:  [77-79] 79 (11/25 1300) Resp:  [16-30] 20 (11/25 1300) SpO2:  [96 %-100 %] 97 % (11/25 1300) Arterial Line BP: (91-116)/(56-67) 105/62 (11/25 1300) Weight:  [68.4 kg (150 lb 12.7 oz)] 68.4 kg (150 lb 12.7 oz) (11/25 0600) Weight change: -3.5 kg (-7 lb 11.5 oz)  Intake/Output from previous day: 11/24 0701 - 11/25 0700 In: 8802.5 [P.O.:480; I.V.:8122.5; IV Piggyback:200] Out: 8758 [Urine:125] Intake/Output this shift: Total I/O In: 2105.5 [P.O.:240; I.V.:1765.5; IV Piggyback:100] Out: 2099 [Other:2099]  General appearance: alert and cooperative Resp: clear to auscultation bilaterally Chest wall: no tenderness Cardio: regular rate and rhythm, S1, S2 normal, no murmur, click, rub or gallop GI: soft, non-tender; bowel sounds normal; no masses,  no organomegaly  No  edema  Lab Results:  Recent Labs  10/19/16 0415 10/20/16 0430  WBC 10.9* 9.2  HGB 13.6 13.1  HCT 36.9* 35.6*  PLT 100* 89*   BMET:  Recent Labs  10/19/16 1623 10/20/16 0430  NA 128* 129*  K 4.1 3.9  CL 96* 95*  CO2 21* 22  GLUCOSE 139* 132*  BUN 18 16  CREATININE 1.94* 1.86*  CALCIUM 8.1* 8.2*   No results for input(s): PTH in the last 72 hours. Iron Studies: No results for input(s): IRON, TIBC, TRANSFERRIN, FERRITIN in the last 72 hours. Studies/Results: No results found.  Scheduled: . amiodarone  400 mg Oral BID  .  ceFAZolin (ANCEF) IV  2 g Intravenous Q12H  . insulin aspart  0-5 Units Subcutaneous QHS  . insulin aspart  0-9 Units Subcutaneous TID WC  . rifampin  300 mg Oral Q8H  . senna-docusate  1 tablet Oral Daily  . sodium chloride flush  10-40 mL Intracatheter Q12H  . sodium chloride flush  3 mL Intravenous Q12H     LOS: 10 days   Lynard Postlewait C 10/20/2016,2:04 PM

## 2016-10-20 NOTE — Progress Notes (Addendum)
ANTICOAGULATION CONSULT NOTE - Follow Up Consult  Pharmacy Consult for heparin/cefazolin Indication: PAF and MSSA bacteremia  No Known Allergies  Patient Measurements: Height: 5\' 7"  (170.2 cm) Weight: 150 lb 12.7 oz (68.4 kg) IBW/kg (Calculated) : 66.1 Heparin Dosing Weight: 68 kg  Vital Signs: Temp: 96.3 F (35.7 C) (11/25 0926) Temp Source: Core (Comment) (11/25 0800) Pulse Rate: 78 (11/25 0926)  Labs:  Recent Labs  10/18/16 0345  10/19/16 0414 10/19/16 0415 10/19/16 1623 10/20/16 0430  HGB 13.7  --   --  13.6  --  13.1  HCT 37.8*  --   --  36.9*  --  35.6*  PLT 110*  --   --  100*  --  89*  APTT  --   --   --   --   --  43*  LABPROT 23.3*  --   --  23.9*  --  23.9*  INR 2.03  --   --  2.10  --  2.10  HEPARINUNFRC 0.41  --  0.40  --   --   --   CREATININE 2.28*  < >  --  2.30* 1.94* 1.86*  < > = values in this interval not displayed.  Estimated Creatinine Clearance: 44.4 mL/min (by C-G formula based on SCr of 1.86 mg/dL (H)).   Medications:  Prescriptions Prior to Admission  Medication Sig Dispense Refill Last Dose  . allopurinol (ZYLOPRIM) 100 MG tablet Take 100 mg by mouth daily.   10/09/2016 at Unknown time  . digoxin (LANOXIN) 0.25 MG tablet Take 0.25 mg by mouth daily.    10/09/2016 at Unknown time  . potassium chloride SA (K-DUR,KLOR-CON) 20 MEQ tablet Take 2 tablets (40 mEq total) by mouth daily. 30 tablet 3 10/09/2016 at Unknown time  . spironolactone (ALDACTONE) 25 MG tablet Take 1 tablet (25 mg total) by mouth daily. 30 tablet 3 10/09/2016 at Unknown time  . torsemide (DEMADEX) 100 MG tablet Take 100 mg by mouth 2 (two) times daily.   10/09/2016 at Unknown time  . warfarin (COUMADIN) 7.5 MG tablet Take 1 tablet (7.5 mg total) by mouth daily at 6 PM. (Patient taking differently: Take 3.75-7.5 mg by mouth daily at 6 PM. Patient takes 7.5 mg everyday except on Sunday patient takes 3.75 mg) 35 tablet 3 10/09/2016 at 1p  . amiodarone (PACERONE) 200 MG tablet  Take 1 tablet (200 mg total) by mouth daily. (Patient not taking: Reported on 10/07/2016) 30 tablet 3 Not Taking at Unknown time  . nicotine (NICODERM CQ - DOSED IN MG/24 HOURS) 14 mg/24hr patch Place 1 patch (14 mg total) onto the skin daily. (Patient not taking: Reported on 10/01/2016) 28 patch 0 Not Taking at Unknown time    Assessment: 50 yo M admitted 10/18/2016 on warfarin PTA for Afib. Patient remains on CRRT and had his IABP pulled 11/24. Heparin was initially held when line was pulled but pharmacy is now consulted to resume heparin dosing. Will not bolus patient and use lower goal of 0.2-0.5 per Dr. Gala RomneyBensimhon. Hgb 13.1 stable, Plts chronically low 89. Patient previously therapeutic on 1550 units/hr will resume at similar rate.  Patient also has MSSA bacteremia and was started on cefazolin on 11/19. Patient still has multiple lines in place that will eventually need to be pulled for optimal outcomes with staph bacteremia. Temp 96.3, WBC wnl.  Goal of Therapy:  Heparin level 0.2-0.5 units/ml Monitor platelets by anticoagulation protocol: Yes   Plan:  - Resume heparin at 1500 units/hr -  Check 8 hr heparin level - Monitor daily heparin level, CBC and signs/symptoms of bleeding - Continue cefazolin 2g IV q12h - Rifampin 300 mg q8h - Monitor renal plans, duration of therapy and clinical progression - Follow up TEE  Casilda Carls, PharmD. PGY-2 Infectious Diseases Pharmacy Resident Pager: [phone] 10/20/2016,9:52 AM

## 2016-10-21 LAB — RENAL FUNCTION PANEL
ALBUMIN: 2.5 g/dL — AB (ref 3.5–5.0)
ALBUMIN: 2.5 g/dL — AB (ref 3.5–5.0)
ANION GAP: 10 (ref 5–15)
ANION GAP: 10 (ref 5–15)
BUN: 14 mg/dL (ref 6–20)
BUN: 15 mg/dL (ref 6–20)
CALCIUM: 7.7 mg/dL — AB (ref 8.9–10.3)
CHLORIDE: 97 mmol/L — AB (ref 101–111)
CO2: 22 mmol/L (ref 22–32)
CO2: 21 mmol/L — AB (ref 22–32)
CREATININE: 1.91 mg/dL — AB (ref 0.61–1.24)
Calcium: 8.2 mg/dL — ABNORMAL LOW (ref 8.9–10.3)
Chloride: 90 mmol/L — ABNORMAL LOW (ref 101–111)
Creatinine, Ser: 1.84 mg/dL — ABNORMAL HIGH (ref 0.61–1.24)
GFR calc Af Amer: 48 mL/min — ABNORMAL LOW (ref >60)
GFR calc Af Amer: 46 mL/min — ABNORMAL LOW (ref >60)
GFR calc non Af Amer: 41 mL/min — ABNORMAL LOW (ref >60)
GFR calc non Af Amer: 39 mL/min — ABNORMAL LOW (ref >60)
GLUCOSE: 203 mg/dL — AB (ref 65–99)
GLUCOSE: 262 mg/dL — AB (ref 65–99)
PHOSPHORUS: 2.4 mg/dL — AB (ref 2.5–4.6)
POTASSIUM: 4 mmol/L (ref 3.5–5.1)
Phosphorus: 2.1 mg/dL — ABNORMAL LOW (ref 2.5–4.6)
Potassium: 3.7 mmol/L (ref 3.5–5.1)
SODIUM: 121 mmol/L — AB (ref 135–145)
Sodium: 129 mmol/L — ABNORMAL LOW (ref 135–145)

## 2016-10-21 LAB — CULTURE, BLOOD (ROUTINE X 2)
CULTURE: NO GROWTH
Culture: NO GROWTH

## 2016-10-21 LAB — GLUCOSE, CAPILLARY
GLUCOSE-CAPILLARY: 118 mg/dL — AB (ref 65–99)
GLUCOSE-CAPILLARY: 111 mg/dL — AB (ref 65–99)
Glucose-Capillary: 130 mg/dL — ABNORMAL HIGH (ref 65–99)
Glucose-Capillary: 144 mg/dL — ABNORMAL HIGH (ref 65–99)

## 2016-10-21 LAB — COOXEMETRY PANEL
CARBOXYHEMOGLOBIN: 1.5 % (ref 0.5–1.5)
METHEMOGLOBIN: 1.2 % (ref 0.0–1.5)
O2 SAT: 52.4 %
Total hemoglobin: 12 g/dL (ref 12.0–16.0)

## 2016-10-21 LAB — CBC
HCT: 34.5 % — ABNORMAL LOW (ref 39.0–52.0)
Hemoglobin: 12.4 g/dL — ABNORMAL LOW (ref 13.0–17.0)
MCH: 28.3 pg (ref 26.0–34.0)
MCHC: 35.9 g/dL (ref 30.0–36.0)
MCV: 78.8 fL (ref 78.0–100.0)
PLATELETS: 96 10*3/uL — AB (ref 150–400)
RBC: 4.38 MIL/uL (ref 4.22–5.81)
RDW: 20.6 % — AB (ref 11.5–15.5)
WBC: 8.4 10*3/uL (ref 4.0–10.5)

## 2016-10-21 LAB — PROTIME-INR
INR: 2.1
Prothrombin Time: 23.9 seconds — ABNORMAL HIGH (ref 11.4–15.2)

## 2016-10-21 LAB — HEPARIN LEVEL (UNFRACTIONATED): Heparin Unfractionated: 0.35 IU/mL (ref 0.30–0.70)

## 2016-10-21 LAB — APTT: APTT: 149 s — AB (ref 24–36)

## 2016-10-21 LAB — MAGNESIUM: Magnesium: 2.3 mg/dL (ref 1.7–2.4)

## 2016-10-21 NOTE — Progress Notes (Signed)
SUBJECTIVE: The patient remains in very good spirits, tells me he feels very good, denies any SOB or any CP DBA and Levophed higher today a little bit.  IABP out  Still anuric     Coox 52   . amiodarone  400 mg Oral BID  .  ceFAZolin (ANCEF) IV  2 g Intravenous Q12H  . insulin aspart  0-5 Units Subcutaneous QHS  . insulin aspart  0-9 Units Subcutaneous TID WC  . rifampin  300 mg Oral Q8H  . senna-docusate  1 tablet Oral Daily  . sodium chloride flush  10-40 mL Intracatheter Q12H   . sodium chloride 10 mL/hr at 10/21/16 1000  . sodium chloride 200 mL/hr at 10/20/16 2013  . DOBUTamine 10 mcg/kg/min (10/21/16 1000)  . heparin 1,650 Units/hr (10/21/16 1000)  . norepinephrine (LEVOPHED) Adult infusion 18.987 mcg/min (10/21/16 1000)  . dialysis replacement fluid (prismasate) 200 mL/hr at 10/20/16 1204  . dialysate (PRISMASATE) 1,500 mL/hr at 10/21/16 0735    OBJECTIVE: Physical Exam: Vitals:   10/21/16 0700 10/21/16 0800 10/21/16 0900 10/21/16 1000  BP: (!) 83/72 98/73 101/69 105/65  Pulse: 86 76 80 82  Resp: 19 (!) 26 16 (!) 25  Temp:  97.7 F (36.5 C)    TempSrc:  Oral    SpO2: 99% 97% 97% 97%  Weight:      Height:        Intake/Output Summary (Last 24 hours) at 10/21/16 1006 Last data filed at 10/21/16 1000  Gross per 24 hour  Intake          2957.71 ml  Output             4772 ml  Net         -1814.29 ml    Telemetry reviewed by myself, Atrial tach with complete heart block   Afeb Well developed and nourished in no acute distress HENT normal Neck supple   Clear Regular rate and rhythm, no murmurs or gallops Abd-soft with active BS No Clubbing cyanosis edema Skin-warm and dry A & Oriented  Grossly normal sensory and motor function        LABS: Basic Metabolic Panel:  Recent Labs  62/13/0811/25/17 0430 10/20/16 1600 10/21/16 0640  NA 129* 130* 129*  K 3.9 4.0 4.0  CL 95* 98* 97*  CO2 22 21* 22  GLUCOSE 132* 88 203*  BUN 16 15 14   CREATININE  1.86* 1.88* 1.84*  CALCIUM 8.2* 8.3* 8.2*  MG 2.2  --  2.3  PHOS 2.4* 2.5 2.1*   Liver Function Tests:  Recent Labs  10/20/16 1600 10/21/16 0640  ALBUMIN 2.8* 2.5*   No results for input(s): LIPASE, AMYLASE in the last 72 hours. CBC:  Recent Labs  10/20/16 0430 10/21/16 0640  WBC 9.2 8.4  HGB 13.1 12.4*  HCT 35.6* 34.5*  MCV 77.9* 78.8  PLT 89* 96*    RADIOLOGY: Dg Chest Port 1 View Result Date: 10/14/2016 CLINICAL DATA:  Fever, chills. EXAM: PORTABLE CHEST 1 VIEW COMPARISON:  10/20/2016 FINDINGS: Left pacer remains in place, unchanged. Right Swan-Ganz catheter is in place with the tip in the right lower lobe pulmonary artery. There is marked cardiomegaly with vascular congestion. No overt failure. Probable left lower lobe compressive atelectasis. No definite effusion. IMPRESSION: Swan-Ganz catheter in the central right lower lobe pulmonary artery. Marked cardiomegaly and vascular congestion. Electronically Signed   By: Charlett NoseKevin  Dover M.D.   On: 10/14/2016 07:24   COOX 52  Amazing  ASSESSMENT AND PLAN:   1. Septic/cardiogenic shock w/MSOF   2.  Bacteremia + , Staph      On cefazolin and rifampin      Appreciate ID input    3. VT storm Rare NSVT  rhythm appears to be continued ATach  10/16/16 device reprogrammed to DDI 60 from DDD 40    4. Gout attack   5. Complete Heart block new  With VR at 75    Amio  Continue may have contributed to heart block  Endocarditis may also be culprit, so will have to follow  Will give prednisone for gout x 3 days  With HD total colchine dose is 0.6 mg  So will avoid  Continue supportive care   His prognosis remains very grave

## 2016-10-21 NOTE — Progress Notes (Signed)
Assessment/ Plan:   Pt is a 33M with a PMH sig for NICM with EF 10% (04/2016), ICD, h/o LV thrombus, HTN, and atrial fibrillation who has acute oliguric kidney injury in the setting of cardiogenic shock.   1. Cardiogenic shock 2/2 acute systolic heart failure exac: pressors, EF 10%. Swan, IABP  2. Oliguric AKI on CKD: baseline cr 1.2-1.4. Oliguric .  UF well at 100-on pressors 3. Vtach: on amio.  4. Staph aureus bacteremia 11/19  Assess: I think he has reversible renal failure, but heart failure and shock and extracorporeal volume removal is preventing recovery.  Will support with CRRT as long as needed. I hope BP will support OOB.  Subjective: Interval History: IABP and SG out, remains on CRRT  Objective: Vital signs in last 24 hours: Temp:  [97.3 F (36.3 C)-97.7 F (36.5 C)] 97.5 F (36.4 C) (11/26 1200) Pulse Rate:  [76-92] 85 (11/26 1300) Resp:  [12-26] 21 (11/26 1300) BP: (78-105)/(60-73) 81/66 (11/26 1300) SpO2:  [93 %-100 %] 97 % (11/26 1300) Arterial Line BP: (67-105)/(55-67) 77/61 (11/26 1100) Weight:  [67.7 kg (149 lb 4 oz)] 67.7 kg (149 lb 4 oz) (11/26 0500) Weight change: -0.7 kg (-1 lb 8.7 oz)  Intake/Output from previous day: 11/25 0701 - 11/26 0700 In: 3916.8 [P.O.:780; I.V.:2936.8; IV Piggyback:200] Out: 5589  Intake/Output this shift: Total I/O In: 534.3 [P.O.:150; I.V.:334.3; IV Piggyback:50] Out: 1034 [Other:1034]  General appearance: alert and cooperative Resp: clear to auscultation bilaterally Chest wall: no tenderness Cardio: regular rate and rhythm, S1, S2 normal, no murmur, click, rub or gallop Extremities: extremities normal, atraumatic, no cyanosis or edema  Lab Results:  Recent Labs  10/20/16 0430 10/21/16 0640  WBC 9.2 8.4  HGB 13.1 12.4*  HCT 35.6* 34.5*  PLT 89* 96*   BMET:  Recent Labs  10/20/16 1600 10/21/16 0640  NA 130* 129*  K 4.0 4.0  CL 98* 97*  CO2 21* 22  GLUCOSE 88 203*  BUN 15 14  CREATININE 1.88* 1.84*   CALCIUM 8.3* 8.2*   No results for input(s): PTH in the last 72 hours. Iron Studies: No results for input(s): IRON, TIBC, TRANSFERRIN, FERRITIN in the last 72 hours. Studies/Results: Dg Chest 1 View  Result Date: 10/20/2016 CLINICAL DATA:  Congestive heart failure, fever EXAM: CHEST 1 VIEW COMPARISON:  10/14/2016 FINDINGS: Significant cardiomegaly again noted. Dual lead cardiac pacemaker is unchanged in position. Stable right arm PICC line position. Right IJ sheath with tip in upper SVC. No infiltrate or pulmonary edema. Mild left basilar atelectasis. Right Swan-Ganz catheter has been removed. IMPRESSION: Cardiomegaly again noted. Dual lead cardiac pacemaker is unchanged in position. Stable right arm PICC line position. No infiltrate or pulmonary edema. Mild left basilar atelectasis. Electronically Signed   By: Natasha Mead M.D.   On: 10/20/2016 15:47   Scheduled: . amiodarone  400 mg Oral BID  .  ceFAZolin (ANCEF) IV  2 g Intravenous Q12H  . insulin aspart  0-5 Units Subcutaneous QHS  . insulin aspart  0-9 Units Subcutaneous TID WC  . rifampin  300 mg Oral Q8H  . senna-docusate  1 tablet Oral Daily  . sodium chloride flush  10-40 mL Intracatheter Q12H     LOS: 11 days   Parker Johnson C 10/21/2016,1:24 PM

## 2016-10-21 NOTE — Progress Notes (Signed)
Patient ID: Parker Johnson, male   DOB: 1966/09/29, 50 y.o.   MRN: [id number]    Advanced Heart Failure Rounding Note   Subjective:    Admitted with VT/syncope and profound cardiogenic shock. PICC placed with initial CO-OX 26%.   Underwent IABP and Trialysis cath placement on 11/17  On 11/19 developed sepsis. Bcx + for MSSA. Now on Ancef. F/u cultures negative.  IABP pulled 11/24. Ernestine ConradSwan out 11/25  Continues to feel good this morning. Denies SOB. Says he had a little urine output with BM last night otherwise no U/O.   Now on dobutamine 10 and norepi 19. IV amio switched to po. Was in atrial tach with 2:1 HB. Now in asystole.  Co-ox 35% -> 45% -> 60% -> 52%  CVP down to 20.   INR unchanged at 2.10. Heparin back on.   F/u bcx are negative.   Objective:   Weight Range:  Vital Signs:   Temp:  [96.3 F (35.7 C)-97.7 F (36.5 C)] 97.7 F (36.5 C) (11/26 0800) Pulse Rate:  [76-92] 82 (11/26 1000) Resp:  [16-26] 25 (11/26 1000) BP: (78-105)/(60-73) 105/65 (11/26 1000) SpO2:  [93 %-100 %] 97 % (11/26 1000) Arterial Line BP: (67-105)/(55-67) 81/65 (11/26 1000) Weight:  [67.7 kg (149 lb 4 oz)] 67.7 kg (149 lb 4 oz) (11/26 0500) Last BM Date: 10/20/16  Weight change: Filed Weights   10/19/16 0400 10/20/16 0600 10/21/16 0500  Weight: 71.9 kg (158 lb 8.2 oz) 68.4 kg (150 lb 12.7 oz) 67.7 kg (149 lb 4 oz)    Intake/Output:   Intake/Output Summary (Last 24 hours) at 10/21/16 1049 Last data filed at 10/21/16 1000  Gross per 24 hour  Intake          2957.71 ml  Output             4869 ml  Net         -1911.29 ml     Physical Exam:  General:  Lying in bed. NAD HEENT: Normal Neck: supple. JVP remains elevated to ear. RIJ cordis . Carotids 2+ bilat; no bruits. No thyromegaly or nodule noted.  Cor: PMI nondisplaced. RRR. No murmurs or rubs. +S3 Lungs:clear   Abdomen: soft, NT, ND, no HSM. No bruits or masses. +BS  Extremities: no cyanosis, clubbing, rash  RUE PICC. No edema.  Ted hose present. R femoral trialysis cath and IABP.  Neuro: awake. Follows commands cranial nerves grossly intact. moves all 4 extremities w/o difficulty.   Telemetry: Reviewed, atrial tach with CHB  Labs: Basic Metabolic Panel:  Recent Labs Lab 10/17/16 0330  10/18/16 0345  10/19/16 0415 10/19/16 1623 10/20/16 0430 10/20/16 1600 10/21/16 0640  NA 127*  < > 126*  < > 127* 128* 129* 130* 129*  K 4.4  < > 4.7  < > 4.2 4.1 3.9 4.0 4.0  CL 97*  < > 98*  < > 96* 96* 95* 98* 97*  CO2 17*  < > 16*  < > 18* 21* 22 21* 22  GLUCOSE 126*  < > 169*  < > 174* 139* 132* 88 203*  BUN 15  < > 20  < > 22* 18 16 15 14   CREATININE 1.84*  < > 2.28*  < > 2.30* 1.94* 1.86* 1.88* 1.84*  CALCIUM 8.3*  < > 8.5*  < > 8.2* 8.1* 8.2* 8.3* 8.2*  MG 1.8  --  2.5*  --  2.3  --  2.2  --  2.3  PHOS  2.4*  < > 2.8  < > 2.7 2.2* 2.4* 2.5 2.1*  < > = values in this interval not displayed.  Liver Function Tests:  Recent Labs Lab 10/19/16 0415 10/19/16 1623 10/20/16 0430 10/20/16 1600 10/21/16 0640  ALBUMIN 2.9* 2.7* 2.7* 2.8* 2.5*   No results for input(s): LIPASE, AMYLASE in the last 168 hours. No results for input(s): AMMONIA in the last 168 hours.  CBC:  Recent Labs Lab 10/17/16 0330 10/18/16 0345 10/19/16 0415 10/20/16 0430 10/21/16 0640  WBC 8.1 8.3 10.9* 9.2 8.4  HGB 12.7* 13.7 13.6 13.1 12.4*  HCT 35.7* 37.8* 36.9* 35.6* 34.5*  MCV 80.4 79.4 78.2 77.9* 78.8  PLT 96* 110* 100* 89* 96*    Cardiac Enzymes: No results for input(s): CKTOTAL, CKMB, CKMBINDEX, TROPONINI in the last 168 hours.  BNP: BNP (last 3 results)  Recent Labs  05/16/16 0420 06/21/16 1218 2016/10/28 0957  BNP 2,453.6* 2,504.3* 3,542.3*    ProBNP (last 3 results) No results for input(s): PROBNP in the last 8760 hours.    Other results:  Imaging: Dg Chest 1 View  Result Date: 10/20/2016 CLINICAL DATA:  Congestive heart failure, fever EXAM: CHEST 1 VIEW COMPARISON:  10/14/2016 FINDINGS: Significant  cardiomegaly again noted. Dual lead cardiac pacemaker is unchanged in position. Stable right arm PICC line position. Right IJ sheath with tip in upper SVC. No infiltrate or pulmonary edema. Mild left basilar atelectasis. Right Swan-Ganz catheter has been removed. IMPRESSION: Cardiomegaly again noted. Dual lead cardiac pacemaker is unchanged in position. Stable right arm PICC line position. No infiltrate or pulmonary edema. Mild left basilar atelectasis. Electronically Signed   By: Natasha Mead M.D.   On: 10/20/2016 15:47     Medications:     Scheduled Medications: . amiodarone  400 mg Oral BID  .  ceFAZolin (ANCEF) IV  2 g Intravenous Q12H  . insulin aspart  0-5 Units Subcutaneous QHS  . insulin aspart  0-9 Units Subcutaneous TID WC  . rifampin  300 mg Oral Q8H  . senna-docusate  1 tablet Oral Daily  . sodium chloride flush  10-40 mL Intracatheter Q12H    Infusions: . sodium chloride 10 mL/hr at 10/21/16 1000  . sodium chloride 200 mL/hr at 10/20/16 2013  . DOBUTamine 10 mcg/kg/min (10/21/16 1000)  . heparin 1,650 Units/hr (10/21/16 1000)  . norepinephrine (LEVOPHED) Adult infusion 18.987 mcg/min (10/21/16 1000)  . dialysis replacement fluid (prismasate) 200 mL/hr at 10/20/16 1204  . dialysate (PRISMASATE) 1,500 mL/hr at 10/21/16 0735    PRN Medications: acetaminophen, bisacodyl, heparin, heparin, magnesium hydroxide, ondansetron (ZOFRAN) IV, sodium chloride, sodium chloride flush, traMADol   Assessment/Plan/Discussion    1. 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  NICM.  - ECHO this admission EF 10% with severe RV dilation and dysfunction. - He is down 31 pounds with CVVHD. Still anuric. - Co-ox improved on higher dose dobutamine Trying to wean norepi. - CVP coming down slowly but unlikely to get much better with severe RV dysfunction - Not candidate for VAD or transplant so have not pursued ECMO.  (Would need heart kidney. Had  ongoing tobacco use on admit and last cocaine use about 3 months ago. Blood type B+) - Main issue now is whether or not kidneys will recover and how long we should continue CVVHD. With inotrope dependence and severe HF, not candidate for iHD. Would like to give it at least another week. Renal ok with this. Thus will plan to move  CVVHD catheter to IJ in am so he can get out of bed. Make NPO. Call IR in am.  2. Acute on CKD III:  Anuric. Now on CVVHD (started 11/17).  - Discussion as above 3. PAF/atrial tach:  Remains in atrial tach. Now in CHB. D/w Dr. Graciela Husbands. Will consider overdrive pacing in am. Repeat echo to look for vegetation/abscess 4. H/O LV thrombus 5. H/o polysubstance abuse (ETOH, cocaine, tobacco use) 6. Hyperkalemia: Resolved CVVH.  9. MSSA sepsis  - Now on Ancef and rifampin. With CHB will proceed with echo to look for endocarditis though f/u cx are negative. May need TEE if/when more stable 10. Syncope: VT [employer].  Device interrogation showed multiple episodes of VT. Loading amio per EP. K repleted. No further VT over the last few days. EP following --On po amio  The patient is critically ill with multiple organ systems failure and requires high complexity decision making for assessment and support, frequent evaluation and titration of therapies, application of advanced monitoring technologies and extensive interpretation of multiple databases.   Critical Care Time devoted to patient care services described in this note is 35 Minutes.  Length of Stay: 11  Arvilla Meres, MD 10/21/2016, 10:49 AM  Advanced Heart Failure Team Pager [phone] (M-F; 7a - 4p)  Please contact [facility] for night-coverage after hours (4p -7a ) and weekends on [email or web address]

## 2016-10-21 NOTE — Progress Notes (Signed)
ANTICOAGULATION CONSULT NOTE - Follow Up Consult  Pharmacy Consult for heparin Indication: atrial fibrillation  No Known Allergies  Patient Measurements: Height: 5\' 7"  (170.2 cm) Weight: 149 lb 4 oz (67.7 kg) IBW/kg (Calculated) : 66.1 Heparin Dosing Weight: 67 kg  Vital Signs: Temp: 97.3 F (36.3 C) (11/26 0400) Temp Source: Oral (11/25 2000) BP: 83/72 (11/26 0700) Pulse Rate: 86 (11/26 0700)  Labs:  Recent Labs  10/19/16 0414 10/19/16 0415 10/19/16 1623 10/20/16 0430 10/20/16 1600 10/20/16 1936  HGB  --  13.6  --  13.1  --   --   HCT  --  36.9*  --  35.6*  --   --   PLT  --  100*  --  89*  --   --   APTT  --   --   --  43*  --   --   LABPROT  --  23.9*  --  23.9*  --   --   INR  --  2.10  --  2.10  --   --   HEPARINUNFRC 0.40  --   --   --   --  0.18*  CREATININE  --  2.30* 1.94* 1.86* 1.88*  --     Estimated Creatinine Clearance: 43.9 mL/min (by C-G formula based on SCr of 1.88 mg/dL (H)).   Medications:  Prescriptions Prior to Admission  Medication Sig Dispense Refill Last Dose  . allopurinol (ZYLOPRIM) 100 MG tablet Take 100 mg by mouth daily.   11/04/16 at Unknown time  . digoxin (LANOXIN) 0.25 MG tablet Take 0.25 mg by mouth daily.    10/09/2016 at Unknown time  . potassium chloride SA (K-DUR,KLOR-CON) 20 MEQ tablet Take 2 tablets (40 mEq total) by mouth daily. 30 tablet 3 10/09/2016 at Unknown time  . spironolactone (ALDACTONE) 25 MG tablet Take 1 tablet (25 mg total) by mouth daily. 30 tablet 3 10/09/2016 at Unknown time  . torsemide (DEMADEX) 100 MG tablet Take 100 mg by mouth 2 (two) times daily.   10/09/2016 at Unknown time  . warfarin (COUMADIN) 7.5 MG tablet Take 1 tablet (7.5 mg total) by mouth daily at 6 PM. (Patient taking differently: Take 3.75-7.5 mg by mouth daily at 6 PM. Patient takes 7.5 mg everyday except on Sunday patient takes 3.75 mg) 35 tablet 3 10/09/2016 at 1p  . amiodarone (PACERONE) 200 MG tablet Take 1 tablet (200 mg total) by  mouth daily. (Patient not taking: Reported on 11-04-2016) 30 tablet 3 Not Taking at Unknown time  . nicotine (NICODERM CQ - DOSED IN MG/24 HOURS) 14 mg/24hr patch Place 1 patch (14 mg total) onto the skin daily. (Patient not taking: Reported on 04-Nov-2016) 28 patch 0 Not Taking at Unknown time    Assessment: 50 yo M admitted 11-04-16 on warfarin PTA for Afib. Patient remains on CRRT. Pharmacy consulted to dose heparin while holding warfarin.   Heparin level 0.35 (therapeutic), Hgb stable 12.4, Plt 96 low but stable, No signs/symptoms of bleeding noted  Goal of Therapy:  Heparin level 0.2-0.5 units/ml Monitor platelets by anticoagulation protocol: Yes   Plan:  - Continue heparin 1650 units/hr - Monitor daily heparin level, CBC and signs/symptoms of bleeding  Casilda Carls, PharmD. PGY-2 Infectious Diseases Pharmacy Resident Pager: [phone] 10/21/2016,7:17 AM

## 2016-10-22 DIAGNOSIS — I471 Supraventricular tachycardia: Secondary | ICD-10-CM

## 2016-10-22 DIAGNOSIS — N186 End stage renal disease: Secondary | ICD-10-CM

## 2016-10-22 LAB — GLUCOSE, CAPILLARY
GLUCOSE-CAPILLARY: 268 mg/dL — AB (ref 65–99)
GLUCOSE-CAPILLARY: 140 mg/dL — AB (ref 65–99)
Glucose-Capillary: 91 mg/dL (ref 65–99)
Glucose-Capillary: 153 mg/dL — ABNORMAL HIGH (ref 65–99)

## 2016-10-22 LAB — COOXEMETRY PANEL
CARBOXYHEMOGLOBIN: 2.4 % — AB (ref 0.5–1.5)
Methemoglobin: 1.1 % (ref 0.0–1.5)
O2 Saturation: 56.7 %
Total hemoglobin: 12.6 g/dL (ref 12.0–16.0)

## 2016-10-22 LAB — CBC
HEMATOCRIT: 34.1 % — AB (ref 39.0–52.0)
HEMOGLOBIN: 12.4 g/dL — AB (ref 13.0–17.0)
MCH: 28.6 pg (ref 26.0–34.0)
MCHC: 36.4 g/dL — ABNORMAL HIGH (ref 30.0–36.0)
MCV: 78.8 fL (ref 78.0–100.0)
Platelets: 108 10*3/uL — ABNORMAL LOW (ref 150–400)
RBC: 4.33 MIL/uL (ref 4.22–5.81)
RDW: 21 % — ABNORMAL HIGH (ref 11.5–15.5)
WBC: 8.5 10*3/uL (ref 4.0–10.5)

## 2016-10-22 LAB — RENAL FUNCTION PANEL
ANION GAP: 9 (ref 5–15)
ANION GAP: 8 (ref 5–15)
Albumin: 2.8 g/dL — ABNORMAL LOW (ref 3.5–5.0)
Albumin: 2.4 g/dL — ABNORMAL LOW (ref 3.5–5.0)
BUN: 13 mg/dL (ref 6–20)
BUN: 15 mg/dL (ref 6–20)
CALCIUM: 8.5 mg/dL — AB (ref 8.9–10.3)
CALCIUM: 8 mg/dL — AB (ref 8.9–10.3)
CO2: 22 mmol/L (ref 22–32)
CO2: 23 mmol/L (ref 22–32)
CREATININE: 1.57 mg/dL — AB (ref 0.61–1.24)
Chloride: 99 mmol/L — ABNORMAL LOW (ref 101–111)
Chloride: 95 mmol/L — ABNORMAL LOW (ref 101–111)
Creatinine, Ser: 1.75 mg/dL — ABNORMAL HIGH (ref 0.61–1.24)
GFR calc non Af Amer: 44 mL/min — ABNORMAL LOW (ref >60)
GFR, EST AFRICAN AMERICAN: 58 mL/min — AB (ref >60)
GFR, EST AFRICAN AMERICAN: 51 mL/min — AB (ref >60)
GFR, EST NON AFRICAN AMERICAN: 50 mL/min — AB (ref >60)
Glucose, Bld: 151 mg/dL — ABNORMAL HIGH (ref 65–99)
Glucose, Bld: 354 mg/dL — ABNORMAL HIGH (ref 65–99)
PHOSPHORUS: 4.1 mg/dL (ref 2.5–4.6)
POTASSIUM: 4.7 mmol/L (ref 3.5–5.1)
Phosphorus: 1.9 mg/dL — ABNORMAL LOW (ref 2.5–4.6)
Potassium: 4 mmol/L (ref 3.5–5.1)
SODIUM: 130 mmol/L — AB (ref 135–145)
SODIUM: 126 mmol/L — AB (ref 135–145)

## 2016-10-22 LAB — TYPE AND SCREEN
ABO/RH(D): B POS
Antibody Screen: NEGATIVE

## 2016-10-22 LAB — APTT: aPTT: 164 seconds (ref 24–36)

## 2016-10-22 LAB — PROTIME-INR
INR: 1.87
PROTHROMBIN TIME: 21.8 s — AB (ref 11.4–15.2)

## 2016-10-22 LAB — HEPARIN LEVEL (UNFRACTIONATED): HEPARIN UNFRACTIONATED: 0.48 [IU]/mL (ref 0.30–0.70)

## 2016-10-22 LAB — MAGNESIUM: MAGNESIUM: 2.3 mg/dL (ref 1.7–2.4)

## 2016-10-22 MED ORDER — HEPARIN SODIUM (PORCINE) 1000 UNIT/ML DIALYSIS
1000.0000 [IU] | INTRAMUSCULAR | Status: DC | PRN
  Administered 2016-10-27: 3800 [IU] via INTRAVENOUS_CENTRAL
  Administered 2016-10-28: 4000 [IU] via INTRAVENOUS_CENTRAL
  Filled 2016-10-22: qty 1
  Filled 2016-10-22: qty 4
  Filled 2016-10-22 (×2): qty 6
  Filled 2016-10-22 (×2): qty 3
  Filled 2016-10-22: qty 2
  Filled 2016-10-22: qty 6
  Filled 2016-10-22: qty 2

## 2016-10-22 MED ORDER — SODIUM CHLORIDE 0.9 % IV SOLN
Freq: Once | INTRAVENOUS | Status: AC
  Administered 2016-10-22: 10 mL/h via INTRAVENOUS

## 2016-10-22 MED ORDER — PRISMASOL BGK 4/2.5 32-4-2.5 MEQ/L IV SOLN
INTRAVENOUS | Status: DC
Start: 2016-10-22 — End: 2016-10-30
  Administered 2016-10-22 – 2016-10-29 (×22): via INTRAVENOUS_CENTRAL
  Filled 2016-10-22 (×29): qty 5000

## 2016-10-22 MED ORDER — PRISMASOL BGK 4/2.5 32-4-2.5 MEQ/L IV SOLN
INTRAVENOUS | Status: DC
  Administered 2016-10-22 – 2016-10-30 (×46): via INTRAVENOUS_CENTRAL
  Filled 2016-10-22 (×64): qty 5000

## 2016-10-22 MED ORDER — MIDODRINE HCL 5 MG PO TABS
2.5000 mg | ORAL_TABLET | Freq: Three times a day (TID) | ORAL | Status: DC
  Administered 2016-10-22 (×3): 2.5 mg via ORAL
  Filled 2016-10-22 (×4): qty 1

## 2016-10-22 MED ORDER — HEPARIN SODIUM (PORCINE) 5000 UNIT/ML IJ SOLN
250.0000 [IU]/h | INTRAMUSCULAR | Status: DC
  Administered 2016-10-22: 250 [IU]/h via INTRAVENOUS_CENTRAL
  Filled 2016-10-22: qty 2

## 2016-10-22 MED ORDER — SODIUM CHLORIDE 0.9 % FOR CRRT
INTRAVENOUS_CENTRAL | Status: DC | PRN
  Filled 2016-10-22: qty 1000

## 2016-10-22 MED ORDER — PRISMASOL BGK 4/2.5 32-4-2.5 MEQ/L IV SOLN
INTRAVENOUS | Status: DC
  Administered 2016-10-22 – 2016-10-29 (×25): via INTRAVENOUS_CENTRAL
  Filled 2016-10-22 (×29): qty 5000

## 2016-10-22 MED ORDER — HEPARIN BOLUS VIA INFUSION (CRRT)
1000.0000 [IU] | INTRAVENOUS | Status: DC | PRN
  Filled 2016-10-22: qty 1000

## 2016-10-22 MED ORDER — DEXTROSE 5 % IV SOLN
30.0000 mmol | Freq: Once | INTRAVENOUS | Status: AC
  Administered 2016-10-22: 30 mmol via INTRAVENOUS
  Filled 2016-10-22: qty 10

## 2016-10-22 NOTE — Progress Notes (Signed)
[facility]    Date of Admission:  10/17/2016   Total days of antibiotics 9        Day 9 cefazolin        Day 5 rifampin           ID: Parker Johnson is a 50 y.o. male  wwith NICM admitted for severe cardiogenic shock requiring vasopressors, inotropes, IABP, developing AKI, now on dialysiswho has had multiple lines placed out of necessity including PICC, trialysis line, swann-ganz, arterial lineand IABP to treat cardiogenic shock now complicated with MSSA bacteremia. Removed IABP on 11/24, removal of swann on 11/25 Active Problems:   Ventricular fibrillation (HCC)   Cardiogenic shock (HCC)   AKI (acute kidney injury) (HCC)   Septic shock (HCC)   Staphylococcus aureus bacteremia   Atrial tachycardia (HCC)   Fever and chills   Syncope and collapse   Infected defibrillator (HCC)   Acute renal failure (HCC)   Central line infection    Subjective: Afebrile, patient in good spirits. He reports with weight is down by 35 lb. Being evaluated for tunneled HD and removal of trialysis catheter  Medications:  . sodium chloride   Intravenous Once  . amiodarone  400 mg Oral BID  .  ceFAZolin (ANCEF) IV  2 g Intravenous Q12H  . insulin aspart  0-5 Units Subcutaneous QHS  . insulin aspart  0-9 Units Subcutaneous TID WC  . midodrine  2.5 mg Oral TID WC  . rifampin  300 mg Oral Q8H  . senna-docusate  1 tablet Oral Daily  . sodium chloride flush  10-40 mL Intracatheter Q12H  . sodium phosphate  Dextrose 5% IVPB  30 mmol Intravenous Once    Objective: Vital signs in last 24 hours: Temp:  [97.3 F (36.3 C)-97.9 F (36.6 C)] 97.5 F (36.4 C) (11/27 1215) Pulse Rate:  [75-93] 92 (11/27 1100) Resp:  [9-33] 20 (11/27 1100) BP: (71-100)/(59-77) 87/59 (11/27 1100) SpO2:  [95 %-98 %] 95 % (11/27 1100) Weight:  [145 lb 4.5 oz (65.9 kg)] 145 lb 4.5 oz (65.9 kg) (11/27 0400) Physical Exam  Constitutional: He is oriented to person, place, and time. He appears  well-developed and well-nourished. No distress.  HENT:  Mouth/Throat: Oropharynx is clear and moist. No oropharyngeal exudate.  Cardiovascular: Normal rate, regular rhythm and normal heart sounds. +S3 noted.  no gallop and no friction rub.  Pulmonary/Chest: Effort normal and breath sounds normal. No respiratory distress. He has no wheezes.  Abdominal: Soft. Bowel sounds are normal. He exhibits no distension. There is no tenderness. Right fem Hd line Neurological: He is alert and oriented to person, place, and time.  Ext: right picc line Psychiatric: He has a normal mood and affect. His behavior is normal.     Lab Results  Recent Labs  10/21/16 0640 10/21/16 1614 10/22/16 0540 10/22/16 0806  WBC 8.4  --  8.5  --   HGB 12.4*  --  12.4*  --   HCT 34.5*  --  34.1*  --   NA 129* 121*  --  130*  K 4.0 3.7  --  4.0  CL 97* 90*  --  99*  CO2 22 21*  --  22  BUN 14 15  --  13  CREATININE 1.84* 1.91*  --  1.57*   Liver Panel  Recent Labs  10/21/16 1614 10/22/16 0806  ALBUMIN 2.5* 2.8*    Microbiology: 11/21 blood cx ngtd 11/19 blood cx  MSSA Studies/Results: Dg Chest 1 View  Result Date: 10/20/2016 CLINICAL DATA:  Congestive heart failure, fever EXAM: CHEST 1 VIEW COMPARISON:  10/14/2016 FINDINGS: Significant cardiomegaly again noted. Dual lead cardiac pacemaker is unchanged in position. Stable right arm PICC line position. Right IJ sheath with tip in upper SVC. No infiltrate or pulmonary edema. Mild left basilar atelectasis. Right Swan-Ganz catheter has been removed. IMPRESSION: Cardiomegaly again noted. Dual lead cardiac pacemaker is unchanged in position. Stable right arm PICC line position. No infiltrate or pulmonary edema. Mild left basilar atelectasis. Electronically Signed   By: Natasha MeadLiviu  Pop M.D.   On: 10/20/2016 15:47     Assessment/Plan: Complicated MSSA bacteremia - continues to have original lines removed. He has nearly had all lines that were placed on admit  discontinued/changed except for picc line and trialysis fem line. Continue with cefazolin and rifampin with plan for 6 wk but may need chronic suppression  It would be ideal to get TEE to evaluated ICD, however, given his severity of heart failure, not sure if he would be a candidate for explantation if it was positive. Will discuss with EP team if feasible to get TEE without much set back  After his HD tunneled cath is placed tomorrow, would recommend to do line holiday and replace his picc line.  Acute on CKD now on HD = getting his tunneled hd catheter placed tomorrow am  Drue SecondSNIDER, [facility]NTHIA [facility] Cell: [phone] Pager: [phone]  10/22/2016, 12:27 PM

## 2016-10-22 NOTE — Progress Notes (Signed)
ANTICOAGULATION CONSULT NOTE - Follow Up Consult  Pharmacy Consult for heparin Indication: atrial fibrillation  No Known Allergies  Patient Measurements: Height: 5\' 7"  (170.2 cm) Weight: 145 lb 4.5 oz (65.9 kg) IBW/kg (Calculated) : 66.1 Heparin Dosing Weight: 67 kg  Vital Signs: Temp: 97.5 F (36.4 C) (11/27 0846) Temp Source: Oral (11/27 0846) BP: 87/59 (11/27 1100) Pulse Rate: 92 (11/27 1100)  Labs:  Recent Labs  10/20/16 0430  10/20/16 1936 10/21/16 0640 10/21/16 1614 10/22/16 0540 10/22/16 0806 10/22/16 0933  HGB 13.1  --   --  12.4*  --  12.4*  --   --   HCT 35.6*  --   --  34.5*  --  34.1*  --   --   PLT 89*  --   --  96*  --  108*  --   --   APTT 43*  --   --  149*  --   --   --  164*  LABPROT 23.9*  --   --  23.9*  --   --   --  21.8*  INR 2.10  --   --  2.10  --   --   --  1.87  HEPARINUNFRC  --   --  0.18* 0.35  --   --   --  0.48  CREATININE 1.86*  < >  --  1.84* 1.91*  --  1.57*  --   < > = values in this interval not displayed.  Estimated Creatinine Clearance: 52.5 mL/min (by C-G formula based on SCr of 1.57 mg/dL (H)).   Medications:  Prescriptions Prior to Admission  Medication Sig Dispense Refill Last Dose  . allopurinol (ZYLOPRIM) 100 MG tablet Take 100 mg by mouth daily.   06/15/16 at Unknown time  . digoxin (LANOXIN) 0.25 MG tablet Take 0.25 mg by mouth daily.    10/09/2016 at Unknown time  . potassium chloride SA (K-DUR,KLOR-CON) 20 MEQ tablet Take 2 tablets (40 mEq total) by mouth daily. 30 tablet 3 10/09/2016 at Unknown time  . spironolactone (ALDACTONE) 25 MG tablet Take 1 tablet (25 mg total) by mouth daily. 30 tablet 3 10/09/2016 at Unknown time  . torsemide (DEMADEX) 100 MG tablet Take 100 mg by mouth 2 (two) times daily.   10/09/2016 at Unknown time  . warfarin (COUMADIN) 7.5 MG tablet Take 1 tablet (7.5 mg total) by mouth daily at 6 PM. (Patient taking differently: Take 3.75-7.5 mg by mouth daily at 6 PM. Patient takes 7.5 mg everyday  except on Sunday patient takes 3.75 mg) 35 tablet 3 10/09/2016 at 1p  . amiodarone (PACERONE) 200 MG tablet Take 1 tablet (200 mg total) by mouth daily. (Patient not taking: Reported on 06/15/16) 30 tablet 3 Not Taking at Unknown time  . nicotine (NICODERM CQ - DOSED IN MG/24 HOURS) 14 mg/24hr patch Place 1 patch (14 mg total) onto the skin daily. (Patient not taking: Reported on 06/15/16) 28 patch 0 Not Taking at Unknown time    Assessment: 50 yo M admitted 06/15/16 on warfarin PTA for Afib. Patient remains on CRRT. Pharmacy consulted to dose heparin while holding warfarin. Heparin level 0.48 (therapeutic), Hgb stable 12.4, Plt low, no signs/symptoms of bleeding noted. INR trending down 1.87  Goal of Therapy:  Heparin level 0.3-0.7 units/ml Monitor platelets by anticoagulation protocol: Yes   Plan:  - Continue heparin 1650 units/hr - Monitor daily heparin level, CBC and signs/symptoms of bleeding  Sherron MondayAubrey N. Cheetara Hoge, PharmD Clinical Pharmacy Resident Pager:  [phone] 10/22/16 11:26 AM

## 2016-10-22 NOTE — Progress Notes (Signed)
Patient ID: Parker Johnson, male   DOB: 12/23/1965, 50 y.o.   MRN: [id number]    Advanced Heart Failure Rounding Note   Subjective:    Admitted with VT/syncope and profound cardiogenic shock. PICC placed with initial CO-OX 26%.   Underwent IABP and Trialysis cath placement on 11/17  On 11/19 developed sepsis. Bcx + for MSSA. Now on Ancef. F/u cultures negative.  IABP pulled 11/24. Ernestine Conrad out 11/25  Continues to feel good this morning. Denies SOB. Says he had a little urine output with BM last night otherwise no U/O.   Now on dobutamine 10 and norepi 19. IV amio switched to po. Remains in atrial tach with 2:1 HB.  Co-ox 35% -> 45% -> 60% -> 52%>57%  CVP down to 18. CVVHD now keeping even. Weight down 35 pounds.   INR pending. Heparin back on.   F/u bcx are negative.   Objective:   Weight Range:  Vital Signs:   Temp:  [97.3 F (36.3 C)-97.9 F (36.6 C)] 97.4 F (36.3 C) (11/27 0400) Pulse Rate:  [75-93] 92 (11/27 0600) Resp:  [12-33] 29 (11/27 0600) BP: (71-105)/(60-77) 98/77 (11/27 0600) SpO2:  [96 %-99 %] 96 % (11/27 0600) Arterial Line BP: (77-81)/(61-67) 77/61 (11/26 1100) Weight:  [145 lb 4.5 oz (65.9 kg)] 145 lb 4.5 oz (65.9 kg) (11/27 0400) Last BM Date: 10/20/16  Weight change: Filed Weights   10/20/16 0600 10/21/16 0500 10/22/16 0400  Weight: 150 lb 12.7 oz (68.4 kg) 149 lb 4 oz (67.7 kg) 145 lb 4.5 oz (65.9 kg)    Intake/Output:   Intake/Output Summary (Last 24 hours) at 10/22/16 0726 Last data filed at 10/22/16 0700  Gross per 24 hour  Intake          1798.69 ml  Output             3602 ml  Net         -1803.31 ml     Physical Exam: CVP 18   General:  Lying in bed. NAD HEENT: Normal Neck: supple. JVP remains elevated to ear. RIJ cordis . Carotids 2+ bilat; no bruits. No thyromegaly or nodule noted.  Cor: PMI nondisplaced. RRR. No murmurs or rubs. +S3 Lungs:clear    Abdomen: soft, NT, ND, no HSM. No bruits or masses. +BS  Extremities: no  cyanosis, clubbing, rash  RUE PICC. No edema. Ted hose present. R femoral trialysis cath.  Neuro: awake. Moves all 4 extremities w/o difficulty.   Telemetry: Reviewed, atrial tach with CHB  Labs: Basic Metabolic Panel:  Recent Labs Lab 10/17/16 0330  10/18/16 0345  10/19/16 0415 10/19/16 1623 10/20/16 0430 10/20/16 1600 10/21/16 0640 10/21/16 1614  NA 127*  < > 126*  < > 127* 128* 129* 130* 129* 121*  K 4.4  < > 4.7  < > 4.2 4.1 3.9 4.0 4.0 3.7  CL 97*  < > 98*  < > 96* 96* 95* 98* 97* 90*  CO2 17*  < > 16*  < > 18* 21* 22 21* 22 21*  GLUCOSE 126*  < > 169*  < > 174* 139* 132* 88 203* 262*  BUN 15  < > 20  < > 22* 18 16 15 14 15   CREATININE 1.84*  < > 2.28*  < > 2.30* 1.94* 1.86* 1.88* 1.84* 1.91*  CALCIUM 8.3*  < > 8.5*  < > 8.2* 8.1* 8.2* 8.3* 8.2* 7.7*  MG 1.8  --  2.5*  --  2.3  --  2.2  --  2.3  --   PHOS 2.4*  < > 2.8  < > 2.7 2.2* 2.4* 2.5 2.1* 2.4*  < > = values in this interval not displayed.  Liver Function Tests:  Recent Labs Lab 10/19/16 1623 10/20/16 0430 10/20/16 1600 10/21/16 0640 10/21/16 1614  ALBUMIN 2.7* 2.7* 2.8* 2.5* 2.5*   No results for input(s): LIPASE, AMYLASE in the last 168 hours. No results for input(s): AMMONIA in the last 168 hours.  CBC:  Recent Labs Lab 10/18/16 0345 10/19/16 0415 10/20/16 0430 10/21/16 0640 10/22/16 0540  WBC 8.3 10.9* 9.2 8.4 8.5  HGB 13.7 13.6 13.1 12.4* 12.4*  HCT 37.8* 36.9* 35.6* 34.5* 34.1*  MCV 79.4 78.2 77.9* 78.8 78.8  PLT 110* 100* 89* 96* 108*    Cardiac Enzymes: No results for input(s): CKTOTAL, CKMB, CKMBINDEX, TROPONINI in the last 168 hours.  BNP: BNP (last 3 results)  Recent Labs  05/16/16 0420 06/21/16 1218 09/28/2016 0957  BNP 2,453.6* 2,504.3* 3,542.3*    ProBNP (last 3 results) No results for input(s): PROBNP in the last 8760 hours.    Other results:  Imaging: Dg Chest 1 View  Result Date: 10/20/2016 CLINICAL DATA:  Congestive heart failure, fever EXAM: CHEST 1 VIEW  COMPARISON:  10/14/2016 FINDINGS: Significant cardiomegaly again noted. Dual lead cardiac pacemaker is unchanged in position. Stable right arm PICC line position. Right IJ sheath with tip in upper SVC. No infiltrate or pulmonary edema. Mild left basilar atelectasis. Right Swan-Ganz catheter has been removed. IMPRESSION: Cardiomegaly again noted. Dual lead cardiac pacemaker is unchanged in position. Stable right arm PICC line position. No infiltrate or pulmonary edema. Mild left basilar atelectasis. Electronically Signed   By: Natasha MeadLiviu  Pop M.D.   On: 10/20/2016 15:47     Medications:     Scheduled Medications: . amiodarone  400 mg Oral BID  .  ceFAZolin (ANCEF) IV  2 g Intravenous Q12H  . insulin aspart  0-5 Units Subcutaneous QHS  . insulin aspart  0-9 Units Subcutaneous TID WC  . rifampin  300 mg Oral Q8H  . senna-docusate  1 tablet Oral Daily  . sodium chloride flush  10-40 mL Intracatheter Q12H    Infusions: . sodium chloride 10 mL/hr at 10/21/16 1600  . sodium chloride 200 mL/hr at 10/22/16 0130  . DOBUTamine 10 mcg/kg/min (10/21/16 2030)  . heparin 1,650 Units/hr (10/21/16 2321)  . norepinephrine (LEVOPHED) Adult infusion 19 mcg/min (10/21/16 2248)  . dialysis replacement fluid (prismasate) 200 mL/hr at 10/21/16 1343  . dialysate (PRISMASATE) 1,500 mL/hr at 10/22/16 0659    PRN Medications: acetaminophen, bisacodyl, heparin, heparin, magnesium hydroxide, ondansetron (ZOFRAN) IV, sodium chloride, sodium chloride flush, traMADol   Assessment/Plan/Discussion    1. 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  NICM.  - ECHO this admission EF 10% with severe RV dilation and dysfunction. - He is down 35 pounds with CVVHD. Still anuric. - Co-ox 57%. Remains on norepi 19 mcg + dobutamine 10 mcg. Add midodrine 2/5 mg three times a day.  - CVP coming down to 18. Not sure we can get much better with severe RV dysfunction - Not candidate for  VAD or transplant so have not pursued ECMO.  (Would need heart kidney. Had ongoing tobacco use on admit and last cocaine use about 3 months ago. Blood type B+) - Main issue now is whether or not kidneys will recover and how long we should continue CVVHD. With inotrope dependence and severe  HF, not candidate for iHD. Would like to give it at least another week. Renal ok with this. Thus will plan to move CVVHD catheter to IJ in am so he can get out of bed. Make NPO. Call IR in am.  2. Acute on CKD III:  Anuric. Now on CVVHD (started 11/17).  Making a little bit of urine.  3. PAF/atrial tach:  Remains in atrial tach. 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 D/w Dr. Graciela Husbands. Will consider overdrive pacing in am. Repeat echo to look for vegetation/abscess 4. H/O LV thrombus 5. H/o polysubstance abuse (ETOH, cocaine, tobacco use) 6. Hyperkalemia: Resolved CVVH.  9. MSSA sepsis  - Now on Ancef and rifampin. With CHB will proceed with echo to look for endocarditis though f/u cx are negative. May need TEE if/when more stable 10. Syncope: VT [employer].  Device interrogation showed multiple episodes of VT. Loading amio per EP. K repleted. No further VT over the last few days. EP following --On po amio   Length of Stay: 12  Amy Clegg, NP-C  10/22/2016, 7:26 AM  Advanced Heart Failure Team Pager [phone] (M-F; 7a - 4p)  Please contact [facility] for night-coverage after hours (4p -7a ) and weekends on [email or web address]  Patient seen and examined with Tonye Becket, NP. We discussed all aspects of the encounter. I agree with the assessment and plan as stated above.   From cardiac status this is about as good as we can get him. Will continue high-dose dobutamine and add midodrine to try and wean norepi. Case d/w Dr. Darrick Penna. Will give FFP and plan to have IR place tunneled HD catheter in RIJ tomorrow as it may take weeks for renal function to improve. With inotrope dependence will not be candidate for outpatient HD. Continue heparin  for AF and h/o LV thrombus.   The patient is critically ill with multiple organ systems failure and requires high complexity decision making for assessment and support, frequent evaluation and titration of therapies, application of advanced monitoring technologies and extensive interpretation of multiple databases.   Critical Care Time devoted to patient care services described in this note is 35 Minutes.  Ajiah Mcglinn,MD 1:12 PM

## 2016-10-22 NOTE — Progress Notes (Signed)
SUBJECTIVE: The patient remains in very good spirits, tells me he feels very good, denies any SOB or any CP  . amiodarone  400 mg Oral BID  .  ceFAZolin (ANCEF) IV  2 g Intravenous Q12H  . insulin aspart  0-5 Units Subcutaneous QHS  . insulin aspart  0-9 Units Subcutaneous TID WC  . midodrine  2.5 mg Oral TID WC  . rifampin  300 mg Oral Q8H  . senna-docusate  1 tablet Oral Daily  . sodium chloride flush  10-40 mL Intracatheter Q12H  . sodium phosphate  Dextrose 5% IVPB  30 mmol Intravenous Once   . sodium chloride 10 mL/hr at 10/22/16 0800  . DOBUTamine 10 mcg/kg/min (10/22/16 0800)  . heparin 10,000 units/ 20 mL infusion syringe    . heparin 1,650 Units/hr (10/22/16 0800)  . norepinephrine (LEVOPHED) Adult infusion 18.987 mcg/min (10/22/16 0800)  . dialysis replacement fluid (prismasate)    . dialysis replacement fluid (prismasate)    . dialysate (PRISMASATE) 1,500 mL/hr at 10/22/16 1011    OBJECTIVE: Physical Exam: Vitals:   10/22/16 0800 10/22/16 0846 10/22/16 0900 10/22/16 1000  BP: 96/73  96/71 94/71  Pulse: 92  93 93  Resp: (!) 9  (!) 26 16  Temp:  97.5 F (36.4 C)    TempSrc:  Oral    SpO2: 97%  98% 97%  Weight:      Height:        Intake/Output Summary (Last 24 hours) at 10/22/16 1035 Last data filed at 10/22/16 1000  Gross per 24 hour  Intake           1809.2 ml  Output             3674 ml  Net          -1864.8 ml    Telemetry reviewed by myself, looks SR, ATach with 2:1 block, V rates 70's  GEN- The patient is ill appearing, alert and oriented x 3 today.   Head- normocephalic, atraumatic Eyes-  Sclera clear, conjunctiva pink Ears- hearing intact Oropharynx- clear Neck- supple, no JVP Lungs- Clear to ausculation bilaterally, normal work of breathing Heart- Regular rate and rhythm, no significant murmurs, no rubs or gallops GI- soft, NT, ND Extremities- no clubbing, cyanosis, or edema Skin- no rash or lesion Psych- euthymic mood, full  affect Neuro- no gross deficits appreciated  Trialysis catheter in place  LABS: Basic Metabolic Panel:  Recent Labs  78/29/5611/26/17 0640 10/21/16 1614 10/22/16 0806  NA 129* 121* 130*  K 4.0 3.7 4.0  CL 97* 90* 99*  CO2 22 21* 22  GLUCOSE 203* 262* 151*  BUN 14 15 13   CREATININE 1.84* 1.91* 1.57*  CALCIUM 8.2* 7.7* 8.5*  MG 2.3  --  2.3  PHOS 2.1* 2.4* 1.9*   Liver Function Tests:  Recent Labs  10/21/16 1614 10/22/16 0806  ALBUMIN 2.5* 2.8*   No results for input(s): LIPASE, AMYLASE in the last 72 hours. CBC:  Recent Labs  10/21/16 0640 10/22/16 0540  WBC 8.4 8.5  HGB 12.4* 12.4*  HCT 34.5* 34.1*  MCV 78.8 78.8  PLT 96* 108*    RADIOLOGY: Dg Chest Port 1 View Result Date: 10/14/2016 CLINICAL DATA:  Fever, chills. EXAM: PORTABLE CHEST 1 VIEW COMPARISON:  10/21/2016 FINDINGS: Left pacer remains in place, unchanged. Right Swan-Ganz catheter is in place with the tip in the right lower lobe pulmonary artery. There is marked cardiomegaly with vascular congestion. No overt failure. Probable left  lower lobe compressive atelectasis. No definite effusion. IMPRESSION: Swan-Ganz catheter in the central right lower lobe pulmonary artery. Marked cardiomegaly and vascular congestion. Electronically Signed   By: Charlett Nose M.D.   On: 10/14/2016 07:24     ASSESSMENT AND PLAN:   1. Septic/cardiogenic shock w/MSOF     IABP remains 1;1, augmented SBP low 100's     On Dobutamine gtt @10      levophed gtt @14   Hyponatremic up to 130 Leukocytosis down to 8.5 INR 1.87 (on heparin gtt) H/H 12/34 plts 108  Co-ox 56   2.  Bacteremia      + BC with gram + cocci, Staph      On cefazolin and rifampin      Repeat BC neg 5 days  ICD in place, BSCi implanted 2010 Will discuss need for TEE vs TTE        3. VT storm  On PO amio rhythm appears to be continued ATach with improved conduction 2:1 10/16/16 device reprogrammed to DDI 60 from DDD 40  4. ARF     Remains on CRRT      Small amount of urine yesterday reported, +/-68ml  5. PAFib, h/o LV thrombus     ATach 2:1 block, CHB over the weekend changed to PO amiodarone     V Rate 70's     Heparin gtt, INR down this AM 1.87     Continue current management with CHF and ICU team, nephrology    Francis Dowse, PA-C 10/22/2016 10:35 AM

## 2016-10-22 NOTE — Consult Note (Signed)
Chief Complaint: Patient was seen in consultation today for tunneled dialysis catheter placement Chief Complaint  Patient presents with  . Loss of Consciousness  . Irregular Heart Beat   at the request of Dr Fayrene FearingJames Deterding  Referring Physician(s): Dr Lyndee Hensenaniel Bensimohn  Supervising Physician: Gilmer MorWagner, Jaime  [facility]  History of Present Illness: Parker Johnson is a 50 y.o. male   Vtach/syncope Cardiogenic shock Sepsis and +BC 11/19 Oliguric AKI/CKD secondary cardiogenic shock Temp Rt fem dialysis catheter placed  Now need for perm cath per Dr Deterding Renal recovery slow to none Removal of Rt femoral catheter and plan for Johnson soon  [facility]   Past Medical History:  Diagnosis Date  . AICD (automatic cardioverter/defibrillator) present    [employer] Teligen 100  . Alcohol abuse    hx  . Cardiomyopathy    secondary  . CHF (congestive heart failure) (HCC)   . Chronic anticoagulation    Coumadin for hx LV thrombus and PAF  . Gout   . HTN (hypertension)    uncontrolled  . Tobacco abuse   . Ventricular tachycardia [facility](HCC)     Past Surgical History:  Procedure Laterality Date  . CARDIAC CATHETERIZATION N/A 10/03/2016   Procedure: Right Heart Cath;  Surgeon: Dolores Pattyaniel R Bensimhon, MD;  [facility];  Service: Cardiovascular;  Laterality: N/A;  . CARDIAC CATHETERIZATION N/A 10/18/2016   Procedure: IABP Insertion;  Surgeon: Dolores Pattyaniel R Bensimhon, MD;  Location: [facility] INVASIVE CV LAB;  Service: Cardiovascular;  Laterality: N/A;  . CARDIAC CATHETERIZATION N/A 09/27/2016   Procedure: Arterial Line Insertion;  Surgeon: Dolores Pattyaniel R Bensimhon, MD;  Location: [facility] INVASIVE CV LAB;  Service: Cardiovascular;  Laterality: N/A;  . CARDIAC DEFIBRILLATOR PLACEMENT  2010   [employer] tleigen 100  . EMBOLECTOMY Right 06/04/2013   Procedure: Upper Extremity Thrombectomy;  Surgeon: Nada LibmanVance W Brabham, MD;  [facility];  Service: Vascular;   Laterality: Right;  . INSERTION OF DIALYSIS CATHETER  10/02/2016   Procedure: Insertion Of Dialysis Catheter;  Surgeon: Dolores Pattyaniel R Bensimhon, MD;  [facility];  Service: Cardiovascular;;  right femoral    Allergies: Patient has no known allergies.  Medications: Prior to Admission medications   Medication Sig Start Date End Date Taking? Authorizing Provider  allopurinol (ZYLOPRIM) 100 MG tablet Take 100 mg by mouth daily.   Yes Historical Provider, MD  digoxin (LANOXIN) 0.25 MG tablet Take 0.25 mg by mouth daily.    Yes Historical Provider, MD  potassium chloride SA (K-DUR,KLOR-CON) 20 MEQ tablet Take 2 tablets (40 mEq total) by mouth daily. 05/18/16  Yes Rinaldo CloudMohan Harwani, MD  spironolactone (ALDACTONE) 25 MG tablet Take 1 tablet (25 mg total) by mouth daily. 05/18/16  Yes Rinaldo CloudMohan Harwani, MD  torsemide (DEMADEX) 100 MG tablet Take 100 mg by mouth 2 (two) times daily.   Yes Historical Provider, MD  warfarin (COUMADIN) 7.5 MG tablet Take 1 tablet (7.5 mg total) by mouth daily at 6 PM. Patient taking differently: Take 3.75-7.5 mg by mouth daily at 6 PM. Patient takes 7.5 mg everyday except on Sunday patient takes 3.75 mg 05/18/16  Yes Rinaldo CloudMohan Harwani, MD  amiodarone (PACERONE) 200 MG tablet Take 1 tablet (200 mg total) by mouth daily. Patient not taking: Reported on 2016/06/14 02/28/16   Duke SalviaSteven C Klein, MD  nicotine (NICODERM CQ - DOSED IN MG/24 HOURS) 14 mg/24hr patch Place 1 patch (14 mg total) onto the skin daily. Patient not  taking: Reported on 09/27/2016 01/01/16   Rinaldo Cloud, MD     Family History  Problem Relation Age of Onset  . Cardiomyopathy Father     on a defib  . Diabetes Father     Social History   Social History  . Marital status: Married    Spouse name: N/A  . Number of children: N/A  . Years of education: N/A   Occupational History  . Disabled    Social History Main Topics  . Smoking status: Current Some Day Smoker    Packs/day: 0.50    Years: 30.00     Types: Cigarettes  . Smokeless tobacco: Never Used     Comment: I already have information on quitting  . Alcohol use 0.6 oz/week    1 Cans of beer per week     Comment: None in 3 months  . Drug use: No     Comment: history of cocaine use that is not very recent (1-2 yrs)  . Sexual activity: Yes    Birth control/ protection: None   Other Topics Concern  . None   Social History Narrative   Disabled. Did not finish the 12th grade but is interested in pursuing a GED.       Review of Systems: A 12 point ROS discussed and pertinent positives are indicated in the HPI above.  All other systems are negative.  Review of Systems  Constitutional: Positive for activity change, appetite change and fatigue. Negative for fever.  Respiratory: Positive for shortness of breath.   Gastrointestinal: Negative for abdominal pain.  Genitourinary: Positive for decreased urine volume.  Neurological: Positive for weakness.  Psychiatric/Behavioral: Negative for behavioral problems and confusion.    Vital Signs: BP 94/71   Pulse 93   Temp 97.5 F (36.4 C) (Oral)   Resp 16   Ht 5\' 7"  (1.702 m)   Wt 145 lb 4.5 oz (65.9 kg)   SpO2 97%   BMI 22.75 kg/m   Physical Exam  Constitutional: He is oriented to person, place, and time.  Cardiovascular: Normal rate and regular rhythm.   Pulmonary/Chest: Effort normal. He has wheezes.  Abdominal: Soft. Bowel sounds are normal.  Musculoskeletal: Normal range of motion.  Right femoral temp dialysis catheter in place; in use  Neurological: He is alert and oriented to person, place, and time.  Skin: Skin is warm and dry.  Psychiatric: He has a normal mood and affect. His behavior is normal. Judgment and thought content normal.  Nursing note and vitals reviewed.   Mallampati Score:  MD Evaluation Airway: WNL Heart: Other (comments) Heart  comments: EF 10%. Cardiogenic shock Abdomen: WNL Chest/ Lungs: WNL Other Pertinent Findings: Patient in  cardiogenic shock with severe mulit-system organ failure. Brought for emergent shock treatment ASA  Classification: 4 Mallampati/Airway Score: Two  Imaging: Dg Chest 1 View  Result Date: 10/20/2016 CLINICAL DATA:  Congestive heart failure, fever EXAM: CHEST 1 VIEW COMPARISON:  10/14/2016 FINDINGS: Significant cardiomegaly again noted. Dual lead cardiac pacemaker is unchanged in position. Stable right arm PICC line position. Right IJ sheath with tip in upper SVC. No infiltrate or pulmonary edema. Mild left basilar atelectasis. Right Swan-Ganz catheter has been removed. IMPRESSION: Cardiomegaly again noted. Dual lead cardiac pacemaker is unchanged in position. Stable right arm PICC line position. No infiltrate or pulmonary edema. Mild left basilar atelectasis. Electronically Signed   By: Natasha Mead M.D.   On: 10/20/2016 15:47   Ct Head Wo Contrast  Result Date: 09/28/2016  CLINICAL DATA:  Syncope. EXAM: CT HEAD WITHOUT CONTRAST TECHNIQUE: Contiguous axial images were obtained from the base of the skull through the vertex without intravenous contrast. COMPARISON:  CT scan of July 03, 2014. 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 No mass effect or midline shift is noted. Ventricular size is within normal limits. There is no evidence of mass lesion, hemorrhage or acute infarction. Vascular: Atherosclerosis of carotid siphons is noted. Skull: Bony calvarium is unremarkable. Sinuses/Orbits: Visualized paranasal sinuses appear normal. Other: None. IMPRESSION: Stable old infarctions as described above. No acute intracranial abnormality seen. Electronically Signed   By: Lupita Raider, M.D.   On: 10/06/2016 11:54   Dg Chest Port 1 View  Result Date: 10/14/2016 CLINICAL DATA:  Fever, chills. EXAM: PORTABLE CHEST 1 VIEW COMPARISON:  10/15/2016 FINDINGS: Left pacer remains in place, unchanged. Right Swan-Ganz catheter is in place with the  tip in the right lower lobe pulmonary artery. There is marked cardiomegaly with vascular congestion. No overt failure. Probable left lower lobe compressive atelectasis. No definite effusion. IMPRESSION: Swan-Ganz catheter in the central right lower lobe pulmonary artery. Marked cardiomegaly and vascular congestion. Electronically Signed   By: Charlett Nose M.D.   On: 10/14/2016 07:24   Dg Chest Port 1 View  Result Date: 10/19/2016 CLINICAL DATA:  PICC line placement EXAM: PORTABLE CHEST 1 VIEW COMPARISON:  10/15/2016 at 10:29 FINDINGS: There is a new right upper extremity PICC line extending into the low SVC. There is marked unchanged cardiomegaly. No airspace consolidation. No large effusion. No pneumothorax. IMPRESSION: Satisfactorily positioned right upper extremity PICC line. No other significant interval changes. Electronically Signed   By: Ellery Plunk M.D.   On: 10/11/2016 21:01   Dg Chest Port 1 View  Result Date: 10/13/2016 CLINICAL DATA:  Syncope today, cough for 1 week EXAM: PORTABLE CHEST 1 VIEW COMPARISON:  05/13/2016 FINDINGS: There is mild bilateral interstitial thickening. There is no focal parenchymal opacity. There is no pleural effusion or pneumothorax. There is stable cardiomegaly. There is a dual lead AICD. The osseous structures are unremarkable. IMPRESSION: Cardiomegaly with mild pulmonary vascular congestion. Electronically Signed   By: Elige Ko   On: 09/27/2016 10:38    Labs:  CBC:  Recent Labs  10/19/16 0415 10/20/16 0430 10/21/16 0640 10/22/16 0540  WBC 10.9* 9.2 8.4 8.5  HGB 13.6 13.1 12.4* 12.4*  HCT 36.9* 35.6* 34.5* 34.1*  PLT 100* 89* 96* 108*    COAGS:  Recent Labs  10/19/16 0415 10/20/16 0430 10/21/16 0640 10/22/16 0933  INR 2.10 2.10 2.10 PENDING  APTT  --  43* 149*  --     BMP:  Recent Labs  10/20/16 1600 10/21/16 0640 10/21/16 1614 10/22/16 0806  NA 130* 129* 121* 130*  K 4.0 4.0 3.7 4.0  CL 98* 97* 90* 99*  CO2 21* 22  21* 22  GLUCOSE 88 203* 262* 151*  BUN 15 14 15 13   CALCIUM 8.3* 8.2* 7.7* 8.5*  CREATININE 1.88* 1.84* 1.91* 1.57*  GFRNONAA 40* 41* 39* 50*  GFRAA 46* 48* 46* 58*    LIVER FUNCTION TESTS:  Recent Labs  05/15/16 0408 10/02/2016 0957 10/11/16 0422 10/19/2016 0500  10/20/16 1600 10/21/16 0640 10/21/16 1614 10/22/16 0806  BILITOT 3.1* 2.8* 2.8* 2.5*  --   --   --   --   --   AST 24 31 62* 287*  --   --   --   --   --  ALT 18 16* 39 184*  --   --   --   --   --   ALKPHOS 82 88 86 82  --   --   --   --   --   PROT 6.8 7.5 6.8 7.4  --   --   --   --   --   ALBUMIN 2.9* 3.3* 3.1* 3.3*  < > 2.8* 2.5* 2.5* 2.8*  < > = values in this interval not displayed.  TUMOR MARKERS: No results for input(s): AFPTM, CEA, CA199, CHROMGRNA in the last 8760 hours.  Assessment and Plan:  Cardiogenic shock; Vtach Acute kidney injury requiring Rt fem temp dialysis catheter placement and dialysis Renal recovery slow to none Dr Deterding requesting tunneled dialysis catheter [facility] Neg Plan for tunneled dialysis catheter today or tomorrow. Risks and Benefits discussed with the patient including, but not limited to bleeding, infection, vascular injury, pneumothorax which may require chest tube placement, air embolism or even death All of the patient's questions were answered, patient is agreeable to proceed. Consent signed and in chart.   Thank you for this interesting consult.  I greatly enjoyed meeting ALLARD LIGHTSEY and look forward to participating in their care.  A copy of this report was sent to the requesting provider on this date.  Electronically Signed: Karine Garn A 10/22/2016, 10:22 AM   I spent a total of 20 minutes    in face to face in clinical consultation, greater than 50% of which was counseling/coordinating care for tunneled dialysis catheter placement

## 2016-10-22 NOTE — Progress Notes (Signed)
Subjective: Interval History: has no complaint .  Objective: Vital signs in last 24 hours: Temp:  [97.3 F (36.3 C)-97.9 F (36.6 C)] 97.5 F (36.4 C) (11/27 0846) Pulse Rate:  [75-93] 93 (11/27 0900) Resp:  [9-33] 26 (11/27 0900) BP: (71-105)/(60-77) 96/71 (11/27 0900) SpO2:  [96 %-99 %] 98 % (11/27 0900) Arterial Line BP: (77-81)/(61-65) 77/61 (11/26 1100) Weight:  [65.9 kg (145 lb 4.5 oz)] 65.9 kg (145 lb 4.5 oz) (11/27 0400) Weight change: -1.8 kg (-3 lb 15.5 oz)  Intake/Output from previous day: 11/26 0701 - 11/27 0700 In: 1798.7 [P.O.:480; I.V.:1168.7; IV Piggyback:150] Out: 3727 [Urine:50] Intake/Output this shift: Total I/O In: 121.6 [P.O.:30; I.V.:91.6] Out: 275 [Other:275]  General appearance: alert, cooperative and no distress Resp: clear to auscultation bilaterally Cardio: S1, S2 normal and systolic murmur: holosystolic 2/6, blowing at apex GI: liver down 7 cm, firm, pos bs Extremities: extremities normal, atraumatic, no cyanosis or edema  Lab Results:  Recent Labs  10/21/16 0640 10/22/16 0540  WBC 8.4 8.5  HGB 12.4* 12.4*  HCT 34.5* 34.1*  PLT 96* 108*   BMET:  Recent Labs  10/21/16 1614 10/22/16 0806  NA 121* 130*  K 3.7 4.0  CL 90* 99*  CO2 21* 22  GLUCOSE 262* 151*  BUN 15 13  CREATININE 1.91* 1.57*  CALCIUM 7.7* 8.5*   No results for input(s): PTH in the last 72 hours. Iron Studies: No results for input(s): IRON, TIBC, TRANSFERRIN, FERRITIN in the last 72 hours.  Studies/Results: Dg Chest 1 View  Result Date: 10/20/2016 CLINICAL DATA:  Congestive heart failure, fever EXAM: CHEST 1 VIEW COMPARISON:  10/14/2016 FINDINGS: Significant cardiomegaly again noted. Dual lead cardiac pacemaker is unchanged in position. Stable right arm PICC line position. Right IJ sheath with tip in upper SVC. No infiltrate or pulmonary edema. Mild left basilar atelectasis. Right Swan-Ganz catheter has been removed. IMPRESSION: Cardiomegaly again noted. Dual lead  cardiac pacemaker is unchanged in position. Stable right arm PICC line position. No infiltrate or pulmonary edema. Mild left basilar atelectasis. Electronically Signed   By: Natasha Mead M.D.   On: 10/20/2016 15:47    I have reviewed the patient's current medications.  Assessment/Plan: 1 AKI oliguric ATN.  Not good acid/base or free water clearance. Will change CRRT. Needs PC 2 CM need goals of care 3 low bp, keep even, wean NE 4 Hx substance abuse P CRRT change orders,  Lower NE, get PC    LOS: 12 days   Parker Johnson,Parker Johnson 10/22/2016,9:20 AM

## 2016-10-23 LAB — CBC
HCT: 33 % — ABNORMAL LOW (ref 39.0–52.0)
HEMOGLOBIN: 12 g/dL — AB (ref 13.0–17.0)
MCH: 28 pg (ref 26.0–34.0)
MCHC: 36.4 g/dL — ABNORMAL HIGH (ref 30.0–36.0)
MCV: 77.1 fL — AB (ref 78.0–100.0)
Platelets: 124 10*3/uL — ABNORMAL LOW (ref 150–400)
RBC: 4.28 MIL/uL (ref 4.22–5.81)
RDW: 20.7 % — ABNORMAL HIGH (ref 11.5–15.5)
WBC: 9.5 10*3/uL (ref 4.0–10.5)

## 2016-10-23 LAB — COOXEMETRY PANEL
Carboxyhemoglobin: 2.9 % — ABNORMAL HIGH (ref 0.5–1.5)
Methemoglobin: 1.1 % (ref 0.0–1.5)
O2 Saturation: 60.7 %
Total hemoglobin: 12 g/dL (ref 12.0–16.0)

## 2016-10-23 LAB — RENAL FUNCTION PANEL
ALBUMIN: 3 g/dL — AB (ref 3.5–5.0)
Albumin: 2.7 g/dL — ABNORMAL LOW (ref 3.5–5.0)
Anion gap: 9 (ref 5–15)
Anion gap: 11 (ref 5–15)
BUN: 12 mg/dL (ref 6–20)
BUN: 8 mg/dL (ref 6–20)
CALCIUM: 8.7 mg/dL — AB (ref 8.9–10.3)
CALCIUM: 8.7 mg/dL — AB (ref 8.9–10.3)
CO2: 25 mmol/L (ref 22–32)
CO2: 23 mmol/L (ref 22–32)
CREATININE: 1.47 mg/dL — AB (ref 0.61–1.24)
Chloride: 97 mmol/L — ABNORMAL LOW (ref 101–111)
Chloride: 100 mmol/L — ABNORMAL LOW (ref 101–111)
Creatinine, Ser: 0.91 mg/dL (ref 0.61–1.24)
GFR calc Af Amer: >60 mL/min (ref >60)
GFR calc non Af Amer: >60 mL/min (ref >60)
GFR, EST NON AFRICAN AMERICAN: 54 mL/min — AB (ref >60)
GLUCOSE: 120 mg/dL — AB (ref 65–99)
Glucose, Bld: 121 mg/dL — ABNORMAL HIGH (ref 65–99)
PHOSPHORUS: 2.7 mg/dL (ref 2.5–4.6)
POTASSIUM: 4.2 mmol/L (ref 3.5–5.1)
Phosphorus: 2.4 mg/dL — ABNORMAL LOW (ref 2.5–4.6)
Potassium: 4.2 mmol/L (ref 3.5–5.1)
SODIUM: 131 mmol/L — AB (ref 135–145)
SODIUM: 134 mmol/L — AB (ref 135–145)

## 2016-10-23 LAB — PROTIME-INR
INR: 1.91
INR: 2.01
Prothrombin Time: 22.2 seconds — ABNORMAL HIGH (ref 11.4–15.2)
Prothrombin Time: 23.1 seconds — ABNORMAL HIGH (ref 11.4–15.2)

## 2016-10-23 LAB — PREPARE FRESH FROZEN PLASMA
UNIT DIVISION: 0
Unit division: 0

## 2016-10-23 LAB — HEPATIC FUNCTION PANEL
ALBUMIN: 3 g/dL — AB (ref 3.5–5.0)
ALT: 7 U/L — ABNORMAL LOW (ref 17–63)
AST: 27 U/L (ref 15–41)
Alkaline Phosphatase: 88 U/L (ref 38–126)
BILIRUBIN TOTAL: 21.6 mg/dL — AB (ref 0.3–1.2)
Bilirubin, Direct: 13.5 mg/dL — ABNORMAL HIGH (ref 0.1–0.5)
Indirect Bilirubin: 8.1 mg/dL — ABNORMAL HIGH (ref 0.3–0.9)
TOTAL PROTEIN: 8.3 g/dL — AB (ref 6.5–8.1)

## 2016-10-23 LAB — GLUCOSE, CAPILLARY
GLUCOSE-CAPILLARY: 110 mg/dL — AB (ref 65–99)
GLUCOSE-CAPILLARY: 101 mg/dL — AB (ref 65–99)
GLUCOSE-CAPILLARY: 88 mg/dL (ref 65–99)
Glucose-Capillary: 78 mg/dL (ref 65–99)
Glucose-Capillary: 199 mg/dL — ABNORMAL HIGH (ref 65–99)

## 2016-10-23 LAB — HEPARIN LEVEL (UNFRACTIONATED): Heparin Unfractionated: 0.48 IU/mL (ref 0.30–0.70)

## 2016-10-23 LAB — APTT: APTT: 142 s — AB (ref 24–36)

## 2016-10-23 LAB — MAGNESIUM: Magnesium: 2.4 mg/dL (ref 1.7–2.4)

## 2016-10-23 MED ORDER — MIDODRINE HCL 5 MG PO TABS
5.0000 mg | ORAL_TABLET | Freq: Three times a day (TID) | ORAL | Status: DC
  Administered 2016-10-23: 5 mg via ORAL
  Filled 2016-10-23: qty 1

## 2016-10-23 MED ORDER — MIDODRINE HCL 5 MG PO TABS
10.0000 mg | ORAL_TABLET | Freq: Three times a day (TID) | ORAL | Status: DC
  Administered 2016-10-23 – 2016-10-30 (×20): 10 mg via ORAL
  Filled 2016-10-23 (×19): qty 2

## 2016-10-23 MED ORDER — SODIUM PHOSPHATES 45 MMOLE/15ML IV SOLN
30.0000 mmol | Freq: Once | INTRAVENOUS | Status: AC
  Administered 2016-10-23: 30 mmol via INTRAVENOUS
  Filled 2016-10-23: qty 10

## 2016-10-23 NOTE — Progress Notes (Signed)
Subjective: Interval History: awaiting INR lowering to get PC  Objective: Vital signs in last 24 hours: Temp:  [97.3 F (36.3 C)-97.9 F (36.6 C)] 97.4 F (36.3 C) (11/28 0825) Pulse Rate:  [79-95] 95 (11/28 0800) Resp:  [0-31] 7 (11/28 0800) BP: (81-105)/(59-78) 98/77 (11/28 0800) SpO2:  [88 %-100 %] 98 % (11/28 0800) Weight:  [67 kg (147 lb 11.3 oz)] 67 kg (147 lb 11.3 oz) (11/28 0437) Weight change: 1.1 kg (2 lb 6.8 oz)  Intake/Output from previous day: 11/27 0701 - 11/28 0700 In: 2670.2 [P.O.:660; I.V.:1099.2; Blood:453; IV Piggyback:458] Out: 3196  Intake/Output this shift: Total I/O In: 91.6 [I.V.:91.6] Out: 112 [Other:112]  General appearance: alert, cooperative and no distress Neck: IJ cath Chest wall: few crackles in bases Cardio: S1, S2 normal, systolic murmur: holosystolic 2/6, blowing at apex and LV lift,diffuse GI: soft, non-tender; bowel sounds normal; no masses,  no organomegaly and pos bs, liver down 7 cm Extremities: edema 2-3+  Lab Results:  Recent Labs  10/22/16 0540 10/23/16 0430  WBC 8.5 9.5  HGB 12.4* 12.0*  HCT 34.1* 33.0*  PLT 108* 124*   BMET:  Recent Labs  10/22/16 1526 10/23/16 0430  NA 126* 131*  K 4.7 4.2  CL 95* 97*  CO2 23 25  GLUCOSE 354* 121*  BUN 15 12  CREATININE 1.75* 1.47*  CALCIUM 8.0* 8.7*   No results for input(s): PTH in the last 72 hours. Iron Studies: No results for input(s): IRON, TIBC, TRANSFERRIN, FERRITIN in the last 72 hours.  Studies/Results: No results found.  I have reviewed the patient's current medications.  Assessment/Plan: 1 AKI this is a hemodynamic injury and kidneys should get better if heart would.   Vol xs mild, severe RHF so trying tokeep ^ CVP.Solute/acid/base/K ok. Needs phos 2 Severe CM on DB and NE,  Will try to lower NE 3 Staph sepsis giving Ancef 4 Hx substance abuse P Mido, wean NE, get PC,     LOS: 13 days   Dickson Kostelnik,Orva L 10/23/2016,9:33 AM

## 2016-10-23 NOTE — Progress Notes (Signed)
Pharmacy Antibiotic Note  Parker Johnson is a 50 y.o. male on cefazolin per pharmacy since 11/19 for MSSA bacteremia. Rifampin started per ID on 11/22 since pt has an ICD in place, and will likely need a TEE done. Of note, pt has been on CRRT since 11/17. Repeat blood cultures show no growth, WBC wnl, pt is afebrile.  Plan: -Continue cefazolin 2gm IV q12h -Continue rifampin 300mg  q8h per ID -Watch renal progress closely and dose adjust as needed  Height: 5\' 7"  (170.2 cm) Weight: 147 lb 11.3 oz (67 kg) IBW/kg (Calculated) : 66.1  Temp (24hrs), Avg:97.5 F (36.4 C), Min:97.3 F (36.3 C), Max:97.9 F (36.6 C)   Recent Labs Lab 10/16/16 1200  10/19/16 0415  10/20/16 0430  10/21/16 0640 10/21/16 1614 10/22/16 0540 10/22/16 0806 10/22/16 1526 10/23/16 0430  WBC  --   < > 10.9*  --  9.2  --  8.4  --  8.5  --   --  9.5  CREATININE  --   < > 2.30*  < > 1.86*  < > 1.84* 1.91*  --  1.57* 1.75* 1.47*  LATICACIDVEN 2.1*  --   --   --   --   --   --   --   --   --   --   --   < > = values in this interval not displayed.  Estimated Creatinine Clearance: 56.2 mL/min (by C-G formula based on SCr of 1.47 mg/dL (H)).    No Known Allergies  Antimicrobials this admission:  Vanc 11/19>11/19 Ceftaz11/19>11/19 Cefazolin 11/19>> Rifampin 11/22>   Dose adjustments this admission:  none   Microbiology results:  11/21 BCx: ngtd 11/19 BCx: 2/2 MSSA (BCID confirms) 11/15 MRSA PCR: neg   Thank you for allowing pharmacy to be a part of this patient's care.  Fredonia Highland, PharmD PGY-1 Pharmacy Resident Pager: [phone] 10/23/2016

## 2016-10-23 NOTE — Progress Notes (Addendum)
ANTICOAGULATION CONSULT NOTE - Follow Up Consult  Pharmacy Consult for heparin Indication: atrial fibrillation  No Known Allergies  Patient Measurements: Height: 5\' 7"  (170.2 cm) Weight: 147 lb 11.3 oz (67 kg) IBW/kg (Calculated) : 66.1 Heparin Dosing Weight: 67 kg  Vital Signs: Temp: 97.3 F (36.3 C) (11/28 0437) Temp Source: Oral (11/28 0437) BP: 102/74 (11/28 0600) Pulse Rate: 91 (11/28 0600)  Labs:  Recent Labs  10/21/16 0640  10/22/16 0540 10/22/16 0806 10/22/16 0933 10/22/16 1526 10/23/16 0429 10/23/16 0430  HGB 12.4*  --  12.4*  --   --   --   --  12.0*  HCT 34.5*  --  34.1*  --   --   --   --  33.0*  PLT 96*  --  108*  --   --   --   --  124*  APTT 149*  --   --   --  164*  --  142*  --   LABPROT 23.9*  --   --   --  21.8*  --  22.2*  --   INR 2.10  --   --   --  1.87  --  1.91  --   HEPARINUNFRC 0.35  --   --   --  0.48  --  0.48  --   CREATININE 1.84*  < >  --  1.57*  --  1.75*  --  1.47*  < > = values in this interval not displayed.  Estimated Creatinine Clearance: 56.2 mL/min (by C-G formula based on SCr of 1.47 mg/dL (H)).   Medications:  Prescriptions Prior to Admission  Medication Sig Dispense Refill Last Dose  . allopurinol (ZYLOPRIM) 100 MG tablet Take 100 mg by mouth daily.   08/09/16 at Unknown time  . digoxin (LANOXIN) 0.25 MG tablet Take 0.25 mg by mouth daily.    10/09/2016 at Unknown time  . potassium chloride SA (K-DUR,KLOR-CON) 20 MEQ tablet Take 2 tablets (40 mEq total) by mouth daily. 30 tablet 3 10/09/2016 at Unknown time  . spironolactone (ALDACTONE) 25 MG tablet Take 1 tablet (25 mg total) by mouth daily. 30 tablet 3 10/09/2016 at Unknown time  . torsemide (DEMADEX) 100 MG tablet Take 100 mg by mouth 2 (two) times daily.   10/09/2016 at Unknown time  . warfarin (COUMADIN) 7.5 MG tablet Take 1 tablet (7.5 mg total) by mouth daily at 6 PM. (Patient taking differently: Take 3.75-7.5 mg by mouth daily at 6 PM. Patient takes 7.5 mg  everyday except on Sunday patient takes 3.75 mg) 35 tablet 3 10/09/2016 at 1p  . amiodarone (PACERONE) 200 MG tablet Take 1 tablet (200 mg total) by mouth daily. (Patient not taking: Reported on 08/09/16) 30 tablet 3 Not Taking at Unknown time  . nicotine (NICODERM CQ - DOSED IN MG/24 HOURS) 14 mg/24hr patch Place 1 patch (14 mg total) onto the skin daily. (Patient not taking: Reported on 08/09/16) 28 patch 0 Not Taking at Unknown time    Assessment: 50 yo M admitted 08/09/16 on warfarin PTA for Afib. Pharmacy consulted to dose heparin while holding warfarin. Heparin level therapeutic this morning at 0.48, Hgb stable at 12, Plt low but improving at 124, no S/Sx of bleeding noted. INR this morning 1.91.  Goal of Therapy:  Heparin level 0.3-0.7 units/ml Monitor platelets by anticoagulation protocol: Yes    Plan:  -Continue heparin 1650 units/hr -Monitor daily heparin level, CBC and signs/symptoms of bleeding  ADDENDUM: Evening nurse and patient  reported small amounts of bright red blood in stool this morning while on rounds. MD aware, likely due to straining. Pt also has a history of hemorrhoids. Continue heparin drip with the same targets for now.  Plan: -Continue heparin 1650 units/hr -Monitor daily heparin level, CBC, and S/Sx bleeding closely  Fredonia Highland, PharmD PGY-1 Pharmacy Resident Pager: [phone] 10/23/2016

## 2016-10-23 NOTE — Progress Notes (Signed)
Patient ID: Parker Johnson, male   DOB: [birth year], 50 y.o.   MRN: [id number]    Advanced Heart Failure Rounding Note   Subjective:    Admitted with VT/syncope and profound cardiogenic shock. PICC placed with initial CO-OX 26%.   Underwent IABP and Trialysis cath placement on 11/17  On 11/19 developed sepsis. Bcx + for MSSA. Now on Ancef. F/u cultures negative.  IABP pulled 11/24. Ernestine ConradSwan out 11/25  Continues to feel good this morning. Denies SOB. Says he had a little urine output with BM last night otherwise no U/O.   Now on dobutamine 10 and norepi 19. IV amio switched to po. Remains in ?atrial tach with 2:1 HB.  Co-ox 61%  CVP down to 21. CVVHD now keeping even. Weight down 33 pounds. Over night he had a small amount of blood in his stool. Denies abdominal pain. Denies SOB/CP.   INR 1.9. Heparin back on.   F/u bcx are negative.   Objective:   Weight Range:  Vital Signs:   Temp:  [97.3 F (36.3 C)-97.9 F (36.6 C)] 97.3 F (36.3 C) (11/28 0437) Pulse Rate:  [79-95] 95 (11/28 0700) Resp:  [0-31] 0 (11/28 0700) BP: (81-105)/(59-78) 94/75 (11/28 0700) SpO2:  [88 %-100 %] 96 % (11/28 0700) Weight:  [147 lb 11.3 oz (67 kg)] 147 lb 11.3 oz (67 kg) (11/28 0437) Last BM Date: 10/22/16  Weight change: Filed Weights   10/21/16 0500 10/22/16 0400 10/23/16 0437  Weight: 149 lb 4 oz (67.7 kg) 145 lb 4.5 oz (65.9 kg) 147 lb 11.3 oz (67 kg)    Intake/Output:   Intake/Output Summary (Last 24 hours) at 10/23/16 0713 Last data filed at 10/23/16 0700  Gross per 24 hour  Intake           2670.2 ml  Output             3196 ml  Net           -525.8 ml     Physical Exam: CVP 21  General:  Lying in bed. NAD HEENT: Normal Neck: supple. JVP remains elevated to ear. RIJ cordis . Carotids 2+ bilat; no bruits. No thyromegaly or nodule noted.  Cor: PMI nondisplaced. RRR. No murmurs or rubs. +S3 Lungs:clear    Abdomen: soft, NT, ND, no HSM. No bruits or masses. +BS  Extremities: no  cyanosis, clubbing, rash  RUE PICC. No edema. Ted hose present. R femoral trialysis cath.  Neuro: awake. Moves all 4 extremities w/o difficulty.   Telemetry: Reviewed, atrial tach with CHB  Labs: Basic Metabolic Panel:  Recent Labs Lab 10/19/16 0415  10/20/16 0430  10/21/16 0640 10/21/16 1614 10/22/16 0806 10/22/16 1526 10/23/16 0430  NA 127*  < > 129*  < > 129* 121* 130* 126* 131*  K 4.2  < > 3.9  < > 4.0 3.7 4.0 4.7 4.2  CL 96*  < > 95*  < > 97* 90* 99* 95* 97*  CO2 18*  < > 22  < > 22 21* 22 23 25   GLUCOSE 174*  < > 132*  < > 203* 262* 151* 354* 121*  BUN 22*  < > 16  < > 14 15 13 15 12   CREATININE 2.30*  < > 1.86*  < > 1.84* 1.91* 1.57* 1.75* 1.47*  CALCIUM 8.2*  < > 8.2*  < > 8.2* 7.7* 8.5* 8.0* 8.7*  MG 2.3  --  2.2  --  2.3  --  2.3  --  2.4  PHOS 2.7  < > 2.4*  < > 2.1* 2.4* 1.9* 4.1 2.4*  < > = values in this interval not displayed.  Liver Function Tests:  Recent Labs Lab 10/21/16 0640 10/21/16 1614 10/22/16 0806 10/22/16 1526 10/23/16 0430  ALBUMIN 2.5* 2.5* 2.8* 2.4* 2.7*   No results for input(s): LIPASE, AMYLASE in the last 168 hours. No results for input(s): AMMONIA in the last 168 hours.  CBC:  Recent Labs Lab 10/19/16 0415 10/20/16 0430 10/21/16 0640 10/22/16 0540 10/23/16 0430  WBC 10.9* 9.2 8.4 8.5 9.5  HGB 13.6 13.1 12.4* 12.4* 12.0*  HCT 36.9* 35.6* 34.5* 34.1* 33.0*  MCV 78.2 77.9* 78.8 78.8 77.1*  PLT 100* 89* 96* 108* 124*    Cardiac Enzymes: No results for input(s): CKTOTAL, CKMB, CKMBINDEX, TROPONINI in the last 168 hours.  BNP: BNP (last 3 results)  Recent Labs  05/16/16 0420 06/21/16 1218 10/05/2016 0957  BNP 2,453.6* 2,504.3* 3,542.3*    ProBNP (last 3 results) No results for input(s): PROBNP in the last 8760 hours.    Other results:  Imaging: No results found.   Medications:     Scheduled Medications: . amiodarone  400 mg Oral BID  .  ceFAZolin (ANCEF) IV  2 g Intravenous Q12H  . insulin aspart  0-5  Units Subcutaneous QHS  . insulin aspart  0-9 Units Subcutaneous TID WC  . midodrine  2.5 mg Oral TID WC  . rifampin  300 mg Oral Q8H  . senna-docusate  1 tablet Oral Daily  . sodium chloride flush  10-40 mL Intracatheter Q12H    Infusions: . sodium chloride 10 mL/hr at 10/22/16 0800  . DOBUTamine 10 mcg/kg/min (10/22/16 1837)  . heparin 1,650 Units/hr (10/22/16 1555)  . norepinephrine (LEVOPHED) Adult infusion 19 mcg/min (10/22/16 1556)  . dialysis replacement fluid (prismasate) 700 mL/hr at 10/23/16 0100  . dialysis replacement fluid (prismasate) 700 mL/hr at 10/23/16 0100  . dialysate (PRISMASATE) 1,500 mL/hr at 10/23/16 0100    PRN Medications: acetaminophen, bisacodyl, heparin, heparin, magnesium hydroxide, ondansetron (ZOFRAN) IV, sodium chloride, sodium chloride, sodium chloride flush, traMADol   Assessment/Plan/Discussion    1. 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  NICM.  - ECHO this admission EF 10% with severe RV dilation and dysfunction. - He is down 33 pounds with CVVHD. Still anuric. - Co-ox 61%. Remains on norepi 19 mcg + dobutamine 10 mcg. Increase midodrine 10 mg three times a day.  - CVP coming down to 18. Not sure we can get much better with severe RV dysfunction - Not candidate for VAD or transplant so have not pursued ECMO (Would need heart kidney. Had ongoing tobacco use on admit and last cocaine use about 3 months ago. Blood type B+). - Main issue now is whether or not kidneys will recover and how long we should continue CVVHD. With inotrope dependence and severe HF, not candidate for iHD. Would like to give it at least another week. Renal ok with this. Thus will plan to move CVVHD catheter to IJ in am so he can get out of bed. Todays INR 1.9. Repeat INR. If INR accurate. Give 3 units of FFP.  Make NPO. IR following.   2. Acute on CKD III:  Anuric. Now on CVVHD (started 11/17).  3. PAF/atrial tach:  Had  transient CHB, now looks like atrial tachycardia with 2:1 block. Will consider overdrive pacing, EP following. Repeat echo to look for vegetation/abscess 4. H/O LV thrombus  5. H/o polysubstance abuse (ETOH, cocaine, tobacco use) 6. Hyperkalemia: Resolved CVVH.  9. MSSA sepsis  - Now on Ancef and rifampin. With CHB will proceed with echo to look for endocarditis though f/u cx are negative. May need TEE if/when more stable 10. Syncope: VT [employer].  Device interrogation showed multiple episodes of VT. Loading amio per EP. K repleted. No further VT over the last few days. EP following --On po amio 11. Melena-small amount reported. Check FOBT. Hgb stable.    Length of Stay: 13  Amy Clegg, NP-C  10/23/2016, 7:13 AM  Advanced Heart Failure Team Pager [phone] (M-F; 7a - 4p)  Please contact [facility] for night-coverage after hours (4p -7a ) and weekends on [email or web address]  Patient seen with NP, agree with the above note.  He has been stable overnight.   Difficult rhythm, ?atrial tachy with 2:1 block but not sure, will get ECG (has not had for several days).   Increase midodrine to 10 mg tid and try to titrate down norepinephrine.   Severe RV failure, suspect we will not be able to drive CVP much lower and maintain BP.  CVVH currently running even, need to try to get him off pressor.  Unfortunately, does not appear to have a good end-point.  Would be poor HD candidate.  Plan as of now is to try CVVH for a few more days to look for recovery.   Marca Ancona 10/23/2016 8:03 AM

## 2016-10-23 NOTE — Progress Notes (Signed)
  INR still to high to safely proceed with tunneled catheter.  Please correct INR  Will proceed with tunneled HD cath when INR < or = 1.5  Leodan Bolyard S Sruti Ayllon PA-C 10/23/2016 2:38 PM

## 2016-10-24 ENCOUNTER — Encounter (HOSPITAL_COMMUNITY): Payer: Self-pay | Admitting: Interventional Radiology

## 2016-10-24 ENCOUNTER — Inpatient Hospital Stay (HOSPITAL_COMMUNITY): Payer: Medicaid Other

## 2016-10-24 HISTORY — PX: IR GENERIC HISTORICAL: IMG1180011

## 2016-10-24 LAB — RENAL FUNCTION PANEL
ALBUMIN: 2.5 g/dL — AB (ref 3.5–5.0)
ANION GAP: 10 (ref 5–15)
ANION GAP: 10 (ref 5–15)
Albumin: 2.8 g/dL — ABNORMAL LOW (ref 3.5–5.0)
BUN: 10 mg/dL (ref 6–20)
BUN: 6 mg/dL (ref 6–20)
CHLORIDE: 99 mmol/L — AB (ref 101–111)
CO2: 24 mmol/L (ref 22–32)
CO2: 26 mmol/L (ref 22–32)
CREATININE: 1.17 mg/dL (ref 0.61–1.24)
Calcium: 8.5 mg/dL — ABNORMAL LOW (ref 8.9–10.3)
Calcium: 8.7 mg/dL — ABNORMAL LOW (ref 8.9–10.3)
Chloride: 98 mmol/L — ABNORMAL LOW (ref 101–111)
Creatinine, Ser: 1.27 mg/dL — ABNORMAL HIGH (ref 0.61–1.24)
GFR calc Af Amer: >60 mL/min (ref >60)
GFR calc non Af Amer: >60 mL/min (ref >60)
Glucose, Bld: 101 mg/dL — ABNORMAL HIGH (ref 65–99)
Glucose, Bld: 107 mg/dL — ABNORMAL HIGH (ref 65–99)
PHOSPHORUS: 2.4 mg/dL — AB (ref 2.5–4.6)
POTASSIUM: 4.2 mmol/L (ref 3.5–5.1)
Phosphorus: 3.9 mg/dL (ref 2.5–4.6)
Potassium: 4.3 mmol/L (ref 3.5–5.1)
Sodium: 132 mmol/L — ABNORMAL LOW (ref 135–145)
Sodium: 135 mmol/L (ref 135–145)

## 2016-10-24 LAB — PROTIME-INR
INR: 2.33
INR: 2.17
Prothrombin Time: 26 seconds — ABNORMAL HIGH (ref 11.4–15.2)
Prothrombin Time: 24.6 seconds — ABNORMAL HIGH (ref 11.4–15.2)

## 2016-10-24 LAB — GLUCOSE, CAPILLARY
GLUCOSE-CAPILLARY: 102 mg/dL — AB (ref 65–99)
GLUCOSE-CAPILLARY: 98 mg/dL (ref 65–99)
GLUCOSE-CAPILLARY: 168 mg/dL — AB (ref 65–99)
Glucose-Capillary: 102 mg/dL — ABNORMAL HIGH (ref 65–99)

## 2016-10-24 LAB — CBC
HEMATOCRIT: 31.7 % — AB (ref 39.0–52.0)
HEMOGLOBIN: 11.5 g/dL — AB (ref 13.0–17.0)
MCH: 28.7 pg (ref 26.0–34.0)
MCHC: 36.3 g/dL — ABNORMAL HIGH (ref 30.0–36.0)
MCV: 79.1 fL (ref 78.0–100.0)
Platelets: 142 10*3/uL — ABNORMAL LOW (ref 150–400)
RBC: 4.01 MIL/uL — ABNORMAL LOW (ref 4.22–5.81)
RDW: 21.8 % — ABNORMAL HIGH (ref 11.5–15.5)
WBC: 11.2 10*3/uL — AB (ref 4.0–10.5)

## 2016-10-24 LAB — MAGNESIUM: Magnesium: 2.5 mg/dL — ABNORMAL HIGH (ref 1.7–2.4)

## 2016-10-24 LAB — HEPARIN LEVEL (UNFRACTIONATED): HEPARIN UNFRACTIONATED: 0.54 [IU]/mL (ref 0.30–0.70)

## 2016-10-24 LAB — HEPATIC FUNCTION PANEL
ALBUMIN: 2.5 g/dL — AB (ref 3.5–5.0)
ALK PHOS: 80 U/L (ref 38–126)
ALT: 6 U/L — AB (ref 17–63)
AST: 23 U/L (ref 15–41)
BILIRUBIN TOTAL: 19.5 mg/dL — AB (ref 0.3–1.2)
Bilirubin, Direct: 14.1 mg/dL — ABNORMAL HIGH (ref 0.1–0.5)
Indirect Bilirubin: 5.4 mg/dL — ABNORMAL HIGH (ref 0.3–0.9)
Total Protein: 7.2 g/dL (ref 6.5–8.1)

## 2016-10-24 LAB — COOXEMETRY PANEL
Carboxyhemoglobin: 2.6 % — ABNORMAL HIGH (ref 0.5–1.5)
METHEMOGLOBIN: 0.9 % (ref 0.0–1.5)
O2 SAT: 42.1 %
TOTAL HEMOGLOBIN: 10.9 g/dL — AB (ref 12.0–16.0)

## 2016-10-24 LAB — APTT: aPTT: 197 seconds (ref 24–36)

## 2016-10-24 MED ORDER — SODIUM CHLORIDE 0.9 % IV SOLN
INTRAVENOUS | Status: AC | PRN
  Administered 2016-10-24: 30 mL/h via INTRAVENOUS

## 2016-10-24 MED ORDER — CEFAZOLIN SODIUM-DEXTROSE 2-4 GM/100ML-% IV SOLN
2.0000 g | Freq: Once | INTRAVENOUS | Status: AC
  Administered 2016-10-24: 2 g via INTRAVENOUS

## 2016-10-24 MED ORDER — GELATIN ABSORBABLE 12-7 MM EX MISC
CUTANEOUS | Status: AC
  Filled 2016-10-24: qty 1

## 2016-10-24 MED ORDER — LIDOCAINE HCL 1 % IJ SOLN
INTRAMUSCULAR | Status: AC | PRN
  Administered 2016-10-24: 5 mL

## 2016-10-24 MED ORDER — CEFAZOLIN SODIUM-DEXTROSE 2-4 GM/100ML-% IV SOLN
2.0000 g | Freq: Two times a day (BID) | INTRAVENOUS | Status: DC
  Administered 2016-10-24 – 2016-10-30 (×11): 2 g via INTRAVENOUS
  Filled 2016-10-24 (×13): qty 100

## 2016-10-24 MED ORDER — PHYTONADIONE 5 MG PO TABS
2.5000 mg | ORAL_TABLET | Freq: Once | ORAL | Status: AC
  Administered 2016-10-24: 2.5 mg via ORAL
  Filled 2016-10-24: qty 1

## 2016-10-24 MED ORDER — SODIUM CHLORIDE 0.9 % IV SOLN: Freq: Once | INTRAVENOUS | Status: AC

## 2016-10-24 MED ORDER — LIDOCAINE HCL 1 % IJ SOLN
INTRAMUSCULAR | Status: AC
  Filled 2016-10-24: qty 20

## 2016-10-24 MED ORDER — HEPARIN SODIUM (PORCINE) 1000 UNIT/ML IJ SOLN
INTRAMUSCULAR | Status: AC
  Filled 2016-10-24: qty 1

## 2016-10-24 MED ORDER — HEPARIN (PORCINE) IN NACL 100-0.45 UNIT/ML-% IJ SOLN
1600.0000 [IU]/h | INTRAMUSCULAR | Status: DC
  Administered 2016-10-25 (×2): 1650 [IU]/h via INTRAVENOUS
  Administered 2016-10-25: 1600 [IU]/h via INTRAVENOUS
  Filled 2016-10-24 (×2): qty 250

## 2016-10-24 MED ORDER — CEFAZOLIN SODIUM-DEXTROSE 2-4 GM/100ML-% IV SOLN: 2.0000 g | INTRAVENOUS | Status: AC

## 2016-10-24 MED ORDER — MIDAZOLAM HCL 2 MG/2ML IJ SOLN
INTRAMUSCULAR | Status: AC | PRN
  Administered 2016-10-24: 1 mg via INTRAVENOUS

## 2016-10-24 MED ORDER — MIDAZOLAM HCL 2 MG/2ML IJ SOLN
INTRAMUSCULAR | Status: AC
  Filled 2016-10-24: qty 2

## 2016-10-24 MED ORDER — CEFAZOLIN SODIUM-DEXTROSE 2-4 GM/100ML-% IV SOLN
INTRAVENOUS | Status: AC
  Filled 2016-10-24: qty 100

## 2016-10-24 MED ORDER — SODIUM PHOSPHATES 45 MMOLE/15ML IV SOLN
30.0000 mmol | Freq: Once | INTRAVENOUS | Status: AC
  Administered 2016-10-24: 30 mmol via INTRAVENOUS
  Filled 2016-10-24: qty 10

## 2016-10-24 MED ORDER — GELATIN ABSORBABLE 12-7 MM EX MISC
CUTANEOUS | Status: AC | PRN
  Administered 2016-10-24: 1 via TOPICAL

## 2016-10-24 MED ORDER — CEFAZOLIN SODIUM-DEXTROSE 2-4 GM/100ML-% IV SOLN
2.0000 g | Freq: Three times a day (TID) | INTRAVENOUS | Status: DC
  Filled 2016-10-24 (×2): qty 100

## 2016-10-24 NOTE — Sedation Documentation (Signed)
Patient is resting comfortably. 

## 2016-10-24 NOTE — Progress Notes (Signed)
Subjective: Interval History: has complaints anxious to get cath out of leg.  On less pressors.  Objective: Vital signs in last 24 hours: Temp:  [97.5 F (36.4 C)-97.8 F (36.6 C)] 97.8 F (36.6 C) (11/29 0800) Pulse Rate:  [83-96] 93 (11/29 0800) Resp:  [0-38] 26 (11/29 0800) BP: (71-107)/(55-78) 96/68 (11/29 0800) SpO2:  [93 %-100 %] 98 % (11/29 0800) Weight:  [66.8 kg (147 lb 4.3 oz)] 66.8 kg (147 lb 4.3 oz) (11/29 0500) Weight change: -0.2 kg (-7.1 oz)  Intake/Output from previous day: 11/28 0701 - 11/29 0700 In: 2039.9 [P.O.:670; I.V.:911.9; IV Piggyback:458] Out: 2637 [Urine:10] Intake/Output this shift: Total I/O In: 35.5 [I.V.:35.5] Out: 51 [Other:51]  General appearance: alert, cooperative and no distress Resp: diminished breath sounds bilaterally and rales bibasilar Cardio: S1, S2 normal and systolic murmur: holosystolic 2/6, blowing at lower left sternal border GI: pos bs, liver down 10 cm Extremities: R fem cath  Lab Results:  Recent Labs  10/23/16 0430 10/24/16 0547  WBC 9.5 11.2*  HGB 12.0* 11.5*  HCT 33.0* 31.7*  PLT 124* 142*   BMET:  Recent Labs  10/23/16 1533 10/24/16 0546  NA 134* 132*  K 4.2 4.2  CL 100* 98*  CO2 23 24  GLUCOSE 120* 101*  BUN 8 10  CREATININE 0.91 1.27*  CALCIUM 8.7* 8.5*   No results for input(s): PTH in the last 72 hours. Iron Studies: No results for input(s): IRON, TIBC, TRANSFERRIN, FERRITIN in the last 72 hours.  Studies/Results: No results found.  I have reviewed the patient's current medications.  Assessment/Plan: 1 AKI CRRT going well, even to keep up R heart filling.  Trying to get line to neck if can get coags ok.   2 Anemia mild decrease 3 Severe CM 4 substance 5 congestive hepatopathy 6 coagulopathy P CRRT, get new line when feasible, lower pressors.    LOS: 14 days   Harlan Vinal,Derin L 10/24/2016,9:05 AM

## 2016-10-24 NOTE — Progress Notes (Signed)
Per Dr Vonzella Nipple, heparin can be restarted at 12 midinght barring recurrent bleeding.

## 2016-10-24 NOTE — Progress Notes (Signed)
Bleeding from right groin where trialysis catheter was removed earlier on today. This is the second time patient has bled from this site after hemostasis was achieved at 1800. Pressure held again for 20 minutes. Site is a level 0. Will continue to monitor closely.

## 2016-10-24 NOTE — Progress Notes (Signed)
ANTICOAGULATION CONSULT NOTE - Follow Up Consult  Pharmacy Consult for heparin Indication: atrial fibrillation  No Known Allergies  Patient Measurements: Height: 5\' 7"  (170.2 cm) Weight: 147 lb 4.3 oz (66.8 kg) IBW/kg (Calculated) : 66.1 Heparin Dosing Weight: 67 kg  Vital Signs: Temp: 97.7 F (36.5 C) (11/29 1954) Temp Source: Oral (11/29 1954) BP: 90/73 (11/29 1900) Pulse Rate: 95 (11/29 1900)  Labs:  Recent Labs  10/22/16 0540  10/22/16 0933  10/23/16 0429 10/23/16 0430 10/23/16 0745 10/23/16 1533 10/24/16 0546 10/24/16 0547 10/24/16 1331 10/24/16 1555  HGB 12.4*  --   --   --   --  12.0*  --   --   --  11.5*  --   --   HCT 34.1*  --   --   --   --  33.0*  --   --   --  31.7*  --   --   PLT 108*  --   --   --   --  124*  --   --   --  142*  --   --   APTT  --   --  164*  --  142*  --   --   --   --  197*  --   --   LABPROT  --   < > 21.8*  --  22.2*  --  23.1*  --   --  26.0* 24.6*  --   INR  --   < > 1.87  --  1.91  --  2.01  --   --  2.33 2.17  --   HEPARINUNFRC  --   --  0.48  --  0.48  --   --   --   --  0.54  --   --   CREATININE  --   < >  --   < >  --  1.47*  --  0.91 1.27*  --   --  1.17  < > = values in this interval not displayed.  Estimated Creatinine Clearance: 70.6 mL/min (by C-G formula based on SCr of 1.17 mg/dL).   Medications:  Prescriptions Prior to Admission  Medication Sig Dispense Refill Last Dose  . allopurinol (ZYLOPRIM) 100 MG tablet Take 100 mg by mouth daily.   2016/09/15 at Unknown time  . digoxin (LANOXIN) 0.25 MG tablet Take 0.25 mg by mouth daily.    10/09/2016 at Unknown time  . potassium chloride SA (K-DUR,KLOR-CON) 20 MEQ tablet Take 2 tablets (40 mEq total) by mouth daily. 30 tablet 3 10/09/2016 at Unknown time  . spironolactone (ALDACTONE) 25 MG tablet Take 1 tablet (25 mg total) by mouth daily. 30 tablet 3 10/09/2016 at Unknown time  . torsemide (DEMADEX) 100 MG tablet Take 100 mg by mouth 2 (two) times daily.   10/09/2016  at Unknown time  . warfarin (COUMADIN) 7.5 MG tablet Take 1 tablet (7.5 mg total) by mouth daily at 6 PM. (Patient taking differently: Take 3.75-7.5 mg by mouth daily at 6 PM. Patient takes 7.5 mg everyday except on Sunday patient takes 3.75 mg) 35 tablet 3 10/09/2016 at 1p  . amiodarone (PACERONE) 200 MG tablet Take 1 tablet (200 mg total) by mouth daily. (Patient not taking: Reported on 2016/09/15) 30 tablet 3 Not Taking at Unknown time  . nicotine (NICODERM CQ - DOSED IN MG/24 HOURS) 14 mg/24hr patch Place 1 patch (14 mg total) onto the skin daily. (Patient not taking: Reported on 2016/09/15) 28  patch 0 Not Taking at Unknown time    Assessment: 38 yoM admitted 10/17/16 on warfarin PTA for Afib. Pharmacy consulted to dose heparin while holding warfarin (last dose 11/15). Cleared to resume heparin per IR. Will restart at previous rate 4hr post catheter placement. Hgb stable at 11.5, Plt low but improving at 142. INR up to 2.3 despite holding warfarin. Pt noted bright red blood in stool yesterday, but reports he has had no further S/Sx bleeding.  Goal of Therapy:  Heparin level 0.3-0.7 units/ml Monitor platelets by anticoagulation protocol: Yes    Plan:  -Restart heparin at 1650 units/hr at 2130 11/29 -HL with AM labs -Monitor CBC and S/Sx of bleeding closely   Sherle Poe, PharmD Clinical Pharmacist 8:02 PM, 10/24/2016

## 2016-10-24 NOTE — Progress Notes (Signed)
Patient ID: Parker Johnson, male   DOB: Jun 18, 1966, 50 y.o.   MRN: [id number]    Advanced Heart Failure Rounding Note   Subjective:    Admitted with VT/syncope and profound cardiogenic shock. PICC placed with initial CO-OX 26%.   Underwent IABP and Trialysis cath placement on 11/17  On 11/19 developed sepsis. Bcx + for MSSA. Now on Ancef. F/u cultures negative.  IABP pulled 11/24. Swan out 11/25  Now on dobutamine 10 and norepi 12. IV amio switched to po. Remains in ?atrial tach with CHB  Co-ox 42%  CVP down to 21. CVVHD now keeping even. Weight down 33 pounds. Denies SOB. Making minimal urine.    INR 2.3. Heparin back on.   F/u bcx are negative.   Objective:   Weight Range:  Vital Signs:   Temp:  [97.5 F (36.4 C)-97.8 F (36.6 C)] 97.8 F (36.6 C) (11/29 0800) Pulse Rate:  [83-96] 93 (11/29 0800) Resp:  [0-38] 26 (11/29 0800) BP: (71-107)/(55-78) 96/68 (11/29 0800) SpO2:  [93 %-100 %] 98 % (11/29 0800) Weight:  [147 lb 4.3 oz (66.8 kg)] 147 lb 4.3 oz (66.8 kg) (11/29 0500) Last BM Date: 10/23/16  Weight change: Filed Weights   10/22/16 0400 10/23/16 0437 10/24/16 0500  Weight: 145 lb 4.5 oz (65.9 kg) 147 lb 11.3 oz (67 kg) 147 lb 4.3 oz (66.8 kg)    Intake/Output:   Intake/Output Summary (Last 24 hours) at 10/24/16 0829 Last data filed at 10/24/16 0800  Gross per 24 hour  Intake          2029.58 ml  Output             2641 ml  Net          -611.42 ml     Physical Exam: CVP 21  General:  Lying in bed. NAD HEENT: Normal except sclera icterus Neck: supple. JVP remains elevated to ear. RIJ cordis . Carotids 2+ bilat; no bruits. No thyromegaly or nodule noted.  Cor: PMI nondisplaced. RRR. No murmurs or rubs. +S3 Lungs:clear    Abdomen: soft, NT, ND, no HSM. No bruits or masses. +BS  Extremities: no cyanosis, clubbing, rash  RUE PICC. No edema. Ted hose present. R femoral trialysis cath.  Neuro: awake. Moves all 4 extremities w/o difficulty.   Telemetry:  Reviewed, atrial tach with CHB  Labs: Basic Metabolic Panel:  Recent Labs Lab 10/20/16 0430  10/21/16 0640  10/22/16 0806 10/22/16 1526 10/23/16 0430 10/23/16 1533 10/24/16 0546 10/24/16 0547  NA 129*  < > 129*  < > 130* 126* 131* 134* 132*  --   K 3.9  < > 4.0  < > 4.0 4.7 4.2 4.2 4.2  --   CL 95*  < > 97*  < > 99* 95* 97* 100* 98*  --   CO2 22  < > 22  < > 22 23 25 23 24   --   GLUCOSE 132*  < > 203*  < > 151* 354* 121* 120* 101*  --   BUN 16  < > 14  < > 13 15 12 8 10   --   CREATININE 1.86*  < > 1.84*  < > 1.57* 1.75* 1.47* 0.91 1.27*  --   CALCIUM 8.2*  < > 8.2*  < > 8.5* 8.0* 8.7* 8.7* 8.5*  --   MG 2.2  --  2.3  --  2.3  --  2.4  --   --  2.5*  PHOS 2.4*  < >  2.1*  < > 1.9* 4.1 2.4* 2.7 2.4*  --   < > = values in this interval not displayed.  Liver Function Tests:  Recent Labs Lab 10/23/16 0430 10/23/16 1315 10/23/16 1533 10/24/16 0546 10/24/16 0547  AST  --  27  --   --  23  ALT  --  7*  --   --  6*  ALKPHOS  --  88  --   --  80  BILITOT  --  21.6*  --   --  19.5*  PROT  --  8.3*  --   --  7.2  ALBUMIN 2.7* 3.0* 3.0* 2.5* 2.5*   No results for input(s): LIPASE, AMYLASE in the last 168 hours. No results for input(s): AMMONIA in the last 168 hours.  CBC:  Recent Labs Lab 10/20/16 0430 10/21/16 0640 10/22/16 0540 10/23/16 0430 10/24/16 0547  WBC 9.2 8.4 8.5 9.5 11.2*  HGB 13.1 12.4* 12.4* 12.0* 11.5*  HCT 35.6* 34.5* 34.1* 33.0* 31.7*  MCV 77.9* 78.8 78.8 77.1* 79.1  PLT 89* 96* 108* 124* 142*    Cardiac Enzymes: No results for input(s): CKTOTAL, CKMB, CKMBINDEX, TROPONINI in the last 168 hours.  BNP: BNP (last 3 results)  Recent Labs  05/16/16 0420 06/21/16 1218 10/01/2016 0957  BNP 2,453.6* 2,504.3* 3,542.3*    ProBNP (last 3 results) No results for input(s): PROBNP in the last 8760 hours.    Other results:  Imaging: No results found.   Medications:     Scheduled Medications: . amiodarone  400 mg Oral BID  .  ceFAZolin  (ANCEF) IV  2 g Intravenous Q12H  . insulin aspart  0-5 Units Subcutaneous QHS  . insulin aspart  0-9 Units Subcutaneous TID WC  . midodrine  10 mg Oral TID WC  . rifampin  300 mg Oral Q8H  . senna-docusate  1 tablet Oral Daily  . sodium chloride flush  10-40 mL Intracatheter Q12H    Infusions: . sodium chloride Stopped (10/23/16 0730)  . DOBUTamine 10 mcg/kg/min (10/23/16 1800)  . heparin 1,650 Units/hr (10/23/16 2323)  . norepinephrine (LEVOPHED) Adult infusion 8 mcg/min (10/24/16 0547)  . dialysis replacement fluid (prismasate) 700 mL/hr at 10/24/16 0637  . dialysis replacement fluid (prismasate) 700 mL/hr at 10/24/16 0637  . dialysate (PRISMASATE) 1,500 mL/hr at 10/24/16 0240    PRN Medications: acetaminophen, bisacodyl, heparin, heparin, magnesium hydroxide, ondansetron (ZOFRAN) IV, sodium chloride, sodium chloride, sodium chloride flush, traMADol   Assessment/Plan/Discussion    1. 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  NICM.  - ECHO this admission EF 10% with severe RV dilation and dysfunction. - He is down 33 pounds with CVVHD. Still anuric. - Co-ox 42%. Remains on norepi 8 mcg + dobutamine 10 mcg. Continue midodrine 10 mg three times a day. Will need to increase norepi to 12 mcg.  - CVP coming down to 18. Not sure we can get much better with severe RV dysfunction - Not candidate for VAD or transplant so have not pursued ECMO (Would need heart kidney. Had ongoing tobacco use on admit and last cocaine use about 3 months ago. Blood type B+). - Main issue now is whether or not kidneys will recover and how long we should continue CVVHD. With inotrope dependence and severe HF, not candidate for iHD. Would like to give it at least another week. Renal ok with this. Thus will plan to move CVVHD catheter to IJ in am so he can get out  of bed. Todays INR 2.33. Give 3 units of FFP and 2.5 mg vitamin k. Check INR at 1300 Make NPO. I  called INR and discussed. If INR < 1.6 they will place tunneled catheter.    2. Acute on CKD III:  Anuric. Now on CVVHD (started 11/17).  3. PAF/atrial tach:  Had transient CHB, now looks like atrial tachycardia with 2:1 block. Will consider overdrive pacing, EP following. Repeat echo to look for vegetation/abscess 4. H/O LV thrombus 5. H/o polysubstance abuse (ETOH, cocaine, tobacco use) 6. Hyperkalemia: Resolved CVVH.  9. MSSA sepsis  - Now on Ancef and rifampin. With CHB will proceed with echo to look for endocarditis though f/u cx are negative. May need TEE if/when more stable 10. Syncope: VT [employer].  Device interrogation showed multiple episodes of VT. Loading amio per EP. K repleted. No further VT over the last few days. EP following --On po amio 11. Melena-small amount reported. Check FOBT. Hgb stable 11.5  12. Hyperbilirubinemia-  Total bilirubin 19.5 . Rifampin and Amio potential causative agents. Rifampin was started 7 days. Will discuss with Dr Gala Romney and ID for recommendations.    Length of Stay: 14  Amy Clegg, NP-C  10/24/2016, 8:29 AM  Advanced Heart Failure Team Pager [phone] (M-F; 7a - 4p)  Please contact [facility] for night-coverage after hours (4p -7a ) and weekends on [email or web address]  Patient seen and examined with Tonye Becket, NP. We discussed all aspects of the encounter. I agree with the assessment and plan as stated above.   He remains critically ill with multisystem organ failure in the setting of severe biventricular/end-stage HF. Remains on max dose dobutamine and norepi 12. (unable to wean farther even with midordrine). Also remains essentially anuric and bili now 20.   I think we are getting close to the end of the road with him. I tried to have this discussion today with him and his family but he remains somewhat inappropriately optimistic that he will recover.   Will proceed with tunneled cath placement today in RIJ so we can mobilize him.  However will not be candidate for long-term HD due to severe HF and inotrope dependence. We will continue to support for now.   Suspect elevated bili with normal LFTs due to Rifampin. We have discussed with ID and will stop. Will conjugate bili.   The patient is critically ill with multiple organ systems failure and requires high complexity decision making for assessment and support, frequent evaluation and titration of therapies, application of advanced monitoring technologies and extensive interpretation of multiple databases.   Critical Care Time devoted to patient care services described in this note is 45 Minutes.  Dodge Ator,MD 6:49 PM

## 2016-10-24 NOTE — Sedation Documentation (Signed)
Right chest dressing HD cath, clean dry and intact.  Awaiting transport team for tx back to floor.

## 2016-10-24 NOTE — Progress Notes (Signed)
  Interval events: Patient was able to get IJ HD catheter placement this evening.  He is having increasing jaundice over the last few days. Labs reveal  total bili of 19.5, direct bili of 14. ALP only 80 and no transaminitis.  Plan:  Spoke with pharmacy team this morning and agree with discontinuation of rifampin for the time being since he has evidence of elevated direct hyperbilirubinemia. 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 Would also consider changing out his picc line if possible  Plan for 6 wks IV then lifelong suppression.

## 2016-10-24 NOTE — Sedation Documentation (Signed)
O2 d/c'd 

## 2016-10-24 NOTE — Progress Notes (Signed)
ANTICOAGULATION CONSULT NOTE - Follow Up Consult  Pharmacy Consult for heparin Indication: atrial fibrillation  No Known Allergies  Patient Measurements: Height: 5\' 7"  (170.2 cm) Weight: 147 lb 4.3 oz (66.8 kg) IBW/kg (Calculated) : 66.1 Heparin Dosing Weight: 67 kg  Vital Signs: Temp: 97.5 F (36.4 C) (11/29 1040) Temp Source: Oral (11/29 1040) BP: 107/83 (11/29 1040) Pulse Rate: 90 (11/29 1040)  Labs:  Recent Labs  10/22/16 0540  10/22/16 0933  10/23/16 0429 10/23/16 0430 10/23/16 0745 10/23/16 1533 10/24/16 0546 10/24/16 0547  HGB 12.4*  --   --   --   --  12.0*  --   --   --  11.5*  HCT 34.1*  --   --   --   --  33.0*  --   --   --  31.7*  PLT 108*  --   --   --   --  124*  --   --   --  142*  APTT  --   --  164*  --  142*  --   --   --   --  197*  LABPROT  --   < > 21.8*  --  22.2*  --  23.1*  --   --  26.0*  INR  --   < > 1.87  --  1.91  --  2.01  --   --  2.33  HEPARINUNFRC  --   --  0.48  --  0.48  --   --   --   --  0.54  CREATININE  --   < >  --   < >  --  1.47*  --  0.91 1.27*  --   < > = values in this interval not displayed.  Estimated Creatinine Clearance: 65.1 mL/min (by C-G formula based on SCr of 1.27 mg/dL (H)).   Medications:  Prescriptions Prior to Admission  Medication Sig Dispense Refill Last Dose  . allopurinol (ZYLOPRIM) 100 MG tablet Take 100 mg by mouth daily.   2016-11-07 at Unknown time  . digoxin (LANOXIN) 0.25 MG tablet Take 0.25 mg by mouth daily.    10/09/2016 at Unknown time  . potassium chloride SA (K-DUR,KLOR-CON) 20 MEQ tablet Take 2 tablets (40 mEq total) by mouth daily. 30 tablet 3 10/09/2016 at Unknown time  . spironolactone (ALDACTONE) 25 MG tablet Take 1 tablet (25 mg total) by mouth daily. 30 tablet 3 10/09/2016 at Unknown time  . torsemide (DEMADEX) 100 MG tablet Take 100 mg by mouth 2 (two) times daily.   10/09/2016 at Unknown time  . warfarin (COUMADIN) 7.5 MG tablet Take 1 tablet (7.5 mg total) by mouth daily at 6 PM.  (Patient taking differently: Take 3.75-7.5 mg by mouth daily at 6 PM. Patient takes 7.5 mg everyday except on Sunday patient takes 3.75 mg) 35 tablet 3 10/09/2016 at 1p  . amiodarone (PACERONE) 200 MG tablet Take 1 tablet (200 mg total) by mouth daily. (Patient not taking: Reported on Nov 07, 2016) 30 tablet 3 Not Taking at Unknown time  . nicotine (NICODERM CQ - DOSED IN MG/24 HOURS) 14 mg/24hr patch Place 1 patch (14 mg total) onto the skin daily. (Patient not taking: Reported on November 07, 2016) 28 patch 0 Not Taking at Unknown time    Assessment: 28 yoM admitted 11/07/16 on warfarin PTA for Afib. Pharmacy consulted to dose heparin while holding warfarin (last dose 11/15). Heparin level therapeutic this morning at 0.54, Hgb stable at 11.5, Plt low but  improving at 142, INR trending up to 2.3 despite holding warfarin. Pt noted bright red blood in stool yesterday, but reports he has had no further S/Sx bleeding.  Goal of Therapy:  Heparin level 0.3-0.7 units/ml Monitor platelets by anticoagulation protocol: Yes    Plan:  -Continue heparin 1650 units/hr -Monitor daily heparin level, CBC and S/Sx of bleeding closely  Fredonia HighlandMichael Bitonti, PharmD PGY-1 Pharmacy Resident Pager: [phone] 10/24/2016

## 2016-10-24 NOTE — Sedation Documentation (Signed)
O2 2l/ applied 

## 2016-10-24 NOTE — Procedures (Signed)
Interventional Radiology Procedure Note  Procedure: Placement of Right IJ tunneled Palindrome HD catheter, 23 cm tip to cuff..  Tips in upper RA and ready for use.   Complications: None  Estimated Blood Loss: None  Recommendations: - Routine line care  Signed,  Sterling Big, MD

## 2016-10-25 LAB — PREPARE FRESH FROZEN PLASMA
BLOOD PRODUCT EXPIRATION DATE: 201712042359
BLOOD PRODUCT EXPIRATION DATE: 201712042359
BLOOD PRODUCT EXPIRATION DATE: 201712042359
ISSUE DATE / TIME: 201711291043
ISSUE DATE / TIME: 201711291150
ISSUE DATE / TIME: 201711291307
UNIT TYPE AND RH: 7300
Unit Type and Rh: 7300
Unit Type and Rh: 7300

## 2016-10-25 LAB — RENAL FUNCTION PANEL
ALBUMIN: 2.7 g/dL — AB (ref 3.5–5.0)
Albumin: 2.6 g/dL — ABNORMAL LOW (ref 3.5–5.0)
Anion gap: 9 (ref 5–15)
Anion gap: 10 (ref 5–15)
BUN: 9 mg/dL (ref 6–20)
BUN: 8 mg/dL (ref 6–20)
CALCIUM: 8.6 mg/dL — AB (ref 8.9–10.3)
CHLORIDE: 97 mmol/L — AB (ref 101–111)
CO2: 25 mmol/L (ref 22–32)
CO2: 25 mmol/L (ref 22–32)
CREATININE: 1.28 mg/dL — AB (ref 0.61–1.24)
CREATININE: 1.35 mg/dL — AB (ref 0.61–1.24)
Calcium: 8.6 mg/dL — ABNORMAL LOW (ref 8.9–10.3)
Chloride: 98 mmol/L — ABNORMAL LOW (ref 101–111)
GFR calc Af Amer: >60 mL/min (ref >60)
GFR calc Af Amer: >60 mL/min (ref >60)
GFR calc non Af Amer: >60 mL/min (ref >60)
GFR, EST NON AFRICAN AMERICAN: 60 mL/min — AB (ref >60)
GLUCOSE: 96 mg/dL (ref 65–99)
GLUCOSE: 121 mg/dL — AB (ref 65–99)
Phosphorus: 2.6 mg/dL (ref 2.5–4.6)
Phosphorus: 2.8 mg/dL (ref 2.5–4.6)
Potassium: 4.4 mmol/L (ref 3.5–5.1)
Potassium: 4.5 mmol/L (ref 3.5–5.1)
SODIUM: 132 mmol/L — AB (ref 135–145)
Sodium: 132 mmol/L — ABNORMAL LOW (ref 135–145)

## 2016-10-25 LAB — CBC
HCT: 29.6 % — ABNORMAL LOW (ref 39.0–52.0)
Hemoglobin: 10.6 g/dL — ABNORMAL LOW (ref 13.0–17.0)
MCH: 28.9 pg (ref 26.0–34.0)
MCHC: 35.8 g/dL (ref 30.0–36.0)
MCV: 80.7 fL (ref 78.0–100.0)
PLATELETS: 142 10*3/uL — AB (ref 150–400)
RBC: 3.67 MIL/uL — AB (ref 4.22–5.81)
RDW: 22.3 % — AB (ref 11.5–15.5)
WBC: 11.4 10*3/uL — AB (ref 4.0–10.5)

## 2016-10-25 LAB — AMMONIA: AMMONIA: 36 umol/L — AB (ref 9–35)

## 2016-10-25 LAB — HEPATIC FUNCTION PANEL
ALBUMIN: 2.6 g/dL — AB (ref 3.5–5.0)
ALK PHOS: 75 U/L (ref 38–126)
ALT: 9 U/L — AB (ref 17–63)
AST: 27 U/L (ref 15–41)
BILIRUBIN TOTAL: 19.6 mg/dL — AB (ref 0.3–1.2)
Bilirubin, Direct: 12.9 mg/dL — ABNORMAL HIGH (ref 0.1–0.5)
Indirect Bilirubin: 6.7 mg/dL — ABNORMAL HIGH (ref 0.3–0.9)
TOTAL PROTEIN: 7.3 g/dL (ref 6.5–8.1)

## 2016-10-25 LAB — COOXEMETRY PANEL
CARBOXYHEMOGLOBIN: 2.1 % — AB (ref 0.5–1.5)
Carboxyhemoglobin: 2.3 % — ABNORMAL HIGH (ref 0.5–1.5)
METHEMOGLOBIN: 1.1 % (ref 0.0–1.5)
Methemoglobin: 0.8 % (ref 0.0–1.5)
O2 Saturation: 31.3 %
O2 Saturation: 29.8 %
TOTAL HEMOGLOBIN: 9.8 g/dL — AB (ref 12.0–16.0)
Total hemoglobin: 14.7 g/dL (ref 12.0–16.0)

## 2016-10-25 LAB — LACTIC ACID, PLASMA: Lactic Acid, Venous: 1.7 mmol/L (ref 0.5–1.9)

## 2016-10-25 LAB — GLUCOSE, CAPILLARY
GLUCOSE-CAPILLARY: 116 mg/dL — AB (ref 65–99)
GLUCOSE-CAPILLARY: 98 mg/dL (ref 65–99)
Glucose-Capillary: 95 mg/dL (ref 65–99)

## 2016-10-25 LAB — PROTIME-INR
INR: 2.16
Prothrombin Time: 24.4 seconds — ABNORMAL HIGH (ref 11.4–15.2)

## 2016-10-25 LAB — HEPARIN LEVEL (UNFRACTIONATED)
HEPARIN UNFRACTIONATED: 0.3 [IU]/mL (ref 0.30–0.70)
HEPARIN UNFRACTIONATED: 0.47 [IU]/mL (ref 0.30–0.70)

## 2016-10-25 LAB — APTT: APTT: 118 s — AB (ref 24–36)

## 2016-10-25 NOTE — Progress Notes (Signed)
Subjective: Interval History: has no complaint, glad to get cath in neck.  Objective: Vital signs in last 24 hours: Temp:  [97.5 F (36.4 C)-97.7 F (36.5 C)] 97.5 F (36.4 C) (11/30 0800) Pulse Rate:  [85-125] 125 (11/30 0800) Resp:  [15-38] 26 (11/30 0800) BP: (64-110)/(38-97) 90/68 (11/30 0800) SpO2:  [91 %-100 %] 95 % (11/30 0800) Weight:  [68.2 kg (150 lb 5.7 oz)] 68.2 kg (150 lb 5.7 oz) (11/30 0630) Weight change: 1.4 kg (3 lb 1.4 oz)  Intake/Output from previous day: 11/29 0701 - 11/30 0700 In: 2513.6 [P.O.:628; I.V.:708.6; Blood:805; IV OQHUTMLYY:503] Out: 2147  Intake/Output this shift: Total I/O In: 169.7 [P.O.:50; I.V.:119.7] Out: 40 [Other:40]  General appearance: alert, cooperative and no distress Resp: rales bibasilar Chest wall: RIJ PC Cardio: S1, S2 normal, systolic murmur: holosystolic 2/6, blowing at apex and LV lift GI: at apex Extremitie1+edema 1+  GI liver down, 12 cm.   Lab Results:  Recent Labs  10/24/16 0547 10/25/16 0600  WBC 11.2* PENDING  HGB 11.5* 10.6*  HCT 31.7* 29.6*  PLT 142* 142*   BMET:  Recent Labs  10/24/16 1555 10/25/16 0600  NA 135 132*  K 4.3 4.4  CL 99* 98*  CO2 26 25  GLUCOSE 107* 96  BUN 6 9  CREATININE 1.17 1.28*  CALCIUM 8.7* 8.6*   No results for input(s): PTH in the last 72 hours. Iron Studies: No results for input(s): IRON, TIBC, TRANSFERRIN, FERRITIN in the last 72 hours.  Studies/Results: Ir Fluoro Guide Cv Line Right  Result Date: 10/24/2016 INDICATION: 50 year old male with acute renal failure in need of tunneled hemodialysis access. EXAM: TUNNELED CENTRAL VENOUS HEMODIALYSIS CATHETER PLACEMENT WITH ULTRASOUND AND FLUOROSCOPIC GUIDANCE MEDICATIONS: 2 g Ancef . The antibiotic was given in an appropriate time interval prior to skin puncture. ANESTHESIA/SEDATION: 1 g Versed said was administered for anxiolysis FLUOROSCOPY TIME:  Fluoroscopy Time: 0 minutes 12 seconds (0.9 mGy). COMPLICATIONS: None  immediate. PROCEDURE: Informed written consent was obtained from the patient after a discussion of the risks, benefits, and alternatives to treatment. Questions regarding the procedure were encouraged and answered. The right neck and chest were prepped with chlorhexidine in a sterile fashion, and a sterile drape was applied covering the operative field. Maximum barrier sterile technique with sterile gowns and gloves were used for the procedure. A timeout was performed prior to the initiation of the procedure. After creating a small venotomy incision, a micropuncture kit was utilized to access the right internal jugular vein under direct, real-time ultrasound guidance after the overlying soft tissues were anesthetized with 1% lidocaine with epinephrine. Ultrasound image documentation was performed. The microwire was kinked to measure appropriate catheter length. A stiff Glidewire was advanced to the level of the IVC and the micropuncture sheath was exchanged for a peel-away sheath. A Palindrome tunneled hemodialysis catheter measuring 23 cm from tip to cuff was tunneled in a retrograde fashion from the anterior chest wall to the venotomy incision. The catheter was then placed through the peel-away sheath with tips ultimately positioned within the superior aspect of the right atrium. Final catheter positioning was confirmed and documented with a spot radiographic image. The catheter aspirates and flushes normally. The catheter was flushed with appropriate volume heparin dwells. The catheter exit site was secured with a 0-Prolene retention suture. The venotomy incision was closed with derma bond. Dressings were applied. The patient tolerated the procedure well without immediate post procedural complication. IMPRESSION: Successful placement of 23 cm tip to cuff tunneled hemodialysis  catheter via the right internal jugular vein with tips terminating within the superior aspect of the right atrium. The catheter is ready  for immediate use. Signed, Criselda Peaches, MD [facility] Radiology Electronically Signed   By: Jacqulynn Cadet M.D.   On: 10/24/2016 17:40   Ir US Guide Vasc Access Right  Result Date: 10/24/2016 INDICATION: 49 year old male with acute renal failure in need of tunneled hemodialysis access. EXAM: TUNNELED CENTRAL VENOUS HEMODIALYSIS CATHETER PLACEMENT WITH ULTRASOUND AND FLUOROSCOPIC GUIDANCE MEDICATIONS: 2 g Ancef . The antibiotic was given in an appropriate time interval prior to skin puncture. ANESTHESIA/SEDATION: 1 g Versed said was administered for anxiolysis FLUOROSCOPY TIME:  Fluoroscopy Time: 0 minutes 12 seconds (0.9 mGy). COMPLICATIONS: None immediate. PROCEDURE: Informed written consent was obtained from the patient after a discussion of the risks, benefits, and alternatives to treatment. Questions regarding the procedure were encouraged and answered. The right neck and chest were prepped with chlorhexidine in a sterile fashion, and a sterile drape was applied covering the operative field. Maximum barrier sterile technique with sterile gowns and gloves were used for the procedure. A timeout was performed prior to the initiation of the procedure. After creating a small venotomy incision, a micropuncture kit was utilized to access the right internal jugular vein under direct, real-time ultrasound guidance after the overlying soft tissues were anesthetized with 1% lidocaine with epinephrine. Ultrasound image documentation was performed. The microwire was kinked to measure appropriate catheter length. A stiff Glidewire was advanced to the level of the IVC and the micropuncture sheath was exchanged for a peel-away sheath. A Palindrome tunneled hemodialysis catheter measuring 23 cm from tip to cuff was tunneled in a retrograde fashion from the anterior chest wall to the venotomy incision. The catheter was then placed through the peel-away sheath with  tips ultimately positioned within the superior aspect of the right atrium. Final catheter positioning was confirmed and documented with a spot radiographic image. The catheter aspirates and flushes normally. The catheter was flushed with appropriate volume heparin dwells. The catheter exit site was secured with a 0-Prolene retention suture. The venotomy incision was closed with derma bond. Dressings were applied. The patient tolerated the procedure well without immediate post procedural complication. IMPRESSION: Successful placement of 23 cm tip to cuff tunneled hemodialysis catheter via the right internal jugular vein with tips terminating within the superior aspect of the right atrium. The catheter is ready for immediate use. Signed, Criselda Peaches, MD [facility] Radiology Electronically Signed   By: Jacqulynn Cadet M.D.   On: 10/24/2016 17:40    I have reviewed the patient's current medications.  Assessment/Plan: 1 AKI hemodynamic ATN.  Vol xs but needs ^ R heart filling press.  2 Severe CM primary issue, still on NE, DB 3 Anemia 4 hx substance P try up in chair, cont cRRT,  Wean NE if poss    LOS: 15 days   Kameshia Madruga,Gearald L 10/25/2016,8:42 AM

## 2016-10-25 NOTE — Progress Notes (Signed)
Attempted to get patient OOB x3. He stated he wanted to wait until he ate and napped. I will attempt again this evening for dinner.

## 2016-10-25 NOTE — Progress Notes (Signed)
Patient ID: Parker Johnson, male   DOB: 08-29-1966, 50 y.o.   MRN: [id number]    Advanced Heart Failure Rounding Note   Subjective:    Admitted with VT/syncope and profound cardiogenic shock. PICC placed with initial CO-OX 26%.   Underwent IABP and Trialysis cath placement on 11/17  On 11/19 developed sepsis. Bcx + for MSSA. Now on Ancef. F/u cultures negative.  IABP pulled 11/24. Swan out 11/25  Now on dobutamine 10 and norepi 12. IV amio switched to po. Remains in ?atrial tach with CHB  Co-ox 31%  CVP up 25. CVVHD now keeping even. Not making urine. Weight down 30 pounds. Yesterday total bilirubin was increased so rifampin was stopped.   Denies SOB.    INR 2.16. Heparin back on.   F/u bcx are negative.   Objective:   Weight Range:  Vital Signs:   Temp:  [97.5 F (36.4 C)-97.8 F (36.6 C)] 97.5 F (36.4 C) (11/29 2343) Pulse Rate:  [85-98] 95 (11/30 0700) Resp:  [15-38] 20 (11/30 0700) BP: (64-110)/(38-97) 88/72 (11/30 0700) SpO2:  [91 %-100 %] 98 % (11/30 0700) Weight:  [150 lb 5.7 oz (68.2 kg)] 150 lb 5.7 oz (68.2 kg) (11/30 0630) Last BM Date: 10/23/16  Weight change: Filed Weights   10/23/16 0437 10/24/16 0500 10/25/16 0630  Weight: 147 lb 11.3 oz (67 kg) 147 lb 4.3 oz (66.8 kg) 150 lb 5.7 oz (68.2 kg)    Intake/Output:   Intake/Output Summary (Last 24 hours) at 10/25/16 0746 Last data filed at 10/25/16 0700  Gross per 24 hour  Intake           2513.6 ml  Output             2147 ml  Net            366.6 ml     Physical Exam: CVP 25  General:  Lying in bed. NAD HEENT: Normal except sclera icterus Neck: supple. JVP remains elevated to ear. RIJ cordis . Carotids 2+ bilat; no bruits. No thyromegaly or nodule noted.  Cor: PMI nondisplaced. RRR. No murmurs or rubs. +S3 Lungs:clear    Abdomen: soft, NT, ND, no HSM. No bruits or masses. +BS  Extremities: no cyanosis, clubbing, rash  RUE PICC. No edema. Ted hose present. R femoral trialysis cath.  Neuro:  awake. Moves all 4 extremities w/o difficulty.   Telemetry: Reviewed, atrial tach with CHB  Labs: Basic Metabolic Panel:  Recent Labs Lab 10/20/16 0430  10/21/16 0640  10/22/16 0806  10/23/16 0430 10/23/16 1533 10/24/16 0546 10/24/16 0547 10/24/16 1555 10/25/16 0600  NA 129*  < > 129*  < > 130*  < > 131* 134* 132*  --  135 132*  K 3.9  < > 4.0  < > 4.0  < > 4.2 4.2 4.2  --  4.3 4.4  CL 95*  < > 97*  < > 99*  < > 97* 100* 98*  --  99* 98*  CO2 22  < > 22  < > 22  < > _0 --  26 25  GLUCOSE 132*  < > 203*  < > 151*  < > 121* 120* 101*  --  107* 96  BUN 16  < > 14  < > 13  < > _1 --  6 9  CREATININE 1.86*  < > 1.84*  < > 1.57*  < > 1.47* 0.91 1.27*  --  1.17  1.28*  CALCIUM 8.2*  < > 8.2*  < > 8.5*  < > 8.7* 8.7* 8.5*  --  8.7* 8.6*  MG 2.2  --  2.3  --  2.3  --  2.4  --   --  2.5*  --   --   PHOS 2.4*  < > 2.1*  < > 1.9*  < > 2.4* 2.7 2.4*  --  3.9 2.6  < > = values in this interval not displayed.  Liver Function Tests:  Recent Labs Lab 10/23/16 1315 10/23/16 1533 10/24/16 0546 10/24/16 0547 10/24/16 1555 10/25/16 0600  AST 27  --   --  23  --   --   ALT 7*  --   --  6*  --   --   ALKPHOS 88  --   --  80  --   --   BILITOT 21.6*  --   --  19.5*  --   --   PROT 8.3*  --   --  7.2  --   --   ALBUMIN 3.0* 3.0* 2.5* 2.5* 2.8* 2.6*   No results for input(s): LIPASE, AMYLASE in the last 168 hours. No results for input(s): AMMONIA in the last 168 hours.  CBC:  Recent Labs Lab 10/20/16 0430 10/21/16 0640 10/22/16 0540 10/23/16 0430 10/24/16 0547  WBC 9.2 8.4 8.5 9.5 11.2*  HGB 13.1 12.4* 12.4* 12.0* 11.5*  HCT 35.6* 34.5* 34.1* 33.0* 31.7*  MCV 77.9* 78.8 78.8 77.1* 79.1  PLT 89* 96* 108* 124* 142*    Cardiac Enzymes: No results for input(s): CKTOTAL, CKMB, CKMBINDEX, TROPONINI in the last 168 hours.  BNP: BNP (last 3 results)  Recent Labs  05/16/16 0420 06/21/16 1218 10/23/2016 0957  BNP 2,453.6* 2,504.3* 3,542.3*    ProBNP (last 3  results) No results for input(s): PROBNP in the last 8760 hours.    Other results:  Imaging: Ir Fluoro Guide Cv Line Right  Result Date: 10/24/2016 INDICATION: 50 year old male with acute renal failure in need of tunneled hemodialysis access. EXAM: TUNNELED CENTRAL VENOUS HEMODIALYSIS CATHETER PLACEMENT WITH ULTRASOUND AND FLUOROSCOPIC GUIDANCE MEDICATIONS: 2 g Ancef . The antibiotic was given in an appropriate time interval prior to skin puncture. ANESTHESIA/SEDATION: 1 g Versed said was administered for anxiolysis FLUOROSCOPY TIME:  Fluoroscopy Time: 0 minutes 12 seconds (0.9 mGy). COMPLICATIONS: None immediate. PROCEDURE: Informed written consent was obtained from the patient after a discussion of the risks, benefits, and alternatives to treatment. Questions regarding the procedure were encouraged and answered. The right neck and chest were prepped with chlorhexidine in a sterile fashion, and a sterile drape was applied covering the operative field. Maximum barrier sterile technique with sterile gowns and gloves were used for the procedure. A timeout was performed prior to the initiation of the procedure. After creating a small venotomy incision, a micropuncture kit was utilized to access the right internal jugular vein under direct, real-time ultrasound guidance after the overlying soft tissues were anesthetized with 1% lidocaine with epinephrine. Ultrasound image documentation was performed. The microwire was kinked to measure appropriate catheter length. A stiff Glidewire was advanced to the level of the IVC and the micropuncture sheath was exchanged for a peel-away sheath. A Palindrome tunneled hemodialysis catheter measuring 23 cm from tip to cuff was tunneled in a retrograde fashion from the anterior chest wall to the venotomy incision. The catheter was then placed through the peel-away sheath with tips ultimately positioned within the superior aspect of the right atrium. Final  catheter  positioning was confirmed and documented with a spot radiographic image. The catheter aspirates and flushes normally. The catheter was flushed with appropriate volume heparin dwells. The catheter exit site was secured with a 0-Prolene retention suture. The venotomy incision was closed with derma bond. Dressings were applied. The patient tolerated the procedure well without immediate post procedural complication. IMPRESSION: Successful placement of 23 cm tip to cuff tunneled hemodialysis catheter via the right internal jugular vein with tips terminating within the superior aspect of the right atrium. The catheter is ready for immediate use. Signed, Heath K. McCullough, MD [facility] [place] [facility] Electronically Signed   By: Heath  McCullough M.D.   On: 10/24/2016 17:40   Ir Us Guide Vasc Access Right  Result Date: 10/24/2016 INDICATION: 50-year-old male with acute renal failure in need of tunneled hemodialysis access. EXAM: TUNNELED CENTRAL VENOUS HEMODIALYSIS CATHETER PLACEMENT WITH ULTRASOUND AND FLUOROSCOPIC GUIDANCE MEDICATIONS: 2 g Ancef . The antibiotic was given in an appropriate time interval prior to skin puncture. ANESTHESIA/SEDATION: 1 g Versed said was administered for anxiolysis FLUOROSCOPY TIME:  Fluoroscopy Time: 0 minutes 12 seconds (0.9 mGy). COMPLICATIONS: None immediate. PROCEDURE: Informed written consent was obtained from the patient after a discussion of the risks, benefits, and alternatives to treatment. Questions regarding the procedure were encouraged and answered. The right neck and chest were prepped with chlorhexidine in a sterile fashion, and a sterile drape was applied covering the operative field. Maximum barrier sterile technique with sterile gowns and gloves were used for the procedure. A timeout was performed prior to the initiation of the procedure. After creating a small venotomy incision, a micropuncture kit was utilized to access  the right internal jugular vein under direct, real-time ultrasound guidance after the overlying soft tissues were anesthetized with 1% lidocaine with epinephrine. Ultrasound image documentation was performed. The microwire was kinked to measure appropriate catheter length. A stiff Glidewire was advanced to the level of the IVC and the micropuncture sheath was exchanged for a peel-away sheath. A Palindrome tunneled hemodialysis catheter measuring 23 cm from tip to cuff was tunneled in a retrograde fashion from the anterior chest wall to the venotomy incision. The catheter was then placed through the peel-away sheath with tips ultimately positioned within the superior aspect of the right atrium. Final catheter positioning was confirmed and documented with a spot radiographic image. The catheter aspirates and flushes normally. The catheter was flushed with appropriate volume heparin dwells. The catheter exit site was secured with a 0-Prolene retention suture. The venotomy incision was closed with derma bond. Dressings were applied. The patient tolerated the procedure well without immediate post procedural complication. IMPRESSION: Successful placement of 23 cm tip to cuff tunneled hemodialysis catheter via the right internal jugular vein with tips terminating within the superior aspect of the right atrium. The catheter is ready for immediate use. Signed, Heath K. McCullough, MD [facility] Electronically Signed   By: Heath  McCullough M.D.   On: 10/24/2016 17:40     Medications:     Scheduled Medications: . amiodarone  400 mg Oral BID  .  ceFAZolin (ANCEF) IV  2 g Intravenous to XRAY  .  ceFAZolin (ANCEF) IV  2 g Intravenous Q12H  . insulin aspart  0-5 Units Subcutaneous QHS  . insulin aspart  0-9 Units Subcutaneous TID WC  . midodrine  10 mg Oral TID WC  . senna-docusate  1 tablet Oral Daily  . sodium chloride flush    10-40 mL Intracatheter Q12H      Infusions: . sodium chloride Stopped (10/23/16 0730)  . DOBUTamine 10 mcg/kg/min (10/24/16 1757)  . heparin 1,650 Units/hr (10/25/16 0130)  . norepinephrine (LEVOPHED) Adult infusion 14 mcg/min (10/25/16 0700)  . dialysis replacement fluid (prismasate) 700 mL/hr at 10/25/16 0614  . dialysis replacement fluid (prismasate) 700 mL/hr at 10/25/16 0615  . dialysate (PRISMASATE) 1,500 mL/hr at 10/25/16 0413    PRN Medications: acetaminophen, bisacodyl, heparin, heparin, magnesium hydroxide, ondansetron (ZOFRAN) IV, sodium chloride, sodium chloride, sodium chloride flush, traMADol   Assessment/Plan/Discussion    1. 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  NICM.  - ECHO this admission EF 10% with severe RV dilation and dysfunction. - He is down 33 pounds with CVVHD. Still anuric. - Co-ox 31%. Norepi increased to 14 mcg and remains on dobutamine 10 mcg.  Continue midodrine 10 mg three times a day.   - CVP up to 25.  Not sure we can get much better with severe RV dysfunction - Not candidate for VAD or transplant so have not pursued ECMO (Would need heart kidney. Had ongoing tobacco use on admit and last cocaine use about 3 months ago. Blood type B+). - Main issue now is whether or not kidneys will recover and how long we should continue CVVHD. With inotrope dependence and severe HF, not candidate for iHD. Would like to give it at least another week.  2. Acute on CKD III:  Anuric. Now on CVVHD (started 11/17).  3. PAF/atrial tach:  Had transient CHB, now looks like atrial tachycardia with 2:1 block. Will consider overdrive pacing, EP following. TEE set up for Friday.  Per EP not a candidate for ICD explant.  4. H/O LV thrombus 5. H/o polysubstance abuse (ETOH, cocaine, tobacco use) 6. Hyperkalemia: Resolved CVVH.  9. MSSA sepsis  - Now on Ancef and rifampin. With CHB will proceed with echo to look for endocarditis though f/u cx are  negative. May need TEE if/when more stable 10. Syncope: VT [employer].  Device interrogation showed multiple episodes of VT. Loading amio per EP. K repleted. No further VT over the last few days. EP following --On po amio 11. Melena-small amount reported. Check FOBT. Hgb stable 10.6  12. Hyperbilirubinemia-  Total bilirubin 19.5 . Rifampin and Amio potential causative agents. Rifampin stopped.   OOB today.  PT consult.    Length of Stay: 15  Amy Clegg, NP-C  10/25/2016, 7:46 AM  Advanced Heart Failure Team Pager [phone] (M-F; 7a - 4p)  Please contact [facility] for night-coverage after hours (4p -7a ) and weekends on [email or web address]  Patient seen and examined with Amy Clegg, NP. We discussed all aspects of the encounter. I agree with the assessment and plan as stated above.   He has become progressively ill. More lethargic. Co-ox dropping rapidly despite dual pressor support. Remains anuric. Bili still up. Lactate and ammonia ok currently. Suspect he will not last long. I discussed this with him but he continue to be inappropriately optimistic for recovery. Will continue to support the best we can.   The patient is critically ill with multiple organ systems failure and requires high complexity decision making for assessment and support, frequent evaluation and titration of therapies, application of advanced monitoring technologies and extensive interpretation of multiple databases.   Critical Care Time devoted to patient care services described in this note is 35 Minutes.  Bensimhon, Daniel,MD 6:28 PM    

## 2016-10-25 NOTE — Progress Notes (Signed)
At 1800 Removed pt Right femoral trialysis catheter. Held pressure for 20 mins. Site level 0 pressure dressing applied educated patient. At 1830 pt called out upon assessment his groin was bleeding. Held pressure for another 25 mins. Site a level 0 pressure dressing applied. Reeducated patient. Report given to night shift RN.

## 2016-10-25 NOTE — Progress Notes (Addendum)
ANTICOAGULATION CONSULT NOTE - Follow Up Consult  Pharmacy Consult for heparin Indication: atrial fibrillation  No Known Allergies  Patient Measurements: Height: 5\' 7"  (170.2 cm) Weight: 150 lb 5.7 oz (68.2 kg) IBW/kg (Calculated) : 66.1 Heparin Dosing Weight: 67 kg  Vital Signs: Temp: 97.5 F (36.4 C) (11/29 2343) Temp Source: Oral (11/29 2343) BP: 90/68 (11/30 0800) Pulse Rate: 125 (11/30 0800)  Labs:  Recent Labs  10/22/16 0933  10/23/16 0429  10/23/16 0430  10/24/16 0546 10/24/16 0547 10/24/16 1331 10/24/16 1555 10/25/16 0600  HGB  --   --   --   < > 12.0*  --   --  11.5*  --   --  10.6*  HCT  --   --   --   --  33.0*  --   --  31.7*  --   --  29.6*  PLT  --   --   --   --  124*  --   --  142*  --   --  142*  APTT 164*  --  142*  --   --   --   --  197*  --   --   --   LABPROT 21.8*  --  22.2*  --   --   < >  --  26.0* 24.6*  --  24.4*  INR 1.87  --  1.91  --   --   < >  --  2.33 2.17  --  2.16  HEPARINUNFRC 0.48  --  0.48  --   --   --   --  0.54  --   --  0.30  CREATININE  --   < >  --   --  1.47*  < > 1.27*  --   --  1.17 1.28*  < > = values in this interval not displayed.  Estimated Creatinine Clearance: 64.6 mL/min (by C-G formula based on SCr of 1.28 mg/dL (H)).   Medications:  Prescriptions Prior to Admission  Medication Sig Dispense Refill Last Dose  . allopurinol (ZYLOPRIM) 100 MG tablet Take 100 mg by mouth daily.   10/05/2016 at Unknown time  . digoxin (LANOXIN) 0.25 MG tablet Take 0.25 mg by mouth daily.    10/09/2016 at Unknown time  . potassium chloride SA (K-DUR,KLOR-CON) 20 MEQ tablet Take 2 tablets (40 mEq total) by mouth daily. 30 tablet 3 10/09/2016 at Unknown time  . spironolactone (ALDACTONE) 25 MG tablet Take 1 tablet (25 mg total) by mouth daily. 30 tablet 3 10/09/2016 at Unknown time  . torsemide (DEMADEX) 100 MG tablet Take 100 mg by mouth 2 (two) times daily.   10/09/2016 at Unknown time  . warfarin (COUMADIN) 7.5 MG tablet Take 1  tablet (7.5 mg total) by mouth daily at 6 PM. (Patient taking differently: Take 3.75-7.5 mg by mouth daily at 6 PM. Patient takes 7.5 mg everyday except on Sunday patient takes 3.75 mg) 35 tablet 3 10/09/2016 at 1p  . amiodarone (PACERONE) 200 MG tablet Take 1 tablet (200 mg total) by mouth daily. (Patient not taking: Reported on 10/17/2016) 30 tablet 3 Not Taking at Unknown time  . nicotine (NICODERM CQ - DOSED IN MG/24 HOURS) 14 mg/24hr patch Place 1 patch (14 mg total) onto the skin daily. (Patient not taking: Reported on 10/18/2016) 28 patch 0 Not Taking at Unknown time    Assessment: 83 yoM admitted 10/01/2016 on warfarin PTA for AFib. Pharmacy consulted to dose heparin while holding warfarin (  last dose 11/15). Heparin was held by IR for placement of tunneled IJ 11/29 afternoon. Per RN, pt had bleeding from R groin site twice on 11/29 after the prior line was removed. No S/Sx of bleeding have been seen today, and pt was cleared by MD to resume heparin at midnight 11/30. INR remains elevated this morning at 2.16. Given recent bleeding issues and concurrent hepatic coagulopathy, will lower heparin level goal from 0.3-0.7 to 0.2-0.5. Heparin level this morning was therapeutic at 0.3, Hgb down slightly to 10.6, plt remain stable.  Goal of Therapy:  Heparin level 0.2-0.5 units/ml Monitor platelets by anticoagulation protocol: Yes    Plan:  -Continue heparin at 1650 units/hr -Follow-up 6-hr confirmatory heparin level -Monitor CBC and S/Sx of bleeding closely  ADDENDUM: Confirmatory heparin level remains therapeutic at 0.47 but has increased quickly from 0.30 this morning.   Plan: -Reduce heparin to 1600 units/hr -Recheck heparin level in the morning  Fredonia HighlandMichael Goldy Calandra, PharmD PGY-1 Pharmacy Resident Pager: [phone] 10/25/2016

## 2016-10-25 NOTE — Progress Notes (Signed)
[facility] for Infectious Disease    Date of Admission:  10/11/2016   Total days of antibiotics 11        Day 11 cefazolin                   ID: Parker Johnson is a 50 y.o. male  wwith NICM admitted for severe cardiogenic shock requiring vasopressors, inotropes, IABP, developing AKI, now on dialysiswho has had multiple lines placed out of necessity including PICC, trialysis line, swann-ganz, arterial lineand IABP to treat cardiogenic shock now complicated with MSSA bacteremia. Removed IABP on 11/24, removal of swann on 11/25 Active Problems:   Ventricular fibrillation (HCC)   Cardiogenic shock (HCC)   AKI (acute kidney injury) ([place])   Septic shock (HCC)   Staphylococcus aureus bacteremia   Atrial tachycardia (HCC)   Fever and chills   Syncope and collapse   Infected defibrillator (HCC)   Acute renal failure (HCC)   Central line infection    Subjective: Afebrile, he is happy to be able to ambulate  Medications:  . amiodarone  400 mg Oral BID  .  ceFAZolin (ANCEF) IV  2 g Intravenous to XRAY  .  ceFAZolin (ANCEF) IV  2 g Intravenous Q12H  . insulin aspart  0-5 Units Subcutaneous QHS  . insulin aspart  0-9 Units Subcutaneous TID WC  . midodrine  10 mg Oral TID WC  . senna-docusate  1 tablet Oral Daily  . sodium chloride flush  10-40 mL Intracatheter Q12H    Objective: Vital signs in last 24 hours: Temp:  [97.5 F (36.4 C)-98.1 F (36.7 C)] 97.9 F (36.6 C) (11/30 1600) Pulse Rate:  [46-125] 93 (11/30 1800) Resp:  [15-38] 23 (11/30 1800) BP: (64-100)/(38-86) 91/72 (11/30 1800) SpO2:  [86 %-100 %] 96 % (11/30 1800) Weight:  [150 lb 5.7 oz (68.2 kg)] 150 lb 5.7 oz (68.2 kg) (11/30 0630) Physical Exam  Constitutional: He is oriented to person, place, and time. He appears well-developed and well-nourished. No distress.  HENT:  Mouth/Throat: Oropharynx is clear and moist. No oropharyngeal exudate.  Cardiovascular: Normal rate, regular rhythm and normal heart  sounds. +S3 noted.  no gallop and no friction rub.  Pulmonary/Chest: Effort normal and breath sounds normal. No respiratory distress. He has no wheezes.  Abdominal: Soft. Bowel sounds are normal. He exhibits no distension. There is no tenderness. Right fem Hd line Neurological: He is alert and oriented to person, place, and time.  Ext: right picc line Psychiatric: He has a normal mood and affect. His behavior is normal.     Lab Results  Recent Labs  10/24/16 0547  10/25/16 0600 10/25/16 1644  WBC 11.2*  --  11.4*  --   HGB 11.5*  --  10.6*  --   HCT 31.7*  --  29.6*  --   NA  --   < > 132* 132*  K  --   < > 4.4 4.5  CL  --   < > 98* 97*  CO2  --   < > 25 25  BUN  --   < > 9 8  CREATININE  --   < > 1.28* 1.35*  < > = values in this interval not displayed. Liver Panel  Recent Labs  10/24/16 0547  10/25/16 0600 10/25/16 1644  PROT 7.2  --  7.3  --   ALBUMIN 2.5*  < > 2.6*  2.6* 2.7*  AST 23  --  27  --  ALT 6*  --  9*  --   ALKPHOS 80  --  75  --   BILITOT 19.5*  --  19.6*  --   BILIDIR 14.1*  --  12.9*  --   IBILI 5.4*  --  6.7*  --   < > = values in this interval not displayed.  Microbiology: 11/21 blood cx ngtd 11/19 blood cx MSSA Studies/Results: Ir Fluoro Guide Cv Line Right  Result Date: 10/24/2016 INDICATION: 50 year old male with acute renal failure in need of tunneled hemodialysis access. EXAM: TUNNELED CENTRAL VENOUS HEMODIALYSIS CATHETER PLACEMENT WITH ULTRASOUND AND FLUOROSCOPIC GUIDANCE MEDICATIONS: 2 g Ancef . The antibiotic was given in an appropriate time interval prior to skin puncture. ANESTHESIA/SEDATION: 1 g Versed said was administered for anxiolysis FLUOROSCOPY TIME:  Fluoroscopy Time: 0 minutes 12 seconds (0.9 mGy). COMPLICATIONS: None immediate. PROCEDURE: Informed written consent was obtained from the patient after a discussion of the risks, benefits, and alternatives to treatment. Questions regarding the procedure were encouraged and  answered. The right neck and chest were prepped with chlorhexidine in a sterile fashion, and a sterile drape was applied covering the operative field. Maximum barrier sterile technique with sterile gowns and gloves were used for the procedure. A timeout was performed prior to the initiation of the procedure. After creating a small venotomy incision, a micropuncture kit was utilized to access the right internal jugular vein under direct, real-time ultrasound guidance after the overlying soft tissues were anesthetized with 1% lidocaine with epinephrine. Ultrasound image documentation was performed. The microwire was kinked to measure appropriate catheter length. A stiff Glidewire was advanced to the level of the IVC and the micropuncture sheath was exchanged for a peel-away sheath. A Palindrome tunneled hemodialysis catheter measuring 23 cm from tip to cuff was tunneled in a retrograde fashion from the anterior chest wall to the venotomy incision. The catheter was then placed through the peel-away sheath with tips ultimately positioned within the superior aspect of the right atrium. Final catheter positioning was confirmed and documented with a spot radiographic image. The catheter aspirates and flushes normally. The catheter was flushed with appropriate volume heparin dwells. The catheter exit site was secured with a 0-Prolene retention suture. The venotomy incision was closed with derma bond. Dressings were applied. The patient tolerated the procedure well without immediate post procedural complication. IMPRESSION: Successful placement of 23 cm tip to cuff tunneled hemodialysis catheter via the right internal jugular vein with tips terminating within the superior aspect of the right atrium. The catheter is ready for immediate use. Signed, Criselda Peaches, MD [facility] Radiology Electronically Signed   By: Jacqulynn Cadet M.D.   On: 10/24/2016 17:40   Ir US  Guide Vasc Access Right  Result Date: 10/24/2016 INDICATION: 50 year old male with acute renal failure in need of tunneled hemodialysis access. EXAM: TUNNELED CENTRAL VENOUS HEMODIALYSIS CATHETER PLACEMENT WITH ULTRASOUND AND FLUOROSCOPIC GUIDANCE MEDICATIONS: 2 g Ancef . The antibiotic was given in an appropriate time interval prior to skin puncture. ANESTHESIA/SEDATION: 1 g Versed said was administered for anxiolysis FLUOROSCOPY TIME:  Fluoroscopy Time: 0 minutes 12 seconds (0.9 mGy). COMPLICATIONS: None immediate. PROCEDURE: Informed written consent was obtained from the patient after a discussion of the risks, benefits, and alternatives to treatment. Questions regarding the procedure were encouraged and answered. The right neck and chest were prepped with chlorhexidine in a sterile fashion, and a sterile drape was applied covering the operative field. Maximum barrier sterile technique with sterile gowns  and gloves were used for the procedure. A timeout was performed prior to the initiation of the procedure. After creating a small venotomy incision, a micropuncture kit was utilized to access the right internal jugular vein under direct, real-time ultrasound guidance after the overlying soft tissues were anesthetized with 1% lidocaine with epinephrine. Ultrasound image documentation was performed. The microwire was kinked to measure appropriate catheter length. A stiff Glidewire was advanced to the level of the IVC and the micropuncture sheath was exchanged for a peel-away sheath. A Palindrome tunneled hemodialysis catheter measuring 23 cm from tip to cuff was tunneled in a retrograde fashion from the anterior chest wall to the venotomy incision. The catheter was then placed through the peel-away sheath with tips ultimately positioned within the superior aspect of the right atrium. Final catheter positioning was confirmed and documented with a spot radiographic image. The catheter aspirates and flushes  normally. The catheter was flushed with appropriate volume heparin dwells. The catheter exit site was secured with a 0-Prolene retention suture. The venotomy incision was closed with derma bond. Dressings were applied. The patient tolerated the procedure well without immediate post procedural complication. IMPRESSION: Successful placement of 23 cm tip to cuff tunneled hemodialysis catheter via the right internal jugular vein with tips terminating within the superior aspect of the right atrium. The catheter is ready for immediate use. Signed, Criselda Peaches, MD [facility] Radiology Electronically Signed   By: Jacqulynn Cadet M.D.   On: 10/24/2016 17:40     Assessment/Plan: Complicated MSSA bacteremia - his only remaining line since admit is his picc line currently used for dobutamine and norepi, all other lines removed. Continue with cefazolin for 6 wk but may need chronic suppression, if he were to decide to continue course of therapy. Defer to primary team for goals of care since it appears not much more can be done in regards to his severe biventricular end stage HF.  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  Rifampin stopped due to hyperbilirubinemia. Slightly lower today  Acute on CKD now on HD = continues to be on hd but much dependent on pressor support  Will watch from [place]. Dr Johnnye Sima available for questions the next few weeks  [place], [facility] Cell: [phone] Pager: [phone]  10/25/2016, 6:14 PM

## 2016-10-26 LAB — RENAL FUNCTION PANEL
Albumin: 2.6 g/dL — ABNORMAL LOW (ref 3.5–5.0)
Anion gap: 10 (ref 5–15)
BUN: 8 mg/dL (ref 6–20)
CALCIUM: 8.7 mg/dL — AB (ref 8.9–10.3)
CO2: 24 mmol/L (ref 22–32)
CREATININE: 1.34 mg/dL — AB (ref 0.61–1.24)
Chloride: 97 mmol/L — ABNORMAL LOW (ref 101–111)
GFR calc non Af Amer: >60 mL/min (ref >60)
Glucose, Bld: 102 mg/dL — ABNORMAL HIGH (ref 65–99)
Phosphorus: 2.9 mg/dL (ref 2.5–4.6)
Potassium: 4.7 mmol/L (ref 3.5–5.1)
SODIUM: 131 mmol/L — AB (ref 135–145)

## 2016-10-26 LAB — GLUCOSE, CAPILLARY
GLUCOSE-CAPILLARY: 178 mg/dL — AB (ref 65–99)
Glucose-Capillary: 111 mg/dL — ABNORMAL HIGH (ref 65–99)
Glucose-Capillary: 95 mg/dL (ref 65–99)

## 2016-10-26 LAB — CBC
HEMATOCRIT: 30.2 % — AB (ref 39.0–52.0)
Hemoglobin: 10.4 g/dL — ABNORMAL LOW (ref 13.0–17.0)
MCH: 28.5 pg (ref 26.0–34.0)
MCHC: 34.4 g/dL (ref 30.0–36.0)
MCV: 82.7 fL (ref 78.0–100.0)
PLATELETS: 131 10*3/uL — AB (ref 150–400)
RBC: 3.65 MIL/uL — ABNORMAL LOW (ref 4.22–5.81)
RDW: 22.7 % — AB (ref 11.5–15.5)
WBC: 17.2 10*3/uL — AB (ref 4.0–10.5)

## 2016-10-26 LAB — HEPATIC FUNCTION PANEL
ALK PHOS: 74 U/L (ref 38–126)
ALT: 6 U/L — AB (ref 17–63)
AST: 35 U/L (ref 15–41)
Albumin: 2.8 g/dL — ABNORMAL LOW (ref 3.5–5.0)
BILIRUBIN DIRECT: 12.8 mg/dL — AB (ref 0.1–0.5)
BILIRUBIN INDIRECT: 7.2 mg/dL — AB (ref 0.3–0.9)
BILIRUBIN TOTAL: 20 mg/dL — AB (ref 0.3–1.2)
Total Protein: 7.6 g/dL (ref 6.5–8.1)

## 2016-10-26 LAB — PROTIME-INR
INR: 2.7
Prothrombin Time: 29.3 seconds — ABNORMAL HIGH (ref 11.4–15.2)

## 2016-10-26 LAB — COOXEMETRY PANEL
CARBOXYHEMOGLOBIN: 1.9 % — AB (ref 0.5–1.5)
METHEMOGLOBIN: 1 % (ref 0.0–1.5)
O2 Saturation: 29.9 %
TOTAL HEMOGLOBIN: 10.3 g/dL — AB (ref 12.0–16.0)

## 2016-10-26 LAB — HEPARIN LEVEL (UNFRACTIONATED): Heparin Unfractionated: 1.07 IU/mL — ABNORMAL HIGH (ref 0.30–0.70)

## 2016-10-26 LAB — MAGNESIUM: Magnesium: 2.5 mg/dL — ABNORMAL HIGH (ref 1.7–2.4)

## 2016-10-26 LAB — APTT: aPTT: 187 seconds (ref 24–36)

## 2016-10-26 MED ORDER — "THROMBI-PAD 3""X3"" EX PADS"
1.0000 | MEDICATED_PAD | Freq: Once | CUTANEOUS | Status: AC
  Administered 2016-10-26: 1 via TOPICAL
  Filled 2016-10-26: qty 1

## 2016-10-26 NOTE — Evaluation (Signed)
Physical Therapy Evaluation Patient Details Name: DENIS COCH MRN: [id number] DOB: 1966-06-08 Today's Date: 10/26/2016   History of Present Illness  50 y.o. male  wwith NICM admitted for severe cardiogenic shock requiring vasopressors, inotropes, IABP, developing AKI, now on dialysis  Clinical Impression  Patient seen for mobility assessment in room with nsg assist due to continued HD CVVHD. At this time, patient with no difficulty performing transfers and dynamic tasks. Educated patient on HEP and encouraged mobility with staff. Discussed case with nsg. At this time, will defer further acute PT unless noted need arises as patient shows good strength and mobility with both PT and nsg. Will sign off, please re consult should patient needs change.    Follow Up Recommendations Supervision/Assistance - 24 hour    Equipment Recommendations  None recommended by PT    Recommendations for Other Services       Precautions / Restrictions Precautions Precautions: Fall Restrictions Weight Bearing Restrictions: No      Mobility  Bed Mobility               General bed mobility comments: received in recliner  Transfers Overall transfer level: Modified independent               General transfer comment: no physical assist required at this time  Ambulation/Gait Ambulation/Gait assistance: Independent Ambulation Distance (Feet): 3 Feet         General Gait Details: limited steps to assess while on CVVHD  Stairs            Wheelchair Mobility    Modified Rankin (Stroke Patients Only)       Balance                                             Pertinent Vitals/Pain Pain Assessment: No/denies pain    Home Living Family/patient expects to be discharged to:: Private residence Living Arrangements: Spouse/significant other Available Help at Discharge: Family Type of Home: House Home Access: Stairs to enter Entrance Stairs-Rails: Can reach  both Entrance Stairs-Number of Steps: 5 Home Layout: One level Home Equipment: None      Prior Function Level of Independence: Independent               Hand Dominance   Dominant Hand: Right    Extremity/Trunk Assessment   Upper Extremity Assessment: Overall WFL for tasks assessed           Lower Extremity Assessment: Overall WFL for tasks assessed         Communication   Communication: No difficulties  Cognition Arousal/Alertness: Awake/alert Behavior During Therapy: WFL for tasks assessed/performed Overall Cognitive Status: No family/caregiver present to determine baseline cognitive functioning                      General Comments General comments (skin integrity, edema, etc.): educated patient extensively on in room LE therapeutic exercise program    Exercises     Assessment/Plan    PT Assessment Patent does not need any further PT services  PT Problem List            PT Treatment Interventions      PT Goals (Current goals can be found in the Care Plan section)  Acute Rehab PT Goals PT Goal Formulation: All assessment and education complete, DC therapy    Frequency  Barriers to discharge        Co-evaluation               End of Session   Activity Tolerance: Patient tolerated treatment well Patient left: in chair;with call bell/phone within reach Nurse Communication: Mobility status         Time: 1421-1440 PT Time Calculation (min) (ACUTE ONLY): 19 min   Charges:   PT Evaluation $PT Eval Moderate Complexity: 1 Procedure     PT G Codes:        Fabio AsaDevon J Cleston Lautner 10/26/2016, 4:47 PM Charlotte Crumbevon Michall Noffke, PT DPT  [phone]

## 2016-10-26 NOTE — Progress Notes (Signed)
ANTICOAGULATION CONSULT NOTE - Follow Up Consult ANTIBIOTIC CONSULT NOTE - Follow Up Consult  Pharmacy Consult for Heparin and Cefazolin Indication: atrial fibrillation and MSSA bacteremia  No Known Allergies  Patient Measurements: Height: 5\' 7"  (170.2 cm) Weight: 154 lb 1.6 oz (69.9 kg) IBW/kg (Calculated) : 66.1  Vital Signs: Temp: 97.3 F (36.3 C) (12/01 0750) Temp Source: Oral (12/01 0750) BP: 83/64 (12/01 0900) Pulse Rate: 92 (12/01 0900)  Labs:  Recent Labs  10/24/16 0547 10/24/16 1331 10/24/16 1555 10/25/16 0600 10/25/16 1225 10/25/16 1644 10/26/16 0500  HGB 11.5*  --   --  10.6*  --   --  10.4*  HCT 31.7*  --   --  29.6*  --   --  30.2*  PLT 142*  --   --  142*  --   --  131*  APTT 197*  --   --  118*  --   --  187*  LABPROT 26.0* 24.6*  --  24.4*  --   --  29.3*  INR 2.33 2.17  --  2.16  --   --  2.70  HEPARINUNFRC 0.54  --   --  0.30 0.47  --  1.07*  CREATININE  --   --  1.17 1.28*  --  1.35*  --     Estimated Creatinine Clearance: 61.2 mL/min (by C-G formula based on SCr of 1.35 mg/dL (H)).  Assessment: 50yom resumed on heparin for afib on 11/30. Heparin level this morning was supratherapeutic at 1.07. Patient oozing from HD cath site and INR up to 2.7. Per Dr. Gala Romney, will hold heparin altogether for now.  He also continues on day #13 cefazolin for MSSA bacteremia. Dose appropriate for CRRT.  Vancomycin 11/19>11/19 Ceftazidime 11/19>11/19 Cefazolin 11/19>> Rifampin 11/22>11/29  11/21 BCx: negative final 11/19 BCx: 2/2 MSSA (BCID confirms) 11/15 MRSA PCR: negative  Goal of Therapy:  Heparin level 0.2-0.5 units/ml Monitor platelets by anticoagulation protocol: Yes   Plan:  1) Hold heparin for now 2) Continue cefazolin 2g IV q12 w/ CRRT  Fredrik Rigger 10/26/2016,9:55 AM

## 2016-10-26 NOTE — Progress Notes (Signed)
Subjective: Interval History: up in chair  Objective: Vital signs in last 24 hours: Temp:  [97.4 F (36.3 C)-98.1 F (36.7 C)] 97.6 F (36.4 C) (12/01 0416) Pulse Rate:  [46-125] 90 (12/01 0700) Resp:  [16-27] 21 (12/01 0700) BP: (79-100)/(59-86) 96/71 (12/01 0700) SpO2:  [86 %-100 %] 94 % (12/01 0700) FiO2 (%):  [2 %] 2 % (12/01 0300) Weight:  [69.9 kg (154 lb 1.6 oz)] 69.9 kg (154 lb 1.6 oz) (12/01 0600) Weight change: 1.699 kg (3 lb 11.9 oz)  Intake/Output from previous day: 11/30 0701 - 12/01 0700 In: 1974.4 [P.O.:675; I.V.:1099.4; IV Piggyback:200] Out: 2351  Intake/Output this shift: No intake/output data recorded.  General appearance: alert, cooperative and no distress Resp: rales bibasilar Chest wall: RIJ PC Cardio: S1, S2 normal and systolic murmur: holosystolic 2/6, blowing at lower left sternal border GI: liver down10cm Extremities: edema 2+  Lab Results:  Recent Labs  10/25/16 0600 10/26/16 0500  WBC 11.4* 17.2*  HGB 10.6* 10.4*  HCT 29.6* 30.2*  PLT 142* 131*   BMET:  Recent Labs  10/25/16 0600 10/25/16 1644  NA 132* 132*  K 4.4 4.5  CL 98* 97*  CO2 25 25  GLUCOSE 96 121*  BUN 9 8  CREATININE 1.28* 1.35*  CALCIUM 8.6* 8.6*   No results for input(s): PTH in the last 72 hours. Iron Studies: No results for input(s): IRON, TIBC, TRANSFERRIN, FERRITIN in the last 72 hours.  Studies/Results: Ir Fluoro Guide Cv Line Right  Result Date: 10/24/2016 INDICATION: 50 year old male with acute renal failure in need of tunneled hemodialysis access. EXAM: TUNNELED CENTRAL VENOUS HEMODIALYSIS CATHETER PLACEMENT WITH ULTRASOUND AND FLUOROSCOPIC GUIDANCE MEDICATIONS: 2 g Ancef . The antibiotic was given in an appropriate time interval prior to skin puncture. ANESTHESIA/SEDATION: 1 g Versed said was administered for anxiolysis FLUOROSCOPY TIME:  Fluoroscopy Time: 0 minutes 12 seconds (0.9 mGy). COMPLICATIONS: None immediate. PROCEDURE: Informed written consent  was obtained from the patient after a discussion of the risks, benefits, and alternatives to treatment. Questions regarding the procedure were encouraged and answered. The right neck and chest were prepped with chlorhexidine in a sterile fashion, and a sterile drape was applied covering the operative field. Maximum barrier sterile technique with sterile gowns and gloves were used for the procedure. A timeout was performed prior to the initiation of the procedure. After creating a small venotomy incision, a micropuncture kit was utilized to access the right internal jugular vein under direct, real-time ultrasound guidance after the overlying soft tissues were anesthetized with 1% lidocaine with epinephrine. Ultrasound image documentation was performed. The microwire was kinked to measure appropriate catheter length. A stiff Glidewire was advanced to the level of the IVC and the micropuncture sheath was exchanged for a peel-away sheath. A Palindrome tunneled hemodialysis catheter measuring 23 cm from tip to cuff was tunneled in a retrograde fashion from the anterior chest wall to the venotomy incision. The catheter was then placed through the peel-away sheath with tips ultimately positioned within the superior aspect of the right atrium. Final catheter positioning was confirmed and documented with a spot radiographic image. The catheter aspirates and flushes normally. The catheter was flushed with appropriate volume heparin dwells. The catheter exit site was secured with a 0-Prolene retention suture. The venotomy incision was closed with derma bond. Dressings were applied. The patient tolerated the procedure well without immediate post procedural complication. IMPRESSION: Successful placement of 23 cm tip to cuff tunneled hemodialysis catheter via the right internal jugular vein with  tips terminating within the superior aspect of the right atrium. The catheter is ready for immediate use. Signed, Criselda Peaches,  MD [facility] Radiology Electronically Signed   By: Jacqulynn Cadet M.D.   On: 10/24/2016 17:40   Ir US Guide Vasc Access Right  Result Date: 10/24/2016 INDICATION: 50 year old male with acute renal failure in need of tunneled hemodialysis access. EXAM: TUNNELED CENTRAL VENOUS HEMODIALYSIS CATHETER PLACEMENT WITH ULTRASOUND AND FLUOROSCOPIC GUIDANCE MEDICATIONS: 2 g Ancef . The antibiotic was given in an appropriate time interval prior to skin puncture. ANESTHESIA/SEDATION: 1 g Versed said was administered for anxiolysis FLUOROSCOPY TIME:  Fluoroscopy Time: 0 minutes 12 seconds (0.9 mGy). COMPLICATIONS: None immediate. PROCEDURE: Informed written consent was obtained from the patient after a discussion of the risks, benefits, and alternatives to treatment. Questions regarding the procedure were encouraged and answered. The right neck and chest were prepped with chlorhexidine in a sterile fashion, and a sterile drape was applied covering the operative field. Maximum barrier sterile technique with sterile gowns and gloves were used for the procedure. A timeout was performed prior to the initiation of the procedure. After creating a small venotomy incision, a micropuncture kit was utilized to access the right internal jugular vein under direct, real-time ultrasound guidance after the overlying soft tissues were anesthetized with 1% lidocaine with epinephrine. Ultrasound image documentation was performed. The microwire was kinked to measure appropriate catheter length. A stiff Glidewire was advanced to the level of the IVC and the micropuncture sheath was exchanged for a peel-away sheath. A Palindrome tunneled hemodialysis catheter measuring 23 cm from tip to cuff was tunneled in a retrograde fashion from the anterior chest wall to the venotomy incision. The catheter was then placed through the peel-away sheath with tips ultimately positioned within the superior  aspect of the right atrium. Final catheter positioning was confirmed and documented with a spot radiographic image. The catheter aspirates and flushes normally. The catheter was flushed with appropriate volume heparin dwells. The catheter exit site was secured with a 0-Prolene retention suture. The venotomy incision was closed with derma bond. Dressings were applied. The patient tolerated the procedure well without immediate post procedural complication. IMPRESSION: Successful placement of 23 cm tip to cuff tunneled hemodialysis catheter via the right internal jugular vein with tips terminating within the superior aspect of the right atrium. The catheter is ready for immediate use. Signed, Criselda Peaches, MD [facility] Radiology Electronically Signed   By: Jacqulynn Cadet M.D.   On: 10/24/2016 17:40    I have reviewed the patient's current medications.  Assessment/Plan: 1 AKI oliguric aTN.  CRRT going well . Keeping even. Will try small net neg and see if improves  2 low bp taper NE 3 CM severe med and CRRT dependent 4 Anemia 5 coagulopathy P net neg, DB, NE,    LOS: 16 days   Nataliah Hatlestad,Parker Johnson 10/26/2016,7:47 AM

## 2016-10-26 NOTE — Progress Notes (Addendum)
Patient ID: Parker Johnson, male   DOB: 1966/09/25, 50 y.o.   MRN: [id number]    Advanced Heart Failure Rounding Note   Subjective:    Admitted with VT/syncope and profound cardiogenic shock. PICC placed with initial CO-OX 26%.   Underwent IABP and Trialysis cath placement on 11/17  On 11/19 developed sepsis. Bcx + for MSSA. Now on Ancef. F/u cultures negative.  IABP pulled 11/24. Swan out 11/25  Now on dobutamine 10 and norepi 18 mcg. Remains in ?atrial tach with CHB  Co-ox 30%  CVP up 25. CVVHD now keeping even. Not making urine. Weight down 30 pounds. Off rifampin with elevated bilirubin. Able to get up OOB to the chair.    Denies SOB.    INR 2.7. Heparin off. Oozing from back on.   F/u bcx are negative.   Objective:   Weight Range:  Vital Signs:   Temp:  [97.3 F (36.3 C)-98.1 F (36.7 C)] 97.3 F (36.3 C) (12/01 0750) Pulse Rate:  [46-102] 88 (12/01 0800) Resp:  [16-27] 22 (12/01 0800) BP: (79-100)/(59-86) 89/73 (12/01 0800) SpO2:  [86 %-100 %] 96 % (12/01 0800) FiO2 (%):  [2 %] 2 % (12/01 0300) Weight:  [154 lb 1.6 oz (69.9 kg)] 154 lb 1.6 oz (69.9 kg) (12/01 0600) Last BM Date: 10/25/16  Weight change: Filed Weights   10/24/16 0500 10/25/16 0630 10/26/16 0600  Weight: 147 lb 4.3 oz (66.8 kg) 150 lb 5.7 oz (68.2 kg) 154 lb 1.6 oz (69.9 kg)    Intake/Output:   Intake/Output Summary (Last 24 hours) at 10/26/16 0804 Last data filed at 10/26/16 0800  Gross per 24 hour  Intake          1854.71 ml  Output             2395 ml  Net          -540.29 ml     Physical Exam: CVP 25  General:  In the chair. NAD HEENT: Normal except sclera icterus Neck: supple. JVP remains elevated to ear. RIJ cordis . Carotids 2+ bilat; no bruits. No thyromegaly or nodule noted.  Cor: PMI nondisplaced. RRR. No murmurs or rubs. +S3 Lungs:clear    Abdomen: soft, NT, ND, no HSM. No bruits or masses. +BS  Extremities: no cyanosis, clubbing, rash  RUE PICC. No edema. Ted hose  present. R femoral trialysis cath.  Neuro: awake. Moves all 4 extremities w/o difficulty.   Telemetry: Reviewed, atrial tach with CHB  Labs: Basic Metabolic Panel:  Recent Labs Lab 10/20/16 0430  10/21/16 0640  10/22/16 0806  10/23/16 0430 10/23/16 1533 10/24/16 0546 10/24/16 0547 10/24/16 1555 10/25/16 0600 10/25/16 1644  NA 129*  < > 129*  < > 130*  < > 131* 134* 132*  --  135 132* 132*  K 3.9  < > 4.0  < > 4.0  < > 4.2 4.2 4.2  --  4.3 4.4 4.5  CL 95*  < > 97*  < > 99*  < > 97* 100* 98*  --  99* 98* 97*  CO2 22  < > 22  < > 22  < > _0 --  _1 GLUCOSE 132*  < > 203*  < > 151*  < > 121* 120* 101*  --  107* 96 121*  BUN 16  < > 14  < > 13  < > _2 --  _3 CREATININE 1.86*  < >  1.84*  < > 1.57*  < > 1.47* 0.91 1.27*  --  1.17 1.28* 1.35*  CALCIUM 8.2*  < > 8.2*  < > 8.5*  < > 8.7* 8.7* 8.5*  --  8.7* 8.6* 8.6*  MG 2.2  --  2.3  --  2.3  --  2.4  --   --  2.5*  --   --   --   PHOS 2.4*  < > 2.1*  < > 1.9*  < > 2.4* 2.7 2.4*  --  3.9 2.6 2.8  < > = values in this interval not displayed.  Liver Function Tests:  Recent Labs Lab 10/23/16 1315  10/24/16 0546 10/24/16 0547 10/24/16 1555 10/25/16 0600 10/25/16 1644  AST 27  --   --  23  --  27  --   ALT 7*  --   --  6*  --  9*  --   ALKPHOS 88  --   --  80  --  75  --   BILITOT 21.6*  --   --  19.5*  --  19.6*  --   PROT 8.3*  --   --  7.2  --  7.3  --   ALBUMIN 3.0*  < > 2.5* 2.5* 2.8* 2.6*  2.6* 2.7*  < > = values in this interval not displayed. No results for input(s): LIPASE, AMYLASE in the last 168 hours.  Recent Labs Lab 10/25/16 1120  AMMONIA 36*    CBC:  Recent Labs Lab 10/22/16 0540 10/23/16 0430 10/24/16 0547 10/25/16 0600 10/26/16 0500  WBC 8.5 9.5 11.2* 11.4* 17.2*  HGB 12.4* 12.0* 11.5* 10.6* 10.4*  HCT 34.1* 33.0* 31.7* 29.6* 30.2*  MCV 78.8 77.1* 79.1 80.7 82.7  PLT 108* 124* 142* 142* 131*    Cardiac Enzymes: No results for input(s): CKTOTAL, CKMB, CKMBINDEX,  TROPONINI in the last 168 hours.  BNP: BNP (last 3 results)  Recent Labs  05/16/16 0420 06/21/16 1218 10/22/2016 0957  BNP 2,453.6* 2,504.3* 3,542.3*    ProBNP (last 3 results) No results for input(s): PROBNP in the last 8760 hours.    Other results:  Imaging: Ir Fluoro Guide Cv Line Right  Result Date: 10/24/2016 INDICATION: 50 year old male with acute renal failure in need of tunneled hemodialysis access. EXAM: TUNNELED CENTRAL VENOUS HEMODIALYSIS CATHETER PLACEMENT WITH ULTRASOUND AND FLUOROSCOPIC GUIDANCE MEDICATIONS: 2 g Ancef . The antibiotic was given in an appropriate time interval prior to skin puncture. ANESTHESIA/SEDATION: 1 g Versed said was administered for anxiolysis FLUOROSCOPY TIME:  Fluoroscopy Time: 0 minutes 12 seconds (0.9 mGy). COMPLICATIONS: None immediate. PROCEDURE: Informed written consent was obtained from the patient after a discussion of the risks, benefits, and alternatives to treatment. Questions regarding the procedure were encouraged and answered. The right neck and chest were prepped with chlorhexidine in a sterile fashion, and a sterile drape was applied covering the operative field. Maximum barrier sterile technique with sterile gowns and gloves were used for the procedure. A timeout was performed prior to the initiation of the procedure. After creating a small venotomy incision, a micropuncture kit was utilized to access the right internal jugular vein under direct, real-time ultrasound guidance after the overlying soft tissues were anesthetized with 1% lidocaine with epinephrine. Ultrasound image documentation was performed. The microwire was kinked to measure appropriate catheter length. A stiff Glidewire was advanced to the level of the IVC and the micropuncture sheath was exchanged for a peel-away sheath. A Palindrome tunneled hemodialysis catheter measuring 23 cm  from tip to cuff was tunneled in a retrograde fashion from the anterior chest wall to the  venotomy incision. The catheter was then placed through the peel-away sheath with tips ultimately positioned within the superior aspect of the right atrium. Final catheter positioning was confirmed and documented with a spot radiographic image. The catheter aspirates and flushes normally. The catheter was flushed with appropriate volume heparin dwells. The catheter exit site was secured with a 0-Prolene retention suture. The venotomy incision was closed with derma bond. Dressings were applied. The patient tolerated the procedure well without immediate post procedural complication. IMPRESSION: Successful placement of 23 cm tip to cuff tunneled hemodialysis catheter via the right internal jugular vein with tips terminating within the superior aspect of the right atrium. The catheter is ready for immediate use. Signed, Criselda Peaches, MD [facility] Radiology Electronically Signed   By: Jacqulynn Cadet M.D.   On: 10/24/2016 17:40   Ir US Guide Vasc Access Right  Result Date: 10/24/2016 INDICATION: 50 year old male with acute renal failure in need of tunneled hemodialysis access. EXAM: TUNNELED CENTRAL VENOUS HEMODIALYSIS CATHETER PLACEMENT WITH ULTRASOUND AND FLUOROSCOPIC GUIDANCE MEDICATIONS: 2 g Ancef . The antibiotic was given in an appropriate time interval prior to skin puncture. ANESTHESIA/SEDATION: 1 g Versed said was administered for anxiolysis FLUOROSCOPY TIME:  Fluoroscopy Time: 0 minutes 12 seconds (0.9 mGy). COMPLICATIONS: None immediate. PROCEDURE: Informed written consent was obtained from the patient after a discussion of the risks, benefits, and alternatives to treatment. Questions regarding the procedure were encouraged and answered. The right neck and chest were prepped with chlorhexidine in a sterile fashion, and a sterile drape was applied covering the operative field. Maximum barrier sterile technique with sterile gowns and gloves were  used for the procedure. A timeout was performed prior to the initiation of the procedure. After creating a small venotomy incision, a micropuncture kit was utilized to access the right internal jugular vein under direct, real-time ultrasound guidance after the overlying soft tissues were anesthetized with 1% lidocaine with epinephrine. Ultrasound image documentation was performed. The microwire was kinked to measure appropriate catheter length. A stiff Glidewire was advanced to the level of the IVC and the micropuncture sheath was exchanged for a peel-away sheath. A Palindrome tunneled hemodialysis catheter measuring 23 cm from tip to cuff was tunneled in a retrograde fashion from the anterior chest wall to the venotomy incision. The catheter was then placed through the peel-away sheath with tips ultimately positioned within the superior aspect of the right atrium. Final catheter positioning was confirmed and documented with a spot radiographic image. The catheter aspirates and flushes normally. The catheter was flushed with appropriate volume heparin dwells. The catheter exit site was secured with a 0-Prolene retention suture. The venotomy incision was closed with derma bond. Dressings were applied. The patient tolerated the procedure well without immediate post procedural complication. IMPRESSION: Successful placement of 23 cm tip to cuff tunneled hemodialysis catheter via the right internal jugular vein with tips terminating within the superior aspect of the right atrium. The catheter is ready for immediate use. Signed, Criselda Peaches, MD [facility] Radiology Electronically Signed   By: Jacqulynn Cadet M.D.   On: 10/24/2016 17:40     Medications:     Scheduled Medications: . amiodarone  400 mg Oral BID  .  ceFAZolin (ANCEF) IV  2 g Intravenous Q12H  . insulin aspart  0-5 Units Subcutaneous QHS  . insulin aspart  0-9 Units Subcutaneous TID WC  .  midodrine  10 mg Oral TID WC  . senna-docusate  1 tablet Oral Daily  . sodium chloride flush  10-40 mL Intracatheter Q12H    Infusions: . sodium chloride Stopped (10/23/16 0730)  . DOBUTamine 10 mcg/kg/min (10/25/16 1746)  . norepinephrine (LEVOPHED) Adult infusion 18 mcg/min (10/26/16 8527)  . dialysis replacement fluid (prismasate) 700 mL/hr at 10/26/16 0355  . dialysis replacement fluid (prismasate) 700 mL/hr at 10/26/16 0354  . dialysate (PRISMASATE) 1,500 mL/hr at 10/26/16 0719    PRN Medications: acetaminophen, bisacodyl, heparin, heparin, magnesium hydroxide, ondansetron (ZOFRAN) IV, sodium chloride, sodium chloride, sodium chloride flush, traMADol   Assessment/Plan/Discussion    1. 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  NICM.  - ECHO this admission EF 10% with severe RV dilation and dysfunction. - He is down 36 pounds with CVVHD. Still anuric. - Co-ox 29%. Norepi increased to 18 mcg and remains on dobutamine 10 mcg.  Continue midodrine 10 mg three times a day.   - CVP up to 25.  Not sure we can get much better with severe RV dysfunction - Not candidate for VAD or transplant so have not pursued ECMO (Would need heart kidney. Had ongoing tobacco use on admit and last cocaine use about 3 months ago. Blood type B+). - Main issue now is whether or not kidneys will recover and how long we should continue CVVHD. With inotrope dependence and severe HF, not candidate for iHD. Would like to give it at least another week.  2. Acute on CKD III:  Anuric. Now on CVVHD (started 11/17).  3. PAF/atrial tach:  Had transient CHB, now looks like atrial tachycardia with 2:1 block. Will consider overdrive pacing, EP following. Per EP not a candidate for ICD explant.  4. H/O LV thrombus 5. H/o polysubstance abuse (ETOH, cocaine, tobacco use) 6. Hyperkalemia: Resolved CVVH.  9. MSSA sepsis  - Now on Ancef and rifampin. With CHB will proceed with  echo to look for endocarditis though f/u cx are negative. May need TEE if/when more stable 10. Syncope: VT [employer].  Device interrogation showed multiple episodes of VT. Loading amio per EP. K repleted. No further VT over the last few days. EP following --On po amio 11. Melena-small amount reported. Check FOBT. Hgb stable 10.4  12. Hyperbilirubinemia-  Total bilirubin 20.Marland Kitchen Rifampin and Amio potential causative agents. Rifampin stopped.   Length of Stay: [place], NP-C  10/26/2016, 8:04 AM  Advanced Heart Failure Team Pager [phone] (M-F; 7a - 4p)  Please contact [facility] for night-coverage after hours (4p -7a ) and weekends on [email or web address]  Patient seen and examined with Darrick Grinder, NP. We discussed all aspects of the encounter. I agree with the assessment and plan as stated above.   He continues to deteriorate with multi-system organ failure. He is terminally ill. Remains anuric with low co-ox despite dobutamine 10 and norepi 20. We have run out of options. Will wean norepi slowly with plans not to escalate. I discussed this with him again but he continue to be inappropriately optimistic for recovery. Will continue to support the best we can. He is not candidate for advanced therapies.   The patient is critically ill with multiple organ systems failure and requires high complexity decision making for assessment and support, frequent evaluation and titration of therapies, application of advanced monitoring technologies and extensive interpretation of multiple databases.   Critical Care Time devoted to  patient care services described in this note is 35 Minutes.  Ever Halberg,MD 3:49 PM

## 2016-10-26 NOTE — Progress Notes (Addendum)
PT Cancellation Note  Patient Details Name: Parker Johnson MRN: [id number] DOB: 1966-11-08   Cancelled Treatment:    Reason Eval/Treat Not Completed: Patient at procedure or test/unavailable, off the floor and currently with active bedrest orders at this time. Will await updated activity.   Fabio Asa 10/26/2016, 7:19 AM Charlotte Crumb, PT DPT  [phone]

## 2016-10-26 DEATH — deceased

## 2016-10-27 LAB — COOXEMETRY PANEL
CARBOXYHEMOGLOBIN: 2.5 % — AB (ref 0.5–1.5)
METHEMOGLOBIN: 1 % (ref 0.0–1.5)
O2 Saturation: 50.3 %
Total hemoglobin: 9.7 g/dL — ABNORMAL LOW (ref 12.0–16.0)

## 2016-10-27 LAB — CBC
HEMATOCRIT: 29.4 % — AB (ref 39.0–52.0)
Hemoglobin: 10.2 g/dL — ABNORMAL LOW (ref 13.0–17.0)
MCH: 29 pg (ref 26.0–34.0)
MCHC: 34.7 g/dL (ref 30.0–36.0)
MCV: 83.5 fL (ref 78.0–100.0)
PLATELETS: 132 10*3/uL — AB (ref 150–400)
RBC: 3.52 MIL/uL — ABNORMAL LOW (ref 4.22–5.81)
RDW: 23.8 % — AB (ref 11.5–15.5)
WBC: 13.3 10*3/uL — AB (ref 4.0–10.5)

## 2016-10-27 LAB — HEPATIC FUNCTION PANEL
ALBUMIN: 2.5 g/dL — AB (ref 3.5–5.0)
ALK PHOS: 74 U/L (ref 38–126)
ALT: 8 U/L — AB (ref 17–63)
AST: 39 U/L (ref 15–41)
BILIRUBIN DIRECT: 9.5 mg/dL — AB (ref 0.1–0.5)
BILIRUBIN TOTAL: 18.3 mg/dL — AB (ref 0.3–1.2)
Indirect Bilirubin: 8.8 mg/dL — ABNORMAL HIGH (ref 0.3–0.9)
Total Protein: 7.5 g/dL (ref 6.5–8.1)

## 2016-10-27 LAB — RENAL FUNCTION PANEL
ANION GAP: 9 (ref 5–15)
Albumin: 2.5 g/dL — ABNORMAL LOW (ref 3.5–5.0)
Albumin: 2.6 g/dL — ABNORMAL LOW (ref 3.5–5.0)
Anion gap: 9 (ref 5–15)
BUN: 8 mg/dL (ref 6–20)
BUN: 9 mg/dL (ref 6–20)
CALCIUM: 8.6 mg/dL — AB (ref 8.9–10.3)
CALCIUM: 8.3 mg/dL — AB (ref 8.9–10.3)
CHLORIDE: 99 mmol/L — AB (ref 101–111)
CHLORIDE: 96 mmol/L — AB (ref 101–111)
CO2: 25 mmol/L (ref 22–32)
CO2: 25 mmol/L (ref 22–32)
CREATININE: 1.39 mg/dL — AB (ref 0.61–1.24)
Creatinine, Ser: 1.48 mg/dL — ABNORMAL HIGH (ref 0.61–1.24)
GFR calc non Af Amer: 53 mL/min — ABNORMAL LOW (ref >60)
GFR, EST NON AFRICAN AMERICAN: 58 mL/min — AB (ref >60)
GLUCOSE: 129 mg/dL — AB (ref 65–99)
Glucose, Bld: 99 mg/dL (ref 65–99)
POTASSIUM: 4.2 mmol/L (ref 3.5–5.1)
Phosphorus: 2.5 mg/dL (ref 2.5–4.6)
Phosphorus: 2.2 mg/dL — ABNORMAL LOW (ref 2.5–4.6)
Potassium: 4.4 mmol/L (ref 3.5–5.1)
SODIUM: 133 mmol/L — AB (ref 135–145)
Sodium: 130 mmol/L — ABNORMAL LOW (ref 135–145)

## 2016-10-27 LAB — GLUCOSE, CAPILLARY
Glucose-Capillary: 119 mg/dL — ABNORMAL HIGH (ref 65–99)
Glucose-Capillary: 171 mg/dL — ABNORMAL HIGH (ref 65–99)

## 2016-10-27 LAB — PROTIME-INR
INR: 2.31
Prothrombin Time: 25.8 seconds — ABNORMAL HIGH (ref 11.4–15.2)

## 2016-10-27 LAB — APTT: aPTT: 42 seconds — ABNORMAL HIGH (ref 24–36)

## 2016-10-27 LAB — MAGNESIUM: MAGNESIUM: 2.6 mg/dL — AB (ref 1.7–2.4)

## 2016-10-27 NOTE — Progress Notes (Addendum)
Patient ID: Parker Johnson, male   DOB: Feb 19, 1966, 50 y.o.   MRN: [id number]    Advanced Heart Failure Rounding Note   Subjective:    Admitted with VT/syncope and profound cardiogenic shock. PICC placed with initial CO-OX 26%.   Underwent IABP and Trialysis cath placement on 11/17  On 11/19 developed sepsis. Bcx + for MSSA. Now on Ancef. F/u cultures negative.  IABP pulled 11/24. Swan out 11/25  Now on dobutamine 10 and norepi 16 mcg. He is on midodrine 10 mg tid.  SBP 80s-90s, have not been able to wean norepinephrine any further.   Remains in atrial tach 2:1 block.    Co-ox 50%  CVP >20. CVVH now running gently negative. Not making urine. Able to get up OOB to the chair.    Denies SOB.    INR 2.3. Heparin off.   F/u bcx are negative.   Objective:   Weight Range:  Vital Signs:   Temp:  [96.7 F (35.9 C)-98.9 F (37.2 C)] 98.9 F (37.2 C) (12/02 0404) Pulse Rate:  [71-94] 93 (12/02 0637) Resp:  [17-27] 20 (12/01 1700) BP: (73-100)/(51-73) 94/68 (12/02 0600) SpO2:  [92 %-100 %] 96 % (12/02 0637) Weight:  [154 lb 1.6 oz (69.9 kg)] 154 lb 1.6 oz (69.9 kg) (12/02 0637) Last BM Date: 10/26/16  Weight change: Filed Weights   10/25/16 0630 10/26/16 0600 10/27/16 0637  Weight: 150 lb 5.7 oz (68.2 kg) 154 lb 1.6 oz (69.9 kg) 154 lb 1.6 oz (69.9 kg)    Intake/Output:   Intake/Output Summary (Last 24 hours) at 10/27/16 0727 Last data filed at 10/27/16 0700  Gross per 24 hour  Intake           1665.9 ml  Output             2487 ml  Net           -821.1 ml     Physical Exam: CVP > 20  General:  In the chair. NAD HEENT: Normal except sclera icterus Neck: supple. JVP remains elevated to ear. RIJ cordis . Carotids 2+ bilat; no bruits. No thyromegaly or nodule noted.  Cor: PMI nondisplaced. RRR. No murmurs or rubs. +S3 Lungs:clear    Abdomen: soft, NT, ND, no HSM. No bruits or masses. +BS  Extremities: no cyanosis, clubbing, rash  RUE PICC. No edema. Ted hose  present. R femoral trialysis cath.  Neuro: awake. Moves all 4 extremities w/o difficulty.   Telemetry: Reviewed, atrial tach with 2:1 block  Labs: Basic Metabolic Panel:  Recent Labs Lab 10/21/16 0640  10/22/16 0806  10/23/16 0430  10/24/16 0547 10/24/16 1555 10/25/16 0600 10/25/16 1644 10/26/16 0500 10/26/16 1623  NA 129*  < > 130*  < > 131*  < >  --  135 132* 132* 131* 132*  K 4.0  < > 4.0  < > 4.2  < >  --  4.3 4.4 4.5 4.7 4.7  CL 97*  < > 99*  < > 97*  < >  --  99* 98* 97* 97* 98*  CO2 22  < > 22  < > 25  < >  --  26 25 25 24 24   GLUCOSE 203*  < > 151*  < > 121*  < >  --  107* 96 121* 102* 94  BUN 14  < > 13  < > 12  < >  --  6 9 8 8 8   CREATININE 1.84*  < >  1.57*  < > 1.47*  < >  --  1.17 1.28* 1.35* 1.34* 1.31*  CALCIUM 8.2*  < > 8.5*  < > 8.7*  < >  --  8.7* 8.6* 8.6* 8.7* 8.8*  MG 2.3  --  2.3  --  2.4  --  2.5*  --   --   --  2.5*  --   PHOS 2.1*  < > 1.9*  < > 2.4*  < >  --  3.9 2.6 2.8 2.9 3.3  < > = values in this interval not displayed.  Liver Function Tests:  Recent Labs Lab 10/23/16 1315  10/24/16 0547 10/24/16 1555 10/25/16 0600 10/25/16 1644 10/26/16 0500 10/26/16 1623  AST 27  --  23  --  27  --  35  --   ALT 7*  --  6*  --  9*  --  6*  --   ALKPHOS 88  --  80  --  75  --  74  --   BILITOT 21.6*  --  19.5*  --  19.6*  --  20.0*  --   PROT 8.3*  --  7.2  --  7.3  --  7.6  --   ALBUMIN 3.0*  < > 2.5* 2.8* 2.6*  2.6* 2.7* 2.6*  2.8* 2.7*  < > = values in this interval not displayed. No results for input(s): LIPASE, AMYLASE in the last 168 hours.  Recent Labs Lab 10/25/16 1120  AMMONIA 36*    CBC:  Recent Labs Lab 10/23/16 0430 10/24/16 0547 10/25/16 0600 10/26/16 0500 10/27/16 0630  WBC 9.5 11.2* 11.4* 17.2* 13.3*  HGB 12.0* 11.5* 10.6* 10.4* 10.2*  HCT 33.0* 31.7* 29.6* 30.2* 29.4*  MCV 77.1* 79.1 80.7 82.7 83.5  PLT 124* 142* 142* 131* 132*    Cardiac Enzymes: No results for input(s): CKTOTAL, CKMB, CKMBINDEX, TROPONINI in  the last 168 hours.  BNP: BNP (last 3 results)  Recent Labs  05/16/16 0420 06/21/16 1218 06-19-2016 0957  BNP 2,453.6* 2,504.3* 3,542.3*    ProBNP (last 3 results) No results for input(s): PROBNP in the last 8760 hours.    Other results:  Imaging: No results found.   Medications:     Scheduled Medications: . amiodarone  400 mg Oral BID  .  ceFAZolin (ANCEF) IV  2 g Intravenous Q12H  . insulin aspart  0-5 Units Subcutaneous QHS  . insulin aspart  0-9 Units Subcutaneous TID WC  . midodrine  10 mg Oral TID WC  . senna-docusate  1 tablet Oral Daily  . sodium chloride flush  10-40 mL Intracatheter Q12H    Infusions: . sodium chloride Stopped (10/23/16 0730)  . DOBUTamine 10 mcg/kg/min (10/26/16 1846)  . norepinephrine (LEVOPHED) Adult infusion 16 mcg/min (10/26/16 2320)  . dialysis replacement fluid (prismasate) 700 mL/hr at 10/27/16 0123  . dialysis replacement fluid (prismasate) 700 mL/hr at 10/27/16 0123  . dialysate (PRISMASATE) 1,500 mL/hr at 10/27/16 0651    PRN Medications: acetaminophen, bisacodyl, heparin, heparin, magnesium hydroxide, ondansetron (ZOFRAN) IV, sodium chloride, sodium chloride, sodium chloride flush, traMADol   Assessment/Plan/Discussion    1. 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  NICM. ECHO this admission EF 10% with severe RV dilation and dysfunction.  Weight down with CVVHD. Still anuric.  Co-ox 50%, somewhat improved.  - He remains on norepinephrine at 16 mcg and dobutamine 10 mcg.  Continue midodrine 10 mg three times a day.  We have been unable  to wean norepinephrine any further with soft BP.  - CVP >20.  Not sure we can get much better with severe RV dysfunction.  Running gently negative with CVVH.  - Not candidate for VAD or transplant so have not pursued ECMO (Would need heart/kidney. Had ongoing tobacco use on admit and last cocaine use about 3 months ago. Blood type B+). - Main  issue now is whether or not kidneys will recover and how long we should continue CVVHD. With inotrope dependence and severe HF, not candidate for iHD.  He continues to deteriorate with multi-system organ failure. He is terminally ill. Remains anuric with low co-ox despite dobutamine 10 and norepi 16. We have run out of options.  He does not seem to have good insight into the current situation.  Would probably continue through weekend, will then need discussion regarding scaling back level of care.  2. Acute on CKD III:  Anuric. Now on CVVH (started 11/17).  3. PAF/atrial tach:  Had transient CHB, now looks like atrial tachycardia with 2:1 block.  4. H/O LV thrombus 5. H/o polysubstance abuse (ETOH, cocaine, tobacco use) 6. Hyperkalemia: Resolved CVVH.  9. MSSA sepsis: Now on Ancef.  10. Syncope: VT [employer].  Device interrogation showed multiple episodes of VT. Loading amio per EP. K repleted. No further VT over the last few days. EP following 11. Melena: small amount reported. Hgb stable 10.2.   12. Hyperbilirubinemia: Rifampin and Amio potential causative agents. Rifampin stopped.   Length of Stay: 17  Critical Care Time devoted to patient care services described in this note is 35 Minutes.  Fransico Meadow 7:27 AM  10/27/2016

## 2016-10-27 NOTE — Progress Notes (Signed)
Subjective: Interval History: has complaints . May want to go home Tues , knows he may die.  Objective: Vital signs in last 24 hours: Temp:  [96.7 F (35.9 C)-98.9 F (37.2 C)] 98.9 F (37.2 C) (12/02 0404) Pulse Rate:  [71-94] 93 (12/02 0637) Resp:  [17-27] 20 (12/01 1700) BP: (73-100)/(51-73) 94/68 (12/02 0600) SpO2:  [92 %-100 %] 96 % (12/02 0637) Weight:  [69.9 kg (154 lb 1.6 oz)] 69.9 kg (154 lb 1.6 oz) (12/02 4128) Weight change: 0.001 kg (0 oz)  Intake/Output from previous day: 12/01 0701 - 12/02 0700 In: 1665.9 [P.O.:810; I.V.:655.9; IV Piggyback:200] Out: 2487  Intake/Output this shift: No intake/output data recorded.  General appearance: alert, cooperative and no distress Chest wall: R PC Cardio: S1, S2 normal, systolic murmur: holosystolic 2/6, blowing at apex and LV lift diffuse GI: pos bs, liver down 10 cm Extremitie2+edema 2+  Resp rales in bases Lab Results:  Recent Labs  10/26/16 0500 10/27/16 0630  WBC 17.2* 13.3*  HGB 10.4* 10.2*  HCT 30.2* 29.4*  PLT 131* 132*   BMET:  Recent Labs  10/26/16 1623 10/27/16 0630  NA 132* 133*  K 4.7 4.4  CL 98* 99*  CO2 24 25  GLUCOSE 94 99  BUN 8 8  CREATININE 1.31* 1.39*  CALCIUM 8.8* 8.6*   No results for input(s): PTH in the last 72 hours. Iron Studies: No results for input(s): IRON, TIBC, TRANSFERRIN, FERRITIN in the last 72 hours.  Studies/Results: No results found.  I have reviewed the patient's current medications.  Assessment/Plan: 1 AKI cardiorenal.  Still on NE, DB, vol xs slow removal and see if helps cardiac function.   2 Severe CM 3 Anemia stable 4 ^ LFTs. Congestion P slow UF, DB, slow NE wean    LOS: 17 days   Virgilene Stryker,Chrles L 10/27/2016,7:53 AM

## 2016-10-28 DIAGNOSIS — R55 Syncope and collapse: Secondary | ICD-10-CM

## 2016-10-28 LAB — COOXEMETRY PANEL
Carboxyhemoglobin: 1.7 % — ABNORMAL HIGH (ref 0.5–1.5)
Carboxyhemoglobin: 2.1 % — ABNORMAL HIGH (ref 0.5–1.5)
METHEMOGLOBIN: 1.2 % (ref 0.0–1.5)
Methemoglobin: 1 % (ref 0.0–1.5)
O2 SAT: 28.3 %
O2 Saturation: 28.4 %
TOTAL HEMOGLOBIN: 10.5 g/dL — AB (ref 12.0–16.0)
Total hemoglobin: 9.9 g/dL — ABNORMAL LOW (ref 12.0–16.0)

## 2016-10-28 LAB — RENAL FUNCTION PANEL
Albumin: 2.4 g/dL — ABNORMAL LOW (ref 3.5–5.0)
Anion gap: 11 (ref 5–15)
BUN: 7 mg/dL (ref 6–20)
CHLORIDE: 95 mmol/L — AB (ref 101–111)
CO2: 23 mmol/L (ref 22–32)
CREATININE: 1.44 mg/dL — AB (ref 0.61–1.24)
Calcium: 8.3 mg/dL — ABNORMAL LOW (ref 8.9–10.3)
GFR calc Af Amer: >60 mL/min (ref >60)
GFR, EST NON AFRICAN AMERICAN: 55 mL/min — AB (ref >60)
GLUCOSE: 181 mg/dL — AB (ref 65–99)
POTASSIUM: 4.3 mmol/L (ref 3.5–5.1)
Phosphorus: 2.3 mg/dL — ABNORMAL LOW (ref 2.5–4.6)
Sodium: 129 mmol/L — ABNORMAL LOW (ref 135–145)

## 2016-10-28 LAB — APTT: APTT: 44 s — AB (ref 24–36)

## 2016-10-28 LAB — CBC
HCT: 28.4 % — ABNORMAL LOW (ref 39.0–52.0)
Hemoglobin: 9.7 g/dL — ABNORMAL LOW (ref 13.0–17.0)
MCH: 29.3 pg (ref 26.0–34.0)
MCHC: 34.2 g/dL (ref 30.0–36.0)
MCV: 85.8 fL (ref 78.0–100.0)
PLATELETS: 127 10*3/uL — AB (ref 150–400)
RBC: 3.31 MIL/uL — AB (ref 4.22–5.81)
RDW: 24.9 % — ABNORMAL HIGH (ref 11.5–15.5)
WBC: 11.3 10*3/uL — AB (ref 4.0–10.5)

## 2016-10-28 LAB — HEPATIC FUNCTION PANEL
ALK PHOS: 68 U/L (ref 38–126)
ALT: 7 U/L — AB (ref 17–63)
AST: 39 U/L (ref 15–41)
Albumin: 2.3 g/dL — ABNORMAL LOW (ref 3.5–5.0)
BILIRUBIN TOTAL: 16.9 mg/dL — AB (ref 0.3–1.2)
Total Protein: 7 g/dL (ref 6.5–8.1)

## 2016-10-28 LAB — PROTIME-INR
INR: 2.39
PROTHROMBIN TIME: 26.5 s — AB (ref 11.4–15.2)

## 2016-10-28 LAB — MAGNESIUM: MAGNESIUM: 2.4 mg/dL (ref 1.7–2.4)

## 2016-10-28 NOTE — Progress Notes (Signed)
Subjective: Interval History: eating no C/O.  Objective: Vital signs in last 24 hours: Temp:  [97.2 F (36.2 C)-97.6 F (36.4 C)] 97.2 F (36.2 C) (12/03 0400) Pulse Rate:  [89-94] 91 (12/03 0630) Resp:  [14-29] 23 (12/03 0630) BP: (68-90)/(51-76) 77/65 (12/03 0630) SpO2:  [94 %-100 %] 97 % (12/03 0630) Weight:  [70.9 kg (156 lb 4.9 oz)] 70.9 kg (156 lb 4.9 oz) (12/03 0400) Weight change: 1 kg (2 lb 3.3 oz)  Intake/Output from previous day: 12/02 0701 - 12/03 0700 In: 2236 [P.O.:1390; I.V.:646; IV Piggyback:200] Out: 1520  Intake/Output this shift: No intake/output data recorded.  General appearance: alert, cooperative and no distress Resp: diminished breath sounds bilaterally and rales bibasilar Chest wall: R PC Cardio: S1, S2 normal, systolic murmur: holosystolic 2/6, blowing at apex and diffuse LV lift GI: at apex Extremities: edema 2+  Lab Results:  Recent Labs  10/27/16 0630 10/28/16 0445  WBC 13.3* 11.3*  HGB 10.2* 9.7*  HCT 29.4* 28.4*  PLT 132* 127*   BMET:  Recent Labs  10/27/16 1600 10/28/16 0445  NA 130* 129*  K 4.2 4.3  CL 96* 95*  CO2 25 23  GLUCOSE 129* 181*  BUN 9 7  CREATININE 1.48* 1.44*  CALCIUM 8.3* 8.3*   No results for input(s): PTH in the last 72 hours. Iron Studies: No results for input(s): IRON, TIBC, TRANSFERRIN, FERRITIN in the last 72 hours.  Studies/Results: No results found.  I have reviewed the patient's current medications.  Assessment/Plan: 1 AKI ischemic ATN acid/base/K ok.  Slowly lowering vol  bp steady in 70s 2 Anemia slightly worse 3 CM no better  Will need Palliative 4 ^ LFTs congestive hepatopathy P CRRT, neg 25, counsel, NE,DB    LOS: 18 days   Dolorez Jeffrey,Kazimir L 10/28/2016,7:36 AM

## 2016-10-28 NOTE — Progress Notes (Signed)
Patient ID: Parker Johnson, male   DOB: 1966-09-24, 50 y.o.   MRN: [id number]    Advanced Heart Failure Rounding Note   Subjective:    Admitted with VT/syncope and profound cardiogenic shock. PICC placed with initial CO-OX 26%.   Underwent IABP and Trialysis cath placement on 11/17  On 11/19 developed sepsis. Bcx + for MSSA. Now on Ancef. F/u cultures negative.  IABP pulled 11/24. Swan out 11/25  Now on dobutamine 10 and norepi 16 mcg. He is on midodrine 10 mg tid.  SBP 70s-90s, have not been able to wean norepinephrine any further. He is very short of breath and fatigued.  Co-ox only 28% this morning.  CVVH continues, not making urine.   Remains in atrial tach 2:1 block.     INR 2.3. Heparin off.   F/u bcx are negative.   Objective:   Weight Range:  Vital Signs:   Temp:  [97.2 F (36.2 C)-97.6 F (36.4 C)] 97.2 F (36.2 C) (12/03 0400) Pulse Rate:  [89-94] 89 (12/03 0800) Resp:  [14-29] 16 (12/03 0800) BP: (68-90)/(51-76) 87/64 (12/03 0800) SpO2:  [94 %-100 %] 95 % (12/03 0800) Weight:  [156 lb 4.9 oz (70.9 kg)] 156 lb 4.9 oz (70.9 kg) (12/03 0400) Last BM Date: 10/27/16  Weight change: Filed Weights   10/26/16 0600 10/27/16 0637 10/28/16 0400  Weight: 154 lb 1.6 oz (69.9 kg) 154 lb 1.6 oz (69.9 kg) 156 lb 4.9 oz (70.9 kg)    Intake/Output:   Intake/Output Summary (Last 24 hours) at 10/28/16 0816 Last data filed at 10/28/16 0700  Gross per 24 hour  Intake           1969.5 ml  Output             1520 ml  Net            449.5 ml     Physical Exam: CVP > 20  General:  In the chair. NAD HEENT: Normal except sclera icterus Neck: supple. JVP remains elevated to ear. RIJ cordis . Carotids 2+ bilat; no bruits. No thyromegaly or nodule noted.  Cor: PMI nondisplaced. RRR. No murmurs or rubs. +S3 Lungs:clear    Abdomen: soft, NT, ND, no HSM. No bruits or masses. +BS  Extremities: no cyanosis, clubbing, rash  RUE PICC. No edema. Ted hose present. R femoral  trialysis cath.  Neuro: awake. Moves all 4 extremities w/o difficulty.   Telemetry: Reviewed, atrial tach with 2:1 block  Labs: Basic Metabolic Panel:  Recent Labs Lab 10/23/16 0430  10/24/16 0547  10/25/16 1644 10/26/16 0500 10/27/16 0630 10/27/16 1600 10/28/16 0445  NA 131*  < >  --   < > 132* 131* 133* 130* 129*  K 4.2  < >  --   < > 4.5 4.7 4.4 4.2 4.3  CL 97*  < >  --   < > 97* 97* 99* 96* 95*  CO2 25  < >  --   < > 25 24 25 25 23   GLUCOSE 121*  < >  --   < > 121* 102* 99 129* 181*  BUN 12  < >  --   < > 8 8 8 9 7   CREATININE 1.47*  < >  --   < > 1.35* 1.34* 1.39* 1.48* 1.44*  CALCIUM 8.7*  < >  --   < > 8.6* 8.7* 8.6* 8.3* 8.3*  MG 2.4  --  2.5*  --   --  2.5*  2.6*  --  2.4  PHOS 2.4*  < >  --   < > 2.8 2.9 2.5 2.2* 2.3*  < > = values in this interval not displayed.  Liver Function Tests:  Recent Labs Lab 10/24/16 0547  10/25/16 0600 10/25/16 1644 10/26/16 0500 10/27/16 0630 10/27/16 1600 10/28/16 0445  AST 23  --  27  --  35 39  --  39  ALT 6*  --  9*  --  6* 8*  --  7*  ALKPHOS 80  --  75  --  74 74  --  68  BILITOT 19.5*  --  19.6*  --  20.0* 18.3*  --  16.9*  PROT 7.2  --  7.3  --  7.6 7.5  --  7.0  ALBUMIN 2.5*  < > 2.6*  2.6* 2.7* 2.6*  2.8* 2.5*  2.5* 2.6* 2.3*  2.4*  < > = values in this interval not displayed. No results for input(s): LIPASE, AMYLASE in the last 168 hours.  Recent Labs Lab 10/25/16 1120  AMMONIA 36*    CBC:  Recent Labs Lab 10/24/16 0547 10/25/16 0600 10/26/16 0500 10/27/16 0630 10/28/16 0445  WBC 11.2* 11.4* 17.2* 13.3* 11.3*  HGB 11.5* 10.6* 10.4* 10.2* 9.7*  HCT 31.7* 29.6* 30.2* 29.4* 28.4*  MCV 79.1 80.7 82.7 83.5 85.8  PLT 142* 142* 131* 132* 127*    Cardiac Enzymes: No results for input(s): CKTOTAL, CKMB, CKMBINDEX, TROPONINI in the last 168 hours.  BNP: BNP (last 3 results)  Recent Labs  05/16/16 0420 06/21/16 1218 11-01-16 0957  BNP 2,453.6* 2,504.3* 3,542.3*    ProBNP (last 3  results) No results for input(s): PROBNP in the last 8760 hours.    Other results:  Imaging: No results found.   Medications:     Scheduled Medications: . amiodarone  400 mg Oral BID  .  ceFAZolin (ANCEF) IV  2 g Intravenous Q12H  . midodrine  10 mg Oral TID WC  . senna-docusate  1 tablet Oral Daily  . sodium chloride flush  10-40 mL Intracatheter Q12H    Infusions: . sodium chloride Stopped (10/23/16 0730)  . DOBUTamine 10 mcg/kg/min (10/27/16 2000)  . norepinephrine (LEVOPHED) Adult infusion 16 mcg/min (10/27/16 2000)  . dialysis replacement fluid (prismasate) 700 mL/hr at 10/28/16 0055  . dialysis replacement fluid (prismasate) 700 mL/hr at 10/28/16 0055  . dialysate (PRISMASATE) 1,500 mL/hr at 10/28/16 0055    PRN Medications: acetaminophen, bisacodyl, heparin, heparin, magnesium hydroxide, ondansetron (ZOFRAN) IV, sodium chloride, sodium chloride flush, traMADol   Assessment/Plan/Discussion    1. 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  NICM. ECHO this admission EF 10% with severe RV dilation and dysfunction.  Weight down with CVVHD. Still anuric.  He is doing markedly poorly, co-ox 28% and very symptomatic.   - He remains on norepinephrine at 16 mcg and dobutamine 10 mcg.  Continue midodrine 10 mg three times a day.  We have been unable to wean norepinephrine any further with soft BP.  Would avoid escalation at this point as we really are not going to be able to turn thinks around long-term.  - CVP >20.  Not sure we can get much better with severe RV dysfunction.  Running gently negative with CVVH.  - Not candidate for VAD or transplant so have not pursued ECMO (Would need heart/kidney. Had ongoing tobacco use on admit and last cocaine use about 3 months ago. Blood type B+). - He  continues to deteriorate with multi-system organ failure. He is terminally ill. Remains anuric with low co-ox despite dobutamine 10 and  norepi 16.  Co-ox now very low, bilirubin up to 16.  We have run out of options.  I talked to him today about our lack of options.  I also want palliative care to come by to talk with him today.  No escalation, would be very reasonable to proceed to comfort measures. 2. Acute on CKD III:  Anuric. Now on CVVH (started 11/17).  3. PAF/atrial tach:  Had transient CHB, now looks like atrial tachycardia with 2:1 block.  4. H/O LV thrombus 5. H/o polysubstance abuse (ETOH, cocaine, tobacco use) 6. Hyperkalemia: Resolved CVVH.  9. MSSA sepsis: Now on Ancef.  10. Syncope: VT [employer].  Device interrogation showed multiple episodes of VT. Loading amio per EP. K repleted. No further VT over the last few days.  11. Melena: small amount reported. Hgb stable.   12. Hyperbilirubinemia: Rifampin and Amio potential causative agents. Rifampin stopped. Bilirubin very high today, RV failure likely big contributor.   See above, he has end stage CHF.  I am going to involve palliative care.  He still does not have great insight into the situation but understands we have nothing else effective to offer him.   Length of Stay: 18  Doc Mandala,MD 8:16 AM  10/28/2016

## 2016-10-28 NOTE — Consult Note (Signed)
Consultation Note Date: 10/28/2016   Patient Name: Parker Johnson  DOB: 1966/10/07  MRN: [id number]  Age / Sex: 50 y.o., male  PCP: Rinaldo Cloud, MD Referring Physician: Duke Salvia, MD  Reason for Consultation: Establishing goals of care  Life limiting illness: ens stage CHF, now with multi-system organ failure.   HPI/Patient Profile: 50 y.o. 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 Patient underwent intra aortic balloon pump, unfortunately, hospital course also complicated by sepsis. Bcx + for MSSA. Now on Ancef. ID following. Patient has remained on CRRT, dobutamine and nor epinephrine. He remains fatigued and with lack of urine output and lack of meaningful recovery thus far. Hence, a palliative consult has been obtained.     Clinical Assessment and Goals of Care: .Patient is resting in chair. He is awake and alert. I introduced myself and palliative care as follows: Palliative medicine is specialized medical care for people living with serious illness. It focuses on providing relief from the symptoms and stress of a serious illness. The goal is to improve quality of life for both the patient and the family.  Goals of care discussions undertaken. Patient's wife is currently not at the bedside. Patient states that he does not feel that sick. He denies any acute complaints. Discussed about the patient's multiple serious illnesses as noted above. The patient states he does not know what to do. Gentle discussions about how hospice can help undertaken. Patient states he feels overwhelmed.  NEXT OF KIN  wife Amillion Helderman [phone] Has a 59 year old son with [facility], also has a 43 year old daughter from a previous relationship.   SUMMARY OF RECOMMENDATIONS   Limited  code Continue current treatment   call placed and discussed with wife to arrange family meeting to further discuss goals of care: family meeting arranged for 10-29-16 at 0930. Further recommendations will follow outcome of those discussions.  Continue gentle conversations with patient regarding the serious nature of his illness.  Agree that patient has extremely guarded prognosis.   Code Status/Advance Care Planning:  Limited code    Symptom Management:   Continue current treatment.   Palliative Prophylaxis:   Bowel Regimen Psycho-social/Spiritual:   Desire for further Chaplaincy support:yes  Additional Recommendations: Education on Hospice  Prognosis:   Unable to determine  Discharge Planning: To Be Determined      Primary Diagnoses: Present on Admission: . Ventricular fibrillation (HCC)   I have reviewed the medical record, interviewed the patient and family, and examined the patient. The following aspects are pertinent.  Past Medical History:  Diagnosis Date  . AICD (automatic cardioverter/defibrillator) present    [employer] 100  . Alcohol abuse    hx  . Cardiomyopathy    secondary  . CHF (congestive heart failure) (HCC)   . Chronic anticoagulation    Coumadin for hx LV thrombus and PAF  . Gout   .  HTN (hypertension)    uncontrolled  . Tobacco abuse   . Ventricular tachycardia [facility])    Social History   Social History  . Marital status: Married    Spouse name: N/A  . Number of children: N/A  . Years of education: N/A   Occupational History  . Disabled    Social History Main Topics  . Smoking status: Current Some Day Smoker    Packs/day: 0.50    Years: 30.00    Types: Cigarettes  . Smokeless tobacco: Never Used     Comment: I already have information on quitting  . Alcohol use 0.6 oz/week    1 Cans of beer per week     Comment: None in 3 months  . Drug use: No     Comment: history of cocaine use that is not very recent  (1-2 yrs)  . Sexual activity: Yes    Birth control/ protection: None   Other Topics Concern  . None   Social History Narrative   Disabled. Did not finish the 12th grade but is interested in pursuing a GED.    Family History  Problem Relation Age of Onset  . Cardiomyopathy Father     on a defib  . Diabetes Father    Scheduled Meds: . amiodarone  400 mg Oral BID  .  ceFAZolin (ANCEF) IV  2 g Intravenous Q12H  . midodrine  10 mg Oral TID WC  . senna-docusate  1 tablet Oral Daily  . sodium chloride flush  10-40 mL Intracatheter Q12H   Continuous Infusions: . sodium chloride Stopped (10/23/16 0730)  . DOBUTamine 10 mcg/kg/min (10/27/16 2000)  . norepinephrine (LEVOPHED) Adult infusion 16 mcg/min (10/28/16 1138)  . dialysis replacement fluid (prismasate) 700 mL/hr at 10/28/16 0905  . dialysis replacement fluid (prismasate) 700 mL/hr at 10/28/16 0055  . dialysate (PRISMASATE) 1,500 mL/hr at 10/28/16 1213   PRN Meds:.acetaminophen, bisacodyl, heparin, heparin, magnesium hydroxide, ondansetron (ZOFRAN) IV, sodium chloride, sodium chloride flush, traMADol Medications Prior to Admission:  Prior to Admission medications   Medication Sig Start Date End Date Taking? Authorizing Provider  allopurinol (ZYLOPRIM) 100 MG tablet Take 100 mg by mouth daily.   Yes Historical Provider, MD  digoxin (LANOXIN) 0.25 MG tablet Take 0.25 mg by mouth daily.    Yes Historical Provider, MD  potassium chloride SA (K-DUR,KLOR-CON) 20 MEQ tablet Take 2 tablets (40 mEq total) by mouth daily. 05/18/16  Yes Rinaldo Cloud, MD  spironolactone (ALDACTONE) 25 MG tablet Take 1 tablet (25 mg total) by mouth daily. 05/18/16  Yes Rinaldo Cloud, MD  torsemide (DEMADEX) 100 MG tablet Take 100 mg by mouth 2 (two) times daily.   Yes Historical Provider, MD  warfarin (COUMADIN) 7.5 MG tablet Take 1 tablet (7.5 mg total) by mouth daily at 6 PM. Patient taking differently: Take 3.75-7.5 mg by mouth daily at 6 PM. Patient takes  7.5 mg everyday except on Sunday patient takes 3.75 mg 05/18/16  Yes Rinaldo Cloud, MD  amiodarone (PACERONE) 200 MG tablet Take 1 tablet (200 mg total) by mouth daily. Patient not taking: Reported on 10/08/2016 02/28/16   Duke Salvia, MD  nicotine (NICODERM CQ - DOSED IN MG/24 HOURS) 14 mg/24hr patch Place 1 patch (14 mg total) onto the skin daily. Patient not taking: Reported on 10/23/2016 01/01/16   Rinaldo Cloud, MD   No Known Allergies Review of Systems Denies pain  Physical Exam  NAD Clear lungs S1 S2 Abdomen soft No edema Awake alert  Vital Signs: BP (!) 83/61   Pulse 93   Temp (!) 96.4 F (35.8 C) (Axillary)   Resp 17   Ht 5\' 7"  (1.702 m)   Wt 70.9 kg (156 lb 4.9 oz)   SpO2 99%   BMI 24.48 kg/m  Pain Assessment: No/denies pain   Pain Score: 1    SpO2: SpO2: 99 % O2 Device:SpO2: 99 % O2 Flow Rate: .O2 Flow Rate (L/min): 2 L/min  IO: Intake/output summary:  Intake/Output Summary (Last 24 hours) at 10/28/16 1249 Last data filed at 10/28/16 1200  Gross per 24 hour  Intake             1936 ml  Output             1499 ml  Net              437 ml    LBM: Last BM Date: 10/28/16 Baseline Weight: Weight: 81.6 kg (180 lb) Most recent weight: Weight: 70.9 kg (156 lb 4.9 oz)     Palliative Assessment/Data:   Flowsheet Rows   Flowsheet Row Most Recent Value  Intake Tab  Referral Department  Cardiology  Unit at Time of Referral  Cardiac/Telemetry Unit  Palliative Care Primary Diagnosis  Cardiac  Palliative Care Type  New Palliative care  Reason for referral  Clarify Goals of Care, Counsel Regarding Hospice  Date first seen by Palliative Care  10/28/16  Clinical Assessment  Palliative Performance Scale Score  30%  Pain Max last 24 hours  4  Pain Min Last 24 hours  3  Dyspnea Max Last 24 Hours  4  Dyspnea Min Last 24 hours  3  Nausea Max Last 24 Hours  4  Nausea Min Last 24 Hours  3  Psychosocial & Spiritual Assessment  Palliative Care Outcomes   Patient/Family meeting held?  Yes  Who was at the meeting?  patient  Palliative Care Outcomes  Clarified goals of care      Time In:  12  Time Out:  1300 Time Total:  60  Greater than 50%  of this time was spent counseling and coordinating care related to the above assessment and plan.  Signed by: Rosalin HawkingZeba Allycia Pitz, MD  [phone]  Please contact Palliative Medicine Team phone at [phone] for questions and concerns.  For individual provider: See Loretha StaplerAmion

## 2016-10-29 DIAGNOSIS — Z7189 Other specified counseling: Secondary | ICD-10-CM

## 2016-10-29 DIAGNOSIS — Z515 Encounter for palliative care: Secondary | ICD-10-CM

## 2016-10-29 LAB — HEPATIC FUNCTION PANEL
ALT: 6 U/L — ABNORMAL LOW (ref 17–63)
AST: 46 U/L — ABNORMAL HIGH (ref 15–41)
Albumin: 2.4 g/dL — ABNORMAL LOW (ref 3.5–5.0)
Alkaline Phosphatase: 83 U/L (ref 38–126)
BILIRUBIN INDIRECT: 5.4 mg/dL — AB (ref 0.3–0.9)
Bilirubin, Direct: 9.2 mg/dL — ABNORMAL HIGH (ref 0.1–0.5)
TOTAL PROTEIN: 6.7 g/dL (ref 6.5–8.1)
Total Bilirubin: 14.6 mg/dL — ABNORMAL HIGH (ref 0.3–1.2)

## 2016-10-29 LAB — BASIC METABOLIC PANEL
ANION GAP: 8 (ref 5–15)
BUN: 7 mg/dL (ref 6–20)
CALCIUM: 7.8 mg/dL — AB (ref 8.9–10.3)
CO2: 24 mmol/L (ref 22–32)
Chloride: 93 mmol/L — ABNORMAL LOW (ref 101–111)
Creatinine, Ser: 1.31 mg/dL — ABNORMAL HIGH (ref 0.61–1.24)
GFR calc Af Amer: >60 mL/min (ref >60)
Glucose, Bld: 222 mg/dL — ABNORMAL HIGH (ref 65–99)
POTASSIUM: 4.5 mmol/L (ref 3.5–5.1)
SODIUM: 125 mmol/L — AB (ref 135–145)

## 2016-10-29 LAB — CBC
HCT: 26.7 % — ABNORMAL LOW (ref 39.0–52.0)
Hemoglobin: 9.1 g/dL — ABNORMAL LOW (ref 13.0–17.0)
MCH: 29.5 pg (ref 26.0–34.0)
MCHC: 34.1 g/dL (ref 30.0–36.0)
MCV: 86.7 fL (ref 78.0–100.0)
PLATELETS: 111 10*3/uL — AB (ref 150–400)
RBC: 3.08 MIL/uL — ABNORMAL LOW (ref 4.22–5.81)
RDW: 26.8 % — AB (ref 11.5–15.5)
WBC: 9.7 10*3/uL (ref 4.0–10.5)

## 2016-10-29 LAB — MAGNESIUM: MAGNESIUM: 2.5 mg/dL — AB (ref 1.7–2.4)

## 2016-10-29 LAB — COOXEMETRY PANEL
CARBOXYHEMOGLOBIN: 1.7 % — AB (ref 0.5–1.5)
METHEMOGLOBIN: 1 % (ref 0.0–1.5)
O2 SAT: 37.5 %
TOTAL HEMOGLOBIN: 8.6 g/dL — AB (ref 12.0–16.0)

## 2016-10-29 LAB — RENAL FUNCTION PANEL
ALBUMIN: 2.5 g/dL — AB (ref 3.5–5.0)
Anion gap: 9 (ref 5–15)
BUN: 6 mg/dL (ref 6–20)
CHLORIDE: 99 mmol/L — AB (ref 101–111)
CO2: 24 mmol/L (ref 22–32)
CREATININE: 1.41 mg/dL — AB (ref 0.61–1.24)
Calcium: 8.3 mg/dL — ABNORMAL LOW (ref 8.9–10.3)
GFR calc Af Amer: >60 mL/min (ref >60)
GFR, EST NON AFRICAN AMERICAN: 57 mL/min — AB (ref >60)
GLUCOSE: 106 mg/dL — AB (ref 65–99)
PHOSPHORUS: 2.5 mg/dL (ref 2.5–4.6)
Potassium: 4.7 mmol/L (ref 3.5–5.1)
Sodium: 132 mmol/L — ABNORMAL LOW (ref 135–145)

## 2016-10-29 LAB — PROTIME-INR
INR: 2.49
Prothrombin Time: 27.4 seconds — ABNORMAL HIGH (ref 11.4–15.2)

## 2016-10-29 LAB — PHOSPHORUS: PHOSPHORUS: 2.1 mg/dL — AB (ref 2.5–4.6)

## 2016-10-29 LAB — APTT: APTT: 44 s — AB (ref 24–36)

## 2016-10-29 MED ORDER — FENTANYL CITRATE (PF) 100 MCG/2ML IJ SOLN
12.5000 ug | INTRAMUSCULAR | Status: DC | PRN
  Administered 2016-10-30 (×2): 12.5 ug via INTRAVENOUS
  Filled 2016-10-29: qty 2

## 2016-10-29 NOTE — Progress Notes (Signed)
Patient ID: Parker Johnson, male   DOB: 1966-05-27, 50 y.o.   MRN: [id number]    Advanced Heart Failure Rounding Note   Subjective:    Admitted with VT/syncope and profound cardiogenic shock. PICC placed with initial CO-OX 26%.   Underwent IABP and Trialysis cath placement on 11/17  On 11/19 developed sepsis. Bcx + for MSSA. Now on Ancef. F/u cultures negative.  IABP pulled 11/24. Swan out 11/25  Remains on dobutamine 10 and norepi 16 mcg. He is on midodrine 10 mg tid. SBPs remain 70-90s  SBPs remain 70-90s. Unable to wean further. Remains very fatigued and severely dyspneic with any exertion.     Co-ox 37.5% this am.  Remains on CVVH. No urine output.   Remains in atrial tach 2:1 block.     INR 2.49. Heparin off.   F/u bcx are negative.   Objective:   Weight Range:  Vital Signs:   Temp:  [96.4 F (35.8 C)-97.8 F (36.6 C)] 97.3 F (36.3 C) (12/04 0400) Pulse Rate:  [86-101] 90 (12/04 0700) Resp:  [15-23] 18 (12/04 0700) BP: (71-92)/(52-72) 83/72 (12/04 0700) SpO2:  [95 %-100 %] 95 % (12/04 0700) Weight:  [161 lb 9.6 oz (73.3 kg)] 161 lb 9.6 oz (73.3 kg) (12/04 0700) Last BM Date: 10/28/16  Weight change: Filed Weights   10/27/16 0637 10/28/16 0400 10/29/16 0700  Weight: 154 lb 1.6 oz (69.9 kg) 156 lb 4.9 oz (70.9 kg) 161 lb 9.6 oz (73.3 kg)    Intake/Output:   Intake/Output Summary (Last 24 hours) at 10/29/16 0753 Last data filed at 10/29/16 0710  Gross per 24 hour  Intake             1908 ml  Output             1523 ml  Net              385 ml     Physical Exam: CVP > 20  General:  Lying in chair. Fatigued and chronically ill appearing.  HEENT: +Scleral Icterus. Otherwise normal. Neck: supple. JVP remains elevated > ear. RIJ cordis in place. Carotids 2+ bilat; no bruits. No thyromegaly or nodule noted.  Cor: PMI nondisplaced. RRR. No murmurs or rubs. +S3 Lungs: Diminished throughout.  Abdomen: soft, NT, ND, no HSM. No bruits or masses. +BS    Extremities: no cyanosis, clubbing, rash  RUE PICC. No peripheral edema. + Ted hose. R femoral trialysis cath.  Neuro: awake. Moves all 4 extremities w/o difficulty.   Telemetry: Reviewed, A-tach with 2:1 block.   Labs: Basic Metabolic Panel:  Recent Labs Lab 10/24/16 0547  10/26/16 0500 10/27/16 0630 10/27/16 1600 10/28/16 0445 10/29/16 0530  NA  --   < > 131* 133* 130* 129* 125*  K  --   < > 4.7 4.4 4.2 4.3 4.5  CL  --   < > 97* 99* 96* 95* 93*  CO2  --   < > 24 25 25 23 24   GLUCOSE  --   < > 102* 99 129* 181* 222*  BUN  --   < > 8 8 9 7 7   CREATININE  --   < > 1.34* 1.39* 1.48* 1.44* 1.31*  CALCIUM  --   < > 8.7* 8.6* 8.3* 8.3* 7.8*  MG 2.5*  --  2.5* 2.6*  --  2.4 2.5*  PHOS  --   < > 2.9 2.5 2.2* 2.3* 2.1*  < > = values in this interval  not displayed.  Liver Function Tests:  Recent Labs Lab 10/25/16 0600  10/26/16 0500 10/27/16 0630 10/27/16 1600 10/28/16 0445 10/29/16 0530  AST 27  --  35 39  --  39 46*  ALT 9*  --  6* 8*  --  7* 6*  ALKPHOS 75  --  74 74  --  68 83  BILITOT 19.6*  --  20.0* 18.3*  --  16.9* 14.6*  PROT 7.3  --  7.6 7.5  --  7.0 6.7  ALBUMIN 2.6*  2.6*  < > 2.6*  2.8* 2.5*  2.5* 2.6* 2.3*  2.4* 2.4*  < > = values in this interval not displayed. No results for input(s): LIPASE, AMYLASE in the last 168 hours.  Recent Labs Lab 10/25/16 1120  AMMONIA 36*    CBC:  Recent Labs Lab 10/25/16 0600 10/26/16 0500 10/27/16 0630 10/28/16 0445 10/29/16 0530  WBC 11.4* 17.2* 13.3* 11.3* 9.7  HGB 10.6* 10.4* 10.2* 9.7* 9.1*  HCT 29.6* 30.2* 29.4* 28.4* 26.7*  MCV 80.7 82.7 83.5 85.8 86.7  PLT 142* 131* 132* 127* 111*    Cardiac Enzymes: No results for input(s): CKTOTAL, CKMB, CKMBINDEX, TROPONINI in the last 168 hours.  BNP: BNP (last 3 results)  Recent Labs  05/16/16 0420 06/21/16 1218 09/26/2016 0957  BNP 2,453.6* 2,504.3* 3,542.3*    ProBNP (last 3 results) No results for input(s): PROBNP in the last 8760  hours.    Other results:  Imaging: No results found.   Medications:     Scheduled Medications: . amiodarone  400 mg Oral BID  .  ceFAZolin (ANCEF) IV  2 g Intravenous Q12H  . midodrine  10 mg Oral TID WC  . senna-docusate  1 tablet Oral Daily  . sodium chloride flush  10-40 mL Intracatheter Q12H    Infusions: . sodium chloride Stopped (10/23/16 0730)  . DOBUTamine 10 mcg/kg/min (10/28/16 1327)  . norepinephrine (LEVOPHED) Adult infusion 16 mcg/min (10/28/16 1138)  . dialysis replacement fluid (prismasate) 700 mL/hr at 10/29/16 0729  . dialysis replacement fluid (prismasate) 700 mL/hr at 10/29/16 0729  . dialysate (PRISMASATE) 1,500 mL/hr at 10/29/16 0505    PRN Medications: acetaminophen, bisacodyl, heparin, heparin, magnesium hydroxide, ondansetron (ZOFRAN) IV, sodium chloride, sodium chloride flush, traMADol   Assessment/Plan/Discussion    1. 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  NICM. ECHO this admission EF 10% with severe RV dilation and dysfunction.  Weight down with CVVHD. Still anuric.  He is doing markedly poorly, co-ox 28% and very symptomatic.  - He remains on norepinephrine at 16 mcg,dobutamine 10 mcg, and midodrine 10 mg TID.  - Avoid escalation with no good long term options.  - CVP >20.  Not sure we can get much better with severe RV dysfunction.  Running gently negative with CVVH.  - Not candidate for VAD or transplant so have not pursued ECMO (Would need heart/kidney. Had ongoing tobacco use on admit and last cocaine use about 3 months ago. Blood type B+). - He continues to deteriorate with multi-system organ failure. He is terminally ill. Remains anuric with low co-ox despite dobutamine 10 and norepi 16.  Co-ox persistently low despite multiple pressors.  Bilirubin 14.6 this am.  - We, unfortunately, have nothing left to offer him.  Palliative consulted and family meeting planned for this morning.  No  escalation. Look at comfort measures, likely soon.    2. Acute on CKD III:   - Anuric. Remains on CVVH. (  started 11/17).  3. PAF/atrial tach:  - Had transient CHB, now looks like atrial tachycardia with 2:1 block.  4. H/O LV thrombus 5. H/o polysubstance abuse (ETOH, cocaine, tobacco use) 6. Hyperkalemia: Resolved CVVH.  9. MSSA sepsis: Now on Ancef.  10. Syncope: VT [employer].  Device interrogation showed multiple episodes of VT. Loading amio per EP. K repleted. No further VT over the last few days.  11. Melena: - Small amount previously reported.  - Hgb stable.   12. Hyperbilirubinemia:  - Rifampin and Amio potential causative agents. Rifampin stopped. Bilirubin very high today, RV failure likely big contributor.   Palliative care meeting with family further this am.  Pt is very likely nearing End of life.   Length of Stay: [address] [place], [place] 7:53 AM  10/29/2016   Advanced Heart Failure Team Pager [phone] (M-F; 7a - 4p)  Please contact [facility] for night-coverage after hours (4p -7a ) and weekends on [email or web address]  Patient seen and examined with Otilio Saber, PA-C. We discussed all aspects of the encounter. I agree with the assessment and plan as stated above.   He continues to deteriorate. Co-ox low despite inotropes. Anuric. Very weak and dyspneic. Long discussions with patient, family and palliative care team. We have decided to switch to comfort care tomorrow at 4pm when family present. I suspect he will pass in the next few days and he is aware of this and relies on his faith to cope. Appreciate Palliative Care Team's involvement and time spent with him.  The patient is critically ill with multiple organ systems failure and requires high complexity decision making for assessment and support, frequent evaluation and titration of therapies, application of advanced monitoring technologies and extensive interpretation of multiple databases.   Critical  Care Time devoted to patient care services described in this note is 35 Minutes.  Bensimhon, Daniel,MD 9:32 PM

## 2016-10-29 NOTE — Progress Notes (Signed)
Pt seen in conjunction with Palliative care with discussions as outlined above  Appreciate their help  BP (!) 85/63   Pulse 91   Temp 97.6 F (36.4 C)   Resp 20   Ht 5\' 7"  (1.702 m)   Wt 161 lb 9.6 oz (73.3 kg)   SpO2 96%   BMI 25.31 kg/m   Well developed and nourished in no acute distress HENT normal Neck supple with JVP-flat Clear Regular rate and rhythm, no murmurs or gallops Abd-soft with active BS No Clubbing cyanosis edema Skin-warm and dry A & Oriented  Grossly normal sensory and motor function  On CVVH   Time spent 45 min

## 2016-10-29 NOTE — Progress Notes (Signed)
Pt filter clotted, Blood returned. .  Filter changed.

## 2016-10-29 NOTE — Progress Notes (Signed)
Daily Progress Note   Patient Name: Parker Johnson       Date: 10/29/2016 DOB: 05-04-1966  Age: 50 y.o. MRN#: [id number] Attending Physician: Duke Salvia, MD Primary Care Physician: Rinaldo Cloud, MD Admit Date: 09/30/2016  Reason for Consultation/Follow-up: Establishing goals of care  Life limiting illness: end stage CHF, now with multi-system organ failure.   Subjective:  Patient appears much more weak and fatigued, he is resting in chair.  Denies any complaints, does not feel short of breath, does not complain of pain anywhere.  Wife and mother are at the bedside.  See family meeting note below:   Length of Stay: 19  Current Medications: Scheduled Meds:  . amiodarone  400 mg Oral BID  .  ceFAZolin (ANCEF) IV  2 g Intravenous Q12H  . midodrine  10 mg Oral TID WC  . senna-docusate  1 tablet Oral Daily  . sodium chloride flush  10-40 mL Intracatheter Q12H    Continuous Infusions: . sodium chloride Stopped (10/23/16 0730)  . DOBUTamine 10 mcg/kg/min (10/29/16 0800)  . norepinephrine (LEVOPHED) Adult infusion 16 mcg/min (10/29/16 0800)  . dialysis replacement fluid (prismasate) 700 mL/hr at 10/29/16 0729  . dialysis replacement fluid (prismasate) 700 mL/hr at 10/29/16 0729  . dialysate (PRISMASATE) 1,500 mL/hr at 10/29/16 0838    PRN Meds: acetaminophen, bisacodyl, heparin, heparin, magnesium hydroxide, ondansetron (ZOFRAN) IV, sodium chloride, sodium chloride flush, traMADol  Physical Exam         Fatigued, chronically ill appearing,  Having hiccups S1 S2 Shallow clear breathing anteriorly Abdomen soft No edema Awake alert, oriented.  Answers all questions appropriately. Vital Signs: BP (!) 85/69   Pulse 89   Temp 97.4 F (36.3 C) (Oral)   Resp 17   Ht 5\' 7"   (1.702 m)   Wt 73.3 kg (161 lb 9.6 oz)   SpO2 99%   BMI 25.31 kg/m  SpO2: SpO2: 99 % O2 Device: O2 Device: Nasal Cannula O2 Flow Rate: O2 Flow Rate (L/min): 2 L/min  Intake/output summary:  Intake/Output Summary (Last 24 hours) at 10/29/16 1028 Last data filed at 10/29/16 0914  Gross per 24 hour  Intake           2061.5 ml  Output             1435 ml  Net            626.5 ml   LBM: Last BM Date: 10/29/16 Baseline Weight: Weight: 81.6 kg (180 lb) Most recent weight: Weight: 73.3 kg (161 lb 9.6 oz)       Palliative Assessment/Data:    Flowsheet Rows   Flowsheet Row Most Recent Value  Intake Tab  Referral Department  Cardiology  Unit at Time of Referral  Cardiac/Telemetry Unit  Palliative Care Primary Diagnosis  Cardiac  Palliative Care Type  Return patient Palliative Care  Reason for referral  Clarify Goals of Care, End of Life Care Assistance, Counsel Regarding Hospice  Date first seen by Palliative Care  10/28/16  Clinical Assessment  Palliative Performance Scale Score  30%  Pain Max last 24 hours  5  Pain Min Last 24 hours  4  Dyspnea Max Last 24 Hours  4  Dyspnea Min Last 24 hours  3  Nausea Max Last 24 Hours  4  Nausea Min Last 24 Hours  3  Psychosocial & Spiritual Assessment  Palliative Care Outcomes  Patient/Family meeting held?  Yes  Who was at the meeting?  patient wife mother   Palliative Care Outcomes  Clarified goals of care, Changed to focus on comfort      Patient Active Problem List   Diagnosis Date Noted  . Hyperbilirubinemia   . Fever and chills   . Syncope and collapse   . Infected defibrillator (HCC)   . Acute renal failure (HCC)   . Central line infection   . Septic shock (HCC) 10/17/2016  . Staphylococcus aureus bacteremia 10/17/2016  . Atrial tachycardia (HCC) 10/17/2016  . Cardiogenic shock (HCC)   . AKI (acute kidney injury) (HCC)   . Ventricular fibrillation (HCC) 10/06/2016  . Hypokalemia 05/13/2016  . CHF (congestive heart  failure) (HCC) 05/13/2016  . Chest pain 05/13/2016  . Chronic anticoagulation   . AICD -DDD  [employer] MADIT-RIT 03/13/2011  . TOBACCO ABUSE 07/29/2009  . HYPERTENSION, UNCONTROLLED 07/29/2009  . CARDIOMYOPATHY, SECONDARY 07/29/2009  . VENTRICULAR TACHYCARDIA 07/29/2009  . PAROXYSMAL ATRIAL FIBRILLATION 07/29/2009  . CHF 07/29/2009  . 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 Appreciate Dr Odessa Fleming input. Reviewed the patient's multiple serious illnesses listed above, that inspite of optimum medical interventions such as CRRT, antibiotics, pressors, inotropics, place him at a high risk for ongoing decline and death in the near future. Goals and wishes discussed. Extensive discussions held.   It is most important to the patient and family to continue their faith and belief in God and that Mr Gorniak will be healed. They are accepting of his multiple serious illnesses and their eventual irreversible nature.   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 Patient is thankful to God for this life, he states that he was told 9 years ago that he only had 1-2 more years to live because of how weak his heart is. He has 2 daughters and one brother. His father passed away few years ago with hospice care.   Patient's mother Elease Hashimoto states that she is wary of hospice services and especially concerned about  using Morphine. Extensive discussions held about the hospice philosophy being all about comfort at end of life, about appropriate judicious use of opioids and benzodiazepines for comfort at end of life. All  questions answered to their satisfaction.   PLAN: 1. Patient and family would like to spend time with each other today, they would like for patient's minister, children and several other family and friends come by to see him today.  2. Patient's brother is out of town and will arrive back tomorrow afternoon. Patient wishes to see him tomorrow.  3. At around 1600 on 10-30-16, patient and family would like to proceed with full scope of comfort measures. As has been discussed and agreed upon, this entails discontinuation of CRRT, antibiotics, pressors, inotropics. In addition, medications such as fentanyl IV PRN and/or benzos will be used to ensure comfort care.  4. Prognosis after establishment of comfort measures could be hours to days, with hospital death anticipated. Patient and family are aware that he remains critically ill with multi system organ failure.  5. Spiritual care will be provided.  6. Patient's wife has asked me to fax a letter to the patient's probation officer, explaining briefly his illness and why he is in the hospital. This will be accomplished today.    Code Status:    Code Status Orders        Start     Ordered   10/14/16 1345  Limited resuscitation (code)  Continuous    Question Answer Comment  In the event of cardiac or respiratory ARREST: Initiate Code Blue, Call Rapid Response Yes   In the event of cardiac or respiratory ARREST: Perform CPR No   In the event of cardiac or respiratory ARREST: Perform Intubation/Mechanical Ventilation No   In the event of cardiac or respiratory ARREST: Use NIPPV/BiPAp only if indicated Yes   In the event of cardiac or respiratory ARREST: Administer ACLS medications if indicated Yes   In the event of cardiac or respiratory ARREST: Perform Defibrillation or Cardioversion if indicated Yes      10/14/16 1345    Code Status History    Date Active Date Inactive Code Status Order ID Comments User Context   10/25/2016  3:58 PM 10/14/2016   1:45 PM Full Code [id number]  Sherald Hess, NP Inpatient   05/13/2016 12:28 PM 05/18/2016  5:07 PM Full Code [id number]  Darrol Jump, PA-C ED   12/28/2015 11:28 PM 01/01/2016  5:30 PM Full Code [id number]  Rinaldo Cloud, MD Inpatient   10/01/2015 10:17 PM 10/05/2015 10:46 PM Full Code [id number]  Orpah Cobb, MD ED   05/19/2015  6:24 PM 05/23/2015  8:43 PM Full Code [id number]  Rinaldo Cloud, MD Inpatient   03/13/2015  3:36 PM 03/17/2015  6:44 PM Full Code [id number]  Rinaldo Cloud, MD ED   03/09/2014  7:18 PM 03/14/2014  4:26 PM Full Code [id number]  Rinaldo Cloud, MD Inpatient   09/12/2013 10:57 PM 09/17/2013  5:50 PM Full Code [id number]  Rinaldo Cloud, MD Inpatient       Prognosis:   Hours - Days  Discharge Planning:  Anticipated Hospital Death  Care plan was discussed with  Patient, wife, mother, Dr Graciela Husbands.   Thank you for allowing the Palliative Medicine Team to assist in the care of this patient.   Time In: 9.30 Time Out: 10.45 Total Time 75 min  Prolonged Time Billed  yes       Greater than 50%  of this time was spent counseling  and coordinating care related to the above assessment and plan.  Rosalin HawkingZeba Lynzee Lindquist, MD [phone]  Please contact Palliative Medicine Team phone at [phone] for questions and concerns.

## 2016-10-30 DIAGNOSIS — Z515 Encounter for palliative care: Secondary | ICD-10-CM

## 2016-10-30 LAB — HEPATIC FUNCTION PANEL
ALBUMIN: 2.4 g/dL — AB (ref 3.5–5.0)
ALT: 8 U/L — ABNORMAL LOW (ref 17–63)
AST: 43 U/L — ABNORMAL HIGH (ref 15–41)
Alkaline Phosphatase: 64 U/L (ref 38–126)
Bilirubin, Direct: 9.4 mg/dL — ABNORMAL HIGH (ref 0.1–0.5)
Indirect Bilirubin: 6.3 mg/dL — ABNORMAL HIGH (ref 0.3–0.9)
TOTAL PROTEIN: 7 g/dL (ref 6.5–8.1)
Total Bilirubin: 15.7 mg/dL — ABNORMAL HIGH (ref 0.3–1.2)

## 2016-10-30 LAB — RENAL FUNCTION PANEL
ANION GAP: 12 (ref 5–15)
Albumin: 2.4 g/dL — ABNORMAL LOW (ref 3.5–5.0)
BUN: 7 mg/dL (ref 6–20)
CALCIUM: 8.1 mg/dL — AB (ref 8.9–10.3)
CO2: 22 mmol/L (ref 22–32)
CREATININE: 1.49 mg/dL — AB (ref 0.61–1.24)
Chloride: 92 mmol/L — ABNORMAL LOW (ref 101–111)
GFR, EST NON AFRICAN AMERICAN: 53 mL/min — AB (ref >60)
Glucose, Bld: 213 mg/dL — ABNORMAL HIGH (ref 65–99)
Phosphorus: 2.5 mg/dL (ref 2.5–4.6)
Potassium: 4.3 mmol/L (ref 3.5–5.1)
SODIUM: 126 mmol/L — AB (ref 135–145)

## 2016-10-30 LAB — COOXEMETRY PANEL
Carboxyhemoglobin: 1.9 % — ABNORMAL HIGH (ref 0.5–1.5)
METHEMOGLOBIN: 1.2 % (ref 0.0–1.5)
O2 Saturation: 35.4 %
Total hemoglobin: 9.2 g/dL — ABNORMAL LOW (ref 12.0–16.0)

## 2016-10-30 LAB — PROTIME-INR
INR: 2.68
Prothrombin Time: 29.1 seconds — ABNORMAL HIGH (ref 11.4–15.2)

## 2016-10-30 LAB — APTT: APTT: 47 s — AB (ref 24–36)

## 2016-10-30 LAB — CBC
HCT: 26.4 % — ABNORMAL LOW (ref 39.0–52.0)
Hemoglobin: 8.9 g/dL — ABNORMAL LOW (ref 13.0–17.0)
MCH: 29.7 pg (ref 26.0–34.0)
MCHC: 33.7 g/dL (ref 30.0–36.0)
MCV: 88 fL (ref 78.0–100.0)
PLATELETS: 100 10*3/uL — AB (ref 150–400)
RBC: 3 MIL/uL — ABNORMAL LOW (ref 4.22–5.81)
RDW: 27.7 % — AB (ref 11.5–15.5)
WBC: 8.1 10*3/uL (ref 4.0–10.5)

## 2016-10-30 LAB — MAGNESIUM: MAGNESIUM: 2.3 mg/dL (ref 1.7–2.4)

## 2016-10-30 MED ORDER — FENTANYL CITRATE (PF) 100 MCG/2ML IJ SOLN
12.5000 ug | INTRAMUSCULAR | Status: DC | PRN
  Administered 2016-10-30 – 2016-10-31 (×6): 25 ug via INTRAVENOUS
  Administered 2016-11-01: 12.5 ug via INTRAVENOUS
  Filled 2016-10-30 (×7): qty 2

## 2016-10-30 MED ORDER — BIOTENE DRY MOUTH MT LIQD: 15.0000 mL | OROMUCOSAL | Status: DC | PRN

## 2016-10-30 MED ORDER — LORAZEPAM 2 MG/ML IJ SOLN
1.0000 mg | INTRAMUSCULAR | Status: DC | PRN
  Administered 2016-11-01: 1 mg via INTRAVENOUS
  Filled 2016-10-30: qty 1

## 2016-10-30 MED ORDER — ACETAMINOPHEN 650 MG RE SUPP: 650.0000 mg | Freq: Four times a day (QID) | RECTAL | Status: DC | PRN

## 2016-10-30 MED ORDER — ONDANSETRON 4 MG PO TBDP: 4.0000 mg | ORAL_TABLET | Freq: Four times a day (QID) | ORAL | Status: DC | PRN

## 2016-10-30 MED ORDER — ONDANSETRON HCL 4 MG/2ML IJ SOLN: 4.0000 mg | Freq: Four times a day (QID) | INTRAMUSCULAR | Status: DC | PRN

## 2016-10-30 MED ORDER — HALOPERIDOL LACTATE 5 MG/ML IJ SOLN: 0.5000 mg | INTRAMUSCULAR | Status: DC | PRN

## 2016-10-30 MED ORDER — ACETAMINOPHEN 325 MG PO TABS: 650.0000 mg | ORAL_TABLET | Freq: Four times a day (QID) | ORAL | Status: DC | PRN

## 2016-10-30 MED ORDER — LORAZEPAM 2 MG/ML PO CONC
1.0000 mg | ORAL | Status: DC | PRN
  Administered 2016-11-02: 1 mg via SUBLINGUAL
  Filled 2016-10-30: qty 1

## 2016-10-30 MED ORDER — GLYCOPYRROLATE 1 MG PO TABS
1.0000 mg | ORAL_TABLET | ORAL | Status: DC | PRN
Start: 2016-10-30 — End: 2016-11-02
  Filled 2016-10-30: qty 1

## 2016-10-30 MED ORDER — POLYVINYL ALCOHOL 1.4 % OP SOLN
1.0000 [drp] | Freq: Four times a day (QID) | OPHTHALMIC | Status: DC | PRN
  Filled 2016-10-30: qty 15

## 2016-10-30 MED ORDER — GLYCOPYRROLATE 0.2 MG/ML IJ SOLN: 0.2000 mg | INTRAMUSCULAR | Status: DC | PRN

## 2016-10-30 MED ORDER — HALOPERIDOL LACTATE 2 MG/ML PO CONC
0.5000 mg | ORAL | Status: DC | PRN
  Filled 2016-10-30: qty 0.3

## 2016-10-30 MED ORDER — GLYCOPYRROLATE 0.2 MG/ML IJ SOLN
0.2000 mg | INTRAMUSCULAR | Status: DC | PRN
Start: 2016-10-30 — End: 2016-11-02

## 2016-10-30 MED ORDER — HALOPERIDOL 0.5 MG PO TABS
0.5000 mg | ORAL_TABLET | ORAL | Status: DC | PRN
  Filled 2016-10-30: qty 1

## 2016-10-30 MED ORDER — LORAZEPAM 1 MG PO TABS
1.0000 mg | ORAL_TABLET | ORAL | Status: DC | PRN
  Administered 2016-10-31: 1 mg via ORAL
  Filled 2016-10-30: qty 1

## 2016-10-30 NOTE — Progress Notes (Signed)
Patient ID: Lauris ChromanJames L Jonas, male   DOB: Jun 16, 1966, 50 y.o.   MRN: [id number] S:decision to transition to comfort measures noted O:BP (!) 87/62   Pulse 87   Temp 97.3 F (36.3 C) (Oral)   Resp (!) 29   Ht 5\' 7"  (1.702 m)   Wt 73.3 kg (161 lb 9.6 oz)   SpO2 99%   BMI 25.31 kg/m   Intake/Output Summary (Last 24 hours) at 10/30/16 0849 Last data filed at 10/30/16 0800  Gross per 24 hour  Intake             2566 ml  Output             1801 ml  Net              765 ml   Intake/Output: I/O last 3 completed shifts: In: 333756 [P.O.:2602; I.V.:954; IV Piggyback:200] Out: 2663 [Other:2663]  Intake/Output this shift:  Total I/O In: 26.5 [I.V.:26.5] Out: 22 [Other:22] Weight change:  Gen:WD AAM in NAD Ext:+edema   Recent Labs Lab 10/24/16 0547  10/25/16 0600  10/26/16 0500 10/27/16 0630 10/27/16 1600 10/28/16 0445 10/29/16 0530 10/29/16 1649 10/30/16 0343  NA  --   < > 132*  < > 131* 133* 130* 129* 125* 132* 126*  K  --   < > 4.4  < > 4.7 4.4 4.2 4.3 4.5 4.7 4.3  CL  --   < > 98*  < > 97* 99* 96* 95* 93* 99* 92*  CO2  --   < > 25  < > 24 25 25 23 24 24 22   GLUCOSE  --   < > 96  < > 102* 99 129* 181* 222* 106* 213*  BUN  --   < > 9  < > 8 8 9 7 7 6 7   CREATININE  --   < > 1.28*  < > 1.34* 1.39* 1.48* 1.44* 1.31* 1.41* 1.49*  ALBUMIN 2.5*  < > 2.6*  2.6*  < > 2.6*  2.8* 2.5*  2.5* 2.6* 2.3*  2.4* 2.4* 2.5* 2.4*  2.4*  CALCIUM  --   < > 8.6*  < > 8.7* 8.6* 8.3* 8.3* 7.8* 8.3* 8.1*  PHOS  --   < > 2.6  < > 2.9 2.5 2.2* 2.3* 2.1* 2.5 2.5  AST 23  --  27  --  35 39  --  39 46*  --  43*  ALT 6*  --  9*  --  6* 8*  --  7* 6*  --  8*  < > = values in this interval not displayed. Liver Function Tests:  Recent Labs Lab 10/28/16 0445 10/29/16 0530 10/29/16 1649 10/30/16 0343  AST 39 46*  --  43*  ALT 7* 6*  --  8*  ALKPHOS 68 83  --  64  BILITOT 16.9* 14.6*  --  15.7*  PROT 7.0 6.7  --  7.0  ALBUMIN 2.3*  2.4* 2.4* 2.5* 2.4*  2.4*   No results for input(s): LIPASE,  AMYLASE in the last 168 hours.  Recent Labs Lab 10/25/16 1120  AMMONIA 36*   CBC:  Recent Labs Lab 10/26/16 0500 10/27/16 0630 10/28/16 0445 10/29/16 0530 10/30/16 0343  WBC 17.2* 13.3* 11.3* 9.7 8.1  HGB 10.4* 10.2* 9.7* 9.1* 8.9*  HCT 30.2* 29.4* 28.4* 26.7* 26.4*  MCV 82.7 83.5 85.8 86.7 88.0  PLT 131* 132* 127* 111* 100*   Cardiac Enzymes: No results for input(s): CKTOTAL,  CKMB, CKMBINDEX, TROPONINI in the last 168 hours. CBG:  Recent Labs Lab 10/25/16 2213 10/26/16 0748 10/26/16 1125 10/26/16 1617 10/26/16 2114  GLUCAP 98 119* 171* 111* 95    Iron Studies: No results for input(s): IRON, TIBC, TRANSFERRIN, FERRITIN in the last 72 hours. Studies/Results: No results found. Marland Kitchen amiodarone  400 mg Oral BID  .  ceFAZolin (ANCEF) IV  2 g Intravenous Q12H  . midodrine  10 mg Oral TID WC  . senna-docusate  1 tablet Oral Daily  . sodium chloride flush  10-40 mL Intracatheter Q12H    BMET    Component Value Date/Time   NA 126 (L) 10/30/2016 0343   K 4.3 10/30/2016 0343   CL 92 (L) 10/30/2016 0343   CO2 22 10/30/2016 0343   GLUCOSE 213 (H) 10/30/2016 0343   BUN 7 10/30/2016 0343   CREATININE 1.49 (H) 10/30/2016 0343   CREATININE 1.08 02/13/2016 1115   CALCIUM 8.1 (L) 10/30/2016 0343   GFRNONAA 53 (L) 10/30/2016 0343   GFRAA >60 10/30/2016 0343   CBC    Component Value Date/Time   WBC 8.1 10/30/2016 0343   RBC 3.00 (L) 10/30/2016 0343   HGB 8.9 (L) 10/30/2016 0343   HCT 26.4 (L) 10/30/2016 0343   PLT 100 (L) 10/30/2016 0343   MCV 88.0 10/30/2016 0343   MCH 29.7 10/30/2016 0343   MCHC 33.7 10/30/2016 0343   RDW 27.7 (H) 10/30/2016 0343   LYMPHSABS 1.1 09/26/2016 0957   MONOABS 0.3 10/06/2016 0957   EOSABS 2.0 (H) 10/20/2016 0957   BASOSABS 0.1 10/22/2016 0957     Assessment/Plan:  1. AKI due to ischemic ATN.  Improving electrolytes on CVVHD, however plans to discontinue this today and proceed with comfort care. 2. Cardiogenic shock- on pressors  per Cardiology, EF 10%.  Not candidate for VAD or transplant. 3. Syncope due to VT 4. Disposition- poor overall prognosis and plan to proceed to comfort care today.  Will sign off.  Irena Cords, MD [employer] [phone]

## 2016-10-30 NOTE — Progress Notes (Signed)
Pt dyspneic, labored, gasping for air. RN asked pt if he was alright, pt stated he was "perfect". Pt is also becoming confused. RN asked pt if he wanted something to make him more comfortable, pt stated "yes please". 12.5mg  of Fentanyl administered.

## 2016-10-30 NOTE — Progress Notes (Signed)
Patient ID: Parker Johnson, male   DOB: December 11, 1965, 50 y.o.   MRN: [id number]    Advanced Heart Failure Rounding Note   Subjective:    Admitted with VT/syncope and profound cardiogenic shock. PICC placed with initial CO-OX 26%.   Underwent IABP and Trialysis cath placement on 11/17  On 11/19 developed sepsis. Bcx + for MSSA. Now on Ancef. F/u cultures negative.  IABP pulled 11/24. Swan out 11/25  Remains on Dobutamine 10 and norepi at 16 mcg. He is on midodrine 10 mg tid. SBPs in 80s.  Remains fatigued.  Wife present.  States he feels "perfect" this morning.     Co-ox remains depressed at 35.4% this am.  Remains on CVVH.  Remains anuric.    Remains in atrial tach 2:1 block.    F/u bcx are negative.   Objective:   Weight Range:  Vital Signs:   Temp:  [97.4 F (36.3 C)-98.4 F (36.9 C)] 98.4 F (36.9 C) (12/05 0400) Pulse Rate:  [87-96] 87 (12/05 0700) Resp:  [16-32] 19 (12/05 0700) BP: (58-114)/(13-78) 82/59 (12/05 0700) SpO2:  [88 %-100 %] 99 % (12/05 0700) Last BM Date: 10/29/16  Weight change: Filed Weights   10/27/16 0637 10/28/16 0400 10/29/16 0700  Weight: 154 lb 1.6 oz (69.9 kg) 156 lb 4.9 oz (70.9 kg) 161 lb 9.6 oz (73.3 kg)    Intake/Output:   Intake/Output Summary (Last 24 hours) at 10/30/16 0752 Last data filed at 10/30/16 0700  Gross per 24 hour  Intake             2566 ml  Output             1834 ml  Net              732 ml     Physical Exam: CVP > 20  General:  Fatigued. NAD.   HEENT: +Scleral Icterus. Normal otherwise.  Neck: supple. JVP remains elevated above ear. RIJ cordis in place. Carotids 2+ bilat; no bruits. No thyromegaly or nodule noted.  Cor: PMI nondisplaced. RRR. No murmurs or rubs. +S3 Lungs: Decreased throughout.  Abdomen: soft, NT, ND, no HSM. No bruits or masses. +BS  Extremities: no cyanosis, clubbing, rash  RUE PICC. No peripheral edema. + Ted hose. R femoral trialysis cath.  Neuro: awake. Moves all 4 extremities w/o  difficulty.   Telemetry: Reviewed, A-tach with 2:1 block.    Labs: Basic Metabolic Panel:  Recent Labs Lab 10/26/16 0500 10/27/16 0630 10/27/16 1600 10/28/16 0445 10/29/16 0530 10/29/16 1649 10/30/16 0343  NA 131* 133* 130* 129* 125* 132* 126*  K 4.7 4.4 4.2 4.3 4.5 4.7 4.3  CL 97* 99* 96* 95* 93* 99* 92*  CO2 24 25 25 23 24 24 22   GLUCOSE 102* 99 129* 181* 222* 106* 213*  BUN 8 8 9 7 7 6 7   CREATININE 1.34* 1.39* 1.48* 1.44* 1.31* 1.41* 1.49*  CALCIUM 8.7* 8.6* 8.3* 8.3* 7.8* 8.3* 8.1*  MG 2.5* 2.6*  --  2.4 2.5*  --  2.3  PHOS 2.9 2.5 2.2* 2.3* 2.1* 2.5 2.5    Liver Function Tests:  Recent Labs Lab 10/26/16 0500 10/27/16 0630 10/27/16 1600 10/28/16 0445 10/29/16 0530 10/29/16 1649 10/30/16 0343  AST 35 39  --  39 46*  --  43*  ALT 6* 8*  --  7* 6*  --  8*  ALKPHOS 74 74  --  68 83  --  64  BILITOT 20.0* 18.3*  --  16.9* 14.6*  --  15.7*  PROT 7.6 7.5  --  7.0 6.7  --  7.0  ALBUMIN 2.6*  2.8* 2.5*  2.5* 2.6* 2.3*  2.4* 2.4* 2.5* 2.4*  2.4*   No results for input(s): LIPASE, AMYLASE in the last 168 hours.  Recent Labs Lab 10/25/16 1120  AMMONIA 36*    CBC:  Recent Labs Lab 10/26/16 0500 10/27/16 0630 10/28/16 0445 10/29/16 0530 10/30/16 0343  WBC 17.2* 13.3* 11.3* 9.7 8.1  HGB 10.4* 10.2* 9.7* 9.1* 8.9*  HCT 30.2* 29.4* 28.4* 26.7* 26.4*  MCV 82.7 83.5 85.8 86.7 88.0  PLT 131* 132* 127* 111* 100*    Cardiac Enzymes: No results for input(s): CKTOTAL, CKMB, CKMBINDEX, TROPONINI in the last 168 hours.  BNP: BNP (last 3 results)  Recent Labs  05/16/16 0420 06/21/16 1218 10/04/2016 0957  BNP 2,453.6* 2,504.3* 3,542.3*    ProBNP (last 3 results) No results for input(s): PROBNP in the last 8760 hours.    Other results:  Imaging: No results found.   Medications:     Scheduled Medications: . amiodarone  400 mg Oral BID  .  ceFAZolin (ANCEF) IV  2 g Intravenous Q12H  . midodrine  10 mg Oral TID WC  . senna-docusate  1  tablet Oral Daily  . sodium chloride flush  10-40 mL Intracatheter Q12H    Infusions: . sodium chloride Stopped (10/23/16 0730)  . DOBUTamine 10 mcg/kg/min (10/29/16 1050)  . norepinephrine (LEVOPHED) Adult infusion 16 mcg/min (10/29/16 2100)  . dialysis replacement fluid (prismasate) 700 mL/hr at 10/29/16 2235  . dialysis replacement fluid (prismasate) 700 mL/hr at 10/29/16 2235  . dialysate (PRISMASATE) 1,500 mL/hr at 10/29/16 2255    PRN Medications: acetaminophen, bisacodyl, fentaNYL (SUBLIMAZE) injection, heparin, heparin, magnesium hydroxide, ondansetron (ZOFRAN) IV, sodium chloride, sodium chloride flush, traMADol   Assessment/Plan/Discussion    1. 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  NICM. ECHO this admission EF 10% with severe RV dilation and dysfunction. Weight decreased with CVVHD. Remains anuric. Remains very symptomatic.   - He remains on norepinephrine at 16 mcg,dobutamine 10 mcg, and midodrine 10 mg TID.  - Plan is to proceed to comfort care this afternoon with family present.   - CVP remains elevated. Remains on CVVHD.  - Not candidate for VAD or transplant so have not pursued ECMO (Would need heart/kidney. Had ongoing tobacco use on admit and last cocaine use about 3 months ago. Blood type B+). - He continues to deteriorate with multi-system organ failure. He is terminally ill.  - Pt remains anuric with depressed mixed venous saturation.  - Continue current dobutamine and norepi to support until this evening as arranged by family/palliative care. 2. Acute on CKD III:   - Anuric. Remains on CVVH. (started 11/17).  3. PAF/atrial tach:  - Had transient CHB, now looks like atrial tachycardia with 2:1 block.  4. H/O LV thrombus 5. H/o polysubstance abuse (ETOH, cocaine, tobacco use) 6. Hyperkalemia: Resolved CVVH.  9. MSSA sepsis: Now on Ancef.  10. Syncope: VT [employer].  Device interrogation showed  multiple episodes of VT. Loading amio per EP. K repleted. No further VT over the last few days.  11. Melena: - No further. Hgb stable.  12. Hyperbilirubinemia:  - Rifampin and Amio potential causative agents. Rifampin stopped. Bilirubin very high today, RV failure likely big contributor.   Palliative care following closely.  Plan on proceed to comfort care today, 10/30/16 at 1600.  Suspect patient  will pass in following hours to days.    Length of Stay: [address]  Graciella Freer, [place] 7:52 AM  10/30/2016   Advanced Heart Failure Team Pager [phone] (M-F; 7a - 4p)  Please contact [facility] for night-coverage after hours (4p -7a ) and weekends on [email or web address]   Patient seen and examined with Otilio Saber, PA-C. We discussed all aspects of the encounter. I agree with the assessment and plan as stated above.   He remains critically ill with multisystem organ failure. Anuric. Co-ox very low. Long talk with him and his family today. Will plan switch to comfort care today with removal of CVVHD and stopping pressors this afternoon. Comfort meds available.   The patient is critically ill with multiple organ systems failure and requires high complexity decision making for assessment and support, frequent evaluation and titration of therapies, application of advanced monitoring technologies and extensive interpretation of multiple databases.   Critical Care Time devoted to patient care services described in this note is 35 Minutes.  Jaliya Siegmann,MD 10:12 AM

## 2016-10-30 NOTE — Progress Notes (Signed)
Pt CRRT filter clotted at this time. No blood was able to be returned. Pt understands that filter is not going to be changed. Pt spoke with Dr Gala Romney about not restarting CRRT. Rn will continue to monitor pt.

## 2016-10-30 NOTE — Progress Notes (Signed)
Patient Name: Parker Johnson      SUBJECTIVE: Parker Johnson is comfortable today. He was sitting with his wife.     Past Medical History:  Diagnosis Date  . AICD (automatic cardioverter/defibrillator) present    [employer] 100  . Alcohol abuse    hx  . Cardiomyopathy    secondary  . CHF (congestive heart failure) (HCC)   . Chronic anticoagulation    Coumadin for hx LV thrombus and PAF  . Gout   . HTN (hypertension)    uncontrolled  . Tobacco abuse   . Ventricular tachycardia (HCC)     Scheduled Meds:  Scheduled Meds: . amiodarone  400 mg Oral BID  .  ceFAZolin (ANCEF) IV  2 g Intravenous Q12H  . midodrine  10 mg Oral TID WC  . senna-docusate  1 tablet Oral Daily  . sodium chloride flush  10-40 mL Intracatheter Q12H   Continuous Infusions: . sodium chloride Stopped (10/23/16 0730)  . DOBUTamine 10 mcg/kg/min (10/30/16 1257)  . norepinephrine (LEVOPHED) Adult infusion 16 mcg/min (10/30/16 0800)   acetaminophen, bisacodyl, fentaNYL (SUBLIMAZE) injection, heparin, heparin, magnesium hydroxide, ondansetron (ZOFRAN) IV, sodium chloride flush, traMADol    PHYSICAL EXAM Vitals:   10/30/16 0900 10/30/16 1000 10/30/16 1100 10/30/16 1200  BP: (!) 86/72  (!) 86/60 (!) 76/63  Pulse: 88 87 89 88  Resp: (!) 26 17 20  (!) 22  Temp:      TempSrc:      SpO2: 98% 99% 96% 96%  Weight:      Height:        Well developed and nourished in no acute distress HENT normal Neck supplle Clear Regular rate and rhythm, no murmurs or gallops Abd-soft with active BS No Clubbing cyanosis edema Skin-warm and dry A & Oriented  Grossly normal sensory and motor function        Intake/Output Summary (Last 24 hours) at 10/30/16 1320 Last data filed at 10/30/16 1200  Gross per 24 hour  Intake           2059.5 ml  Output             1583 ml  Net            476.5 ml    LABS: Basic Metabolic Panel:  Recent Labs Lab 10/26/16 0500 10/27/16 0630  10/27/16 1600 10/28/16 0445 10/29/16 0530 10/29/16 1649 10/30/16 0343  NA 131* 133* 130* 129* 125* 132* 126*  K 4.7 4.4 4.2 4.3 4.5 4.7 4.3  CL 97* 99* 96* 95* 93* 99* 92*  CO2 24 25 25 23 24 24 22   GLUCOSE 102* 99 129* 181* 222* 106* 213*  BUN 8 8 9 7 7 6 7   CREATININE 1.34* 1.39* 1.48* 1.44* 1.31* 1.41* 1.49*  CALCIUM 8.7* 8.6* 8.3* 8.3* 7.8* 8.3* 8.1*  MG 2.5* 2.6*  --  2.4 2.5*  --  2.3  PHOS 2.9 2.5 2.2* 2.3* 2.1* 2.5 2.5   Cardiac Enzymes: No results for input(s): CKTOTAL, CKMB, CKMBINDEX, TROPONINI in the last 72 hours. CBC:  Recent Labs Lab 10/24/16 0547 10/25/16 0600 10/26/16 0500 10/27/16 0630 10/28/16 0445 10/29/16 0530 10/30/16 0343  WBC 11.2* 11.4* 17.2* 13.3* 11.3* 9.7 8.1  HGB 11.5* 10.6* 10.4* 10.2* 9.7* 9.1* 8.9*  HCT 31.7* 29.6* 30.2* 29.4* 28.4* 26.7* 26.4*  MCV 79.1 80.7 82.7 83.5 85.8 86.7 88.0  PLT 142* 142* 131* 132* 127* 111* 100*   PROTIME:  Recent Labs  10/28/16 0445 10/29/16 0530 10/30/16 0343  LABPROT 26.5* 27.4* 29.1*  INR 2.39 2.49 2.68   Liver Function Tests:  Recent Labs  10/29/16 0530 10/29/16 1649 10/30/16 0343  AST 46*  --  43*  ALT 6*  --  8*  ALKPHOS 83  --  64  BILITOT 14.6*  --  15.7*  PROT 6.7  --  7.0  ALBUMIN 2.4* 2.5* 2.4*  2.4*   No results for input(s): LIPASE, AMYLASE in the last 72 hours. BNP: BNP (last 3 results)  Recent Labs  05/16/16 0420 06/21/16 1218 10/22/2016 0957  BNP 2,453.6* 2,504.3* 3,542.3*    ProBNP (last 3 results) No results for input(s): PROBNP in the last 8760 hours.  D-Dimer: No results for input(s): DDIMER in the last 72 hours. Hemoglobin A1C: No results for input(s): HGBA1C in the last 72 hours. Fasting Lipid Panel: No results for input(s): CHOL, HDL, LDLCALC, TRIG, CHOLHDL, LDLDIRECT in the last 72 hours. Thyroid Function Tests: No results for input(s): TSH, T4TOTAL, T3FREE, THYROIDAB in the last 72 hours.  Invalid input(s): FREET3 Anemia Panel: No results for  input(s): VITAMINB12, FOLATE, FERRITIN, TIBC, IRON, RETICCTPCT in the last 72 hours.    ASSESSMENT AND PLAN:  Parker Johnson and his wife and I sat down this morning again discussing the upcoming removal of therapy and short of miracle this would bring forth is dying  It is been my privilege to care for this man    Signed, Sherryl MangesSteven Earleen Aoun MD  10/30/2016

## 2016-10-30 NOTE — Progress Notes (Signed)
Daily Progress Note   Patient Name: Parker Johnson       Date: 10/30/2016 DOB: [birth year]  Age: 50 y.o. MRN#: [id number] Attending Physician: Deboraha Sprang, MD Primary Care Physician: Charolette Forward, MD Admit Date: 10/11/2016  Reason for Consultation/Follow-up: Establishing goals of care, Non pain symptom management, Pain control, Terminal Care and Withdrawal of life-sustaining treatment  Life limiting illness: end stage CHF, now with multi-system organ failure.   Subjective: This afternoon, patient with family surrounding him. Patient appears weak and fatigued, he is resting eyes closed in chair.  Denies any complaints, does not feel short of breath, does not complain of pain anywhere.  Wife and mother are at the bedside in addition to multiple other family members.  We discussed plan moving forward for comfort care at length. Providing anticipatory counseling on what to expect over the next few hours to days.  Length of Stay: 20  Current Medications: Scheduled Meds:  . sodium chloride flush  10-40 mL Intracatheter Q12H    Continuous Infusions: . sodium chloride Stopped (10/23/16 0730)    PRN Meds: acetaminophen **OR** acetaminophen, antiseptic oral rinse, bisacodyl, fentaNYL (SUBLIMAZE) injection, glycopyrrolate **OR** glycopyrrolate **OR** glycopyrrolate, haloperidol **OR** haloperidol **OR** haloperidol lactate, LORazepam **OR** LORazepam **OR** LORazepam, ondansetron **OR** ondansetron (ZOFRAN) IV, polyvinyl alcohol, sodium chloride flush  Physical Exam         Fatigued, chronically ill appearing,  S1 S2 Shallow clear breathing anteriorly Abdomen soft No edema Awake alert, oriented and answered all questions appropriately this AM.  He was sleeping this afternoon after  receiving pain medication. Vital Signs: BP (!) 75/48   Pulse 98   Temp 97.3 F (36.3 C) (Oral)   Resp 20   Ht 5' 7"  (1.702 m)   Wt 73.3 kg (161 lb 9.6 oz)   SpO2 92%   BMI 25.31 kg/m  SpO2: SpO2: 92 % O2 Device: O2 Device: Nasal Cannula O2 Flow Rate: O2 Flow Rate (L/min): 2 L/min  Intake/output summary:   Intake/Output Summary (Last 24 hours) at 10/30/16 1727 Last data filed at 10/30/16 1600  Gross per 24 hour  Intake          1840.95 ml  Output             1300 ml  Net  540.95 ml   LBM: Last BM Date: 10/30/16 Baseline Weight: Weight: 81.6 kg (180 lb) Most recent weight: Weight: 73.3 kg (161 lb 9.6 oz)       Palliative Assessment/Data:    Flowsheet Rows   Flowsheet Row Most Recent Value  Intake Tab  Referral Department  Cardiology  Unit at Time of Referral  Cardiac/Telemetry Unit  Palliative Care Primary Diagnosis  Cardiac  Date Notified  10/28/16  Palliative Care Type  Return patient Palliative Care  Reason for referral  Clarify Goals of Care, End of [place], Counsel Regarding Hospice  Date of Admission  10/13/2016  Date first seen by Palliative Care  10/28/16  # of days Palliative referral response time  0 Day(s)  # of days IP prior to Palliative referral  18  Clinical Assessment  Palliative Performance Scale Score  30%  Pain Max last 24 hours  5  Pain Min Last 24 hours  4  Dyspnea Max Last 24 Hours  4  Dyspnea Min Last 24 hours  3  Nausea Max Last 24 Hours  4  Nausea Min Last 24 Hours  3  Psychosocial & Spiritual Assessment  Palliative Care Outcomes  Patient/Family meeting held?  Yes  Who was at the meeting?  patient wife mother   Palliative Care Outcomes  Clarified goals of care, Changed to focus on comfort      Patient Active Problem List   Diagnosis Date Noted  . Encounter for palliative care   . Counseling regarding end of life decision making   . Hyperbilirubinemia   . Fever and chills   . Syncope and collapse   .  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 Acute renal failure ([place])   . Central line infection   . Septic shock ([place]) 10/17/2016  . Staphylococcus aureus bacteremia 10/17/2016  . Atrial tachycardia ([place]) 10/17/2016  . Cardiogenic shock ([place])   . AKI (acute kidney injury) ([place])   . Ventricular fibrillation ([place]) 10/16/2016  . Hypokalemia 05/13/2016  . CHF (congestive heart failure) ([place]) 05/13/2016  . Chest pain 05/13/2016  . Chronic anticoagulation   . AICD -DDD  [employer] MADIT-RIT 03/13/2011  . TOBACCO ABUSE 07/29/2009  . HYPERTENSION, UNCONTROLLED 07/29/2009  . CARDIOMYOPATHY, SECONDARY 07/29/2009  . VENTRICULAR TACHYCARDIA 07/29/2009  . PAROXYSMAL ATRIAL FIBRILLATION 07/29/2009  . CHF 07/29/2009  . 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 Gang and his daughter this AM.  Discussed plan for full comfort care with discontinuation of pressors and ICD this afternoon.  Met again with him and his family this afternoon to discuss plan for full comfort care.  His ICD has been deactivated and pressors stopped.  I updated orders for focus on comfort including addition of medications as needed from comfort.  Family is opposed to morphine so will continue with fentanyl as needed.  I also added ativan, haldol, and robinul for use as needed,  Prognosis of hours to days, with hospital death anticipated. Patient and family are aware that he remains critically ill with multi system organ  failure.     Code Status:    Code Status Orders         Start     Ordered   10/14/16 1345  Limited resuscitation (code)  Continuous    Question Answer Comment  In the event of cardiac or respiratory ARREST: Initiate Code Blue, Call Rapid Response Yes   In the event of cardiac or respiratory ARREST: Perform CPR No   In the event of cardiac or respiratory ARREST: Perform Intubation/Mechanical Ventilation No   In the event of cardiac or respiratory ARREST: Use NIPPV/BiPAp only if indicated Yes   In the event of cardiac or respiratory ARREST: Administer ACLS medications if indicated Yes   In the event of cardiac or respiratory ARREST: Perform Defibrillation or Cardioversion if indicated Yes      10/14/16 1345    Code Status History    Date Active Date Inactive Code Status Order ID Comments User Context   09/29/2016  3:58 PM 10/14/2016  1:45 PM Full Code [id number]  Conrad [place], NP Inpatient   05/13/2016 12:28 PM 05/18/2016  5:07 PM Full Code [id number]  Lonn Georgia, PA-C ED   12/28/2015 11:28 PM 01/01/2016  5:30 PM Full Code [id number]  Charolette Forward, MD Inpatient   10/01/2015 10:17 PM 10/05/2015 10:46 PM Full Code [id number]  Dixie Dials, MD ED   05/19/2015  6:24 PM 05/23/2015  8:43 PM Full Code [id number]  Charolette Forward, MD Inpatient   03/13/2015  3:36 PM 03/17/2015  6:44 PM Full Code [id number]  Charolette Forward, MD ED   03/09/2014  7:18 PM 03/14/2014  4:26 PM Full Code [id number]  Charolette Forward, MD Inpatient   09/12/2013 10:57 PM 09/17/2013  5:50 PM Full Code [id number]  Charolette Forward, MD Inpatient       Prognosis:   Hours - Days  Discharge Planning:  Anticipated Hospital Death  Care plan was discussed with  Patient, wife, mother, and multiple family members.   Thank you for allowing the Palliative Medicine Team to assist in the care of this patient.   Time In: 1540 Time Out: 1620 Total Time 40 Prolonged Time Billed no      Greater than 50%  of this time was spent counseling and coordinating care related to the above assessment and plan.  Micheline Rough, MD  Please contact Palliative Medicine Team phone at [phone] for questions and concerns.

## 2016-10-31 DIAGNOSIS — Z515 Encounter for palliative care: Secondary | ICD-10-CM

## 2016-10-31 DIAGNOSIS — R0602 Shortness of breath: Secondary | ICD-10-CM

## 2016-10-31 MED ORDER — HALOPERIDOL LACTATE 5 MG/ML IJ SOLN: 0.5000 mg | INTRAMUSCULAR | Status: DC | PRN

## 2016-10-31 MED ORDER — HALOPERIDOL 1 MG PO TABS
0.5000 mg | ORAL_TABLET | ORAL | Status: DC | PRN
  Filled 2016-10-31: qty 1

## 2016-10-31 MED ORDER — HALOPERIDOL LACTATE 2 MG/ML PO CONC
1.0000 mg | ORAL | Status: DC | PRN
  Filled 2016-10-31: qty 0.5

## 2016-10-31 MED ORDER — BACLOFEN 10 MG PO TABS
5.0000 mg | ORAL_TABLET | Freq: Three times a day (TID) | ORAL | Status: DC
  Administered 2016-10-31 (×3): 5 mg via ORAL
  Filled 2016-10-31 (×6): qty 1

## 2016-10-31 NOTE — Progress Notes (Signed)
Report called to [place], RN on [address]

## 2016-10-31 NOTE — Progress Notes (Signed)
Daily Progress Note   Patient Name: Parker Johnson       Date: 10/31/2016 DOB: 1966-03-18  Age: 50 y.o. MRN#: [id number] Attending Physician: Deboraha Sprang, MD Primary Care Physician: Charolette Forward, MD Admit Date: 10/01/2016  Reason for Consultation/Follow-up: Establishing goals of care, Non pain symptom management, Pain control, Terminal Care and Withdrawal of life-sustaining treatment  Life limiting illness: end stage CHF, now with multi-system organ failure.   Subjective: Seen this AM with his wife. Patient appears weak and fatigued, he is resting eyes closed in chair.  Denies any complaints, does not feel short of breath, does not complain of pain anywhere.  We discussed plan moving forward for continued focus on comfort comfort care. Answered questions to the best of my ability and provided anticipatory counseling.  Length of Stay: 21  Current Medications: Scheduled Meds:  . sodium chloride flush  10-40 mL Intracatheter Q12H    Continuous Infusions: . sodium chloride Stopped (10/23/16 0730)    PRN Meds: acetaminophen **OR** acetaminophen, antiseptic oral rinse, bisacodyl, fentaNYL (SUBLIMAZE) injection, glycopyrrolate **OR** glycopyrrolate **OR** glycopyrrolate, haloperidol **OR** haloperidol **OR** haloperidol lactate, LORazepam **OR** LORazepam **OR** LORazepam, ondansetron **OR** ondansetron (ZOFRAN) IV, polyvinyl alcohol, sodium chloride flush  Physical Exam         Fatigued, chronically ill appearing, weaker and less interactive today, but answered questions appropriately  S1 S2 Shallow clear breathing anteriorly Abdomen soft No edema. Vital Signs: BP (!) 65/44   Pulse 83   Temp 97.3 F (36.3 C) (Oral)   Resp 16   Ht 5' 7"  (1.702 m)   Wt 73.3 kg (161 lb 9.6 oz)    SpO2 99%   BMI 25.31 kg/m  SpO2: SpO2: 99 % O2 Device: O2 Device: Not Delivered O2 Flow Rate: O2 Flow Rate (L/min): 2 L/min  Intake/output summary:   Intake/Output Summary (Last 24 hours) at 10/31/16 0914 Last data filed at 10/30/16 1600  Gross per 24 hour  Intake           420.45 ml  Output               26 ml  Net           394.45 ml   LBM: Last BM Date: 10/30/16 Baseline Weight: Weight: 81.6 kg (180 lb) Most recent weight: Weight: 73.3  kg (161 lb 9.6 oz)       Palliative Assessment/Data:    Flowsheet Rows   Flowsheet Row Most Recent Value  Intake Tab  Referral Department  Cardiology  Unit at Time of Referral  Cardiac/Telemetry Unit  Palliative Care Primary Diagnosis  Cardiac  Date Notified  10/28/16  Palliative Care Type  Return patient Palliative Care  Reason for referral  Clarify Goals of Care, End of [place], Counsel Regarding Hospice  Date of Admission  10/20/2016  Date first seen by Palliative Care  10/28/16  # of days Palliative referral response time  0 Day(s)  # of days IP prior to Palliative referral  18  Clinical Assessment  Palliative Performance Scale Score  30%  Pain Max last 24 hours  5  Pain Min Last 24 hours  4  Dyspnea Max Last 24 Hours  4  Dyspnea Min Last 24 hours  3  Nausea Max Last 24 Hours  4  Nausea Min Last 24 Hours  3  Psychosocial & Spiritual Assessment  Palliative Care Outcomes  Patient/Family meeting held?  Yes  Who was at the meeting?  patient wife mother   Palliative Care Outcomes  Clarified goals of care, Changed to focus on comfort      Patient Active Problem List   Diagnosis Date Noted  . Terminal care   . Encounter for palliative care   . Goals of care, counseling/discussion   . Hyperbilirubinemia   . Fever and chills   . Syncope and collapse   . 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 Acute renal failure ([place])   . Central line infection   . Septic shock ([place]) 10/17/2016  . Staphylococcus aureus bacteremia  10/17/2016  . Atrial tachycardia ([place]) 10/17/2016  . Cardiogenic shock ([place])   . AKI (acute kidney injury) ([place])   . Ventricular fibrillation ([place]) 10/01/2016  . Hypokalemia 05/13/2016  . CHF (congestive heart failure) ([place]) 05/13/2016  . Chest pain 05/13/2016  . Chronic anticoagulation   . AICD -DDD  [employer] MADIT-RIT 03/13/2011  . TOBACCO ABUSE 07/29/2009  . HYPERTENSION, UNCONTROLLED 07/29/2009  . CARDIOMYOPATHY, SECONDARY 07/29/2009  . VENTRICULAR TACHYCARDIA 07/29/2009  . PAROXYSMAL ATRIAL FIBRILLATION 07/29/2009  . CHF 07/29/2009  . ALCOHOL ABUSE, HX OF 07/29/2009    Palliative Care Assessment & Plan   Patient Profile:   Assessment: End stage acute on chronic biventricular heart failure Anuria Acute on chronic CKD III MSSA sepsis Syncope Generalized weakness   Recommendations/Plan: - Full comfort care - Pain/shortness of breath: Fentanyl as needed. Has been using intermittently with good effect.  If frequency of doses continues to increase, could consider continuous infusion.  Patient reports that current regimen is working well for him now and wants to continue same. - Anxiety: Ativan as needed - Agitation or nausea: Haldol as needed - Excess secretions: Robinul as needed  PLAN: I met with Mr. Taras and his wife this AM.  He denies complaints or needs this AM.  He is able to participate in conversation, but appears weaker than during our encounter yesterday.  His ICD has been deactivated and pressors stopped.  Has been getting intermittent fentanyl with good control of symptoms per his report.  Family is opposed to morphine so will continue with fentanyl as needed.  I also added ativan, haldol, and robinul for use as needed, but he has not required with any regularity to this point.  Prognosis of hours to days, with hospital death anticipated. I agree with  plan to transition from ICU (likely to 6N).    Code Status:    Code Status Orders         Start     Ordered   10/14/16 1345  Limited resuscitation (code)  Continuous    Question Answer Comment  In the event of cardiac or respiratory ARREST: Initiate Code Blue, Call Rapid Response Yes   In the event of cardiac or respiratory ARREST: Perform CPR No   In the event of cardiac or respiratory ARREST: Perform Intubation/Mechanical Ventilation No   In the event of cardiac or respiratory ARREST: Use NIPPV/BiPAp only if indicated Yes   In the event of cardiac or respiratory ARREST: Administer ACLS medications if indicated Yes   In the event of cardiac or respiratory ARREST: Perform Defibrillation or Cardioversion if indicated Yes      10/14/16 1345    Code Status History    Date Active Date Inactive Code Status Order ID Comments User Context   10/14/2016  3:58 PM 10/14/2016  1:45 PM Full Code [id number]  Conrad [place], NP Inpatient   05/13/2016 12:28 PM 05/18/2016  5:07 PM Full Code [id number]  Lonn Georgia, PA-C ED   12/28/2015 11:28 PM 01/01/2016  5:30 PM Full Code [id number]  Charolette Forward, MD Inpatient   10/01/2015 10:17 PM 10/05/2015 10:46 PM Full Code [id number]  Dixie Dials, MD ED   05/19/2015  6:24 PM 05/23/2015  8:43 PM Full Code [id number]  Charolette Forward, MD Inpatient   03/13/2015  3:36 PM 03/17/2015  6:44 PM Full Code [id number]  Charolette Forward, MD ED   03/09/2014  7:18 PM 03/14/2014  4:26 PM Full Code [id number]  Charolette Forward, MD Inpatient   09/12/2013 10:57 PM 09/17/2013  5:50 PM Full Code [id number]  Charolette Forward, MD Inpatient       Prognosis:   Hours - Days  Discharge Planning:  Anticipated Hospital Death  Care plan was discussed with  Patient and wife Thank you for allowing the Palliative Medicine Team to assist in the care of this patient.   Time In: 0850 Time Out: 0920 Total Time 30 Prolonged Time Billed no      Greater than 50%  of this time was spent counseling and coordinating care related to the above assessment and plan.  Micheline Rough, MD  Please contact Palliative  Medicine Team phone at [phone] for questions and concerns.

## 2016-10-31 NOTE — Progress Notes (Signed)
I satwith Parker Johnson and his wife for about 35 minutes this am just sitting and talking and .Marland KitchenMarland Kitchen

## 2016-10-31 NOTE — Progress Notes (Signed)
Received call that patient has been having hiccups and was requesting something for this.  Will plan for trial of baclofen 5mg  TID.  If this is ineffective, consider chlorpromazine (25mg  TID) or haldol, but this would likely increase sedation more than baclofen and will therefore trial baclofen first.   Romie Minus, MD [facility] Palliative Medicine Team [phone]

## 2016-10-31 NOTE — Progress Notes (Signed)
Patient ID: Parker Johnson, male   DOB: January 19, 1966, 50 y.o.   MRN: [id number]    Advanced Heart Failure Rounding Note   Subjective:    Admitted with VT/syncope and profound cardiogenic shock. PICC placed with initial CO-OX 26%.   Underwent IABP and Trialysis cath placement on 11/17  On 11/19 developed sepsis. Bcx + for MSSA. Now on Ancef. F/u cultures negative.  IABP pulled 11/24. Swan out 11/25  CVVHD stopped am of 10/30/16. Transitioned to full comfort care evening of 10/30/16.  Family present in room. Pt very fatigued.   No labs. No tele.   Objective:   Weight Range:  Vital Signs:   Temp:  [97.3 F (36.3 C)] 97.3 F (36.3 C) (12/05 0821) Pulse Rate:  [65-98] 83 (12/05 2200) Resp:  [15-29] 16 (12/05 2200) BP: (58-87)/(39-72) 65/44 (12/05 2200) SpO2:  [92 %-99 %] 99 % (12/05 2200) Weight:  [161 lb 9.6 oz (73.3 kg)] 161 lb 9.6 oz (73.3 kg) (12/05 0800) Last BM Date: 10/30/16  Weight change: Filed Weights   10/28/16 0400 10/29/16 0700 10/30/16 0800  Weight: 156 lb 4.9 oz (70.9 kg) 161 lb 9.6 oz (73.3 kg) 161 lb 9.6 oz (73.3 kg)    Intake/Output:   Intake/Output Summary (Last 24 hours) at 10/31/16 0751 Last data filed at 10/30/16 1600  Gross per 24 hour  Intake           473.45 ml  Output               80 ml  Net           393.45 ml     Physical Exam:  General:  Very fatigued. Reclined in chair.    HEENT: +Scleral Icterus. Normal otherwise.  Neck: supple. JVP with prominent V wave and elevated above ear. RIJ cordis in place. Carotids 2+ bilat; no bruits. No thyromegaly or nodule noted.  Cor: PMI nondisplaced. RRR. No murmurs or rubs. +S3 Lungs: Diminished throughout.  Abdomen: soft, NT, ND, no HSM. No bruits or masses. +BS   Extremities: no cyanosis, clubbing. RUE PICC. 1-2+ peripheral edema. Ted hose in place. R femoral trialysis cath.  Neuro: awake. Moves all 4 extremities w/o difficulty.   Telemetry: Reviewed, Atrial tach 2:1 block.  Labs: Basic  Metabolic Panel:  Recent Labs Lab 10/26/16 0500 10/27/16 0630 10/27/16 1600 10/28/16 0445 10/29/16 0530 10/29/16 1649 10/30/16 0343  NA 131* 133* 130* 129* 125* 132* 126*  K 4.7 4.4 4.2 4.3 4.5 4.7 4.3  CL 97* 99* 96* 95* 93* 99* 92*  CO2 24 25 25 23 24 24 22   GLUCOSE 102* 99 129* 181* 222* 106* 213*  BUN 8 8 9 7 7 6 7   CREATININE 1.34* 1.39* 1.48* 1.44* 1.31* 1.41* 1.49*  CALCIUM 8.7* 8.6* 8.3* 8.3* 7.8* 8.3* 8.1*  MG 2.5* 2.6*  --  2.4 2.5*  --  2.3  PHOS 2.9 2.5 2.2* 2.3* 2.1* 2.5 2.5    Liver Function Tests:  Recent Labs Lab 10/26/16 0500 10/27/16 0630 10/27/16 1600 10/28/16 0445 10/29/16 0530 10/29/16 1649 10/30/16 0343  AST 35 39  --  39 46*  --  43*  ALT 6* 8*  --  7* 6*  --  8*  ALKPHOS 74 74  --  68 83  --  64  BILITOT 20.0* 18.3*  --  16.9* 14.6*  --  15.7*  PROT 7.6 7.5  --  7.0 6.7  --  7.0  ALBUMIN 2.6*  2.8* 2.5*  2.5* 2.6* 2.3*  2.4* 2.4* 2.5* 2.4*  2.4*   No results for input(s): LIPASE, AMYLASE in the last 168 hours.  Recent Labs Lab 10/25/16 1120  AMMONIA 36*    CBC:  Recent Labs Lab 10/26/16 0500 10/27/16 0630 10/28/16 0445 10/29/16 0530 10/30/16 0343  WBC 17.2* 13.3* 11.3* 9.7 8.1  HGB 10.4* 10.2* 9.7* 9.1* 8.9*  HCT 30.2* 29.4* 28.4* 26.7* 26.4*  MCV 82.7 83.5 85.8 86.7 88.0  PLT 131* 132* 127* 111* 100*    Cardiac Enzymes: No results for input(s): CKTOTAL, CKMB, CKMBINDEX, TROPONINI in the last 168 hours.  BNP: BNP (last 3 results)  Recent Labs  05/16/16 0420 06/21/16 1218 October 20, 2016 0957  BNP 2,453.6* 2,504.3* 3,542.3*    ProBNP (last 3 results) No results for input(s): PROBNP in the last 8760 hours.    Other results:  Imaging: No results found.   Medications:     Scheduled Medications: . sodium chloride flush  10-40 mL Intracatheter Q12H    Infusions: . sodium chloride Stopped (10/23/16 0730)    PRN Medications: acetaminophen **OR** acetaminophen, antiseptic oral rinse, bisacodyl, fentaNYL  (SUBLIMAZE) injection, glycopyrrolate **OR** glycopyrrolate **OR** glycopyrrolate, haloperidol **OR** haloperidol **OR** haloperidol lactate, LORazepam **OR** LORazepam **OR** LORazepam, ondansetron **OR** ondansetron (ZOFRAN) IV, polyvinyl alcohol, sodium chloride flush   Assessment/Plan/Discussion    1. 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  NICM. ECHO this admission EF 10% with severe RV dilation and dysfunction. Weight decreased with CVVHD. Remains anuric. Remains very symptomatic.   - No further escalation of care. Now full comfort.  - Not candidate for VAD or transplant so have not pursued ECMO (Would need heart/kidney. Had ongoing tobacco use on admit and last cocaine use about 3 months ago. Blood type B+). 2. Acute on CKD III:   - Remains anuric. CVVH stopped with comfort care.   3. PAF/atrial tach:  - Had transient CHB, then progressed to atrial tachycardia with 2:1 block.  - Can discontinue telemetry. 4. H/O LV thrombus 5. H/o polysubstance abuse (ETOH, cocaine, tobacco use) 6. Hyperkalemia: Resolved CVVH.  9. MSSA sepsis 10. Syncope: VT [employer].  Device interrogation showed multiple episodes of VT.  No further VT over the last few days.  11. Melena: - No further.  12. Hyperbilirubinemia  No labs. Continue full comfort care.  Transfer to 6N vs 5W.  Anticipate hospital death in next 24-48 hours.   Length of Stay: [address]  Graciella Freer, [place] 7:51 AM  10/31/2016   Advanced Heart Failure Team Pager [phone] (M-F; 7a - 4p)  Please contact [facility] for night-coverage after hours (4p -7a ) and weekends on [email or web address]  Agree with above.  May transfer to palliative care setting.  Suspect he will expire in hospital.    Marca Ancona 10/31/2016 8:00 AM

## 2016-10-31 NOTE — Progress Notes (Signed)
Patient arrived to unit in bedside chair. Patient alert and denies pain. Patient and family oriented to room and plan of care. Comfort Cart ordered. Romeo Apple, RN to resume care of patient.

## 2016-11-01 MED ORDER — SODIUM CHLORIDE 0.9 % IV SOLN
200.0000 ug/h | INTRAVENOUS | Status: DC
  Administered 2016-11-01: 75 ug/h via INTRAVENOUS
  Filled 2016-11-01 (×3): qty 50

## 2016-11-01 NOTE — Progress Notes (Signed)
Patient ID: Parker ChromanJames L Johnson, male   DOB: 09/22/1966, 50 y.o.   MRN: [id number]    Advanced Heart Failure Rounding Note   Subjective:    Admitted with VT/syncope and profound cardiogenic shock. PICC placed with initial CO-OX 26%.   Underwent IABP and Trialysis cath placement on 11/17  On 11/19 developed sepsis. Bcx + for MSSA. Now on Ancef. F/u cultures negative.  IABP pulled 11/24. Swan out 11/25  CVVHD stopped am of 10/30/16. Transitioned to full comfort care evening of 10/30/16.  Very tenuous this am.  SBP in 70s. Remains very fatigued. Stirs to questioning but rapidly falls asleep. Denies discomfort but grimaces with moving around in bed.  Denies SOB.    Objective:   Weight Range:  Vital Signs:   Temp:  [97.4 F (36.3 C)-97.8 F (36.6 C)] 97.8 F (36.6 C) (12/07 0605) Pulse Rate:  [67-84] 67 (12/07 0605) Resp:  [11-17] 11 (12/06 2250) BP: (63-74)/(45-56) 74/56 (12/07 0605) SpO2:  [92 %-100 %] 92 % (12/07 0605) Last BM Date: 10/31/16  Weight change: Filed Weights   10/28/16 0400 10/29/16 0700 10/30/16 0800  Weight: 156 lb 4.9 oz (70.9 kg) 161 lb 9.6 oz (73.3 kg) 161 lb 9.6 oz (73.3 kg)    Intake/Output:   Intake/Output Summary (Last 24 hours) at 11/01/16 1155 Last data filed at 11/01/16 0858  Gross per 24 hour  Intake              600 ml  Output              200 ml  Net              400 ml     Physical Exam: General:  Very fatigued. In bed. Chronically ill appearing.    HEENT: +Scleral Icterus. Normal otherwise.  Neck: supple. JVP above ear. RIJ cordis in place. Carotids 2+ bilat; no bruits. No thyromegaly or nodule noted. + Accessory muscle use Cor: PMI nondisplaced. RRR. No murmurs or rubs. +S3 Lungs: Decreased throughout.   Abdomen: soft, NT, ND, no HSM. No bruits or masses. +BS  Extremities: no cyanosis, clubbing. RUE PICC. 1-2+ peripheral edema. Ted hose in place. R femoral trialysis cath.  Neuro: Awake. Moves all 4 extremities w/o difficulty.    Telemetry: Reviewed, Atrial tach 2:1 block.  Labs: Basic Metabolic Panel:  Recent Labs Lab 10/26/16 0500 10/27/16 0630 10/27/16 1600 10/28/16 0445 10/29/16 0530 10/29/16 1649 10/30/16 0343  NA 131* 133* 130* 129* 125* 132* 126*  K 4.7 4.4 4.2 4.3 4.5 4.7 4.3  CL 97* 99* 96* 95* 93* 99* 92*  CO2 24 25 25 23 24 24 22   GLUCOSE 102* 99 129* 181* 222* 106* 213*  BUN 8 8 9 7 7 6 7   CREATININE 1.34* 1.39* 1.48* 1.44* 1.31* 1.41* 1.49*  CALCIUM 8.7* 8.6* 8.3* 8.3* 7.8* 8.3* 8.1*  MG 2.5* 2.6*  --  2.4 2.5*  --  2.3  PHOS 2.9 2.5 2.2* 2.3* 2.1* 2.5 2.5    Liver Function Tests:  Recent Labs Lab 10/26/16 0500 10/27/16 0630 10/27/16 1600 10/28/16 0445 10/29/16 0530 10/29/16 1649 10/30/16 0343  AST 35 39  --  39 46*  --  43*  ALT 6* 8*  --  7* 6*  --  8*  ALKPHOS 74 74  --  68 83  --  64  BILITOT 20.0* 18.3*  --  16.9* 14.6*  --  15.7*  PROT 7.6 7.5  --  7.0 6.7  --  7.0  ALBUMIN 2.6*  2.8* 2.5*  2.5* 2.6* 2.3*  2.4* 2.4* 2.5* 2.4*  2.4*   No results for input(s): LIPASE, AMYLASE in the last 168 hours. No results for input(s): AMMONIA in the last 168 hours.  CBC:  Recent Labs Lab 10/26/16 0500 10/27/16 0630 10/28/16 0445 10/29/16 0530 10/30/16 0343  WBC 17.2* 13.3* 11.3* 9.7 8.1  HGB 10.4* 10.2* 9.7* 9.1* 8.9*  HCT 30.2* 29.4* 28.4* 26.7* 26.4*  MCV 82.7 83.5 85.8 86.7 88.0  PLT 131* 132* 127* 111* 100*    Cardiac Enzymes: No results for input(s): CKTOTAL, CKMB, CKMBINDEX, TROPONINI in the last 168 hours.  BNP: BNP (last 3 results)  Recent Labs  05/16/16 0420 06/21/16 1218 10/21/2016 0957  BNP 2,453.6* 2,504.3* 3,542.3*    ProBNP (last 3 results) No results for input(s): PROBNP in the last 8760 hours.    Other results:  Imaging: No results found.   Medications:     Scheduled Medications: . baclofen  5 mg Oral TID  . sodium chloride flush  10-40 mL Intracatheter Q12H    Infusions: . sodium chloride Stopped (10/23/16 0730)     PRN Medications: acetaminophen **OR** acetaminophen, antiseptic oral rinse, bisacodyl, fentaNYL (SUBLIMAZE) injection, glycopyrrolate **OR** glycopyrrolate **OR** glycopyrrolate, haloperidol **OR** haloperidol **OR** haloperidol lactate, LORazepam **OR** LORazepam **OR** LORazepam, ondansetron **OR** ondansetron (ZOFRAN) IV, polyvinyl alcohol, sodium chloride flush   Assessment/Plan/Discussion    1. 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  NICM. ECHO this admission EF 10% with severe RV dilation and dysfunction. Weight decreased with CVVHD. Remains anuric. Remains very symptomatic.   - No further escalation of care. Now full comfort.  - Not candidate for VAD or transplant so have not pursued ECMO (Would need heart/kidney. Had ongoing tobacco use on admit and last cocaine use about 3 months ago. Blood type B+). 2. Acute on CKD III:   - He appears to have minimal urine output yesterday, per patient happened along with BM. - No labs. Remains full comfort care.    3. PAF/atrial tach:  - Had transient CHB, then progressed to atrial tachycardia with 2:1 block.  - Can discontinue telemetry. 4. H/O LV thrombus 5. H/o polysubstance abuse (ETOH, cocaine, tobacco use) 6. Hyperkalemia: Resolved CVVH.  9. MSSA sepsis 10. Syncope: VT [employer].  Device interrogation showed multiple episodes of VT.  No further VT over the last few days.  11. Melena: - No further.  12. Hyperbilirubinemia  Pt is full comfort. He is much more sleepy today.  To me, he has increased WOB with accessory muscle use and increasing lethargic. He does not yet appear uremic.  Will check back shortly and have asked nurse to follow closely.  I suspect he will need additional comfort meds this evening.  Length of Stay: [address]  Mariam Dollar [place], [place] 11:55 AM  11/01/2016   Advanced Heart Failure Team Pager [phone] (M-F; 7a - 4p)  Please contact [facility]  for night-coverage after hours (4p -7a ) and weekends on [email or web address]  Patient seen and examined with Otilio Saber, PA-C. We discussed all aspects of the encounter. I agree with the assessment and plan as stated above.   He is sitting up in chair. Lethargic but awakens and will converse. Visibly dyspneic. I spoke with him and his wife. He realizes that he is near death. We talked more about comfort care. They are agreeable to starting Fentanyl drip. Will start at 75. Can titrate as needed. I discussed  with his nurse as well. He will pass soon. Appreciate Palliative Care teams help.   Bensimhon, Daniel,MD 6:54 PM

## 2016-11-01 NOTE — Progress Notes (Signed)
Daily Progress Note   Patient Name: Parker Johnson       Date: 11/01/2016 DOB: Apr 05, 1966  Age: 50 y.o. MRN#: [id number] Attending Physician: Duke Salvia, MD Primary Care Physician: Rinaldo Cloud, MD Admit Date: Oct 24, 2016  Reason for Consultation/Follow-up: Establishing goals of care, Non pain symptom management, Pain control, Terminal Care and Withdrawal of life-sustaining treatment  Life limiting illness: end stage CHF, now with multi-system organ failure.   Subjective: Parker Johnson is lying in bed.  Family reports that he is no longer waking much.  He did not arouse during my encounter.  We discussed plan moving forward for continued focus on comfort comfort care. Answered questions to the best of my ability and provided anticipatory counseling.  Length of Stay: 22  Current Medications: Scheduled Meds:  . baclofen  5 mg Oral TID  . sodium chloride flush  10-40 mL Intracatheter Q12H    Continuous Infusions: . sodium chloride Stopped (10/23/16 0730)    PRN Meds: acetaminophen **OR** acetaminophen, antiseptic oral rinse, bisacodyl, fentaNYL (SUBLIMAZE) injection, glycopyrrolate **OR** glycopyrrolate **OR** glycopyrrolate, haloperidol **OR** haloperidol **OR** haloperidol lactate, LORazepam **OR** LORazepam **OR** LORazepam, ondansetron **OR** ondansetron (ZOFRAN) IV, polyvinyl alcohol, sodium chloride flush  Physical Exam         Somnolent, lying in bed.  No acute distress S1 S2 Shallow clear breathing anteriorly Abdomen soft + edema Vital Signs: BP (!) 75/54 (BP Location: Left Arm)   Pulse 88   Temp 97.8 F (36.6 C) (Axillary)   Resp 14   Ht 5\' 7"  (1.702 m)   Wt 73.3 kg (161 lb 9.6 oz)   SpO2 100%   BMI 25.31 kg/m  SpO2: SpO2: 100 % O2 Device: O2 Device: Nasal  Cannula O2 Flow Rate: O2 Flow Rate (L/min): 2 L/min  Intake/output summary:   Intake/Output Summary (Last 24 hours) at 11/01/16 1613 Last data filed at 11/01/16 1500  Gross per 24 hour  Intake              670 ml  Output              200 ml  Net              470 ml   LBM: Last BM Date: 10/31/16 Baseline Weight: Weight: 81.6 kg (180 lb) Most recent weight: Weight:  73.3 kg (161 lb 9.6 oz)       Palliative Assessment/Data:    Flowsheet Rows   Flowsheet Row Most Recent Value  Intake Tab  Referral Department  Cardiology  Unit at Time of Referral  Cardiac/Telemetry Unit  Palliative Care Primary Diagnosis  Cardiac  Date Notified  10/28/16  Palliative Care Type  Return patient Palliative Care  Reason for referral  Clarify Goals of Care, End of Life Care Assistance, Counsel Regarding Hospice  Date of Admission  2015-12-07  Date first seen by Palliative Care  10/28/16  # of days Palliative referral response time  0 Day(s)  # of days IP prior to Palliative referral  18  Clinical Assessment  Palliative Performance Scale Score  30%  Pain Max last 24 hours  5  Pain Min Last 24 hours  4  Dyspnea Max Last 24 Hours  4  Dyspnea Min Last 24 hours  3  Nausea Max Last 24 Hours  4  Nausea Min Last 24 Hours  3  Psychosocial & Spiritual Assessment  Palliative Care Outcomes  Patient/Family meeting held?  Yes  Who was at the meeting?  patient wife mother   Palliative Care Outcomes  Clarified goals of care, Changed to focus on comfort      Patient Active Problem List   Diagnosis Date Noted  . Shortness of breath   . Terminal care   . Encounter for palliative care   . Goals of care, counseling/discussion   . Hyperbilirubinemia   . Fever and chills   . Syncope and collapse   . Infected defibrillator (HCC)   . Acute renal failure (HCC)   . Central line infection   . Septic shock (HCC) 10/17/2016  . Staphylococcus aureus bacteremia 10/17/2016  . Atrial tachycardia (HCC) 10/17/2016  .  Cardiogenic shock (HCC)   . AKI (acute kidney injury) (HCC)   . Ventricular fibrillation (HCC) 11-25-16  . Hypokalemia 05/13/2016  . CHF (congestive heart failure) (HCC) 05/13/2016  . Chest pain 05/13/2016  . Chronic anticoagulation   . AICD -DDD  [employer] MADIT-RIT 03/13/2011  . TOBACCO ABUSE 07/29/2009  . HYPERTENSION, UNCONTROLLED 07/29/2009  . CARDIOMYOPATHY, SECONDARY 07/29/2009  . VENTRICULAR TACHYCARDIA 07/29/2009  . PAROXYSMAL ATRIAL FIBRILLATION 07/29/2009  . CHF 07/29/2009  . ALCOHOL ABUSE, HX OF 07/29/2009    Palliative Care Assessment & Plan   Patient Profile:   Assessment: End stage acute on chronic biventricular heart failure Anuria Acute on chronic CKD III MSSA sepsis Syncope Generalized weakness   Recommendations/Plan: - Full comfort care - Pain/shortness of breath: Fentanyl as needed. If frequency of doses increases, could consider continuous infusion.  - Anxiety: Ativan as needed - Agitation or nausea: Haldol as needed - Excess secretions: Robinul as needed  PLAN: Parker Johnson is continuing to progress in dying process.  He did not awaken during my encounter today.  His ICD has been deactivated and pressors stopped.  Has been getting intermittent fentanyl with good control of symptoms.  Family is opposed to morphine so will continue with fentanyl as needed.  I also added ativan, haldol, and robinul for use as needed, but he has not required with any regularity to this point.  Prognosis of hours to days, with hospital death anticipated.   Code Status:    Code Status Orders        Start     Ordered   10/14/16 1345  Limited resuscitation (code)  Continuous    Question Answer Comment  In  the event of cardiac or respiratory ARREST: Initiate Code Blue, Call Rapid Response Yes   In the event of cardiac or respiratory ARREST: Perform CPR No   In the event of cardiac or respiratory ARREST: Perform Intubation/Mechanical Ventilation No   In the  event of cardiac or respiratory ARREST: Use NIPPV/BiPAp only if indicated Yes   In the event of cardiac or respiratory ARREST: Administer ACLS medications if indicated Yes   In the event of cardiac or respiratory ARREST: Perform Defibrillation or Cardioversion if indicated Yes      10/14/16 1345    Code Status History    Date Active Date Inactive Code Status Order ID Comments User Context   09/28/2016  3:58 PM 10/14/2016  1:45 PM Full Code [id number]  Sherald Hess, NP Inpatient   05/13/2016 12:28 PM 05/18/2016  5:07 PM Full Code [id number]  Darrol Jump, PA-C ED   12/28/2015 11:28 PM 01/01/2016  5:30 PM Full Code [id number]  Rinaldo Cloud, MD Inpatient   10/01/2015 10:17 PM 10/05/2015 10:46 PM Full Code [id number]  Orpah Cobb, MD ED   05/19/2015  6:24 PM 05/23/2015  8:43 PM Full Code [id number]  Rinaldo Cloud, MD Inpatient   03/13/2015  3:36 PM 03/17/2015  6:44 PM Full Code [id number]  Rinaldo Cloud, MD ED   03/09/2014  7:18 PM 03/14/2014  4:26 PM Full Code [id number]  Rinaldo Cloud, MD Inpatient   09/12/2013 10:57 PM 09/17/2013  5:50 PM Full Code [id number]  Rinaldo Cloud, MD Inpatient       Prognosis:   Hours - Days  Discharge Planning:  Anticipated Hospital Death  Care plan was discussed with  Patient family Thank you for allowing the Palliative Medicine Team to assist in the care of this patient.   Time In: 1440 Time Out: 1500 Total Time 20 Prolonged Time Billed no      Greater than 50%  of this time was spent counseling and coordinating care related to the above assessment and plan.  Romie Minus, MD  Please contact Palliative Medicine Team phone at [phone] for questions and concerns.

## 2016-11-01 NOTE — Progress Notes (Addendum)
Patient was assisted to bedside commode by his wife, and wife turned her back to get a washcloth and he got up off commode and fell onto floor. Patient states he is not hurt, vitals in sync with what he's been running, MD Neale Burly notified, no intervention at this time. Bed alarm placed on the patient and was told to not get up even with his wife there alone.

## 2016-11-06 ENCOUNTER — Encounter (HOSPITAL_COMMUNITY): Payer: Self-pay

## 2016-11-06 NOTE — Progress Notes (Signed)
Certificate of death completed and signed by Dr. Gala Romney. [facility] notified that completed certificate ready to pick up today. Certificate placed in death certificate folder in CHF clinic.  Ave Filter, RN

## 2016-11-26 NOTE — Progress Notes (Signed)
   11/30/16 1346  Notifications  Bed Control notified that Post Mortem checklist is complete Yes  Bed Control notified body transferred Transported to morgue  Attending Physican Contact  Attending Physician Notified Y  Attending Physician Name Parker Johnson  Will the above attending physician sign death certificate?  (non ED physician) Yes  Post Mortem Checklist  Date of Death 11/30/2016  Time of Death 1340  Pronounced By Parker Johnson  Next of kin notified Yes  Name of next of kin notified of death Parker Johnson  Contact Person's Relationship to Patient Spouse  Contact Person's Phone Number [phone]  Was the patient a No Code Blue or a Limited Code Blue? Yes  Did the patient die unattended? No  Patient restrained? Not applicable  Weight 73.3 kg (161 lb 9.6 oz)  [place] [facility]  Notification Date 11/30/2016  Notification Time 1359  [place] [facility] Number [phone]  Is patient a potential donor? Y  Donation Type Tissue;Eyes  Eye prep completed Yes  Autopsy  Autopsy requested by N/A  Patient Belongings/Medications Returned  Patient belongings from bedside/safe/pharmacy returned  Yes  Valuables returned to? wife  Medical Examiner  Is this a medical examiner's case? [facility] home name/address/phone # [facility] - [address] [place] - [phone]

## 2016-11-26 NOTE — Progress Notes (Signed)
Witnessed a waste of fentanyl drip 35 ml.

## 2016-11-26 NOTE — Progress Notes (Signed)
Wasted the remainder of fentanyl drip of 35 ml/hr into sink, witnessed by Sharyn Dross, RN.

## 2016-11-26 NOTE — Discharge Summary (Signed)
Advanced Heart Failure Death Summary   DeathSummary   Patient ID: Parker Johnson MRN: [id number], DOB/AGE: [birth year] 51 y.o. Admit date: 10/16/2016 D/C date:     10/31/2016   Primary Discharge Diagnoses:  1. 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 Acute renal failure on CKD stage III 3. PAF/Atrial tach 4. H/o LV thrombus 5. H/o Polysubstance abuse 6. MSSA sepsis 7. Syncope/VT storm 8. Melena 9 Hyperbilirubinemia  Hospital Course:   [name] was a 51 yo male with PMH of Chronic systolic HF due to NICM, PAF, and CKD. He was admitted for advanced HF in 6/17 and required milrinone for low-output physiology which was eventually weaned off.   He presented to [facility] via EMS on 10/05/2016 after an episode syncope -> fall.  Pt reported several weeks of syncopal episodes preceded by dizziness as well as worsening HF symptoms with peripheral edema and abdominal distention. He was seen by the EP Service in the ED and ICD interrogation revealed several episodes of VT treated with ICD over the past month. Pt noted to have electrolyte abnormalitiees on labs, as well as being non-compliant with his amiodarone since 02/2016. Started on IV amiodarone. The HF team was consulted due to the presence of recurrent severe HF.   A PICC was placed which showed a CVP of 30 an coox 26% suggestive of severe biventricular HF with cardiogenic shock physiology. Echo 10/20/16 with EF 5-10% and severe RV dysfunction. Pt started on milrinone 0.375 mcg + norepi 5 mcg.  Sluggish UO noted and continued to have high burden of ventricular ectopy.  Lidocaine gtt added to amio.   Pt received high dose lasix (480 mg total) 10/11/16 with worsening creatinine and continued poor urine output. The possibility of ECMO support was considered but given patient's recent substance abuse, noncompliance and severe biventricular dysfunction precluding VAD therapy he was not felt to be  a candidate.  With ongoing decompensation an IABP and trialysis catheter were placed on 20-Oct-2016. Renal was consulted and CVVHD was initiated with the hopes that patient's overall condition and renal function might improve with volume removal.   Patients mixed venous saturation remained marginal to low despite multiple pressors, mechanical support and the removal of over 30 pounds of fluid. On 11/19/1 patient developed sepsis. Vanc/Ceftaz started and cultures. BCx + for MSSA bacteremia. ID consulted and ABX narrowed to Ancef. His lines were changed and his Trialysis catheter switched to an indwelling catheter for longer term support.   Unfortunately, he failed to make any progress despite aggressive support and had persistent shock physiology and anuric renal failure. After long discussions with patient and his family with the support of the Palliative Care team he was transitioned to Comfort Care,    Pt tolerated CVVHD pulling 200/hr but CVP remained elevated > 20.  Pressors and CVVHD rate adjusted as needed for pressure control. Pt remained remained weak and cool, and had DOE with minimal exertion for the majority of his hospital stay.   Required pressor support of Dobutamine up to 7 mcg/kg/min and Norepi up to 40. Coox remained severely depressed despite this, mostly ranging in the 30-40s.  IABP wean initiated 10/17/16 with pt being aware that if he failed wean, would need switch to palliative care. He was placed on fenatnyl and ativan for comfort and passed quietly on 11/08/2016.   Signed, Parker Freer, PA-C 10/30/2016, 1:54 PM  Agree with above.   Parker Warriner,MD 9:09 PM

## 2016-11-26 NOTE — Progress Notes (Signed)
Patient ID: Parker Johnson, male   DOB: [birth year], 51 y.o.   MRN: [id number]    Advanced Heart Failure Rounding Note   Subjective:    Admitted with VT/syncope and profound cardiogenic shock. PICC placed with initial CO-OX 26%.   Underwent IABP and Trialysis cath placement on 11/17  On 11/19 developed sepsis. Bcx + for MSSA. Now on Ancef. F/u cultures negative.  IABP pulled 11/24. Swan out 11/25  CVVHD stopped am of 10/30/16. Transitioned to full comfort care evening of 10/30/16.  Placed on fentanyl gtt last night. Now up to 100 mcg/hr.  Pt does not awaken on exam.  Breathing 8-10 times a minute per my count.  Not particularly labored or agonal.   Objective:   Weight Range:  Vital Signs:   Temp:  [97.8 F (36.6 C)-98.4 F (36.9 C)] 98.4 F (36.9 C) (12/08 0459) Pulse Rate:  [40-88] 40 (12/08 0459) Resp:  [14] 14 (12/07 1341) BP: (74-79)/(47-59) 74/47 (12/08 0459) SpO2:  [95 %-100 %] 95 % (12/08 0459) Last BM Date: 11/01/16  Weight change: Filed Weights   10/28/16 0400 10/29/16 0700 10/30/16 0800  Weight: 156 lb 4.9 oz (70.9 kg) 161 lb 9.6 oz (73.3 kg) 161 lb 9.6 oz (73.3 kg)    Intake/Output:   Intake/Output Summary (Last 24 hours) at 11/13/2016 1033 Last data filed at 11/03/2016 0900  Gross per 24 hour  Intake            63.08 ml  Output                0 ml  Net            63.08 ml     Physical Exam: General:  Not responsive to verbal or physical (non-painful) stimuli. HEENT: Normal Neck: supple. JVP above ear. Carotids 2+ bilat; no bruits. No thyromegaly or nodule noted. + Accessory muscle use Cor: PMI nondisplaced. Irregularly irregular. +S3 Lungs: Diminished throughout.  Abdomen: soft, , ND, no HSM. No bruits or masses. +BS  Extremities: no cyanosis, clubbing. RUE PICC. 2+ peripheral edema. Ted hose in place. Neuro: Asleep. Does not stir to stimuli.  Labs: Basic Metabolic Panel:  Recent Labs Lab 10/27/16 0630 10/27/16 1600 10/28/16 0445 10/29/16 0530  10/29/16 1649 10/30/16 0343  NA 133* 130* 129* 125* 132* 126*  K 4.4 4.2 4.3 4.5 4.7 4.3  CL 99* 96* 95* 93* 99* 92*  CO2 25 25 23 24 24 22   GLUCOSE 99 129* 181* 222* 106* 213*  BUN 8 9 7 7 6 7   CREATININE 1.39* 1.48* 1.44* 1.31* 1.41* 1.49*  CALCIUM 8.6* 8.3* 8.3* 7.8* 8.3* 8.1*  MG 2.6*  --  2.4 2.5*  --  2.3  PHOS 2.5 2.2* 2.3* 2.1* 2.5 2.5    Liver Function Tests:  Recent Labs Lab 10/27/16 0630 10/27/16 1600 10/28/16 0445 10/29/16 0530 10/29/16 1649 10/30/16 0343  AST 39  --  39 46*  --  43*  ALT 8*  --  7* 6*  --  8*  ALKPHOS 74  --  68 83  --  64  BILITOT 18.3*  --  16.9* 14.6*  --  15.7*  PROT 7.5  --  7.0 6.7  --  7.0  ALBUMIN 2.5*  2.5* 2.6* 2.3*  2.4* 2.4* 2.5* 2.4*  2.4*   No results for input(s): LIPASE, AMYLASE in the last 168 hours. No results for input(s): AMMONIA in the last 168 hours.  CBC:  Recent Labs Lab 10/27/16 0630  10/28/16 0445 10/29/16 0530 10/30/16 0343  WBC 13.3* 11.3* 9.7 8.1  HGB 10.2* 9.7* 9.1* 8.9*  HCT 29.4* 28.4* 26.7* 26.4*  MCV 83.5 85.8 86.7 88.0  PLT 132* 127* 111* 100*    Cardiac Enzymes: No results for input(s): CKTOTAL, CKMB, CKMBINDEX, TROPONINI in the last 168 hours.  BNP: BNP (last 3 results)  Recent Labs  05/16/16 0420 06/21/16 1218 09/27/2016 0957  BNP 2,453.6* 2,504.3* 3,542.3*    ProBNP (last 3 results) No results for input(s): PROBNP in the last 8760 hours.    Other results:  Imaging: No results found.   Medications:     Scheduled Medications: . baclofen  5 mg Oral TID  . sodium chloride flush  10-40 mL Intracatheter Q12H    Infusions: . sodium chloride Stopped (10/23/16 0730)  . fentaNYL infusion INTRAVENOUS 100 mcg/hr (Nov 14, 2016 0128)    PRN Medications: acetaminophen **OR** acetaminophen, antiseptic oral rinse, bisacodyl, fentaNYL (SUBLIMAZE) injection, glycopyrrolate **OR** glycopyrrolate **OR** glycopyrrolate, haloperidol **OR** haloperidol **OR** haloperidol lactate,  LORazepam **OR** LORazepam **OR** LORazepam, ondansetron **OR** ondansetron (ZOFRAN) IV, polyvinyl alcohol, sodium chloride flush   Assessment/Plan/Discussion    1. 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  NICM. ECHO this admission EF 10% with severe RV dilation and dysfunction. Weight decreased with CVVHD. Remains anuric. Remains very symptomatic.   - Continue full comfort care. Suspect patient will pass today.  2. Acute on CKD III:   - Anuric.  3. PAF/atrial tach:  - Off of telemetry with comfort care. Back in afib on exam.  4. H/O LV thrombus 5. H/o polysubstance abuse (ETOH, cocaine, tobacco use) 6. MSSA sepsis 7 Syncope: VT [employer].  Device interrogation showed multiple episodes of VT. Had been quiescent prior to comfort care.  8. Melena - No further.  9. Hyperbilirubinemia  Pt not awakening this am on Fentanyl drip. Suspect he is very near to death. His respiratory rate is concerning for discomfort. Wife does not want to escalate comfort care until family who was coming to see him this morning has arrived. Have asked wife get any family who wishes to seem him here as soon as possible and for Nurse to page at that time.   Length of Stay: [address]  Mariam Dollar [place], [place] 10:33 AM  Nov 14, 2016   Advanced Heart Failure Team Pager [phone] (M-F; 7a - 4p)  Please contact [facility] for night-coverage after hours (4p -7a ) and weekends on [email or web address]  Patient seen and examined with Otilio Saber, PA-C. We discussed all aspects of the encounter. I agree with the assessment and plan as stated above.   Patient terminally ill. On Fentanyl gtt. Appears comfortable. Can titrate as needed. Will likely pass today.   Trena Dunavan, Reuel Boom

## 2016-11-26 NOTE — Progress Notes (Signed)
Daily Progress Note   Patient Name: Parker Johnson       Date: 11/09/2016 DOB: 1966/08/08  Age: 51 y.o. MRN#: [id number] Attending Physician: Duke Salvia, MD Primary Care Physician: Rinaldo Cloud, MD Admit Date: 10/08/2016  Reason for Consultation/Follow-up: Establishing goals of care, Non pain symptom management, Pain control, Terminal Care and Withdrawal of life-sustaining treatment  Life limiting illness: end stage CHF, now with multi-system organ failure.   Subjective: Parker Johnson is lying in bed.  Wife is present and reports he has been resting comfortably since starting fentanyl infusion. She reports that he is no longer waking.  He did not arouse during my encounter.  She reports being at peace with everything and knowing that we are approaching death.  Expressed a great deal of appreciation for care he has received this admission.  Length of Stay: 23  Current Medications: Scheduled Meds:  . baclofen  5 mg Oral TID  . sodium chloride flush  10-40 mL Intracatheter Q12H    Continuous Infusions: . sodium chloride Stopped (10/23/16 0730)  . fentaNYL infusion INTRAVENOUS 100 mcg/hr (11/01/2016 0128)    PRN Meds: acetaminophen **OR** acetaminophen, antiseptic oral rinse, bisacodyl, fentaNYL (SUBLIMAZE) injection, glycopyrrolate **OR** glycopyrrolate **OR** glycopyrrolate, haloperidol **OR** haloperidol **OR** haloperidol lactate, LORazepam **OR** LORazepam **OR** LORazepam, ondansetron **OR** ondansetron (ZOFRAN) IV, polyvinyl alcohol, sodium chloride flush  Physical Exam         Somnolent, lying in bed.  No acute distress S1 S2 Shallow clear breathing anteriorly. Some apnea of a few seconds noted. Abdomen soft + edema Vital Signs: BP (!) 74/47 (BP Location: Left Arm)   Pulse  (!) 40   Temp 98.4 F (36.9 C) (Axillary)   Resp 14   Ht 5\' 7"  (1.702 m)   Wt 73.3 kg (161 lb 9.6 oz)   SpO2 95%   BMI 25.31 kg/m  SpO2: SpO2: 95 % O2 Device: O2 Device: Not Delivered O2 Flow Rate: O2 Flow Rate (L/min): 2 L/min  Intake/output summary:   Intake/Output Summary (Last 24 hours) at 11/12/2016 0924 Last data filed at 11/03/2016 0400  Gross per 24 hour  Intake            73.08 ml  Output                0 ml  Net            73.08 ml   LBM: Last BM Date: 11/01/16 Baseline Weight: Weight: 81.6 kg (180 lb) Most recent weight: Weight: 73.3 kg (161 lb 9.6 oz)       Palliative Assessment/Data:    Flowsheet Rows   Flowsheet Row Most Recent Value  Intake Tab  Referral Department  Cardiology  Unit at Time of Referral  Cardiac/Telemetry Unit  Palliative Care Primary Diagnosis  Cardiac  Date Notified  10/28/16  Palliative Care Type  Return patient Palliative Care  Reason for referral  Clarify Goals of Care, End of Life Care Assistance, Counsel Regarding Hospice  Date of Admission  10/07/2016  Date first seen by Palliative Care  10/28/16  # of days Palliative referral response time  0 Day(s)  # of days IP prior to Palliative referral  18  Clinical Assessment  Palliative Performance Scale Score  30%  Pain Max last 24 hours  5  Pain Min Last 24 hours  4  Dyspnea Max Last 24 Hours  4  Dyspnea Min Last 24 hours  3  Nausea Max Last 24 Hours  4  Nausea Min Last 24 Hours  3  Psychosocial & Spiritual Assessment  Palliative Care Outcomes  Patient/Family meeting held?  Yes  Who was at the meeting?  patient wife mother   Palliative Care Outcomes  Clarified goals of care, Changed to focus on comfort      Patient Active Problem List   Diagnosis Date Noted  . Shortness of breath   . Terminal care   . Encounter for palliative care   . Goals of care, counseling/discussion   . Hyperbilirubinemia   . Fever and chills   . Syncope and collapse   . Infected defibrillator (HCC)    . Acute renal failure (HCC)   . Central line infection   . Septic shock (HCC) 10/17/2016  . Staphylococcus aureus bacteremia 10/17/2016  . Atrial tachycardia (HCC) 10/17/2016  . Cardiogenic shock (HCC)   . AKI (acute kidney injury) (HCC)   . Ventricular fibrillation (HCC) 10/18/2016  . Hypokalemia 05/13/2016  . CHF (congestive heart failure) (HCC) 05/13/2016  . Chest pain 05/13/2016  . Chronic anticoagulation   . AICD -DDD  [employer] MADIT-RIT 03/13/2011  . TOBACCO ABUSE 07/29/2009  . HYPERTENSION, UNCONTROLLED 07/29/2009  . CARDIOMYOPATHY, SECONDARY 07/29/2009  . VENTRICULAR TACHYCARDIA 07/29/2009  . PAROXYSMAL ATRIAL FIBRILLATION 07/29/2009  . CHF 07/29/2009  . ALCOHOL ABUSE, HX OF 07/29/2009    Palliative Care Assessment & Plan   Patient Profile:   Assessment: End stage acute on chronic biventricular heart failure Anuria Acute on chronic CKD III MSSA sepsis Syncope Generalized weakness   Recommendations/Plan: - Full comfort care - Pain/shortness of breath: Fentanyl continuous infusion.  Continue to titrate as needed to ensure comfort.  - Anxiety: Ativan as needed - Agitation or nausea: Haldol as needed - Excess secretions: Robinul as needed  PLAN: Parker Johnson is actively dying.  He appears comfortable.  He did not awaken during my encounter today.  Prognosis of hours to days, with hospital death.   Code Status:    Code Status Orders        Start     Ordered   10/14/16 1345  Limited resuscitation (code)  Continuous    Question Answer Comment  In the event of cardiac or respiratory ARREST: Initiate Code Blue, Call Rapid Response Yes   In the event of cardiac or respiratory ARREST: Perform  CPR No   In the event of cardiac or respiratory ARREST: Perform Intubation/Mechanical Ventilation No   In the event of cardiac or respiratory ARREST: Use NIPPV/BiPAp only if indicated Yes   In the event of cardiac or respiratory ARREST: Administer ACLS  medications if indicated Yes   In the event of cardiac or respiratory ARREST: Perform Defibrillation or Cardioversion if indicated Yes      10/14/16 1345    Code Status History    Date Active Date Inactive Code Status Order ID Comments User Context   November 05, 2016  3:58 PM 10/14/2016  1:45 PM Full Code [id number]  Sherald Hess, NP Inpatient   05/13/2016 12:28 PM 05/18/2016  5:07 PM Full Code [id number]  Darrol Jump, PA-C ED   12/28/2015 11:28 PM 01/01/2016  5:30 PM Full Code [id number]  Rinaldo Cloud, MD Inpatient   10/01/2015 10:17 PM 10/05/2015 10:46 PM Full Code [id number]  Orpah Cobb, MD ED   05/19/2015  6:24 PM 05/23/2015  8:43 PM Full Code [id number]  Rinaldo Cloud, MD Inpatient   03/13/2015  3:36 PM 03/17/2015  6:44 PM Full Code [id number]  Rinaldo Cloud, MD ED   03/09/2014  7:18 PM 03/14/2014  4:26 PM Full Code [id number]  Rinaldo Cloud, MD Inpatient   09/12/2013 10:57 PM 09/17/2013  5:50 PM Full Code [id number]  Rinaldo Cloud, MD Inpatient       Prognosis:   Hours - Days  Discharge Planning:  Anticipated Hospital Death  Care plan was discussed with  Patient family Thank you for allowing the Palliative Medicine Team to assist in the care of this patient.   Time In: 0900 Time Out: 0920 Total Time 20 Prolonged Time Billed no      Greater than 50%  of this time was spent counseling and coordinating care related to the above assessment and plan.  Romie Minus, MD  Please contact Palliative Medicine Team phone at [phone] for questions and concerns.

## 2016-11-26 DEATH — deceased

## 2018-02-15 IMAGING — CR DG CHEST 1V PORT
1 series · 1 of 1 positions shown · non-contrast
Comparison: 10/10/2016 at [DATE]

CLINICAL DATA: PICC line placement

EXAM:
PORTABLE CHEST 1 VIEW

[AP]
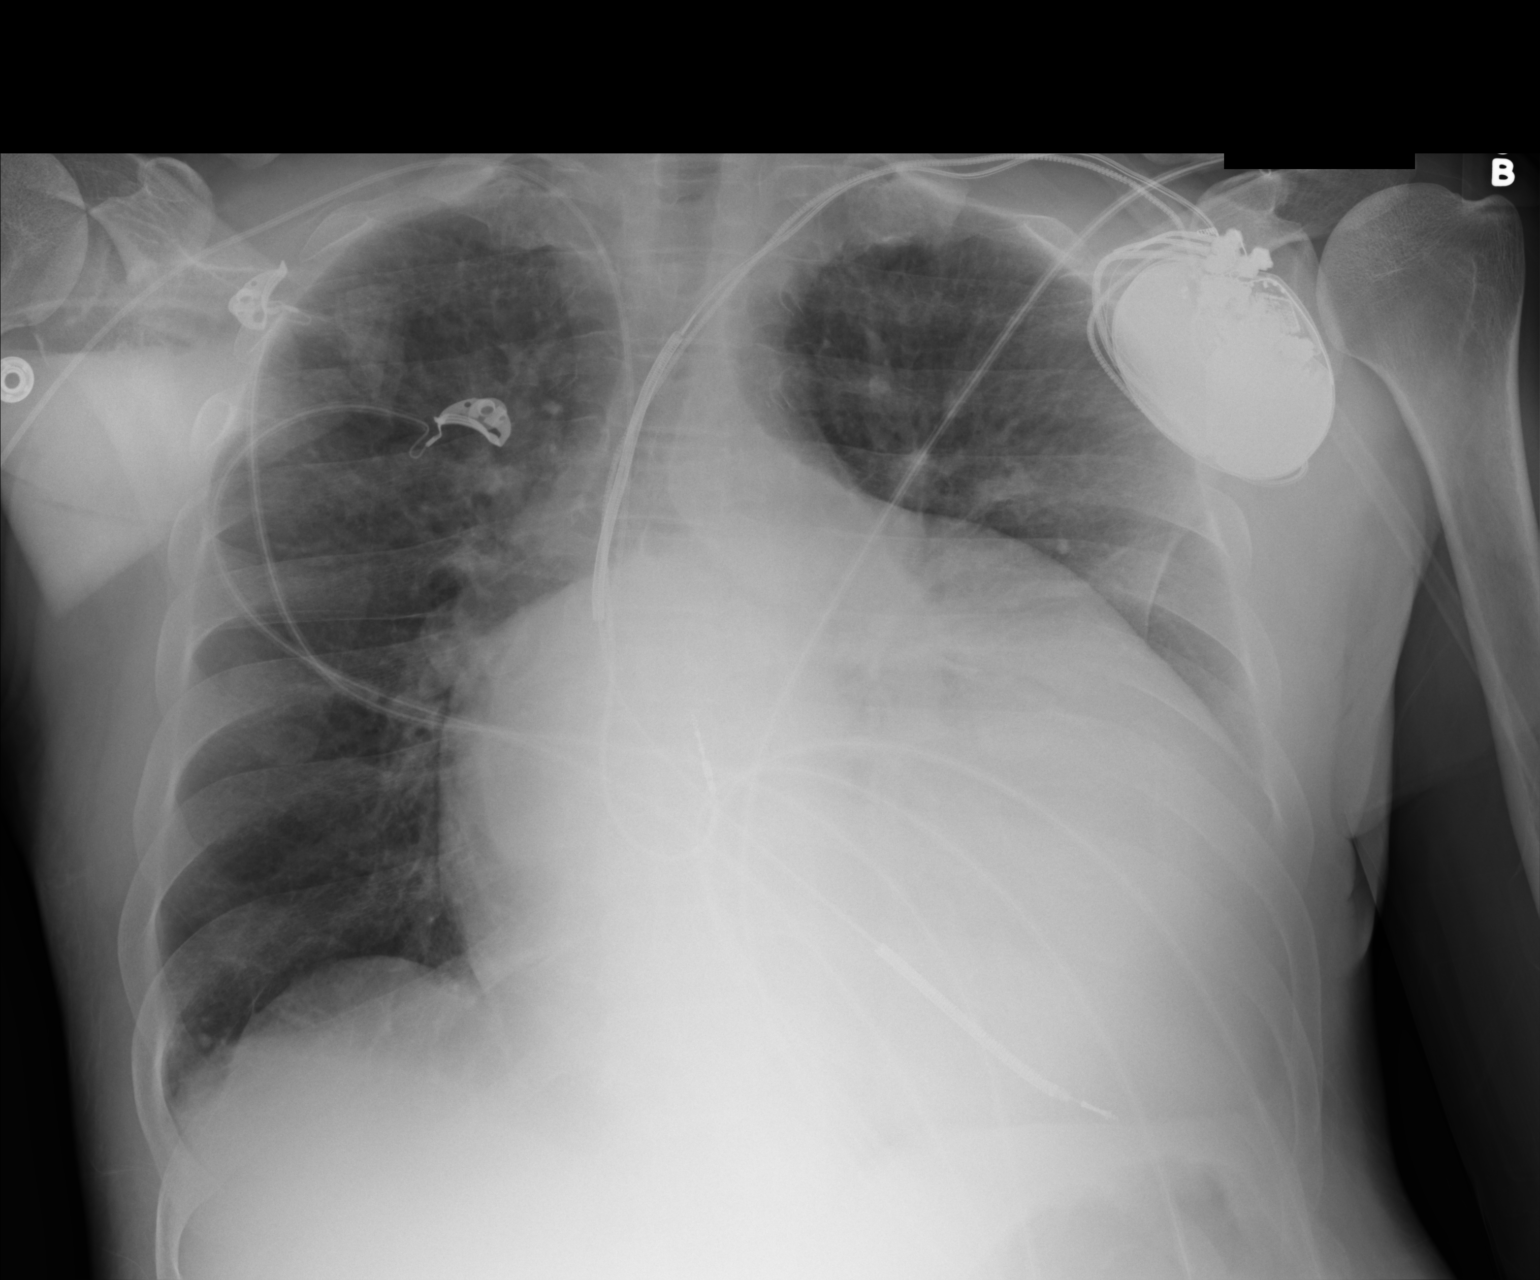

[1 of 1 positions shown; findings below may reference images not displayed]

FINDINGS: There is a new right upper extremity PICC line extending into the
low SVC. There is marked unchanged cardiomegaly. No airspace
consolidation. No large effusion. No pneumothorax.
IMPRESSION: Satisfactorily positioned right upper extremity PICC line. No other
significant interval changes.

## 2018-02-25 IMAGING — CR DG CHEST 1V
1 series · 1 of 1 positions shown · non-contrast
Comparison: 10/14/2016

CLINICAL DATA: Congestive heart failure, fever

EXAM:
CHEST 1 VIEW

[AP]
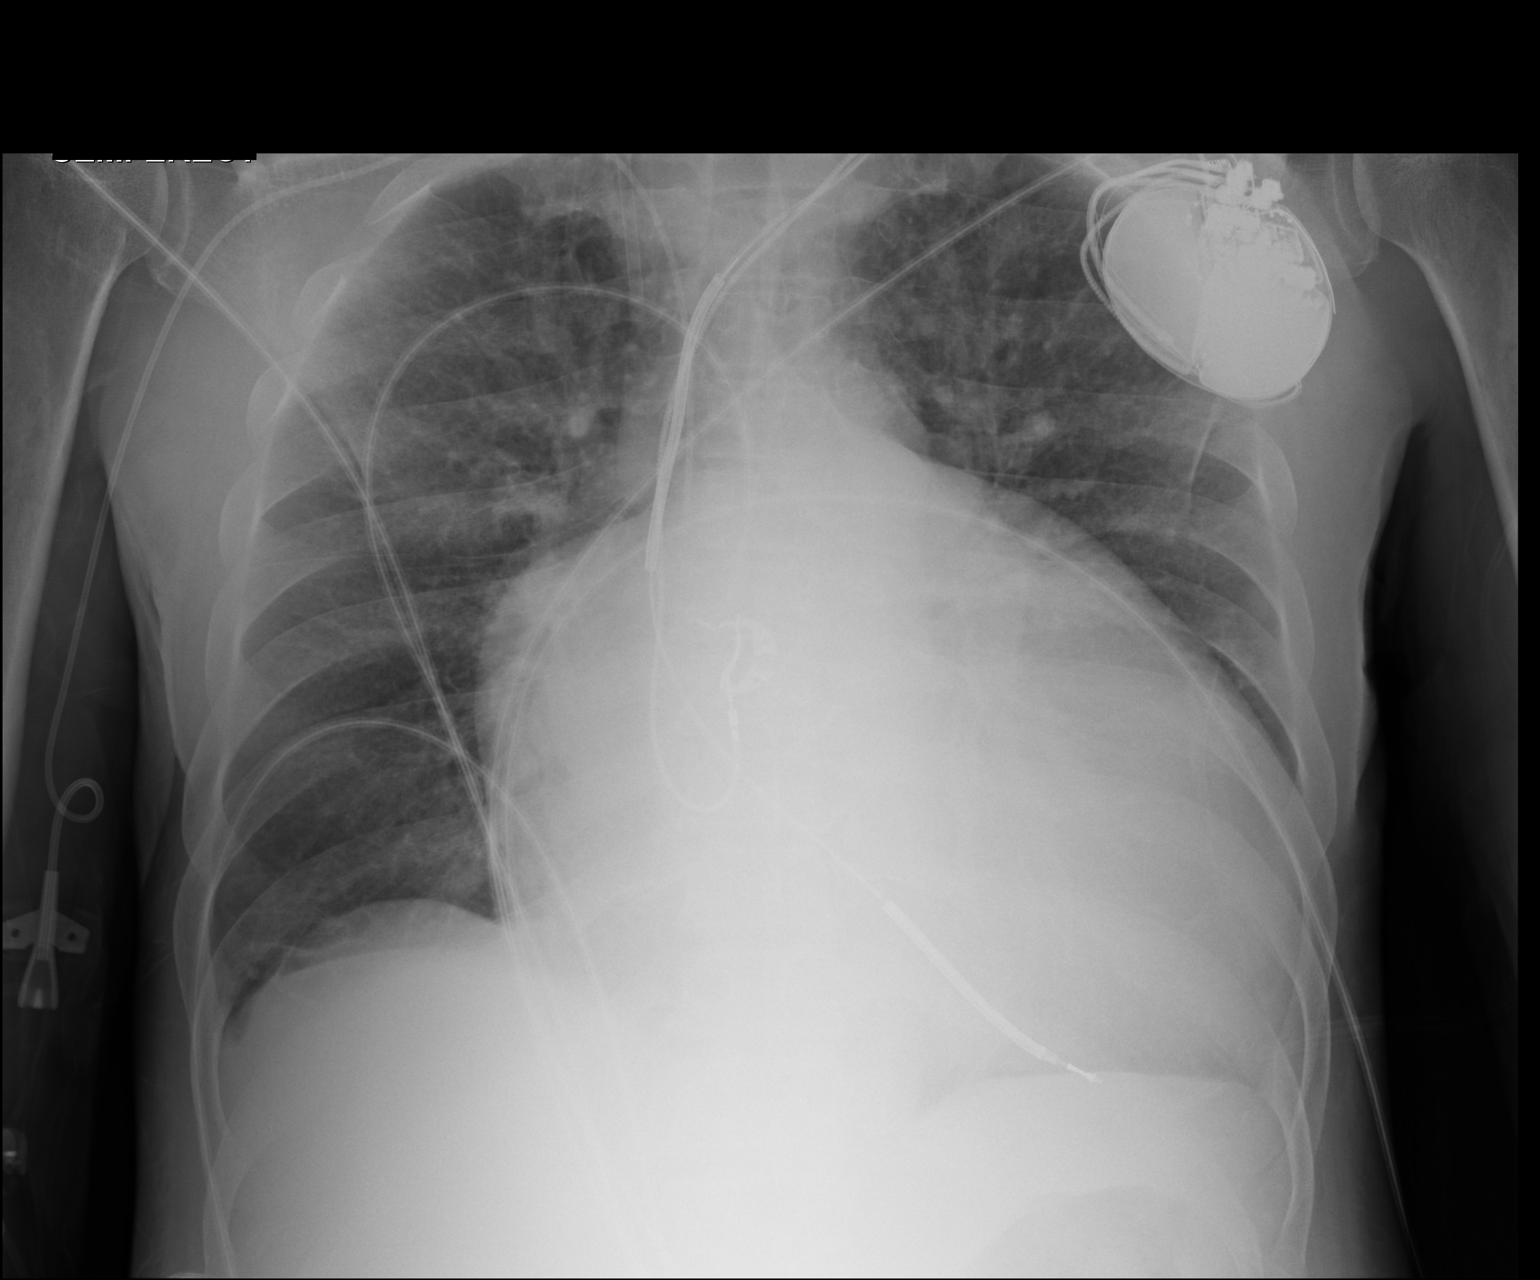

[1 of 1 positions shown; findings below may reference images not displayed]

FINDINGS: Significant cardiomegaly again noted. Dual lead cardiac pacemaker is
unchanged in position. Stable right arm PICC line position. Right IJ
sheath with tip in upper SVC. No infiltrate or pulmonary edema. Mild
left basilar atelectasis. Right Swan-Ganz catheter has been removed.
IMPRESSION: Cardiomegaly again noted. Dual lead cardiac pacemaker is unchanged
in position. Stable right arm PICC line position. No infiltrate or
pulmonary edema. Mild left basilar atelectasis.
